# Patient Record
Sex: Male | Born: 1940
Health system: Southern US, Community
[De-identification: ages and names within clinical notes are randomized; demographics above are authoritative.]

## PROBLEM LIST (undated history)

## (undated) ENCOUNTER — Ambulatory Visit: Admission: RE | Payer: Medicare Other | Source: Ambulatory Visit

## (undated) DIAGNOSIS — F528 Other sexual dysfunction not due to a substance or known physiological condition: Secondary | ICD-10-CM

## (undated) DIAGNOSIS — C4491 Basal cell carcinoma of skin, unspecified: Secondary | ICD-10-CM

## (undated) DIAGNOSIS — R001 Bradycardia, unspecified: Secondary | ICD-10-CM

## (undated) DIAGNOSIS — E669 Obesity, unspecified: Secondary | ICD-10-CM

## (undated) DIAGNOSIS — K922 Gastrointestinal hemorrhage, unspecified: Secondary | ICD-10-CM

## (undated) DIAGNOSIS — I2510 Atherosclerotic heart disease of native coronary artery without angina pectoris: Secondary | ICD-10-CM

## (undated) DIAGNOSIS — K5730 Diverticulosis of large intestine without perforation or abscess without bleeding: Secondary | ICD-10-CM

## (undated) DIAGNOSIS — R200 Anesthesia of skin: Secondary | ICD-10-CM

## (undated) DIAGNOSIS — K2270 Barrett's esophagus without dysplasia: Secondary | ICD-10-CM

## (undated) DIAGNOSIS — C44221 Squamous cell carcinoma of skin of unspecified ear and external auricular canal: Secondary | ICD-10-CM

## (undated) DIAGNOSIS — F329 Major depressive disorder, single episode, unspecified: Secondary | ICD-10-CM

## (undated) DIAGNOSIS — R011 Cardiac murmur, unspecified: Secondary | ICD-10-CM

## (undated) DIAGNOSIS — F411 Generalized anxiety disorder: Secondary | ICD-10-CM

## (undated) DIAGNOSIS — N410 Acute prostatitis: Secondary | ICD-10-CM

## (undated) DIAGNOSIS — I219 Acute myocardial infarction, unspecified: Secondary | ICD-10-CM

## (undated) DIAGNOSIS — N200 Calculus of kidney: Secondary | ICD-10-CM

## (undated) DIAGNOSIS — E785 Hyperlipidemia, unspecified: Secondary | ICD-10-CM

## (undated) DIAGNOSIS — F32A Depression, unspecified: Secondary | ICD-10-CM

## (undated) DIAGNOSIS — M1990 Unspecified osteoarthritis, unspecified site: Secondary | ICD-10-CM

## (undated) DIAGNOSIS — Z87442 Personal history of urinary calculi: Secondary | ICD-10-CM

## (undated) DIAGNOSIS — I639 Cerebral infarction, unspecified: Secondary | ICD-10-CM

## (undated) DIAGNOSIS — K219 Gastro-esophageal reflux disease without esophagitis: Secondary | ICD-10-CM

## (undated) HISTORY — DX: Generalized anxiety disorder: F41.1

## (undated) HISTORY — DX: Atherosclerotic heart disease of native coronary artery without angina pectoris: I25.10

## (undated) HISTORY — DX: Barrett's esophagus without dysplasia: K22.70

## (undated) HISTORY — PX: ESOPHAGOGASTRODUODENOSCOPY (EGD) WITH ESOPHAGEAL DILATION: SHX5812

## (undated) HISTORY — PX: BASAL CELL CARCINOMA EXCISION: SHX1214

## (undated) HISTORY — PX: URETERAL STENT PLACEMENT: SHX822

## (undated) HISTORY — DX: Diverticulosis of large intestine without perforation or abscess without bleeding: K57.30

## (undated) HISTORY — PX: UPPER GASTROINTESTINAL ENDOSCOPY: SHX188

## (undated) HISTORY — DX: Obesity, unspecified: E66.9

## (undated) HISTORY — DX: Hyperlipidemia, unspecified: E78.5

## (undated) HISTORY — DX: Gastrointestinal hemorrhage, unspecified: K92.2

## (undated) HISTORY — PX: LITHOTRIPSY: SUR834

## (undated) HISTORY — PX: SQUAMOUS CELL CARCINOMA EXCISION: SHX2433

## (undated) HISTORY — DX: Other sexual dysfunction not due to a substance or known physiological condition: F52.8

## (undated) HISTORY — PX: CATARACT EXTRACTION, BILATERAL: SHX1313

---
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
## 1969-08-30 HISTORY — PX: TONSILLECTOMY: SUR1361

## 1998-08-30 DIAGNOSIS — I219 Acute myocardial infarction, unspecified: Secondary | ICD-10-CM

## 1998-08-30 HISTORY — DX: Acute myocardial infarction, unspecified: I21.9

## 1998-09-06 HISTORY — PX: CORONARY ANGIOPLASTY WITH STENT PLACEMENT: SHX49

## 1998-12-26 ENCOUNTER — Encounter: Payer: Self-pay | Admitting: Internal Medicine

## 1998-12-26 LAB — CONVERTED CEMR LAB

## 2001-08-30 DIAGNOSIS — K2270 Barrett's esophagus without dysplasia: Secondary | ICD-10-CM

## 2001-08-30 DIAGNOSIS — K5730 Diverticulosis of large intestine without perforation or abscess without bleeding: Secondary | ICD-10-CM

## 2001-08-30 HISTORY — DX: Barrett's esophagus without dysplasia: K22.70

## 2001-08-30 HISTORY — DX: Diverticulosis of large intestine without perforation or abscess without bleeding: K57.30

## 2002-07-19 ENCOUNTER — Ambulatory Visit (HOSPITAL_COMMUNITY): Admission: RE | Admit: 2002-07-19 | Discharge: 2002-07-19 | Payer: Self-pay | Admitting: Gastroenterology

## 2002-07-19 ENCOUNTER — Encounter: Payer: Self-pay | Admitting: Gastroenterology

## 2002-07-19 ENCOUNTER — Encounter (INDEPENDENT_AMBULATORY_CARE_PROVIDER_SITE_OTHER): Payer: Self-pay | Admitting: *Deleted

## 2003-09-23 ENCOUNTER — Emergency Department (HOSPITAL_COMMUNITY): Admission: EM | Admit: 2003-09-23 | Discharge: 2003-09-23 | Payer: Self-pay | Admitting: Emergency Medicine

## 2004-08-07 ENCOUNTER — Ambulatory Visit: Payer: Self-pay | Admitting: Internal Medicine

## 2004-08-14 ENCOUNTER — Ambulatory Visit: Payer: Self-pay | Admitting: Internal Medicine

## 2004-08-21 ENCOUNTER — Ambulatory Visit: Payer: Self-pay | Admitting: Internal Medicine

## 2004-09-10 ENCOUNTER — Ambulatory Visit (HOSPITAL_COMMUNITY): Admission: RE | Admit: 2004-09-10 | Discharge: 2004-09-10 | Payer: Self-pay | Admitting: Urology

## 2004-10-02 ENCOUNTER — Ambulatory Visit: Payer: Self-pay | Admitting: Internal Medicine

## 2004-10-09 ENCOUNTER — Ambulatory Visit: Payer: Self-pay | Admitting: Internal Medicine

## 2005-01-29 ENCOUNTER — Ambulatory Visit: Payer: Self-pay | Admitting: Internal Medicine

## 2005-03-19 ENCOUNTER — Ambulatory Visit: Payer: Self-pay | Admitting: Internal Medicine

## 2005-04-09 ENCOUNTER — Ambulatory Visit: Payer: Self-pay | Admitting: Internal Medicine

## 2005-08-06 ENCOUNTER — Ambulatory Visit: Payer: Self-pay | Admitting: Internal Medicine

## 2005-08-13 ENCOUNTER — Ambulatory Visit: Payer: Self-pay | Admitting: Internal Medicine

## 2005-09-18 ENCOUNTER — Emergency Department (HOSPITAL_COMMUNITY): Admission: EM | Admit: 2005-09-18 | Discharge: 2005-09-18 | Payer: Self-pay | Admitting: Family Medicine

## 2005-10-29 ENCOUNTER — Ambulatory Visit: Payer: Self-pay | Admitting: Internal Medicine

## 2005-11-05 ENCOUNTER — Ambulatory Visit: Payer: Self-pay | Admitting: Internal Medicine

## 2006-03-11 ENCOUNTER — Ambulatory Visit: Payer: Self-pay | Admitting: Internal Medicine

## 2006-03-18 ENCOUNTER — Ambulatory Visit: Payer: Self-pay | Admitting: Internal Medicine

## 2006-06-10 ENCOUNTER — Ambulatory Visit: Payer: Self-pay | Admitting: Internal Medicine

## 2006-06-10 LAB — CONVERTED CEMR LAB
ALT: 27 units/L (ref 0–40)
AST: 25 units/L (ref 0–37)
Albumin: 3.8 g/dL (ref 3.5–5.2)
Alkaline Phosphatase: 77 units/L (ref 39–117)
BUN: 11 mg/dL (ref 6–23)
Bilirubin, Direct: 0.2 mg/dL (ref 0.0–0.3)
CO2: 27 meq/L (ref 19–32)
Calcium: 9.3 mg/dL (ref 8.4–10.5)
Chloride: 108 meq/L (ref 96–112)
Chol/HDL Ratio, serum: 4.4
Cholesterol: 132 mg/dL (ref 0–200)
Creatinine, Ser: 1 mg/dL (ref 0.4–1.5)
GFR calc non Af Amer: 80 mL/min
Glomerular Filtration Rate, Af Am: 97 mL/min/{1.73_m2}
Glucose, Bld: 96 mg/dL (ref 70–99)
HDL: 30.3 mg/dL — ABNORMAL LOW (ref >39.0)
LDL Cholesterol: 91 mg/dL (ref 0–99)
Potassium: 4.7 meq/L (ref 3.5–5.1)
Sodium: 140 meq/L (ref 135–145)
Total Bilirubin: 1.1 mg/dL (ref 0.3–1.2)
Total Protein: 6.4 g/dL (ref 6.0–8.3)
Triglyceride fasting, serum: 54 mg/dL (ref 0–149)
VLDL: 11 mg/dL (ref 0–40)

## 2006-12-02 ENCOUNTER — Ambulatory Visit: Payer: Self-pay | Admitting: Internal Medicine

## 2006-12-07 ENCOUNTER — Ambulatory Visit: Payer: Self-pay | Admitting: Gastroenterology

## 2006-12-07 LAB — CONVERTED CEMR LAB
Basophils Absolute: 0 10*3/uL (ref 0.0–0.1)
Basophils Relative: 0.7 % (ref 0.0–1.0)
Eosinophils Absolute: 0.1 10*3/uL (ref 0.0–0.6)
Eosinophils Relative: 1.3 % (ref 0.0–5.0)
HCT: 38.8 % — ABNORMAL LOW (ref 39.0–52.0)
Hemoglobin: 13.7 g/dL (ref 13.0–17.0)
Lymphocytes Relative: 27.1 % (ref 12.0–46.0)
MCHC: 35.3 g/dL (ref 30.0–36.0)
MCV: 86.4 fL (ref 78.0–100.0)
Monocytes Absolute: 0.7 10*3/uL (ref 0.2–0.7)
Monocytes Relative: 13.2 % — ABNORMAL HIGH (ref 3.0–11.0)
Neutro Abs: 3.1 10*3/uL (ref 1.4–7.7)
Neutrophils Relative %: 57.7 % (ref 43.0–77.0)
Platelets: 219 10*3/uL (ref 150–400)
RBC: 4.49 M/uL (ref 4.22–5.81)
RDW: 12.3 % (ref 11.5–14.6)
WBC: 5.3 10*3/uL (ref 4.5–10.5)

## 2006-12-29 ENCOUNTER — Ambulatory Visit: Payer: Self-pay | Admitting: Gastroenterology

## 2006-12-29 ENCOUNTER — Encounter (INDEPENDENT_AMBULATORY_CARE_PROVIDER_SITE_OTHER): Payer: Self-pay | Admitting: Specialist

## 2006-12-29 LAB — HM COLONOSCOPY: HM Colonoscopy: ABNORMAL

## 2007-01-27 ENCOUNTER — Ambulatory Visit: Payer: Self-pay | Admitting: Internal Medicine

## 2007-01-27 LAB — CONVERTED CEMR LAB
ALT: 20 units/L (ref 0–40)
AST: 20 units/L (ref 0–37)
Albumin: 3.5 g/dL (ref 3.5–5.2)
Alkaline Phosphatase: 74 units/L (ref 39–117)
BUN: 15 mg/dL (ref 6–23)
Basophils Absolute: 0 10*3/uL (ref 0.0–0.1)
Basophils Relative: 0.3 % (ref 0.0–1.0)
Bilirubin, Direct: 0.1 mg/dL (ref 0.0–0.3)
CO2: 26 meq/L (ref 19–32)
Calcium: 9 mg/dL (ref 8.4–10.5)
Chloride: 112 meq/L (ref 96–112)
Cholesterol: 185 mg/dL (ref 0–200)
Creatinine, Ser: 0.7 mg/dL (ref 0.4–1.5)
Eosinophils Absolute: 0.1 10*3/uL (ref 0.0–0.6)
Eosinophils Relative: 3.1 % (ref 0.0–5.0)
GFR calc Af Amer: 146 mL/min
GFR calc non Af Amer: 121 mL/min
Glucose, Bld: 96 mg/dL (ref 70–99)
HCT: 42.6 % (ref 39.0–52.0)
HDL: 33.1 mg/dL — ABNORMAL LOW (ref >39.0)
Hemoglobin: 14.9 g/dL (ref 13.0–17.0)
LDL Cholesterol: 133 mg/dL — ABNORMAL HIGH (ref 0–99)
Lymphocytes Relative: 33 % (ref 12.0–46.0)
MCHC: 35 g/dL (ref 30.0–36.0)
MCV: 87.1 fL (ref 78.0–100.0)
Monocytes Absolute: 0.6 10*3/uL (ref 0.2–0.7)
Monocytes Relative: 16.7 % — ABNORMAL HIGH (ref 3.0–11.0)
Neutro Abs: 1.6 10*3/uL (ref 1.4–7.7)
Neutrophils Relative %: 46.9 % (ref 43.0–77.0)
PSA: 1.63 ng/mL (ref 0.10–4.00)
Platelets: 201 10*3/uL (ref 150–400)
Potassium: 4.1 meq/L (ref 3.5–5.1)
RBC: 4.89 M/uL (ref 4.22–5.81)
RDW: 12.3 % (ref 11.5–14.6)
Sodium: 142 meq/L (ref 135–145)
Total Bilirubin: 0.9 mg/dL (ref 0.3–1.2)
Total CHOL/HDL Ratio: 5.6
Total Protein: 6.6 g/dL (ref 6.0–8.3)
Triglycerides: 93 mg/dL (ref 0–149)
VLDL: 19 mg/dL (ref 0–40)
WBC: 3.5 10*3/uL — ABNORMAL LOW (ref 4.5–10.5)

## 2007-02-07 ENCOUNTER — Ambulatory Visit: Payer: Self-pay | Admitting: Internal Medicine

## 2007-02-09 ENCOUNTER — Encounter: Payer: Self-pay | Admitting: Internal Medicine

## 2007-02-09 DIAGNOSIS — I2510 Atherosclerotic heart disease of native coronary artery without angina pectoris: Secondary | ICD-10-CM | POA: Insufficient documentation

## 2007-02-09 DIAGNOSIS — K2270 Barrett's esophagus without dysplasia: Secondary | ICD-10-CM | POA: Insufficient documentation

## 2007-02-09 DIAGNOSIS — E785 Hyperlipidemia, unspecified: Secondary | ICD-10-CM | POA: Insufficient documentation

## 2007-02-09 DIAGNOSIS — K5730 Diverticulosis of large intestine without perforation or abscess without bleeding: Secondary | ICD-10-CM

## 2007-02-09 DIAGNOSIS — Z87442 Personal history of urinary calculi: Secondary | ICD-10-CM | POA: Insufficient documentation

## 2007-02-09 DIAGNOSIS — I252 Old myocardial infarction: Secondary | ICD-10-CM

## 2007-02-09 HISTORY — DX: Hyperlipidemia, unspecified: E78.5

## 2007-05-31 ENCOUNTER — Telehealth: Payer: Self-pay | Admitting: Internal Medicine

## 2007-08-10 ENCOUNTER — Ambulatory Visit: Payer: Self-pay | Admitting: Internal Medicine

## 2007-08-11 LAB — CONVERTED CEMR LAB
ALT: 29 units/L (ref 0–53)
AST: 23 units/L (ref 0–37)
Albumin: 3.7 g/dL (ref 3.5–5.2)
Alkaline Phosphatase: 85 units/L (ref 39–117)
BUN: 14 mg/dL (ref 6–23)
Basophils Absolute: 0 10*3/uL (ref 0.0–0.1)
Basophils Relative: 0.6 % (ref 0.0–1.0)
Bilirubin, Direct: 0.2 mg/dL (ref 0.0–0.3)
CO2: 29 meq/L (ref 19–32)
Calcium: 9.3 mg/dL (ref 8.4–10.5)
Chloride: 105 meq/L (ref 96–112)
Cholesterol: 193 mg/dL (ref 0–200)
Creatinine, Ser: 1 mg/dL (ref 0.4–1.5)
Eosinophils Absolute: 0.1 10*3/uL (ref 0.0–0.6)
Eosinophils Relative: 3 % (ref 0.0–5.0)
GFR calc Af Amer: 97 mL/min
GFR calc non Af Amer: 80 mL/min
Glucose, Bld: 98 mg/dL (ref 70–99)
HCT: 45 % (ref 39.0–52.0)
HDL: 29.1 mg/dL — ABNORMAL LOW (ref >39.0)
Hemoglobin: 15.6 g/dL (ref 13.0–17.0)
LDL Cholesterol: 143 mg/dL — ABNORMAL HIGH (ref 0–99)
Lymphocytes Relative: 29.4 % (ref 12.0–46.0)
MCHC: 34.7 g/dL (ref 30.0–36.0)
MCV: 87.2 fL (ref 78.0–100.0)
Monocytes Absolute: 0.9 10*3/uL — ABNORMAL HIGH (ref 0.2–0.7)
Monocytes Relative: 17.9 % — ABNORMAL HIGH (ref 3.0–11.0)
Neutro Abs: 2.5 10*3/uL (ref 1.4–7.7)
Neutrophils Relative %: 49.1 % (ref 43.0–77.0)
Platelets: 221 10*3/uL (ref 150–400)
Potassium: 4.6 meq/L (ref 3.5–5.1)
RBC: 5.16 M/uL (ref 4.22–5.81)
RDW: 12.7 % (ref 11.5–14.6)
Sodium: 140 meq/L (ref 135–145)
Total Bilirubin: 0.9 mg/dL (ref 0.3–1.2)
Total CHOL/HDL Ratio: 6.6
Total Protein: 6.6 g/dL (ref 6.0–8.3)
Triglycerides: 104 mg/dL (ref 0–149)
VLDL: 21 mg/dL (ref 0–40)
WBC: 5 10*3/uL (ref 4.5–10.5)

## 2007-08-15 ENCOUNTER — Ambulatory Visit: Payer: Self-pay | Admitting: Internal Medicine

## 2007-10-12 ENCOUNTER — Ambulatory Visit: Payer: Self-pay | Admitting: Internal Medicine

## 2007-10-12 LAB — CONVERTED CEMR LAB
ALT: 24 units/L (ref 0–53)
Albumin: 3.5 g/dL (ref 3.5–5.2)
Alkaline Phosphatase: 86 units/L (ref 39–117)
Cholesterol: 134 mg/dL (ref 0–200)
LDL Cholesterol: 85 mg/dL (ref 0–99)
Total CHOL/HDL Ratio: 4.9
Total Protein: 6.7 g/dL (ref 6.0–8.3)

## 2007-10-16 ENCOUNTER — Telehealth: Payer: Self-pay | Admitting: Internal Medicine

## 2008-02-27 ENCOUNTER — Ambulatory Visit: Payer: Self-pay | Admitting: Internal Medicine

## 2008-02-27 LAB — CONVERTED CEMR LAB
ALT: 22 units/L (ref 0–53)
Alkaline Phosphatase: 74 units/L (ref 39–117)
Basophils Absolute: 0 10*3/uL (ref 0.0–0.1)
Bilirubin, Direct: 0.1 mg/dL (ref 0.0–0.3)
CO2: 26 meq/L (ref 19–32)
Calcium: 8.8 mg/dL (ref 8.4–10.5)
Cholesterol: 129 mg/dL (ref 0–200)
GFR calc Af Amer: 87 mL/min
HDL: 30.8 mg/dL — ABNORMAL LOW (ref >39.0)
Hemoglobin: 15.1 g/dL (ref 13.0–17.0)
LDL Cholesterol: 84 mg/dL (ref 0–99)
Lymphocytes Relative: 30.4 % (ref 12.0–46.0)
MCHC: 34.9 g/dL (ref 30.0–36.0)
Monocytes Relative: 17.9 % — ABNORMAL HIGH (ref 3.0–12.0)
Neutro Abs: 1.8 10*3/uL (ref 1.4–7.7)
Nitrite: NEGATIVE
Platelets: 171 10*3/uL (ref 150–400)
Potassium: 3.8 meq/L (ref 3.5–5.1)
RDW: 12.9 % (ref 11.5–14.6)
Sodium: 143 meq/L (ref 135–145)
Total Bilirubin: 1 mg/dL (ref 0.3–1.2)
Total CHOL/HDL Ratio: 4.2
Triglycerides: 72 mg/dL (ref 0–149)
Urobilinogen, UA: 1
VLDL: 14 mg/dL (ref 0–40)

## 2008-03-06 ENCOUNTER — Ambulatory Visit: Payer: Self-pay | Admitting: Internal Medicine

## 2008-08-20 ENCOUNTER — Ambulatory Visit: Payer: Self-pay | Admitting: Internal Medicine

## 2008-08-20 LAB — CONVERTED CEMR LAB
Albumin: 3.6 g/dL (ref 3.5–5.2)
Cholesterol: 152 mg/dL (ref 0–200)
LDL Cholesterol: 99 mg/dL (ref 0–99)
Total CHOL/HDL Ratio: 4.7
Total Protein: 6.5 g/dL (ref 6.0–8.3)
Triglycerides: 104 mg/dL (ref 0–149)
VLDL: 21 mg/dL (ref 0–40)

## 2008-08-22 ENCOUNTER — Ambulatory Visit: Payer: Self-pay | Admitting: Internal Medicine

## 2008-08-22 DIAGNOSIS — F528 Other sexual dysfunction not due to a substance or known physiological condition: Secondary | ICD-10-CM

## 2008-08-22 DIAGNOSIS — F411 Generalized anxiety disorder: Secondary | ICD-10-CM

## 2008-08-22 HISTORY — DX: Generalized anxiety disorder: F41.1

## 2008-08-22 HISTORY — DX: Other sexual dysfunction not due to a substance or known physiological condition: F52.8

## 2008-09-25 ENCOUNTER — Encounter: Payer: Self-pay | Admitting: Internal Medicine

## 2008-10-17 ENCOUNTER — Telehealth: Payer: Self-pay | Admitting: Internal Medicine

## 2008-11-22 ENCOUNTER — Encounter (INDEPENDENT_AMBULATORY_CARE_PROVIDER_SITE_OTHER): Payer: Self-pay | Admitting: *Deleted

## 2009-02-13 ENCOUNTER — Ambulatory Visit: Payer: Self-pay | Admitting: Internal Medicine

## 2009-02-13 LAB — CONVERTED CEMR LAB
ALT: 22 units/L (ref 0–53)
AST: 23 units/L (ref 0–37)
Albumin: 3.7 g/dL (ref 3.5–5.2)
Calcium: 8.8 mg/dL (ref 8.4–10.5)
GFR calc non Af Amer: 89.88 mL/min (ref >60)
HDL: 34.9 mg/dL — ABNORMAL LOW (ref >39.00)
Sodium: 140 meq/L (ref 135–145)
Total Protein: 6.8 g/dL (ref 6.0–8.3)
Triglycerides: 88 mg/dL (ref 0.0–149.0)

## 2009-02-20 ENCOUNTER — Ambulatory Visit: Payer: Self-pay | Admitting: Internal Medicine

## 2009-02-26 ENCOUNTER — Telehealth: Payer: Self-pay | Admitting: Internal Medicine

## 2009-03-05 ENCOUNTER — Telehealth: Payer: Self-pay | Admitting: Internal Medicine

## 2009-04-18 ENCOUNTER — Ambulatory Visit: Payer: Self-pay | Admitting: Internal Medicine

## 2009-04-18 LAB — CONVERTED CEMR LAB
Albumin: 3.7 g/dL (ref 3.5–5.2)
Alkaline Phosphatase: 78 units/L (ref 39–117)
Basophils Absolute: 0 10*3/uL (ref 0.0–0.1)
Chloride: 115 meq/L — ABNORMAL HIGH (ref 96–112)
Cholesterol: 135 mg/dL (ref 0–200)
Creatinine, Ser: 0.9 mg/dL (ref 0.4–1.5)
Eosinophils Absolute: 0.1 10*3/uL (ref 0.0–0.7)
Hemoglobin: 14.9 g/dL (ref 13.0–17.0)
LDL Cholesterol: 87 mg/dL (ref 0–99)
Leukocytes, UA: NEGATIVE
Lymphocytes Relative: 29.4 % (ref 12.0–46.0)
Lymphs Abs: 1.3 10*3/uL (ref 0.7–4.0)
MCHC: 34 g/dL (ref 30.0–36.0)
Neutro Abs: 2.3 10*3/uL (ref 1.4–7.7)
Nitrite: NEGATIVE
PSA: 1.38 ng/mL (ref 0.10–4.00)
Potassium: 4.3 meq/L (ref 3.5–5.1)
RDW: 12.4 % (ref 11.5–14.6)
Sodium: 145 meq/L (ref 135–145)
Specific Gravity, Urine: >=1.03 (ref 1.000–1.030)
Triglycerides: 70 mg/dL (ref 0.0–149.0)
Urobilinogen, UA: 1 (ref 0.0–1.0)

## 2009-04-22 ENCOUNTER — Ambulatory Visit: Payer: Self-pay | Admitting: Internal Medicine

## 2009-08-25 ENCOUNTER — Telehealth: Payer: Self-pay | Admitting: Internal Medicine

## 2009-09-18 ENCOUNTER — Telehealth: Payer: Self-pay | Admitting: Gastroenterology

## 2009-10-01 ENCOUNTER — Encounter (INDEPENDENT_AMBULATORY_CARE_PROVIDER_SITE_OTHER): Payer: Self-pay | Admitting: *Deleted

## 2009-10-28 ENCOUNTER — Ambulatory Visit: Payer: Self-pay | Admitting: Internal Medicine

## 2009-10-28 LAB — CONVERTED CEMR LAB
AST: 24 units/L (ref 0–37)
Albumin: 3.7 g/dL (ref 3.5–5.2)
Alkaline Phosphatase: 77 units/L (ref 39–117)
Cholesterol: 146 mg/dL (ref 0–200)
Total Protein: 7 g/dL (ref 6.0–8.3)
Triglycerides: 97 mg/dL (ref 0.0–149.0)

## 2009-11-03 ENCOUNTER — Ambulatory Visit: Payer: Self-pay | Admitting: Internal Medicine

## 2009-11-06 ENCOUNTER — Encounter (INDEPENDENT_AMBULATORY_CARE_PROVIDER_SITE_OTHER): Payer: Self-pay | Admitting: *Deleted

## 2009-11-07 ENCOUNTER — Ambulatory Visit: Payer: Self-pay | Admitting: Gastroenterology

## 2009-11-21 ENCOUNTER — Ambulatory Visit: Payer: Self-pay | Admitting: Gastroenterology

## 2009-11-27 ENCOUNTER — Encounter: Payer: Self-pay | Admitting: Gastroenterology

## 2009-12-26 ENCOUNTER — Encounter: Payer: Self-pay | Admitting: Internal Medicine

## 2010-01-21 ENCOUNTER — Encounter: Payer: Self-pay | Admitting: Internal Medicine

## 2010-01-27 ENCOUNTER — Ambulatory Visit: Payer: Self-pay | Admitting: Internal Medicine

## 2010-01-28 ENCOUNTER — Encounter: Payer: Self-pay | Admitting: Internal Medicine

## 2010-02-23 ENCOUNTER — Telehealth: Payer: Self-pay | Admitting: Family Medicine

## 2010-03-09 ENCOUNTER — Ambulatory Visit: Payer: Self-pay | Admitting: Internal Medicine

## 2010-03-09 LAB — CONVERTED CEMR LAB
ALT: 23 units/L (ref 0–53)
AST: 21 units/L (ref 0–37)
Bilirubin, Direct: 0.2 mg/dL (ref 0.0–0.3)
Total Bilirubin: 0.8 mg/dL (ref 0.3–1.2)

## 2010-03-20 ENCOUNTER — Ambulatory Visit: Payer: Self-pay | Admitting: Internal Medicine

## 2010-03-20 DIAGNOSIS — E669 Obesity, unspecified: Secondary | ICD-10-CM

## 2010-03-20 HISTORY — DX: Obesity, unspecified: E66.9

## 2010-09-14 ENCOUNTER — Ambulatory Visit: Admit: 2010-09-14 | Payer: Self-pay | Admitting: Internal Medicine

## 2010-09-29 NOTE — Miscellaneous (Signed)
Summary: [facility]  Clinical Lists Changes  Allergies: Added new allergy or adverse reaction of LIPITOR

## 2010-09-29 NOTE — Letter (Signed)
Summary: Patient Notice-[facility]  [address] [place], [place] [address]   Phone: [phone]  Fax: [phone]        November 27, 2009 MRN: [id number]    KAINOA SWOBODA [address] [place], [place]  [address]    Dear Mr. Bernabei,  I am pleased to inform you that the biopsies taken during your recent endoscopic examination did not show any evidence of cancer upon pathologic examination.  However, your biopsies indicate you have a condition known as Barrett's esophagus. While not cancer, it is pre-cancerous (can progress to cancer) and needs to be monitored with repeat endoscopic examination and biopsies.  Fortunately, it is quite rare that this develops into cancer, but careful monitoring of the condition along with taking your medication as prescribed is important in reducing the risk of developing cancer.  It is my recommendation that you have a repeat upper gastrointestinal endoscopic examination in 3 years.  Continue with treatment plan as outlined the day of your exam.  Please call us if you have or develop heartburn, reflux symptoms, any swallowing problems, or if you have questions about your condition that have not been fully answered at this time.  Sincerely,  Meryl Dare MD [facility]  This letter has been electronically signed by your physician.  Appended Document: Patient Notice-Barrett's Esopghagus Letter mailed 4.1.11

## 2010-09-29 NOTE — Assessment & Plan Note (Signed)
Summary: 6 MNTH ROV//SLM/PT RESCD FROM BUMP//CCM   Vital Signs:  Patient profile:   70 year old male Weight:      205 pounds Temp:     97.3 degrees F oral Pulse rate:   54 / minute Pulse rhythm:   regular Resp:     14 per minute BP sitting:   118 / 70  (left arm) Cuff size:   regular  Vitals Entered By: Gladis Riffle, RN (November 03, 2009 3:25 PM) CC: 6 month rov, c/o myalgia from lipitor--labs done Is Patient Diabetic? No   CC:  6 month rov and c/o myalgia from lipitor--labs done.  History of Present Illness:  Follow-Up Visit      This is a 70 year old man who presents for Follow-up visit.  The patient denies chest pain and palpitations.  Since the last visit the patient notes no new problems or concerns.  The patient reports taking meds as prescribed.  When questioned about possible medication side effects, the patient notes muscle aches.  --- says leg pain---associated with lipitor. he has done his own trial of stopping and starting meds on his own and is onvinced that meds are the cause  All other systems reviewed and were negative   Preventive Screening-Counseling & Management  Alcohol-Tobacco     Smoking Status: quit     Year Quit: 1994  Current Medications (verified): 1)  Alprazolam 0.25 Mg Tabs (Alprazolam) .... Take 1 Tablet By Mouth Once A Day 2)  Aspir-81 81 Mg Tbec (Aspirin) .... Take 1 Tablet By Mouth Once A Day 3)  Atenolol 25 Mg Tabs (Atenolol) .... Take 1/2 Tablet By Mouth Once A Day 4)  Cialis 20 Mg Tabs (Tadalafil) .Marland Kitchen.. 1 Tablet Every Other Day As Needed For Erectile Dysfunction 5)  Omeprazole 20 Mg Cpdr (Omeprazole) .... Take 1 Capsule By Mouth Every Morning 6)  Lipitor 20 Mg Tabs (Atorvastatin Calcium) .... Take 1 Tab By Mouth Daily or As Directed  Allergies: 1)  ! Zocor (Simvastatin)  Physical Exam  General:  Well-developed,well-nourished,in no acute distress; alert,appropriate and cooperative throughout examination Head:  normocephalic and atraumatic.    Eyes:  pupils equal and pupils round.   Neck:  No deformities, masses, or tenderness noted. Chest Wall:  No deformities, masses, tenderness or gynecomastia noted. Lungs:  Normal respiratory effort, chest expands symmetrically. Lungs are clear to auscultation, no crackles or wheezes. Heart:  Normal rate and regular rhythm. S1 and S2 normal without gallop, murmur, click, rub or other extra sounds. Abdomen:  soft and non-tender.   Msk:  No deformity or scoliosis noted of thoracic or lumbar spine.   Neurologic:  cranial nerves II-XII intact and gait normal.   Skin:  turgor normal and color normal.   Psych:  memory intact for recent and remote and normally interactive.     Impression & Recommendations:  Problem # 1:  HYPERLIPIDEMIA (ICD-272.4) controlled but having side effects on meds see pt instructions The following medications were removed from the medication list:    Lipitor 20 Mg Tabs (Atorvastatin calcium) .Marland Kitchen... Take 1 tab by mouth daily or as directed His updated medication list for this problem includes:    Livalo 2 Mg Tabs (Pitavastatin calcium) .Marland Kitchen... 1/2 by mouth every other day (he is taking 1/2 every other day)  Labs Reviewed: SGOT: 24 (10/28/2009)   SGPT: 23 (10/28/2009)   HDL:43.00 (10/28/2009), 33.80 (04/18/2009)  LDL:84 (10/28/2009), 87 (04/18/2009)  Chol:146 (10/28/2009), 135 (04/18/2009)  Trig:97.0 (10/28/2009), 70.0 (  04/18/2009)  Problem # 2:  CORONARY ARTERY DISEASE (ICD-414.00) risk factor modification His updated medication list for this problem includes:    Aspir-81 81 Mg Tbec (Aspirin) .Marland Kitchen... Take 1 tablet by mouth once a day    Atenolol 25 Mg Tabs (Atenolol) .Marland Kitchen... Take 1/2 tablet by mouth once a day  Labs Reviewed: Chol: 146 (10/28/2009)   HDL: 43.00 (10/28/2009)   LDL: 84 (10/28/2009)   TG: 97.0 (10/28/2009)  Problem # 3:  ERECTILE DYSFUNCTION (ICD-302.72) he may consider a pump device His updated medication list for this problem includes:    Cialis 20 Mg  Tabs (Tadalafil) .Marland Kitchen... 1 tablet every other day as needed for erectile dysfunction  Complete Medication List: 1)  Alprazolam 0.25 Mg Tabs (Alprazolam) .... Take 1 tablet by mouth once a day 2)  Aspir-81 81 Mg Tbec (Aspirin) .... Take 1 tablet by mouth once a day 3)  Atenolol 25 Mg Tabs (Atenolol) .... Take 1/2 tablet by mouth once a day 4)  Cialis 20 Mg Tabs (Tadalafil) .Marland Kitchen.. 1 tablet every other day as needed for erectile dysfunction 5)  Omeprazole 20 Mg Cpdr (Omeprazole) .... Take 1 capsule by mouth every morning 6)  Livalo 2 Mg Tabs (Pitavastatin calcium) .... 1/2 by mouth every other day  Patient Instructions: 1)  stop lipitor for  5 weeks then start livalo 2 mg 1/2 by mouth every other day

## 2010-09-29 NOTE — Assessment & Plan Note (Signed)
Summary: 6 WK F/U // RS/pt rescd from bump//ccm   Vital Signs:  Patient profile:   70 year old male Weight:      210.5 pounds BMI:     32.12 Temp:     97.7 degrees F oral Pulse rate:   60 / minute Pulse rhythm:   regular Resp:     12 per minute BP sitting:   112 / 70  (left arm) Cuff size:   regular  Vitals Entered By: Gladis Riffle, RN (March 20, 2010 7:53 AM) CC: 6 week rov, labs done--c/o foot cramping and thinks may be from simvastatin Is Patient Diabetic? No   CC:  6 week rov and labs done--c/o foot cramping and thinks may be from simvastatin.  History of Present Illness:  Follow-Up Visit      This is a 70 year old Jose [name] who presents for Follow-up visit.  The patient denies chest pain and palpitations.  Since the last visit the patient notes no new problems or concerns.  The patient reports taking meds as prescribed.  When questioned about possible medication side effects, the patient notes none.    All other systems reviewed and were negative   Preventive Screening-Counseling & Management  Alcohol-Tobacco     Smoking Status: quit     Year Quit: 1994  Current Problems (verified): 1)  Erectile Dysfunction  (ICD-302.72) 2)  Anxiety  (ICD-300.00) 3)  Physical Examination  (ICD-V70.0) 4)  Barrett's Esophagus  (ICD-530.85) 5)  Nephrolithiasis, Hx of  (ICD-V13.01) 6)  Myocardial Infarction, Hx of  (ICD-412) 7)  Hyperlipidemia  (ICD-272.4) 8)  Diverticulosis, Colon  (ICD-562.10) 9)  Coronary Artery Disease  (ICD-414.00)  Current Medications (verified): 1)  Alprazolam 0.25 Mg Tabs (Alprazolam) .... Take 1 Tablet By Mouth Once A Day 2)  Aspir-81 81 Mg Tbec (Aspirin) .... Take 1 Tablet By Mouth Once A Day 3)  Atenolol 25 Mg Tabs (Atenolol) .... Take 1/2 Tablet By Mouth Once A Day 4)  Cialis 20 Mg Tabs (Tadalafil) .Marland Kitchen.. 1 Tablet Every Other Day As Needed For Erectile Dysfunction 5)  Omeprazole 20 Mg Cpdr (Omeprazole) .... Take 1 Capsule By Mouth Every Morning 6)  Simvastatin 20  Mg Tabs (Simvastatin) .Marland Kitchen.. 1 By Mouth At Bedtime  Allergies: 1)  ! Lipitor  Past History:  Past Medical History: Last updated: 08-28-07 Coronary artery disease Diverticulosis, colon Hyperlipidemia Myocardial infarction, hx of-stent Nephrolithiasis, hx of Barrett's esophagus hiatal hernia Anxiety  Past Surgical History: Last updated: 02/09/2007 lithotripsy T & A  Family History: Last updated: 08/28/07 mother deceased with "blood clot" father deceased - alcohol liver disease brother CABG  Social History: Last updated: 04/22/2009  Married second time.  Former Smoker Retired works 3 days weekly at [employer]  Risk Factors: Smoking Status: quit (03/20/2010)  Physical Exam  General:  alert and well-developed.   Head:  normocephalic and atraumatic.   Eyes:  pupils equal and pupils round.   Ears:  R ear normal and L ear normal.   Neck:  No deformities, masses, or tenderness noted. Lungs:  normal respiratory effort and no intercostal retractions.   Heart:  normal rate and regular rhythm.   Abdomen:  soft and non-tender.   Msk:  No deformity or scoliosis noted of thoracic or lumbar spine.   Neurologic:  alert & oriented X3 and cranial nerves II-XII intact.   Skin:  turgor normal and color normal.   Psych:  memory intact for recent and remote and normally interactive.  Impression & Recommendations:  Problem # 1:  CORONARY ARTERY DISEASE (ICD-414.00) no sxs continue current medications  His updated medication list for this problem includes:    Aspir-81 81 Mg Tbec (Aspirin) .Marland Kitchen... Take 1 tablet by mouth once a day    Atenolol 25 Mg Tabs (Atenolol) .Marland Kitchen... Take 1/2 tablet by mouth once a day  Labs Reviewed: Chol: 157 (03/09/2010)   HDL: 36.00 (03/09/2010)   LDL: 103 (03/09/2010)   TG: 88.0 (03/09/2010)  Problem # 2:  HYPERLIPIDEMIA (ICD-272.4) controlled continue current medications  His updated medication list for this problem includes:    Simvastatin 20 Mg  Tabs (Simvastatin) .Marland Kitchen... 1 by mouth at bedtime  Labs Reviewed: SGOT: 21 (03/09/2010)   SGPT: 23 (03/09/2010)   HDL:36.00 (03/09/2010), 43.00 (10/28/2009)  LDL:103 (03/09/2010), 84 (16/05/9603)  Chol:157 (03/09/2010), 146 (10/28/2009)  Trig:88.0 (03/09/2010), 97.0 (10/28/2009)  Problem # 3:  BARRETT'S ESOPHAGUS (ICD-530.85) no sxs continue PPI patient wants all Rx for 90 days---printed prescriptions  Problem # 5:  OBESITY (ICD-278.00) discussed weight loss at length he needs to eat less and exercise more discussed the requirement for a negative calorie balance  Complete Medication List: 1)  Alprazolam 0.25 Mg Tabs (Alprazolam) .... Take 1 tablet by mouth once a day 2)  Aspir-81 81 Mg Tbec (Aspirin) .... Take 1 tablet by mouth once a day 3)  Atenolol 25 Mg Tabs (Atenolol) .... Take 1/2 tablet by mouth once a day 4)  Cialis 20 Mg Tabs (Tadalafil) .Marland Kitchen.. 1 tablet every other day as needed for erectile dysfunction 5)  Omeprazole 20 Mg Cpdr (Omeprazole) .... Take 1 capsule by mouth every morning 6)  Simvastatin 20 Mg Tabs (Simvastatin) .Marland Kitchen.. 1 by mouth at bedtime  Patient Instructions: 1)  Please schedule a follow-up appointment in 6 months. 2)  lipids 272.4 3)  liver 995.2 4)  bmet--995.2 Prescriptions: SIMVASTATIN 20 MG TABS (SIMVASTATIN) 1 by mouth at bedtime  #90 x 3   Entered and Authorized by:   Birdie Sons MD   Signed by:   Birdie Sons MD on 03/20/2010   Method used:   Print then Give to Patient   RxID:   [id number] ATENOLOL 25 MG TABS (ATENOLOL) Take 1/2 tablet by mouth once a day  #90 x 3   Entered and Authorized by:   Birdie Sons MD   Signed by:   Birdie Sons MD on 03/20/2010   Method used:   Print then Give to Patient   RxID:   [id number] OMEPRAZOLE 20 MG CPDR (OMEPRAZOLE) Take 1 capsule by mouth every morning  #90 x 3   Entered and Authorized by:   Birdie Sons MD   Signed by:   Birdie Sons MD on 03/20/2010   Method used:   Print then Give to Patient    RxID:   [id number] ALPRAZOLAM 0.25 MG TABS (ALPRAZOLAM) Take 1 tablet by mouth once a day  #30 x 3   Entered and Authorized by:   Birdie Sons MD   Signed by:   Birdie Sons MD on 03/20/2010   Method used:   Print then Give to Patient   RxID:   [id number] CIALIS 20 MG TABS (TADALAFIL) 1 tablet every other day as needed for erectile dysfunction  #10 Tablet x 11   Entered and Authorized by:   Birdie Sons MD   Signed by:   Birdie Sons MD on 03/20/2010   Method used:   Print then Give to Patient   RxID:   [id number]

## 2010-09-29 NOTE — Letter (Signed)
Summary: EGD Instructions  [facility]  [address] [place], [place] [address]   Phone: [phone]  Fax: [phone]       ARVILLE POSTLEWAITE    07/20/1941    MRN: [id number]       Procedure Day /Date:  11/21/09  Friday     Arrival Time:  3:00pm     Procedure Time:  4:00pm     Location of Procedure:                    _x  _ [facility] (4th Floor)  PREPARATION FOR ENDOSCOPY   On  11/21/09    Friday   THE DAY OF THE PROCEDURE:  1.   No solid foods, milk or milk products are allowed after midnight the night before your procedure.  2.   Do not drink anything colored red or purple.  Avoid juices with pulp.  No orange juice.  3.  You may drink clear liquids until 2pm  , which is 2 hours before your procedure.                                                                                                CLEAR LIQUIDS INCLUDE: Water Jello Ice Popsicles Tea (sugar ok, no milk/cream) Powdered fruit flavored drinks Coffee (sugar ok, no milk/cream) Gatorade Juice: apple, white grape, white cranberry  Lemonade Clear bullion, consomm, broth Carbonated beverages (any kind) Strained chicken noodle soup Hard Candy   MEDICATION INSTRUCTIONS  Unless otherwise instructed, you should take regular prescription medications with a small sip of water as early as possible the morning of your procedure.              OTHER INSTRUCTIONS  You will need a responsible adult at least 68 years of age to accompany you and drive you home.   This person must remain in the waiting room during your procedure.  Wear loose fitting clothing that is easily removed.  Leave jewelry and other valuables at home.  However, you may wish to bring a book to read or an iPod/MP3 player to listen to music as you wait for your procedure to start.  Remove all body piercing jewelry and leave at home.  Total time from sign-in until discharge is approximately 2-3 hours.  You  should go home directly after your procedure and rest.  You can resume normal activities the day after your procedure.  The day of your procedure you should not:   Drive   Make legal decisions   Operate machinery   Drink alcohol   Return to work  You will receive specific instructions about eating, activities and medications before you leave.    The above instructions have been reviewed and explained to me by   Ezra Sites RN  November 07, 2009 4:35 PM    I fully understand and can verbalize these instructions _____________________________ Date _________

## 2010-09-29 NOTE — Progress Notes (Signed)
Summary: xanax refill  Phone Note Refill Request Message from:  Fax from Pharmacy on February 23, 2010 2:03 PM  Refills Requested: Medication #1:  ALPRAZOLAM 0.25 MG TABS Take 1 tablet by mouth once a day Initial call taken by: Kern Reap CMA Duncan Dull),  February 23, 2010 2:03 PM    Prescriptions: ALPRAZOLAM 0.25 MG TABS (ALPRAZOLAM) Take 1 tablet by mouth once a day  #30 x 3   Entered by:   Kern Reap CMA (AAMA)   Authorized by:   Roderick Pee MD   Signed by:   Kern Reap CMA (AAMA) on 02/23/2010   Method used:   Telephoned to ...       [facility]  [address] [phone]* (retail)       [address]       [place], [place]  [id number]       Ph: [phone] or [phone]       Fax: [phone]   RxID:   [id number]

## 2010-09-29 NOTE — Letter (Signed)
Summary: Previsit letter  [facility]  [address] [place], [place] [address]   Phone: [phone]  Fax: [phone]       10/01/2009 MRN: [id number]  Jose [name] [address] [place], [place]  [address]  Dear [name],  Welcome to the Gastroenterology Division at [facility].    You are scheduled to see a nurse for your pre-procedure visit on 11/07/2009 at 4:00pm on the 3rd floor at [facility], [address]  We ask that you try to arrive at our office 15 minutes prior to your appointment time to allow for check-in.  Your nurse visit will consist of discussing your medical and surgical history, your immediate family medical history, and your medications.    Please bring a complete list of all your medications or, if you prefer, bring the medication bottles and we will list them.  We will need to be aware of both prescribed and over the counter drugs.  We will need to know exact dosage information as well.  If you are on blood thinners (Coumadin, Plavix, Aggrenox, Ticlid, etc.) please call our office today/prior to your appointment, as we need to consult with your physician about holding your medication.   Please be prepared to read and sign documents such as consent forms, a financial agreement, and acknowledgement forms.  If necessary, and with your consent, a friend or relative is welcome to sit-in on the nurse visit with you.  Please bring your insurance card so that we may make a copy of it.  If your insurance requires a referral to see a specialist, please bring your referral form from your primary care physician.  No co-pay is required for this nurse visit.     If you cannot keep your appointment, please call [phone] to cancel or reschedule prior to your appointment date.  This allows [place] the opportunity to schedule an appointment for another patient in need of care.    Thank you for choosing [facility] for your medical needs.  We  appreciate the opportunity to care for you.  Please visit [place] at our website  to learn more about our practice.                     Sincerely.                                                                                                                   The Gastroenterology Division

## 2010-09-29 NOTE — Procedures (Signed)
Summary: Upper Endoscopy  Patient: Jose [name] Note: All result statuses are Final unless otherwise noted.  Tests: (1) Upper Endoscopy (EGD)   EGD Upper Endoscopy       DONE     [facility]     [address]     [place], [place]  [address]           ENDOSCOPY PROCEDURE REPORT           PATIENT:  Jose, [name]  MR#:  [id number]     BIRTHDATE:  04/17/1941, 68 yrs. old  GENDER:  male           ENDOSCOPIST:  Judie Petit T. Russella Dar, MD, [facility]           PROCEDURE DATE:  11/21/2009     PROCEDURE:  EGD with biopsy     ASA CLASS:  Class II     INDICATIONS:  h/o Barrett's Esophagus           MEDICATIONS:  Fentanyl 50 mcg IV, Versed 5 mg IV     TOPICAL ANESTHETIC:  Exactacain Spray           DESCRIPTION OF PROCEDURE:   After the risks benefits and     alternatives of the procedure were thoroughly explained, informed     consent was obtained.  The LB GIF-H180 [id number] endoscope was     introduced through the mouth and advanced to the second portion of     the duodenum, without limitations.  The instrument was slowly     withdrawn as the mucosa was fully examined.     <<PROCEDUREIMAGES>>           Barrett's esophagus was found. It was 33 - 37 cm in size. Multiple     biopsies were obtained and sent to pathology.  The stomach was     entered and closely examined. The pylorus, antrum, angularis, and     lesser curvature were well visualized, including a retroflexed     view of the cardia and fundus. The stomach wall was normally     distensable. The scope passed easily through the pylorus into the     duodenum. The duodenal bulb was normal in appearance, as was the     postbulbar duodenum. Retroflexed views revealed a hiatal hernia, 3     cm. The scope was then withdrawn from the patient and the     procedure completed.           COMPLICATIONS:  None           ENDOSCOPIC IMPRESSION:     1) 33 - 37 cm barrett's esophagus     2) Small hiatal hernia            RECOMMENDATIONS:     1) Anti-reflux regimen     2) Await pathology results     3) EGD in 3 years     4) continue PPI           Elveria Lauderbaugh T. Russella Dar, MD, [facility]           CC:  Lindley Magnus, MD           n.     Rosalie DoctorVenita Lick. Rhetta Cleek at 11/21/2009 05:04 PM           Precious Gilding, [id number]  Note: An exclamation mark (!) indicates a result that was not dispersed into the flowsheet. Document Creation Date: 11/21/2009 5:05 PM _______________________________________________________________________  Marland Kitchen  1) Order result status: Final Collection or observation date-time: 11/21/2009 16:58 Requested date-time:  Receipt date-time:  Reported date-time:  Referring Physician:   Ordering Physician: Claudette Head [phone]) Specimen Source:  Source: Launa Grill Order Number: [id number] Lab site:   Appended Document: Upper Endoscopy     Procedures Next Due Date:    EGD: 10/2012

## 2010-09-29 NOTE — Letter (Signed)
Summary: [facility]   Imported By: Maryln Gottron 03/12/2010 12:19:00  _____________________________________________________________________  External Attachment:    Type:   Image     Comment:   External Document

## 2010-09-29 NOTE — Progress Notes (Signed)
Summary: Schedule EGD   Phone Note Outgoing Call Call back at [facility] Phone [phone]   Call placed by: Harlow Mares CMA Duncan Dull),  September 18, 2009 2:13 PM Call placed to: Patient Summary of Call: Left message on patients machine to call back. Patient needs follow up on Barretts Initial call taken by: Harlow Mares CMA Duncan Dull),  September 18, 2009 2:13 PM  Follow-up for Phone Call        Left message on patients machine to call back. Follow-up by: Harlow Mares CMA Duncan Dull),  September 23, 2009 2:27 PM  Additional Follow-up for Phone Call Additional follow up Details #1::        patient scheduled for 11/14/2009. Additional Follow-up by: Harlow Mares CMA Duncan Dull),  October 01, 2009 3:29 PM

## 2010-09-29 NOTE — Assessment & Plan Note (Signed)
Summary: 3 MONTH FUP//CCM/pt rsc/cjr   Vital Signs:  Patient profile:   70 year old male Weight:      208 pounds Temp:     97.6 degrees F oral Pulse rate:   60 / minute Pulse rhythm:   regular Resp:     12 per minute BP sitting:   106 / 70  Vitals Entered By: Lynann Beaver CMA (Jan 27, 2010 11:22 AM) CC: rov, to discuss chol meds. Pain Assessment Patient in pain? no        CC:  rov and to discuss chol meds..  History of Present Illness: reports 3 weeks ago started livalo---the following dday he was at [facility] dizzy (describes vertigo). Went to ENT---pt decided to stop meds. Was evaluated by dr. Laretta Alstrom etiology identified. Associated with vertigo was nausea.   He reports chest tightness---started at time of changing tires. Went to cardiologist Chales Abrahams). Was told that heart was o.k. Had stress test 3-4 weeks prior to event---normal  All other systems reviewed and were negative   Current Medications (verified): 1)  Alprazolam 0.25 Mg Tabs (Alprazolam) .... Take 1 Tablet By Mouth Once A Day 2)  Aspir-81 81 Mg Tbec (Aspirin) .... Take 1 Tablet By Mouth Once A Day 3)  Atenolol 25 Mg Tabs (Atenolol) .... Take 1/2 Tablet By Mouth Once A Day 4)  Cialis 20 Mg Tabs (Tadalafil) .Marland Kitchen.. 1 Tablet Every Other Day As Needed For Erectile Dysfunction 5)  Omeprazole 20 Mg Cpdr (Omeprazole) .... Take 1 Capsule By Mouth Every Morning  Allergies (verified): 1)  ! Zocor (Simvastatin) 2)  ! Lipitor  Past History:  Past Medical History: Last updated: 17-Aug-2007 Coronary artery disease Diverticulosis, colon Hyperlipidemia Myocardial infarction, hx of-stent Nephrolithiasis, hx of Barrett's esophagus hiatal hernia Anxiety  Past Surgical History: Last updated: 02/09/2007 lithotripsy T & A  Family History: Last updated: 2007/08/17 mother deceased with "blood clot" father deceased - alcohol liver disease brother CABG  Social History: Last updated: 04/22/2009  Married  second time.  Former Smoker Retired works 3 days weekly at [employer]  Risk Factors: Smoking Status: quit (11/03/2009)  Review of Systems       All other systems reviewed and were negative   Physical Exam  General:  Well-developed,well-nourished,in no acute distress; alert,appropriate and cooperative throughout examination Head:  normocephalic and atraumatic.   Eyes:  pupils equal and pupils round.   Ears:  R ear normal and L ear normal.   Neck:  No deformities, masses, or tenderness noted. Chest Wall:  No deformities, masses, tenderness or gynecomastia noted. Abdomen:  soft and non-tender.  overweight Msk:  No deformity or scoliosis noted of thoracic or lumbar spine.   Neurologic:  cranial nerves II-XII intact and gait normal.   Skin:  turgor normal and color normal.     Impression & Recommendations:  Problem # 1:  HYPERLIPIDEMIA (ICD-272.4)  unable to tolerate livalo although I don't think his current sxs are related trial zocor The following medications were removed from the medication list:    Livalo 2 Mg Tabs (Pitavastatin calcium) .Marland Kitchen... 1/2 by mouth every other day His updated medication list for this problem includes:    Simvastatin 20 Mg Tabs (Simvastatin) .Marland Kitchen... 1 by mouth at bedtime  Orders: Prescription Created Electronically [phone])  Problem # 2:  MYOCARDIAL INFARCTION, HX OF (ICD-412) has f/u with dr Chales Abrahams His updated medication list for this problem includes:    Aspir-81 81 Mg Tbec (Aspirin) .Marland Kitchen... Take 1 tablet by mouth once a  day    Atenolol 25 Mg Tabs (Atenolol) .Marland Kitchen... Take 1/2 tablet by mouth once a day  Problem # 3:  CORONARY ARTERY DISEASE (ICD-414.00) f/u Dr. Chales Abrahams His updated medication list for this problem includes:    Aspir-81 81 Mg Tbec (Aspirin) .Marland Kitchen... Take 1 tablet by mouth once a day    Atenolol 25 Mg Tabs (Atenolol) .Marland Kitchen... Take 1/2 tablet by mouth once a day  Problem # 4:  CHEST PAIN (ICD-786.50) has resolved and had eval by dr  Chales Abrahams  Complete Medication List: 1)  Alprazolam 0.25 Mg Tabs (Alprazolam) .... Take 1 tablet by mouth once a day 2)  Aspir-81 81 Mg Tbec (Aspirin) .... Take 1 tablet by mouth once a day 3)  Atenolol 25 Mg Tabs (Atenolol) .... Take 1/2 tablet by mouth once a day 4)  Cialis 20 Mg Tabs (Tadalafil) .Marland Kitchen.. 1 tablet every other day as needed for erectile dysfunction 5)  Omeprazole 20 Mg Cpdr (Omeprazole) .... Take 1 capsule by mouth every morning 6)  Simvastatin 20 Mg Tabs (Simvastatin) .Marland Kitchen.. 1 by mouth at bedtime  Patient Instructions: 1)  see me 6 weeks 2)  lipids 272.4 3)  liver 995.2  Prescriptions: SIMVASTATIN 20 MG TABS (SIMVASTATIN) 1 by mouth at bedtime  #30 x 11   Entered and Authorized by:   Birdie Sons MD   Signed by:   Birdie Sons MD on 01/27/2010   Method used:   [facility]        [facility]  [address] [phone]* (retail)       [address]       [place], [place]  [id number]       Ph: [phone] or [phone]       Fax: [phone]   RxID:   [id number]

## 2010-10-15 ENCOUNTER — Encounter: Payer: Self-pay | Admitting: Internal Medicine

## 2010-10-16 ENCOUNTER — Other Ambulatory Visit: Payer: Self-pay | Admitting: *Deleted

## 2010-10-16 ENCOUNTER — Encounter: Payer: Self-pay | Admitting: Internal Medicine

## 2010-10-16 ENCOUNTER — Ambulatory Visit (INDEPENDENT_AMBULATORY_CARE_PROVIDER_SITE_OTHER): Payer: Medicare Other | Admitting: Internal Medicine

## 2010-10-16 DIAGNOSIS — Z125 Encounter for screening for malignant neoplasm of prostate: Secondary | ICD-10-CM

## 2010-10-16 DIAGNOSIS — R339 Retention of urine, unspecified: Secondary | ICD-10-CM

## 2010-10-16 DIAGNOSIS — K2270 Barrett's esophagus without dysplasia: Secondary | ICD-10-CM

## 2010-10-16 DIAGNOSIS — E785 Hyperlipidemia, unspecified: Secondary | ICD-10-CM

## 2010-10-16 DIAGNOSIS — F411 Generalized anxiety disorder: Secondary | ICD-10-CM

## 2010-10-16 DIAGNOSIS — Z131 Encounter for screening for diabetes mellitus: Secondary | ICD-10-CM

## 2010-10-16 DIAGNOSIS — F528 Other sexual dysfunction not due to a substance or known physiological condition: Secondary | ICD-10-CM

## 2010-10-16 DIAGNOSIS — I2510 Atherosclerotic heart disease of native coronary artery without angina pectoris: Secondary | ICD-10-CM

## 2010-10-16 DIAGNOSIS — Z0000 Encounter for general adult medical examination without abnormal findings: Secondary | ICD-10-CM

## 2010-10-16 DIAGNOSIS — Z136 Encounter for screening for cardiovascular disorders: Secondary | ICD-10-CM

## 2010-10-16 DIAGNOSIS — E669 Obesity, unspecified: Secondary | ICD-10-CM

## 2010-10-16 LAB — HEPATIC FUNCTION PANEL
ALT: 23 U/L (ref 0–53)
AST: 25 U/L (ref 0–37)
Albumin: 3.7 g/dL (ref 3.5–5.2)
Total Protein: 6.6 g/dL (ref 6.0–8.3)

## 2010-10-16 LAB — CBC WITH DIFFERENTIAL/PLATELET
Basophils Relative: 0.8 % (ref 0.0–3.0)
Eosinophils Absolute: 0.1 10*3/uL (ref 0.0–0.7)
MCHC: 34.5 g/dL (ref 30.0–36.0)
MCV: 88.4 fl (ref 78.0–100.0)
Monocytes Absolute: 0.7 10*3/uL (ref 0.1–1.0)
Neutro Abs: 2.1 10*3/uL (ref 1.4–7.7)
Neutrophils Relative %: 47.5 % (ref 43.0–77.0)
RBC: 5.15 Mil/uL (ref 4.22–5.81)
RDW: 13.5 % (ref 11.5–14.6)

## 2010-10-16 LAB — LIPID PANEL
Total CHOL/HDL Ratio: 5
Triglycerides: 93 mg/dL (ref 0.0–149.0)

## 2010-10-16 LAB — BASIC METABOLIC PANEL
CO2: 27 mEq/L (ref 19–32)
Chloride: 107 mEq/L (ref 96–112)
Creatinine, Ser: 1 mg/dL (ref 0.4–1.5)
Glucose, Bld: 90 mg/dL (ref 70–99)

## 2010-10-16 LAB — PSA: PSA: 1.72 ng/mL (ref 0.10–4.00)

## 2010-10-16 MED ORDER — ALPRAZOLAM 0.25 MG PO TABS: 0.2500 mg | ORAL_TABLET | Freq: Every evening | ORAL | Status: DC | PRN

## 2010-10-16 MED ORDER — TADALAFIL 20 MG PO TABS: 20.0000 mg | ORAL_TABLET | Freq: Every day | ORAL | Status: DC | PRN

## 2010-10-16 MED ORDER — OMEPRAZOLE 20 MG PO CPDR: 20.0000 mg | DELAYED_RELEASE_CAPSULE | Freq: Every day | ORAL | Status: DC

## 2010-10-16 MED ORDER — ATENOLOL 25 MG PO TABS: 25.0000 mg | ORAL_TABLET | Freq: Every day | ORAL | Status: DC

## 2010-10-16 NOTE — Assessment & Plan Note (Signed)
Noncompliant with medications. I'm concerned that his cholesterol was elevated. He understands the risks of not taking cholesterol medications. He states that simvastatin occasionally made his ankles hurt. I doubt that these symptoms are related.

## 2010-10-16 NOTE — Assessment & Plan Note (Signed)
This is a significant medical problem for him. I've advised aggressive weight loss. He is to follow a low-calorie diet. He should exercise at least one hour ever day.

## 2010-10-16 NOTE — Progress Notes (Signed)
  Subjective:    Jose [name] is a 70 y.o. male who presents for a welcome to Medicare exam. In addition he has multiple medical problems including, CAD, lipids, Barrett's esophagus. He has stopped zocor--"my ankles hurt"  Cardiac risk factors: dyslipidemia, male gender and obesity (BMI >= 30 kg/m2). Pt with known CAD  Cognitive review: pt denies any concerns, he is working full time without any known deficits  Depression Screen (Note: if answer to either of the following is "Yes", a more complete depression screening is indicated)  Q1: Over the past two weeks, have you felt down, depressed or hopeless? no Q2: Over the past two weeks, have you felt little interest or pleasure in doing things? no  Activities of Daily Living In your present state of health, do you have any difficulty performing the following activities?:  Preparing food and eating?: No Bathing yourself: No Getting dressed: No Using the toilet:No Moving around from place to place: No In the past year have you fallen or had a near fall?:No  Current exercise habits: The patient does not participate in regular exercise at present.  Dietary issues discussed: pt does not follow a low fat or low calorie diet  Hearing difficulties: No Safe in current home environment: yes  The following portions of the patient's history were reviewed and updated as appropriate: allergies, current medications, past family history, past medical history, past social history, past surgical history and problem list. Review of Systems  patient denies chest pain, shortness of breath, orthopnea. Denies lower extremity edema, abdominal pain, change in appetite, change in bowel movements. Patient denies rashes, musculoskeletal complaints. No other specific complaints in a complete review of systems.    Objective:     Vision by Snellen chart: right eye:20/20, left eye:20/20, with eye glasses Blood pressure 124/78, pulse 58, temperature 98.1 F  (36.7 C), temperature source Oral, height 5\' 8"  (1.727 m), weight 213 lb (96.616 kg). Body mass index is 32.39 kg/(m^2).  Well-developed male in no acute distress. HEENT exam atraumatic, normocephalic, extraocular muscles are intact. Conjunctivae are pink without exudate. Neck is supple without lymphadenopathy, thyromegaly, jugular venous distention. Chest is clear to auscultation without increased work of breathing. Cardiac exam S1-S2 are regular. The PMI is normal. No significant murmurs or gallops. Abdominal exam active bowel sounds, soft, nontender. No abdominal bruits. Extremities no clubbing cyanosis or edema. Peripheral pulses are normal without bruits. Neurologic exam alert and oriented without any motor or sensory deficits. Rectal exam normal tone prostate normal size without masses or asymmetry.   Assessment:     see AP     Plan:     During the course of the visit the patient was educated and counseled about appropriate screening and preventive services including:   Pneumococcal vaccine   Influenza vaccine  Prostate cancer screening  Colorectal cancer screening  Diabetes screening  Nutrition counseling   Patient Instructions (the written plan) was given to the patient.

## 2010-10-16 NOTE — Assessment & Plan Note (Signed)
Patient with known coronary artery disease.  He has no symptoms. He needs aggressive risk factor modification including weight loss, regular exercise. I'm concerned that his cholesterol will be elevated due to medication noncompliance.

## 2010-10-16 NOTE — Assessment & Plan Note (Signed)
He has no symptoms. Continue proton pump inhibitor.

## 2011-01-15 NOTE — Assessment & Plan Note (Signed)
[name]                         GASTROENTEROLOGY OFFICE NOTE   DARDEN, FLEMISTER                    MRN:          [id number]  DATE:12/07/2006                            DOB:          1941/05/16    REASON FOR REFERRAL:  Hematochezia.   HISTORY OF PRESENT ILLNESS:  Jose [name] is a very nice 70 year old  white male referred through the courtesy of Dr. Birdie Sons. He is a  former patient of Dr. Doreatha Martin [facility]'s. He has a history of GERD  complicated by an esophageal stricture and Barrett's esophagus. He  underwent endoscopy in July 2003 and again in November of 2003. Biopsies  of the distal esophagus both revealed intestinal metaplasia diagnostic  for Barrett's esophagus with the biopsy indeterminate for dysplasia. A  reactive atypia was favored. He has not had any problems with recurrent  dysphagia. He was previous maintained on Protonix, but has recently been  maintained on omeprazole with good control of his symptoms. He underwent  colonoscopy previously in July of 2003, which revealed diverticulosis as  well as internal and external hemorrhoids. About one week ago, he noted  the onset of moderate amounts of bright red blood per rectum for about 2  or 3 days. Occasionally the stool was noted be dark red. He did not note  any melena or clots. Over the past few days, his hematochezia has  completely abated. The hematochezia had always occurred with bowel  movements. He has no change in stool caliber, diarrhea, constipation,  abdominal pain or rectal pain. He denies any recent medication changes.   No family history of colon cancer, colon polyps or inflammatory bowel  disease.   A fingerstick hemoglobin at Dr. Cato Mulligan' office was 16.4 on April the  4th.   CURRENT MEDICATIONS:  Listed on the chart; updated and reviewed.   MEDICATION ALLERGIES:  ZOCOR LEADING TO LEG PAIN.   PAST MEDICAL HISTORY:  1. Status post myocardial infarction in  2000.  2. Gastroesophageal reflux disease complicated by peptic strictures      and Barrett's esophagus.  3. Diverticulosis.  4. Hemorrhoids.  5. History of kidney stones.  6. Seasonal allergic rhinitis.  7. Status post lithotripsy in 2006.  8. Status post tonsillectomy at age 56.   SOCIAL HISTORY:  Per the handwritten form.   REVIEW OF SYSTEMS:  Per the handwritten form.   PHYSICAL EXAMINATION:  In no acute distress. Height is 5 feet 10 inches.  Weight is 202.8 pounds. Blood pressure is 112/68, pulse 64 and regular.  HEENT: Anicteric sclerae. Oropharynx clear.  CHEST: Clear to auscultation bilaterally.  CARDIAC: Regular rate and rhythm without murmurs appreciated.  ABDOMEN: Soft and nontender, nondistended. Normoactive bowel sounds. No  palpable organomegaly, masses or hernias.  Digital rectal examination reveals no external or internal lesions.  There is Hemoccult negative brown stool in the vault.  EXTREMITIES: Without clubbing, cyanosis or edema.  NEUROLOGIC: Alert and oriented x3. Grossly nonfocal.   ASSESSMENT/PLAN:  1. Hematochezia.  Etiology unclear. Rule out internal hemorrhoids,      colorectal neoplasms or a low-grade diverticular bleed. Obtain a  CBC today. Risks, benefits and alternatives to colonoscopy with      possible biopsy and possible polypectomy and possible destruction      of internal hemorrhoids discussed with the patient and he consents      to proceed. This will be scheduled electively.  2. Gastroesophageal reflux disease complicated by Barrett's esophagus,      with his last biopsies showing reactive atypia versus a low-grade      dysplasia. He is to maintain all standard anti-reflux measures and      a proton pump inhibitor on a daily basis. Risks, benefits and      alternatives to upper endoscopy with biopsy discussed with the      patient and he consents to proceed.  This will be scheduled      electively.     Jose Lick. Russella Dar, MD, [facility]   Electronically Signed    MTS/MedQ  DD: 12/07/2006  DT: 12/07/2006  Job #: [id number]   cc:   Jose Mole. Swords, MD

## 2011-04-19 ENCOUNTER — Other Ambulatory Visit: Payer: Self-pay | Admitting: *Deleted

## 2011-04-19 DIAGNOSIS — F411 Generalized anxiety disorder: Secondary | ICD-10-CM

## 2011-04-19 MED ORDER — ALPRAZOLAM 0.25 MG PO TABS: 0.2500 mg | ORAL_TABLET | Freq: Every evening | ORAL | Status: DC | PRN

## 2011-07-09 ENCOUNTER — Other Ambulatory Visit: Payer: Self-pay | Admitting: *Deleted

## 2011-07-09 DIAGNOSIS — F411 Generalized anxiety disorder: Secondary | ICD-10-CM

## 2011-07-09 MED ORDER — ALPRAZOLAM 0.25 MG PO TABS: 0.2500 mg | ORAL_TABLET | Freq: Every evening | ORAL | Status: DC | PRN

## 2011-08-23 ENCOUNTER — Encounter: Payer: Self-pay | Admitting: Internal Medicine

## 2011-08-23 ENCOUNTER — Ambulatory Visit (INDEPENDENT_AMBULATORY_CARE_PROVIDER_SITE_OTHER): Payer: Medicare Other | Admitting: Internal Medicine

## 2011-08-23 VITALS — BP 124/70 | HR 60 | Temp 97.8°F | Ht 68.0 in | Wt 213.0 lb

## 2011-08-23 DIAGNOSIS — I2510 Atherosclerotic heart disease of native coronary artery without angina pectoris: Secondary | ICD-10-CM

## 2011-08-23 DIAGNOSIS — E669 Obesity, unspecified: Secondary | ICD-10-CM

## 2011-08-23 DIAGNOSIS — Z23 Encounter for immunization: Secondary | ICD-10-CM

## 2011-08-23 DIAGNOSIS — K2270 Barrett's esophagus without dysplasia: Secondary | ICD-10-CM

## 2011-08-23 DIAGNOSIS — E785 Hyperlipidemia, unspecified: Secondary | ICD-10-CM

## 2011-08-23 LAB — BASIC METABOLIC PANEL
CO2: 26 mEq/L (ref 19–32)
Chloride: 107 mEq/L (ref 96–112)
Potassium: 4.4 mEq/L (ref 3.5–5.1)
Sodium: 138 mEq/L (ref 135–145)

## 2011-08-23 LAB — CBC WITH DIFFERENTIAL/PLATELET
Basophils Relative: 0.9 % (ref 0.0–3.0)
Eosinophils Relative: 2.4 % (ref 0.0–5.0)
HCT: 46.3 % (ref 39.0–52.0)
Hemoglobin: 15.8 g/dL (ref 13.0–17.0)
Lymphs Abs: 1.3 10*3/uL (ref 0.7–4.0)
MCV: 88.6 fl (ref 78.0–100.0)
Monocytes Absolute: 0.6 10*3/uL (ref 0.1–1.0)
Neutro Abs: 2.1 10*3/uL (ref 1.4–7.7)
Neutrophils Relative %: 51.4 % (ref 43.0–77.0)
RBC: 5.22 Mil/uL (ref 4.22–5.81)
WBC: 4.1 10*3/uL — ABNORMAL LOW (ref 4.5–10.5)

## 2011-08-23 LAB — LIPID PANEL: Total CHOL/HDL Ratio: 5

## 2011-08-23 LAB — HEPATIC FUNCTION PANEL
ALT: 22 U/L (ref 0–53)
Alkaline Phosphatase: 80 U/L (ref 39–117)
Bilirubin, Direct: 0 mg/dL (ref 0.0–0.3)
Total Protein: 7.1 g/dL (ref 6.0–8.3)

## 2011-08-23 MED ORDER — ROSUVASTATIN CALCIUM 5 MG PO TABS: 2.5000 mg | ORAL_TABLET | Freq: Every day | ORAL | Status: DC

## 2011-08-23 NOTE — Assessment & Plan Note (Signed)
Discussed need for him to lose weight Recommended low calorie diet

## 2011-08-23 NOTE — Progress Notes (Signed)
Patient ID: Jose [name], male   DOB: [birth year], 70 y.o.   MRN: [id number] CPX  Past Medical History  Diagnosis Date  . ANXIETY 08/22/2008  . Barrett's esophagus 02/09/2007  . CORONARY ARTERY DISEASE 02/09/2007  . DIVERTICULOSIS, COLON 02/09/2007  . ERECTILE DYSFUNCTION 08/22/2008  . HYPERLIPIDEMIA 02/09/2007  . MYOCARDIAL INFARCTION, HX OF 02/09/2007  . NEPHROLITHIASIS, HX OF 02/09/2007  . OBESITY 03/20/2010    History   Social History  . Marital Status: Married    Spouse Name: N/A    Number of Children: N/A  . Years of Education: N/A   Occupational History  . Not on file.   Social History Main Topics  . Smoking status: Former Games developer  . Smokeless tobacco: Not on file  . Alcohol Use:   . Drug Use:   . Sexually Active:    Other Topics Concern  . Not on file   Social History Narrative  . No narrative on file    Past Surgical History  Procedure Date  . Lithotripsy   . Tonsilectomy, adenoidectomy, bilateral myringotomy and tubes     Family History  Problem Relation Age of Onset  . Alcohol abuse Father   . Liver disease Father   . Heart disease Brother     CABG    Allergies  Allergen Reactions  . Atorvastatin     REACTION: muscle weakness    Current Outpatient Prescriptions on File Prior to Visit  Medication Sig Dispense Refill  . ALPRAZolam (XANAX) 0.25 MG tablet Take 1 tablet (0.25 mg total) by mouth at bedtime as needed.  30 tablet  2  . aspirin 81 MG tablet Take 81 mg by mouth daily.        Marland Kitchen atenolol (TENORMIN) 25 MG tablet Take 1 tablet (25 mg total) by mouth daily.  90 tablet  3  . omeprazole (PRILOSEC) 20 MG capsule Take 1 capsule (20 mg total) by mouth daily.  90 capsule  3  . tadalafil (CIALIS) 20 MG tablet Take 1 tablet (20 mg total) by mouth daily as needed.  10 tablet  5     patient denies chest pain, shortness of breath, orthopnea. Denies lower extremity edema, abdominal pain, change in appetite, change in bowel movements. Patient denies  rashes, musculoskeletal complaints. No other specific complaints in a complete review of systems.   BP 124/70  Pulse 60  Temp(Src) 97.8 F (36.6 C) (Oral)  Ht 5\' 8"  (1.727 m)  Wt 213 lb (96.616 kg)  BMI 32.39 kg/m2  well-developed well-nourished male in no acute distress. HEENT exam atraumatic, normocephalic, neck supple without jugular venous distention. Chest clear to auscultation cardiac exam S1-S2 are regular. Abdominal exam overweight with bowel sounds, soft and nontender. Extremities no edema. Neurologic exam is alert with a normal gait.  A/P: Well visit- Health maint UTD

## 2011-08-23 NOTE — Assessment & Plan Note (Signed)
Check labs today.

## 2011-08-23 NOTE — Assessment & Plan Note (Signed)
Has regular f/u with GI Continue PPI

## 2011-08-23 NOTE — Assessment & Plan Note (Addendum)
No Sxs- Risk factor modification He understands not to take NTG within 36 hours of Cialis He has NEVER taken a ntg. He has had CAD/MI so keepingNTG "on hand" makes sense

## 2011-08-30 ENCOUNTER — Telehealth: Payer: Self-pay | Admitting: Internal Medicine

## 2011-08-30 DIAGNOSIS — F411 Generalized anxiety disorder: Secondary | ICD-10-CM

## 2011-08-30 NOTE — Telephone Encounter (Signed)
Pt req 6 month supply of ALPRAZolam (XANAX) 0.[facility] MG [facility] [address] or pt can pick up script.

## 2011-09-02 MED ORDER — ALPRAZOLAM 0.25 MG PO TABS: 0.2500 mg | ORAL_TABLET | Freq: Every evening | ORAL | Status: DC | PRN

## 2011-09-02 NOTE — Telephone Encounter (Signed)
rx called in

## 2011-10-01 DEATH — deceased

## 2011-12-07 ENCOUNTER — Other Ambulatory Visit: Payer: Self-pay | Admitting: *Deleted

## 2011-12-07 DIAGNOSIS — F411 Generalized anxiety disorder: Secondary | ICD-10-CM

## 2011-12-07 MED ORDER — ALPRAZOLAM 0.25 MG PO TABS: 0.2500 mg | ORAL_TABLET | Freq: Every evening | ORAL | Status: DC | PRN

## 2011-12-09 ENCOUNTER — Other Ambulatory Visit: Payer: Self-pay | Admitting: Internal Medicine

## 2011-12-14 DIAGNOSIS — E785 Hyperlipidemia, unspecified: Secondary | ICD-10-CM | POA: Diagnosis not present

## 2011-12-14 DIAGNOSIS — I2510 Atherosclerotic heart disease of native coronary artery without angina pectoris: Secondary | ICD-10-CM | POA: Diagnosis not present

## 2012-01-28 ENCOUNTER — Telehealth: Payer: Self-pay | Admitting: Internal Medicine

## 2012-01-28 NOTE — Telephone Encounter (Signed)
The above cell number is does not belong to the above patient.  The person that answers states that it is a wrong number.  I have left another message on the other number.

## 2012-01-31 NOTE — Telephone Encounter (Signed)
Left message for patient to call back  

## 2012-02-01 NOTE — Telephone Encounter (Signed)
i have left another message for the patient to call back if he still needs [place].

## 2012-03-17 ENCOUNTER — Telehealth: Payer: Self-pay | Admitting: Family Medicine

## 2012-03-17 DIAGNOSIS — F411 Generalized anxiety disorder: Secondary | ICD-10-CM

## 2012-03-17 MED ORDER — ALPRAZOLAM 0.25 MG PO TABS: 0.2500 mg | ORAL_TABLET | Freq: Every evening | ORAL | Status: DC | PRN

## 2012-03-17 NOTE — Telephone Encounter (Signed)
Pt requesting refills of Xanax. Need OV - I sched OV for 04-14-12. Please refill enough to get pt through. Then he would like to get refills in Aug til Dec. 2013, because that's when he'll come in for his physical. [facility] [address]

## 2012-03-17 NOTE — Telephone Encounter (Signed)
rx called in, pt aware 

## 2012-04-13 DIAGNOSIS — I2510 Atherosclerotic heart disease of native coronary artery without angina pectoris: Secondary | ICD-10-CM | POA: Diagnosis not present

## 2012-04-13 DIAGNOSIS — I252 Old myocardial infarction: Secondary | ICD-10-CM | POA: Diagnosis not present

## 2012-04-14 ENCOUNTER — Ambulatory Visit: Payer: Medicare Other | Admitting: Internal Medicine

## 2012-04-21 ENCOUNTER — Encounter: Payer: Self-pay | Admitting: Internal Medicine

## 2012-04-21 ENCOUNTER — Ambulatory Visit (INDEPENDENT_AMBULATORY_CARE_PROVIDER_SITE_OTHER): Payer: Medicare Other | Admitting: Internal Medicine

## 2012-04-21 VITALS — BP 122/84 | HR 52 | Temp 97.6°F | Wt 212.0 lb

## 2012-04-21 DIAGNOSIS — I2510 Atherosclerotic heart disease of native coronary artery without angina pectoris: Secondary | ICD-10-CM | POA: Diagnosis not present

## 2012-04-21 DIAGNOSIS — E785 Hyperlipidemia, unspecified: Secondary | ICD-10-CM | POA: Diagnosis not present

## 2012-04-21 DIAGNOSIS — F411 Generalized anxiety disorder: Secondary | ICD-10-CM

## 2012-04-21 DIAGNOSIS — K2270 Barrett's esophagus without dysplasia: Secondary | ICD-10-CM

## 2012-04-21 DIAGNOSIS — F528 Other sexual dysfunction not due to a substance or known physiological condition: Secondary | ICD-10-CM

## 2012-04-21 DIAGNOSIS — I10 Essential (primary) hypertension: Secondary | ICD-10-CM | POA: Diagnosis not present

## 2012-04-21 LAB — CBC WITH DIFFERENTIAL/PLATELET
Basophils Relative: 0.3 % (ref 0.0–3.0)
Eosinophils Absolute: 0.1 10*3/uL (ref 0.0–0.7)
Hemoglobin: 15.2 g/dL (ref 13.0–17.0)
Lymphs Abs: 1.1 10*3/uL (ref 0.7–4.0)
MCHC: 33.2 g/dL (ref 30.0–36.0)
MCV: 89.8 fl (ref 78.0–100.0)
Monocytes Absolute: 0.8 10*3/uL (ref 0.1–1.0)
Neutro Abs: 2.3 10*3/uL (ref 1.4–7.7)
RBC: 5.09 Mil/uL (ref 4.22–5.81)

## 2012-04-21 LAB — BASIC METABOLIC PANEL
CO2: 26 mEq/L (ref 19–32)
Chloride: 106 mEq/L (ref 96–112)
Glucose, Bld: 92 mg/dL (ref 70–99)
Sodium: 137 mEq/L (ref 135–145)

## 2012-04-21 LAB — LIPID PANEL
HDL: 41.3 mg/dL (ref >39.00)
LDL Cholesterol: 95 mg/dL (ref 0–99)
Total CHOL/HDL Ratio: 4
Triglycerides: 83 mg/dL (ref 0.0–149.0)

## 2012-04-21 LAB — HEPATIC FUNCTION PANEL
Albumin: 3.9 g/dL (ref 3.5–5.2)
Alkaline Phosphatase: 82 U/L (ref 39–117)
Total Bilirubin: 1 mg/dL (ref 0.3–1.2)

## 2012-04-21 MED ORDER — ALPRAZOLAM 0.25 MG PO TABS: 0.2500 mg | ORAL_TABLET | Freq: Every evening | ORAL | Status: DC | PRN

## 2012-04-21 MED ORDER — TADALAFIL 20 MG PO TABS: 20.0000 mg | ORAL_TABLET | Freq: Every day | ORAL | Status: DC | PRN

## 2012-04-21 NOTE — Assessment & Plan Note (Signed)
Has regular f/u with Dr. Russella Dar

## 2012-04-21 NOTE — Patient Instructions (Addendum)
Call your insurance company and see if they will cover shingles vaccine. If they will, call us and we will give it to you  

## 2012-04-21 NOTE — Progress Notes (Signed)
Patient ID: Jose [name], male   DOB: 1941/05/04, 71 y.o.   MRN: [id number]  Barrett's-- has regular f/u with GI. On ppi  Lipids-currently on meds  CAD-- no chest pain-  Past Medical History  Diagnosis Date  . ANXIETY 08/22/2008  . Barrett's esophagus 02/09/2007  . CORONARY ARTERY DISEASE 02/09/2007  . DIVERTICULOSIS, COLON 02/09/2007  . ERECTILE DYSFUNCTION 08/22/2008  . HYPERLIPIDEMIA 02/09/2007  . MYOCARDIAL INFARCTION, HX OF 02/09/2007  . NEPHROLITHIASIS, HX OF 02/09/2007  . OBESITY 03/20/2010    History   Social History  . Marital Status: Married    Spouse Name: N/A    Number of Children: N/A  . Years of Education: N/A   Occupational History  . Not on file.   Social History Main Topics  . Smoking status: Former Games developer  . Smokeless tobacco: Not on file  . Alcohol Use:   . Drug Use:   . Sexually Active:    Other Topics Concern  . Not on file   Social History Narrative  . No narrative on file    Past Surgical History  Procedure Date  . Lithotripsy   . Tonsilectomy, adenoidectomy, bilateral myringotomy and tubes     Family History  Problem Relation Age of Onset  . Alcohol abuse Father   . Liver disease Father   . Heart disease Brother     CABG    Allergies  Allergen Reactions  . Atorvastatin     REACTION: muscle weakness    Current Outpatient Prescriptions on File Prior to Visit  Medication Sig Dispense Refill  . ALPRAZolam (XANAX) 0.25 MG tablet Take 1 tablet (0.25 mg total) by mouth at bedtime as needed.  30 tablet  0  . aspirin 81 MG tablet Take 81 mg by mouth daily.        Marland Kitchen omeprazole (PRILOSEC) 20 MG capsule TAKE 1 CAPSULE DAILY (20 MG TOTAL)  90 capsule  2  . rosuvastatin (CRESTOR) 5 MG tablet Take 0.5 tablets (2.5 mg total) by mouth daily.  30 tablet  6  . tadalafil (CIALIS) 20 MG tablet Take 1 tablet (20 mg total) by mouth daily as needed.  10 tablet  5     patient denies chest pain, shortness of breath, orthopnea. Denies lower  extremity edema, abdominal pain, change in appetite, change in bowel movements. Patient denies rashes, musculoskeletal complaints. No other specific complaints in a complete review of systems.   BP 122/84  Pulse 52  Temp 97.6 F (36.4 C) (Oral)  Wt 212 lb (96.163 kg)  well-developed well-nourished male in no acute distress. HEENT exam atraumatic, normocephalic, neck supple without jugular venous distention. Chest clear to auscultation cardiac exam S1-S2 are regular. Abdominal exam overweight with bowel sounds, soft and nontender. Extremities no edema. Neurologic exam is alert with a normal gait.

## 2012-04-21 NOTE — Assessment & Plan Note (Signed)
No chest pain or dyspnea Continue risk factor modification

## 2012-04-21 NOTE — Assessment & Plan Note (Signed)
Lab Results  Component Value Date   CHOL 176 08/23/2011   HDL 39.10 08/23/2011   LDLCALC 122* 08/23/2011   TRIG 74.0 08/23/2011   CHOLHDL 5 08/23/2011   Continue meds- recheck today May need to increase meds to get ldl less than 100

## 2012-05-23 DIAGNOSIS — I2510 Atherosclerotic heart disease of native coronary artery without angina pectoris: Secondary | ICD-10-CM | POA: Diagnosis not present

## 2012-06-13 ENCOUNTER — Other Ambulatory Visit: Payer: Self-pay | Admitting: Internal Medicine

## 2012-06-14 NOTE — Telephone Encounter (Signed)
Ok to refill 

## 2012-06-14 NOTE — Telephone Encounter (Signed)
Dr Cato Mulligan gave pt a 30 day supply at last office visit.  Is this ok to refill?

## 2012-07-10 ENCOUNTER — Other Ambulatory Visit: Payer: Self-pay | Admitting: Internal Medicine

## 2012-08-11 ENCOUNTER — Telehealth: Payer: Self-pay | Admitting: Internal Medicine

## 2012-08-11 NOTE — Telephone Encounter (Signed)
Pt states he usually come in Christmas Eve for his CPX, but would like to come in 12/26 if Dr Cato Mulligan works that day. If not, he will have to wait until after new year.

## 2012-08-27 DIAGNOSIS — Z23 Encounter for immunization: Secondary | ICD-10-CM | POA: Diagnosis not present

## 2012-09-04 ENCOUNTER — Other Ambulatory Visit: Payer: Self-pay | Admitting: Internal Medicine

## 2012-10-14 ENCOUNTER — Other Ambulatory Visit: Payer: Self-pay | Admitting: Internal Medicine

## 2012-10-31 DIAGNOSIS — H6120 Impacted cerumen, unspecified ear: Secondary | ICD-10-CM | POA: Diagnosis not present

## 2012-10-31 DIAGNOSIS — J342 Deviated nasal septum: Secondary | ICD-10-CM | POA: Diagnosis not present

## 2012-11-01 ENCOUNTER — Encounter: Payer: Self-pay | Admitting: Gastroenterology

## 2013-01-25 ENCOUNTER — Telehealth: Payer: Self-pay | Admitting: Gastroenterology

## 2013-01-25 ENCOUNTER — Encounter (HOSPITAL_COMMUNITY): Payer: Self-pay | Admitting: Emergency Medicine

## 2013-01-25 ENCOUNTER — Inpatient Hospital Stay (HOSPITAL_COMMUNITY)
Admission: EM | Admit: 2013-01-25 | Discharge: 2013-01-27 | DRG: 392 | Disposition: A | Discharge status: Home / Self Care | Payer: Medicare Other | Attending: Gastroenterology | Admitting: Gastroenterology

## 2013-01-25 DIAGNOSIS — Z7982 Long term (current) use of aspirin: Secondary | ICD-10-CM

## 2013-01-25 DIAGNOSIS — Z9861 Coronary angioplasty status: Secondary | ICD-10-CM | POA: Diagnosis not present

## 2013-01-25 DIAGNOSIS — K5730 Diverticulosis of large intestine without perforation or abscess without bleeding: Secondary | ICD-10-CM | POA: Diagnosis not present

## 2013-01-25 DIAGNOSIS — E669 Obesity, unspecified: Secondary | ICD-10-CM | POA: Diagnosis present

## 2013-01-25 DIAGNOSIS — Z79899 Other long term (current) drug therapy: Secondary | ICD-10-CM | POA: Diagnosis not present

## 2013-01-25 DIAGNOSIS — Z8249 Family history of ischemic heart disease and other diseases of the circulatory system: Secondary | ICD-10-CM

## 2013-01-25 DIAGNOSIS — Z6379 Other stressful life events affecting family and household: Secondary | ICD-10-CM | POA: Diagnosis not present

## 2013-01-25 DIAGNOSIS — Z87891 Personal history of nicotine dependence: Secondary | ICD-10-CM

## 2013-01-25 DIAGNOSIS — I2510 Atherosclerotic heart disease of native coronary artery without angina pectoris: Secondary | ICD-10-CM | POA: Diagnosis present

## 2013-01-25 DIAGNOSIS — R1310 Dysphagia, unspecified: Secondary | ICD-10-CM | POA: Diagnosis present

## 2013-01-25 DIAGNOSIS — E785 Hyperlipidemia, unspecified: Secondary | ICD-10-CM | POA: Diagnosis present

## 2013-01-25 DIAGNOSIS — K625 Hemorrhage of anus and rectum: Secondary | ICD-10-CM | POA: Diagnosis not present

## 2013-01-25 DIAGNOSIS — I252 Old myocardial infarction: Secondary | ICD-10-CM | POA: Diagnosis not present

## 2013-01-25 DIAGNOSIS — K219 Gastro-esophageal reflux disease without esophagitis: Secondary | ICD-10-CM | POA: Diagnosis present

## 2013-01-25 DIAGNOSIS — Z800 Family history of malignant neoplasm of digestive organs: Secondary | ICD-10-CM

## 2013-01-25 DIAGNOSIS — Z6830 Body mass index (BMI) 30.0-30.9, adult: Secondary | ICD-10-CM | POA: Diagnosis not present

## 2013-01-25 DIAGNOSIS — F411 Generalized anxiety disorder: Secondary | ICD-10-CM | POA: Diagnosis present

## 2013-01-25 DIAGNOSIS — D62 Acute posthemorrhagic anemia: Secondary | ICD-10-CM | POA: Diagnosis present

## 2013-01-25 DIAGNOSIS — K922 Gastrointestinal hemorrhage, unspecified: Secondary | ICD-10-CM | POA: Diagnosis not present

## 2013-01-25 HISTORY — DX: Basal cell carcinoma of skin, unspecified: C44.91

## 2013-01-25 HISTORY — DX: Squamous cell carcinoma of skin of unspecified ear and external auricular canal: C44.221

## 2013-01-25 LAB — CBC WITH DIFFERENTIAL/PLATELET
Basophils Absolute: 0 10*3/uL (ref 0.0–0.1)
Basophils Relative: 0 % (ref 0–1)
HCT: 35.5 % — ABNORMAL LOW (ref 39.0–52.0)
Lymphocytes Relative: 23 % (ref 12–46)
MCHC: 35.2 g/dL (ref 30.0–36.0)
Neutro Abs: 4.2 10*3/uL (ref 1.7–7.7)
Neutrophils Relative %: 62 % (ref 43–77)
Platelets: 204 10*3/uL (ref 150–400)
RDW: 13.5 % (ref 11.5–15.5)
WBC: 6.8 10*3/uL (ref 4.0–10.5)

## 2013-01-25 LAB — PROTIME-INR: Prothrombin Time: 13.7 seconds (ref 11.6–15.2)

## 2013-01-25 LAB — CBC
MCH: 29.1 pg (ref 26.0–34.0)
MCHC: 34.8 g/dL (ref 30.0–36.0)
MCV: 83.8 fL (ref 78.0–100.0)
Platelets: 154 10*3/uL (ref 150–400)
RDW: 13.6 % (ref 11.5–15.5)

## 2013-01-25 LAB — TYPE AND SCREEN
ABO/RH(D): A POS
Antibody Screen: NEGATIVE

## 2013-01-25 LAB — BASIC METABOLIC PANEL
CO2: 20 mEq/L (ref 19–32)
Chloride: 102 mEq/L (ref 96–112)
GFR calc Af Amer: 86 mL/min — ABNORMAL LOW (ref >90)
Potassium: 3.9 mEq/L (ref 3.5–5.1)
Sodium: 134 mEq/L — ABNORMAL LOW (ref 135–145)

## 2013-01-25 LAB — ABO/RH: ABO/RH(D): A POS

## 2013-01-25 MED ORDER — PANTOPRAZOLE SODIUM 40 MG PO TBEC
40.0000 mg | DELAYED_RELEASE_TABLET | Freq: Every day | ORAL | Status: DC
  Administered 2013-01-25 – 2013-01-27 (×3): 40 mg via ORAL
  Filled 2013-01-25 (×3): qty 1

## 2013-01-25 MED ORDER — ALPRAZOLAM 0.25 MG PO TABS: 0.2500 mg | ORAL_TABLET | Freq: Every evening | ORAL | Status: DC | PRN

## 2013-01-25 MED ORDER — PEG-KCL-NACL-NASULF-NA ASC-C 100 G PO SOLR
0.5000 | Freq: Once | ORAL | Status: AC
  Administered 2013-01-26: 50 g via ORAL

## 2013-01-25 MED ORDER — SODIUM CHLORIDE 0.9 % IJ SOLN
3.0000 mL | Freq: Two times a day (BID) | INTRAMUSCULAR | Status: DC
  Administered 2013-01-25 – 2013-01-26 (×4): 3 mL via INTRAVENOUS

## 2013-01-25 MED ORDER — SODIUM CHLORIDE 0.9 % IV BOLUS (SEPSIS)
1000.0000 mL | Freq: Once | INTRAVENOUS | Status: AC
  Administered 2013-01-25: 1000 mL via INTRAVENOUS

## 2013-01-25 MED ORDER — PEG-KCL-NACL-NASULF-NA ASC-C 100 G PO SOLR: 1.0000 | Freq: Once | ORAL | Status: DC

## 2013-01-25 MED ORDER — SODIUM CHLORIDE 0.9 % IV SOLN
INTRAVENOUS | Status: DC
  Administered 2013-01-25: 13:00:00 via INTRAVENOUS
  Administered 2013-01-26: 20 mL/h via INTRAVENOUS
  Administered 2013-01-26: 02:00:00 via INTRAVENOUS
  Administered 2013-01-26: 500 mL via INTRAVENOUS

## 2013-01-25 MED ORDER — PEG-KCL-NACL-NASULF-NA ASC-C 100 G PO SOLR
0.5000 | Freq: Once | ORAL | Status: AC
  Administered 2013-01-25: 50 g via ORAL
  Filled 2013-01-25: qty 1

## 2013-01-25 NOTE — ED Provider Notes (Signed)
History     CSN: [id number]  Arrival date & time 01/25/13  0940   First MD Initiated Contact with Patient 01/25/13 [phone]      Chief Complaint  Patient presents with  . Rectal Bleeding    (Consider location/radiation/quality/duration/timing/severity/associated sxs/prior treatment) HPI Comments: Patient is a 72 year old male with a past medical history of hematochezia, Barrett's esophagus, and hypertension who presents with a 2 day history of rectal bleeding. Symptoms started suddenly and remained constant since the onset. Patient reports this will happen occasionally and usually resolves the next day but this time it did not resolve. He had 8 bowel movements yesterday that were mostly frank red blood and this has continued today. He is a patient of [facility]. He reports associates nausea and dizziness. No aggravating/alleviating factors.    Past Medical History  Diagnosis Date  . ANXIETY 08/22/2008  . Barrett's esophagus 02/09/2007  . CORONARY ARTERY DISEASE 02/09/2007  . DIVERTICULOSIS, COLON 02/09/2007  . ERECTILE DYSFUNCTION 08/22/2008  . HYPERLIPIDEMIA 02/09/2007  . MYOCARDIAL INFARCTION, HX OF 02/09/2007  . NEPHROLITHIASIS, HX OF 02/09/2007  . OBESITY 03/20/2010    Past Surgical History  Procedure Laterality Date  . Lithotripsy    . Tonsilectomy, adenoidectomy, bilateral myringotomy and tubes      Family History  Problem Relation Age of Onset  . Alcohol abuse Father   . Liver disease Father   . Heart disease Brother     CABG    History  Substance Use Topics  . Smoking status: Former Games developer  . Smokeless tobacco: Not on file  . Alcohol Use: No      Review of Systems  Gastrointestinal: Positive for blood in stool.  All other systems reviewed and are negative.    Allergies  Atorvastatin  Home Medications   Current Outpatient Rx  Name  Route  Sig  Dispense  Refill  . ALPRAZolam (XANAX) 0.25 MG tablet   Oral   Take 0.25 mg by mouth at bedtime as needed for  sleep.         Marland Kitchen aspirin 325 MG EC tablet   Oral   Take 325 mg by mouth daily.         Marland Kitchen omeprazole (PRILOSEC) 20 MG capsule   Oral   Take 20 mg by mouth daily.         . rosuvastatin (CRESTOR) 5 MG tablet   Oral   Take 0.5 tablets (2.5 mg total) by mouth daily.   30 tablet   6   . tadalafil (CIALIS) 20 MG tablet   Oral   Take 1 tablet (20 mg total) by mouth daily as needed.   10 tablet   5     There were no vitals taken for this visit.  Physical Exam  Nursing note and vitals reviewed. Constitutional: He is oriented to person, place, and time. He appears well-developed and well-nourished. No distress.  HENT:  Head: Normocephalic and atraumatic.  Eyes: EOM are normal.  Pale conjunctiva.   Neck: Normal range of motion.  Cardiovascular: Normal rate and regular rhythm.  Exam reveals no gallop and no friction rub.   No murmur heard. Pulmonary/Chest: Effort normal and breath sounds normal. He has no wheezes. He has no rales. He exhibits no tenderness.  Abdominal: Soft. He exhibits no distension. There is no tenderness. There is no rebound and no guarding.  Genitourinary:  Homero Fellers blood per rectal exam.   Musculoskeletal: Normal range of motion.  Neurological: He is  alert and oriented to person, place, and time. Coordination normal.  Speech is goal-oriented. Moves limbs without ataxia.   Skin: Skin is warm and dry. There is pallor.  Psychiatric: He has a normal mood and affect. His behavior is normal.    ED Course  Procedures (including critical care time)  Labs Reviewed  CBC WITH DIFFERENTIAL - Abnormal; Notable for the following:    Hemoglobin 12.5 (*)    HCT 35.5 (*)    Monocytes Relative 14 (*)    All other components within normal limits  BASIC METABOLIC PANEL - Abnormal; Notable for the following:    Sodium 134 (*)    Glucose, Bld 113 (*)    GFR calc non Af Amer 74 (*)    GFR calc Af Amer 86 (*)    All other components within normal limits  PROTIME-INR   TYPE AND SCREEN  ABO/RH   No results found.   1. GI bleeding       MDM  10:19 AM Labs pending. Frank blood per rectum.   11:54 AM Hemoglobin decreased from previous. Patient having a GI bleed. PA from [facility] saw the patient and will admit him. Patient currently receiving fluids.       Emilia Beck, PA-C 01/25/13 1523

## 2013-01-25 NOTE — Telephone Encounter (Signed)
Patient reports he is having multiple BM passing a large amount of dark blood per rectum more blood than stool.  He is having cramping and is dizzy.  He is advised to go to the ER now for evaluation.  He is instructed not to drive himself.  He states he wants to go to [facility].  [name], PA notified that patient will be coming to ER.

## 2013-01-25 NOTE — ED Notes (Signed)
Family at bedside. 

## 2013-01-25 NOTE — Consult Note (Deleted)
[name] Consult: 11:49 AM 01/25/2013   Referring Provider: ED MD   Primary Care Physician:  Judie Petit, MD Primary Gastroenterologist:  Dr. Russella Dar  Cardiologist:  Dr Chales Abrahams of [facility] in [facility].  [phone] [id number]  Reason for Consultation:  Painless hematochezia.   HPI: Jose [name] is a 72 y.o. male.  Hx Barrett's esophagus without dysplasia, esophageal stricture dilated in 2003, HH.  Hx of hematochezia with  diverticulosis and mixed hemorrhoids on 2003 colonoscopy, unable to locate record of 2008 colonoscopy.  Hx nephrolithiasis.  Had cardiac stent placed to LAD in 2001; looking at pt's stent wallet card I can not tell if it was BMS or coated stent.   Since 2 PM yesterday has had total of 8 episodes of moderate to large volume hematochezia without abdominal pain.  No nausea, no emesis.  Eating well.  Takes daily Omeprazole.  Is having some solid dysphagia and was going to try to get an EGD done, but has not called GI office until today.   Last bloody stool at 6 AM when had some solid stool mixed with lesser volume of blood.    Felt a little bit orthosatic this AM, but it was brief.  Last PM a home BP reading was 107/53, pulse was 52.  He says that MD stopped Atenolol in 2013 due to asymptomatic bradycardia.  Says that previous episodes of hematochezia occurring twice since 2008 years have generally been moderate to large in volume and painless but always stopped within 24 hours, so he never went to hospital or was admitted.   Takes 325 ASA daily.  Does not recall if he ever took Plavix in past. 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  No NSAIDs    Was using mattock/pick axe to dig ditch earlier yesterday a few hours before the onset of bleeding.  Says he had brief pressure in chest, it resolved with cessation of the activity and did not return when he proceeded to  shovel out the loosened dirt.  His last nuclear stress test was 4 to 5 years ago and was not worrisome per pt report.    Past Medical History  Diagnosis Date  . ANXIETY 08/22/2008  . Barrett's esophagus 2003  . CORONARY ARTERY DISEASE 2001    LAD stent per Dr Chales Abrahams in [place]  . DIVERTICULOSIS, COLON 2003  . ERECTILE DYSFUNCTION 08/22/2008  . HYPERLIPIDEMIA 02/09/2007  . MYOCARDIAL INFARCTION, HX OF 2001  . NEPHROLITHIASIS, HX OF 02/09/2007    s/p lithotripsy  . OBESITY 03/20/2010  . Hematochezia     episodic since at least 2003  . Squamous cell cancer of skin of earlobe     removed from area behind right earlobe  . Basal cell cancer     removed from right forearm.     Past Surgical History  Procedure Laterality Date  . Lithotripsy    . Tonsilectomy, adenoidectomy, bilateral myringotomy and tubes    . Skin cancer excision      right forearm and posterior right ear.     Prior to Admission medications   Medication Sig Start Date End Date Taking? Authorizing Provider  ALPRAZolam (XANAX) 0.25 MG tablet TAKE 1 TABLET BY MOUTH AT BEDTIME AS NEEDED 10/14/12   Lindley Magnus, MD  aspirin 81 MG tablet Take 81 mg by mouth daily.      Historical Provider, MD  omeprazole (PRILOSEC) 20 MG capsule  TAKE 1 CAPSULE DAILY (20 MG TOTAL) 09/04/12   Lindley Magnus, MD  rosuvastatin (CRESTOR) 5 MG tablet Take 0.5 tablets (2.5 mg total) by mouth daily. 08/23/11   Lindley Magnus, MD  tadalafil (CIALIS) 20 MG tablet Take 1 tablet (20 mg total) by mouth daily as needed. 04/21/12   Lindley Magnus, MD    Scheduled Meds:  Infusions:  PRN Meds:    Allergies as of 01/25/2013 - Review Complete 01/25/2013  Allergen Reaction Noted  . Atorvastatin  11/07/2009    Family History  Problem Relation Age of Onset  . Alcohol abuse/ETOH liver disease Father   . Liver disease Father   . Heart disease Brother     CABG   Blood clot                                         Mother  Esophageal cancer                           Daughter who died with this within 10 months.  Died late December 11, 2022, early Jan 09, 2013.    No fm hx of IBD, colon polyps or colon cancer  History   Social History  . Marital Status: Married    Spouse Name: N/A    Number of Children: N/A  . Years of Education: N/A   Occupational History  . 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    Social History Main Topics  . Smoking status: Former Games developer  . Smokeless tobacco: Not on file  . Alcohol Use:   . Drug Use:   . Sexually Active:      REVIEW OF SYSTEMS: Constitutional:  Weighed 212# 03/2012, currently:  ENT:  No nose bleed Pulm:  No SOB, cough or significant dyspnea CV:  As per HPI GU:  No recent hematuria or sxs of kidney stones GI:  Per HPI MS:  Occasional joint pain or stiffness, treats with Tylenol prn. Heme:  Never taken iron before, no excessive bleeding or bruising.    Transfusions:  None ever Neuro:  No blurry vision, no numbness or tingling Derm:  No rash, itching or non-healing sores Endocrine:  No excessive thirst Immunization:  Tdap 08/2003, Pneumovax 07/2008 Travel:  Goes to [facility] regularly.    PHYSICAL EXAM: Vital signs in last 24 hours: Temp:  [97.3 F (36.3 C)] 97.3 F (36.3 C) (05/29 1025) Pulse Rate:  [71] 71 (05/29 1025) Resp:  [16] 16 (05/29 1025) BP: (116)/(73) 116/73 mmHg (05/29 1025) SpO2:  [97 %] 97 % (05/29 1025)  Filed Vitals:   01/25/13 1025  BP: 116/73  Pulse: 71  Temp: 97.3 F (36.3 C)  Resp: 16    General: somewhat pale, non-diaphoretic older WM.  NAD and comfortable.  Looks in good health overall Head:  No facial swelling or asymmetry  Eyes:  No icterus Ears:  Not HOH  Nose:  No congestion or drainage Mouth:  Good dental repair.  Moist, pink, clear oral MM Neck:  No JVD , no bruits, no mass or TMG Lungs:  Clear bil.  No cough, no SOB Heart: RRR.  No MRG Abdomen:  Soft, not tender or distended.  No bruits, masses or HSM.  No hernias.   Rectal: red  blood on exam glove.  Enlarged, smooth prostate. No palpable or visible  hemorrhoids   Musc/Skeltl: no joint deformities or swelling. Extremities:  No pedal edema.  3 + pedal pulses  Neurologic:  No tremor, no limb weakness.   Skin:  No rash or sores Tattoos:  On both upper arms. Nodes:  No inguinal adenopathy   Psych:  Pleasant, slightly anxious, fully alert and cooperative.   Intake/Output from previous day:   Intake/Output this shift:    LAB RESULTS:  Recent Labs  01/25/13 1010  WBC 6.8  HGB 12.5*  HCT 35.5*  PLT 204  MCV    84  BMET    Component Value Date/Time   NA 134* 01/25/2013 1010   K 3.9 01/25/2013 1010   CL 102 01/25/2013 1010   CO2 20 01/25/2013 1010   GLUCOSE 113* 01/25/2013 1010   GLUCOSE 96 06/10/2006 1029   BUN 20 01/25/2013 1010   CREATININE 1.01 01/25/2013 1010   CALCIUM 9.3 01/25/2013 1010   GFRNONAA 74* 01/25/2013 1010   GFRAA 86* 01/25/2013 1010   PT/INR No results found for this basename: INR,  PROTIME   RADIOLOGY STUDIES: No results found.  ENDOSCOPIC STUDIES: 10/2009  EGD for Barrett's surveillance ENDOSCOPIC IMPRESSION:  1) 33 - 37 cm barrett's esophagus  2) Small hiatal hernia  RECOMMENDATIONS:  1) Anti-reflux regimen  2) Await pathology results  3) EGD in 3 years  4) continue PPI Pathology:   1. EG/GE JUNCTION, : - INTESTINAL METAPLASIA (GOBLET CELL METAPLASIA) CONSISTENT WITH BARRETT' S ESOPHAGUS. NO DYSPLASIA OR MALIGNANCY IDENTIFIED.  12/2006 Colonoscopy for hematochezia. Unable to find report  02/2002  Colonoscopy Int and ext 'rrhoids, diverticulosis.    IMPRESSION: *  Painless hematochezia.  Suspect diverticular bleed *  Hx Barrett's esophagus, last EGD was 2011.  Due follow up EGD this year.  *  Hx esophageal stricture dilated in 2003, lately having some solid dysphagia.  *  2001 MI and LAD stent placement, not clear if BMS or DES.  Fleeting chest pain/pressure while using pick axe yesterday.    PLAN: *  CBC q 8 hours ,  PT/INR, type and screen, BMET.   Clear liquids, admit tele vs stepdown.  Consider nuclear scan if bleeding repeats itself.  *  Hold ASA. *  Colonoscopy 11 AM tomorrow, split dose prep starting tonite.       LOS: 0 days   [name]  01/25/2013, 11:49 AM Pager: [phone]

## 2013-01-25 NOTE — ED Provider Notes (Signed)
72 y/o With a history of lower GI bleeding, has brisk bleeding time 7 bowel movements. Abdomen is soft on my exam, he does appear slightly pale but at this time his blood pressure has improved with IV fluids. The patient has a history of diverticular bleeding found on colonoscopy which resolved spontaneously in the past. His hemoglobin is approximately 2-1/2 g lower than it has been a prior visits last year, he will require admission to the hospital, gastroenterology consultation.   Medical screening examination/treatment/procedure(s) were conducted as a shared visit with non-physician practitioner(s) and myself.  I personally evaluated the patient during the encounter    Vida Roller, MD 01/25/13 1109

## 2013-01-25 NOTE — ED Notes (Signed)
Report called to sandra

## 2013-01-25 NOTE — ED Notes (Signed)
Patient said he has had rectal bleeding since yesterday.  He advises it is frank, red blood.  The patient denies abdominal pain, however he says he is nauseated and dizzy.  He advises this has happened to him before and it usually stops the next day.  Dr. Russella Dar, with [facility] is managing his GI problems.  They have performed a colonoscopy and endoscopy with negative results.  He called them this morning because he has had eight bowel movements with blood in them and little stool.  Wife is in the room with patient.

## 2013-01-25 NOTE — ED Notes (Signed)
The patient was sitting in a chair while I was inserting an IV.  He saw some of the blood and began to say he felt nauseated and dizzy.  He wanted tho stand up and he started getting pale and passed out for about 5seconds.  We put a wet wash cloth on hid forehead and he said he started feeling better.  Liza, EMT and I put the patient back in the bed and he felt better.

## 2013-01-25 NOTE — ED Notes (Signed)
Patient requested a drink, and I advised him he needed to drink clear fluids.  I gave him a sprite to drink.  Patient is comfortable and resting now.

## 2013-01-25 NOTE — H&P (Signed)
[facility] admission H & P 11:49 AM 01/25/2013   Referring Provider: ED MD   Primary Care Physician:  Judie Petit, MD Primary Gastroenterologist:  Dr. Russella Dar  Cardiologist:  Dr Chales Abrahams of [facility] in [facility].  [phone] [id number]  Reason for Consultation:  Painless hematochezia.   HPI: Jose [name] is a 72 y.o. male.  Hx Barrett's esophagus without dysplasia, esophageal stricture dilated in 2003, HH.  Hx of hematochezia with  diverticulosis and mixed hemorrhoids on 2003 colonoscopy, unable to locate record of 2008 colonoscopy.  Hx nephrolithiasis.  Had cardiac stent placed to LAD in 2001; looking at pt's stent wallet card I can not tell if it was BMS or coated stent.   Since 2 PM yesterday has had total of 8 episodes of moderate to large volume hematochezia without abdominal pain.  No nausea, no emesis.  Eating well.  Takes daily Omeprazole.  Is having some solid dysphagia and was going to try to get an EGD done, but has not called GI office until today.   Last bloody stool at 6 AM when had some solid stool mixed with lesser volume of blood.    Felt a little bit orthosatic this AM, but it was brief.  Last PM a home BP reading was 107/53, pulse was 52.  He says that MD stopped Atenolol in 2013 due to asymptomatic bradycardia.  Says that previous episodes of hematochezia occurring twice since 2008 years have generally been moderate to large in volume and painless but always stopped within 24 hours, so he never went to hospital or was admitted.   Takes 325 ASA daily.  Does not recall if he ever took Plavix in past. 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  No NSAIDs    Was using mattock/pick axe to dig ditch earlier yesterday a few hours before the onset of bleeding.  Says he had brief pressure in chest, it resolved with cessation of the activity and did not return when he proceeded  to shovel out the loosened dirt.  His last nuclear stress test was 4 to 5 years ago and was not worrisome per pt report.    Past Medical History  Diagnosis Date  . ANXIETY 08/22/2008  . Barrett's esophagus 2003  . CORONARY ARTERY DISEASE 2001    LAD stent per Dr Chales Abrahams in [place]  . DIVERTICULOSIS, COLON 2003  . ERECTILE DYSFUNCTION 08/22/2008  . HYPERLIPIDEMIA 02/09/2007  . MYOCARDIAL INFARCTION, HX OF 2001  . NEPHROLITHIASIS, HX OF 02/09/2007    s/p lithotripsy  . OBESITY 03/20/2010  . Hematochezia     episodic since at least 2003  . Squamous cell cancer of skin of earlobe     removed from area behind right earlobe  . Basal cell cancer     removed from right forearm.     Past Surgical History  Procedure Laterality Date  . Lithotripsy    . Tonsilectomy, adenoidectomy, bilateral myringotomy and tubes    . Skin cancer excision      right forearm and posterior right ear.     Prior to Admission medications   Medication Sig Start Date End Date Taking? Authorizing Provider  ALPRAZolam (XANAX) 0.25 MG tablet TAKE 1 TABLET BY MOUTH AT BEDTIME AS NEEDED 10/14/12   Lindley Magnus, MD  aspirin 81 MG tablet Take 81 mg by mouth daily.      Historical Provider, MD  omeprazole (PRILOSEC)  20 MG capsule TAKE 1 CAPSULE DAILY (20 MG TOTAL) 09/04/12   Lindley Magnus, MD  rosuvastatin (CRESTOR) 5 MG tablet Take 0.5 tablets (2.5 mg total) by mouth daily. 08/23/11   Lindley Magnus, MD  tadalafil (CIALIS) 20 MG tablet Take 1 tablet (20 mg total) by mouth daily as needed. 04/21/12   Lindley Magnus, MD    Scheduled Meds:  Infusions:  PRN Meds:    Allergies as of 01/25/2013 - Review Complete 01/25/2013  Allergen Reaction Noted  . Atorvastatin  11/07/2009    Family History  Problem Relation Age of Onset  . Alcohol abuse/ETOH liver disease Father   . Liver disease Father   . Heart disease Brother     CABG   Blood clot                                         Mother  Esophageal cancer                           Daughter who died with this within 10 months.  Died late 2022-12-18, early Jan 16, 2013.    No fm hx of IBD, colon polyps or colon cancer  History   Social History  . Marital Status: Married    Spouse Name: N/A    Number of Children: N/A  . Years of Education: N/A   Occupational History  . 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    Social History Main Topics  . Smoking status: Former Games developer  . Smokeless tobacco: Not on file  . Alcohol Use:   . Drug Use:   . Sexually Active:      REVIEW OF SYSTEMS: Constitutional:  Weighed 212# 03/2012, currently:  ENT:  No nose bleed Pulm:  No SOB, cough or significant dyspnea CV:  As per HPI GU:  No recent hematuria or sxs of kidney stones GI:  Per HPI MS:  Occasional joint pain or stiffness, treats with Tylenol prn. Heme:  Never taken iron before, no excessive bleeding or bruising.    Transfusions:  None ever Neuro:  No blurry vision, no numbness or tingling Derm:  No rash, itching or non-healing sores Endocrine:  No excessive thirst Immunization:  Tdap 08/2003, Pneumovax 07/2008 Travel:  Goes to [facility] regularly.    PHYSICAL EXAM: Vital signs in last 24 hours: Temp:  [97.3 F (36.3 C)] 97.3 F (36.3 C) (05/29 1025) Pulse Rate:  [71] 71 (05/29 1025) Resp:  [16] 16 (05/29 1025) BP: (116)/(73) 116/73 mmHg (05/29 1025) SpO2:  [97 %] 97 % (05/29 1025)  Filed Vitals:   01/25/13 1025  BP: 116/73  Pulse: 71  Temp: 97.3 F (36.3 C)  Resp: 16    General: somewhat pale, non-diaphoretic older WM.  NAD and comfortable.  Looks in good health overall Head:  No facial swelling or asymmetry  Eyes:  No icterus Ears:  Not HOH  Nose:  No congestion or drainage Mouth:  Good dental repair.  Moist, pink, clear oral MM Neck:  No JVD , no bruits, no mass or TMG Lungs:  Clear bil.  No cough, no SOB Heart: RRR.  No MRG Abdomen:  Soft, not tender or distended.  No bruits, masses or HSM.  No hernias.   Rectal: red  blood on exam glove.  Enlarged, smooth prostate. No  palpable or visible hemorrhoids   Musc/Skeltl: no joint deformities or swelling. Extremities:  No pedal edema.  3 + pedal pulses  Neurologic:  No tremor, no limb weakness.   Skin:  No rash or sores Tattoos:  On both upper arms. Nodes:  No inguinal adenopathy   Psych:  Pleasant, slightly anxious, fully alert and cooperative.   Intake/Output from previous day:   Intake/Output this shift:    LAB RESULTS:  Recent Labs  01/25/13 1010  WBC 6.8  HGB 12.5*  HCT 35.5*  PLT 204  MCV    84  BMET    Component Value Date/Time   NA 134* 01/25/2013 1010   K 3.9 01/25/2013 1010   CL 102 01/25/2013 1010   CO2 20 01/25/2013 1010   GLUCOSE 113* 01/25/2013 1010   GLUCOSE 96 06/10/2006 1029   BUN 20 01/25/2013 1010   CREATININE 1.01 01/25/2013 1010   CALCIUM 9.3 01/25/2013 1010   GFRNONAA 74* 01/25/2013 1010   GFRAA 86* 01/25/2013 1010   PT/INR No results found for this basename: INR,  PROTIME   RADIOLOGY STUDIES: No results found.  ENDOSCOPIC STUDIES: 10/2009  EGD for Barrett's surveillance ENDOSCOPIC IMPRESSION:  1) 33 - 37 cm barrett's esophagus  2) Small hiatal hernia  RECOMMENDATIONS:  1) Anti-reflux regimen  2) Await pathology results  3) EGD in 3 years  4) continue PPI Pathology:   1. EG/GE JUNCTION, : - INTESTINAL METAPLASIA (GOBLET CELL METAPLASIA) CONSISTENT WITH BARRETT' S ESOPHAGUS. NO DYSPLASIA OR MALIGNANCY IDENTIFIED.  12/2006 Colonoscopy for hematochezia. Unable to find report  02/2002  Colonoscopy Int and ext 'rrhoids, diverticulosis.    IMPRESSION: *  Painless hematochezia.  Suspect diverticular bleed *  Hx Barrett's esophagus, last EGD was 2011.  Due follow up EGD this year.  *  Hx esophageal stricture dilated in 2003, lately having some solid dysphagia.  *  2001 MI and LAD stent placement, not clear if BMS or DES.  Fleeting chest pain/pressure while using pick axe yesterday.    PLAN: *  CBC q 8 hours ,  PT/INR, type and screen, BMET.   Clear liquids, admit tele vs stepdown.  Consider nuclear scan if bleeding repeats itself.  *  Hold ASA. *  Colonoscopy 11 AM tomorrow, split dose prep starting tonite.       LOS: 0 days   [name]  01/25/2013, 11:49 AM Pager: [phone]     ________________________________________________________________________  [facility] MD note:  I personally examined the patient, reviewed the data and agree with the assessment and plan described above.  He has had 2 self limited similar bleeds and hopefully this bleeding will stop without intervention.  Planning for colonoscopy tomorrow, will admit with serial H/Hs, IVFs, blood products if needed.   Rob Bunting, MD [facility] Pager [phone]

## 2013-01-26 ENCOUNTER — Encounter (HOSPITAL_COMMUNITY)
Admission: EM | Disposition: A | Discharge status: Home / Self Care | Payer: Self-pay | Source: Home / Self Care | Attending: Gastroenterology

## 2013-01-26 ENCOUNTER — Encounter (HOSPITAL_COMMUNITY): Payer: Self-pay | Admitting: Gastroenterology

## 2013-01-26 DIAGNOSIS — K5730 Diverticulosis of large intestine without perforation or abscess without bleeding: Secondary | ICD-10-CM | POA: Diagnosis not present

## 2013-01-26 DIAGNOSIS — K922 Gastrointestinal hemorrhage, unspecified: Secondary | ICD-10-CM | POA: Diagnosis not present

## 2013-01-26 HISTORY — PX: COLONOSCOPY: SHX5424

## 2013-01-26 LAB — CBC
HCT: 31.8 % — ABNORMAL LOW (ref 39.0–52.0)
Hemoglobin: 9.5 g/dL — ABNORMAL LOW (ref 13.0–17.0)
MCH: 29.3 pg (ref 26.0–34.0)
MCHC: 34.3 g/dL (ref 30.0–36.0)
Platelets: 158 10*3/uL (ref 150–400)
Platelets: 127 10*3/uL — ABNORMAL LOW (ref 150–400)
RBC: 3.24 MIL/uL — ABNORMAL LOW (ref 4.22–5.81)
RDW: 13.6 % (ref 11.5–15.5)
WBC: 4.7 10*3/uL (ref 4.0–10.5)
WBC: 3.5 10*3/uL — ABNORMAL LOW (ref 4.0–10.5)

## 2013-01-26 SURGERY — COLONOSCOPY
Anesthesia: Moderate Sedation

## 2013-01-26 MED ORDER — FENTANYL CITRATE 0.05 MG/ML IJ SOLN
INTRAMUSCULAR | Status: DC | PRN
  Administered 2013-01-26 (×3): 25 ug via INTRAVENOUS

## 2013-01-26 MED ORDER — FENTANYL CITRATE 0.05 MG/ML IJ SOLN
INTRAMUSCULAR | Status: AC
  Filled 2013-01-26: qty 4

## 2013-01-26 MED ORDER — MIDAZOLAM HCL 5 MG/5ML IJ SOLN
INTRAMUSCULAR | Status: DC | PRN
  Administered 2013-01-26 (×3): 2 mg via INTRAVENOUS

## 2013-01-26 MED ORDER — MIDAZOLAM HCL 5 MG/ML IJ SOLN
INTRAMUSCULAR | Status: AC
  Filled 2013-01-26: qty 2

## 2013-01-26 NOTE — Progress Notes (Signed)
Pt completed bowel prep and now NPO. Stools are dark brown and red, watery. Endorsed to [employer]. Leonarda Salon MD from [facility] paged. Awaiting for new orders.

## 2013-01-26 NOTE — Progress Notes (Signed)
Patient's heart rate  Frequently falls to 38/min especially when patient is sleeping,count apical pulse when patient checked 47/min,bp was 123/82.not in distress,not complaining of anything,skin warm and dry,patient's wife at bedside,doesn't noticed anything different especially when patient was sleeping,md made aware.

## 2013-01-26 NOTE — ED Provider Notes (Signed)
Medical screening examination/treatment/procedure(s) were conducted as a shared visit with non-physician practitioner(s) and myself.  I personally evaluated the patient during the encounter  Please see my separate respective documentation pertaining to this patient encounter   Vida Roller, MD 01/26/13 [phone]

## 2013-01-26 NOTE — Progress Notes (Signed)
     Jose [name] Daily Rounding Note 01/26/2013, 8:44 AM  SUBJECTIVE:       Stool on last 2 occassions this AM are clear.  Pink tinged earlier this AM, yellow/clear at present.  Not dizzy.  Hungry.  Really wants to go home, if not going home negotiating to get solids  OBJECTIVE:         Vital signs in last 24 hours:    Temp:  [97.3 F (36.3 C)-98.4 F (36.9 C)] 97.9 F (36.6 C) (05/30 0600) Pulse Rate:  [60-79] 60 (05/30 0600) Resp:  [16-20] 18 (05/30 0600) BP: (107-128)/(62-73) 107/62 mmHg (05/30 0600) SpO2:  [96 %-100 %] 97 % (05/30 0600) Weight:  [93.441 kg (206 lb)-94.983 kg (209 lb 6.4 oz)] 94.983 kg (209 lb 6.4 oz) (05/29 2205) Last BM Date: 01/25/13 General: looks well   Heart: RRR Chest: clear bil Abdomen: soft, NT, ND  Extremities: no CCE Neuro/Psych:  Pleasant, no tremor, standing and moving easily.   Intake/Output from previous day: 05/29 0701 - 05/30 0700 In: 1248.8 [I.V.:1248.8] Out: -   Intake/Output this shift:    Lab Results:  Recent Labs  01/25/13 1010 01/25/13 1937 01/26/13 0555  WBC 6.8 6.4 4.7  HGB 12.5* 10.4* 10.9*  HCT 35.5* 29.9* 31.8*  PLT 204 154 158   BMET  Recent Labs  01/25/13 1010  NA 134*  K 3.9  CL 102  CO2 20  GLUCOSE 113*  BUN 20  CREATININE 1.01  CALCIUM 9.3   PT/INR  13.7/1.0   ASSESMENT: * Painless hematochezia. Suspect diverticular bleed.  Colonoscopy today.  *  ABL anemia.  Hgb drop of 1.6 grams.  No need of transfusion at present, type and screen in place.   On q 8 CBC  * Hx Barrett's esophagus, last EGD was 2011. Due follow up EGD this year. Plan to do this outpt.  * Hx esophageal stricture dilated in 2003, lately having some solid dysphagia. Plan EGD with dilt as outpt.  * 2001 MI and LAD stent placement, not clear if BMS or DES. No antiplatelet meds except full dose ASA, currently on hold.    PLAN: *  BID CBC *  Colonoscopy this AM. *  Print material regarding diverticulosis for pt.  Hopefully will help  answer some of his many questions   LOS: 1 day   Jose [name]  01/26/2013, 8:44 AM Pager: [phone]

## 2013-01-26 NOTE — Interval H&P Note (Signed)
History and Physical Interval Note:  01/26/2013 11:53 AM  Jose [name]  has presented today for surgery, with the diagnosis of lower GI Bleed  The various methods of treatment have been discussed with the patient and family. After consideration of risks, benefits and other options for treatment, the patient has consented to  Procedure(s): COLONOSCOPY (N/A) as a surgical intervention .  The patient's history has been reviewed, patient examined, no change in status, stable for surgery.  I have reviewed the patient's chart and labs.  Questions were answered to the patient's satisfaction.     Rob Bunting

## 2013-01-26 NOTE — Op Note (Signed)
[facility] [address] [place], [address]   COLONOSCOPY PROCEDURE REPORT  PATIENT: Jose [name], Jose [name]  MR#: [id number] BIRTHDATE: [birth year] , 71  yrs. old GENDER: Male ENDOSCOPIST: Rachael Fee, MD PROCEDURE DATE:  01/26/2013 PROCEDURE:   Colonoscopy, diagnostic ASA CLASS:   Class III INDICATIONS:overt rectal bleeding. MEDICATIONS: Fentanyl 75 mcg IV and Versed 6 mg IV  DESCRIPTION OF PROCEDURE:   After the risks benefits and alternatives of the procedure were thoroughly explained, informed consent was obtained.  A digital rectal exam revealed no abnormalities of the rectum.   The Pentax Adult Colon [id number] endoscope was introduced through the anus and advanced to the cecum, which was identified by both the appendix and ileocecal valve. No adverse events experienced.   The quality of the prep was good.  The instrument was then slowly withdrawn as the colon was fully examined.  COLON FINDINGS: There was recent red blood in colon.  There were multiple diverticulum throughout the colon, most densly populated in left colon.  The blood was also most prominant in left colon as well.  There was no blood in cecum or ascending segment.  Extensive flushing, suctioning resulted in very good examination of entire colon and there were NO actively bleeding diverticulum.  The examination was otherwise normal.  Retroflexed views revealed no abnormalities. The time to cecum=5 minutes 00 seconds.  Withdrawal time=20 minutes 00 seconds.  The scope was withdrawn and the procedure completed. COMPLICATIONS: There were no complications.  ENDOSCOPIC IMPRESSION: There was recent red blood in colon.  There were multiple diverticulum throughout the colon, most densly populated in left colon.  The blood was also most prominant in left colon as well. There was no blood in cecum or ascending segment.  Extensive flushing, suctioning resulted in very good examination of  entire colon and there were NO actively bleeding diverticulum.  The examination was otherwise normal.  RECOMMENDATIONS: Will observe at least another 24 hours in hospital, keeping him on clear liquids for the rest of the day today.  If he has recurrent, active bleeding then will plan on stat Nuc med bleeding scan.   eSigned:  Rachael Fee, MD 01/26/2013 12:53 PM   cc: Claudette Head, MD

## 2013-01-27 DIAGNOSIS — K922 Gastrointestinal hemorrhage, unspecified: Secondary | ICD-10-CM | POA: Diagnosis not present

## 2013-01-27 DIAGNOSIS — K5730 Diverticulosis of large intestine without perforation or abscess without bleeding: Secondary | ICD-10-CM | POA: Diagnosis not present

## 2013-01-27 LAB — CBC
HCT: 28.8 % — ABNORMAL LOW (ref 39.0–52.0)
RBC: 3.45 MIL/uL — ABNORMAL LOW (ref 4.22–5.81)
RDW: 13.3 % (ref 11.5–15.5)
WBC: 4.2 10*3/uL (ref 4.0–10.5)

## 2013-01-27 MED ORDER — ASPIRIN 81 MG PO TBEC: 81.0000 mg | DELAYED_RELEASE_TABLET | Freq: Every day | ORAL | Status: DC

## 2013-01-27 NOTE — Progress Notes (Signed)
Patient ID: Jose [name], male   DOB: 06/21/1941, 72 y.o.   MRN: [id number] [facility] Progress Note  Subjective: No Bm's or bleeding since procedure yesterday. Wants to go home but would like to have a Bm first...lots of questions... Hgb 9.5 last  Pm-up to 10 this am Had been on ASA 325 at home  Objective:  Vital signs in last 24 hours: Temp:  [97.4 F (36.3 C)-98.6 F (37 C)] 97.5 F (36.4 C) (05/31 0619) Pulse Rate:  [45-60] 55 (05/31 0619) Resp:  [10-33] 18 (05/31 0619) BP: (91-133)/(43-94) 105/57 mmHg (05/31 0619) SpO2:  [93 %-98 %] 94 % (05/31 0619) Weight:  [209 lb 4.8 oz (94.938 kg)] 209 lb 4.8 oz (94.938 kg) (05/30 2132) Last BM Date: 01/26/13 General:   Alert,  Well-developed, older WM   in NAD Heart:  Regular rate and rhythm; no murmurs Pulm;clear Abdomen:  Soft, nontender and nondistended. Normal bowel sounds, without guarding, and without rebound.   Extremities:  Without edema. Neurologic:  Alert and  oriented x4;  grossly normal neurologically. Psych:  Alert and cooperative. Normal mood and affect.  Intake/Output from previous day: 05/30 0701 - 05/31 0700 In: 570 [P.O.:120; I.V.:450] Out: -  Intake/Output this shift:    Lab Results:  Recent Labs  01/25/13 1937 01/26/13 0555 01/26/13 1841  WBC 6.4 4.7 3.5*  HGB 10.4* 10.9* 9.5*  HCT 29.9* 31.8* 27.2*  PLT 154 158 127*   BMET  Recent Labs  01/25/13 1010  NA 134*  K 3.9  CL 102  CO2 20  GLUCOSE 113*  BUN 20  CREATININE 1.01  CALCIUM 9.3        Assessment / Plan: #1 72 yo Wm with recurrent diverticular bleeding- no transfusion requirement and first hospitalization with bleeding. Bleed has resolved-Hgb stable. Pandiverticulosis #2 Anemia secondary to above #3 CAD s/p remote MI  Will advance diet Discharge home today Will check CBC in office next week No ASA for one week then resume at 81 mg /daily  Active Problems:   Acute lower GI bleeding     LOS: 2 days    Amy Esterwood  01/27/2013, 9:08 AM   ________________________________________________________________________  [facility] MD note:  I personally examined the patient, reviewed the data and agree with the assessment and plan described above.  Ok to d.c home today.   Rob Bunting, MD [facility] Pager [phone]

## 2013-01-27 NOTE — Discharge Summary (Signed)
[facility] Discharge Summary  Name: Jose [name] MRN: [id number] DOB: 08-03-1941 72 y.o. PCP:  Judie Petit, MD  Date of Admission: 01/25/2013  9:46 AM Date of Discharge: 01/27/2013 Attending Physician: Rachael Fee, MD  Discharge Diagnosis: #1 acute diverticular bleed-resolved #2 pandiverticulosis #3 normocytic anemia secondary to acute blood loss #4 coronary artery disease status post remote MI #5 anxiety #6 chronic GERD with history of Barrett's esophagus   Consultations: Treatment Team:  Rachael Fee, MD  Procedures Performed:  No results found.  GI Procedures: *Colonoscopy 01/26/13 dr. Christella Hartigan  History/Physical Exam:  See Admission H&P  Admission HPI: Jose [name] is a very nice 72 year old white male known to Dr. Judie Petit stark with history of previous low-grade diverticular bleeding on 2 occasions in the past which did not require hospitalization. Patient had acute onset of bright red blood per rectum on Thursday, 01/25/2013 and had multiple episodes of bleeding at home prior to presenting to the emergency room Hemoglobin on arrival was 12.5. Patient takes a 325 mg aspirin daily at home. He was hemodynamically stable on admission Hospital Course by problem list: Patient was admitted to the hospital on 01/25/2013 with acute lower GI bleed, presumed diverticular. His hemoglobin was 12.5 on arrival and gradually drifted to the 9.5 range. He did not require blood transfusion. On the morning of discharge his hemoglobin was up to 10 and he had not had any evidence of bleeding over the past 24 hours. He underwent colonoscopy yesterday 01/26/2013 with Dr. Christella Hartigan and was found to have pan diverticular disease most concentrated in the left colon. There was a little bit of oozing of blood noted in the colon at the time of the procedure, but unable to differentiate which diverticulum was oozing. Nevertheless he has not had any bowel movements or bleeding since that  time. He is allowed discharged home on 01/27/2013 instructions to limit his activity over the weekend and then no strenuous activity over the next one week. Return to the office on Wednesday, 01/31/2013 for lab. He is to hold his aspirin over the next week and then resume aspirin at reduced dose  81 mg daily He knows to return to the emergency room should he have any evidence of recurrent bleeding  Discharge Vitals:  BP 136/74  Pulse 66  Temp(Src) 97.9 F (36.6 C) (Oral)  Resp 18  Ht 5\' 9"  (1.753 m)  Wt 209 lb 4.8 oz (94.938 kg)  BMI 30.89 kg/m2  SpO2 99%  Discharge Labs:  Results for orders placed during the hospital encounter of 01/25/13 (from the past 24 hour(s))  CBC     Status: Abnormal   Collection Time    01/26/13  6:41 PM      Result Value Range   WBC 3.5 (*) 4.0 - 10.5 K/uL   RBC 3.24 (*) 4.22 - 5.81 MIL/uL   Hemoglobin 9.5 (*) 13.0 - 17.0 g/dL   HCT 19.1 (*) 47.8 - 29.5 %   MCV 84.0  78.0 - 100.0 fL   MCH 29.3  26.0 - 34.0 pg   MCHC 34.9  30.0 - 36.0 g/dL   RDW 62.1  30.8 - 65.7 %   Platelets 127 (*) 150 - 400 K/uL  CBC     Status: Abnormal   Collection Time    01/27/13  8:42 AM      Result Value Range   WBC 4.2  4.0 - 10.5 K/uL   RBC 3.45 (*) 4.22 - 5.81 MIL/uL   Hemoglobin 10.0 (*)  13.0 - 17.0 g/dL   HCT 16.1 (*) 09.6 - 04.5 %   MCV 83.5  78.0 - 100.0 fL   MCH 29.0  26.0 - 34.0 pg   MCHC 34.7  30.0 - 36.0 g/dL   RDW 40.9  81.1 - 91.4 %   Platelets 139 (*) 150 - 400 K/uL    Disposition and follow-up:   Mr.Jose [name] was discharged from [facility] in stable condition.    Follow-up Appointments:  Future Appointments Provider Department Dept Phone   02/28/2013 8:15 AM Lindley Magnus, MD [facility] [phone]      Discharge Medications:   Medication List    TAKE these medications       ALPRAZolam 0.25 MG tablet  Commonly known as:  XANAX  Take 0.25 mg by mouth at bedtime as needed for sleep.     aspirin 81 MG  EC tablet  Take 1 tablet (81 mg total) by mouth daily. Swallow whole.     omeprazole 20 MG capsule  Commonly known as:  PRILOSEC  Take 20 mg by mouth daily.     rosuvastatin 5 MG tablet  Commonly known as:  CRESTOR  Take 0.5 tablets (2.5 mg total) by mouth daily.     tadalafil 20 MG tablet  Commonly known as:  CIALIS  Take 1 tablet (20 mg total) by mouth daily as needed.        Signed: Mike Gip 01/27/2013, 9:45 AM

## 2013-01-29 ENCOUNTER — Other Ambulatory Visit: Payer: Self-pay

## 2013-01-29 DIAGNOSIS — K922 Gastrointestinal hemorrhage, unspecified: Secondary | ICD-10-CM

## 2013-01-31 ENCOUNTER — Other Ambulatory Visit (INDEPENDENT_AMBULATORY_CARE_PROVIDER_SITE_OTHER): Payer: Medicare Other

## 2013-01-31 DIAGNOSIS — K922 Gastrointestinal hemorrhage, unspecified: Secondary | ICD-10-CM

## 2013-01-31 LAB — CBC WITH DIFFERENTIAL/PLATELET
Basophils Relative: 0.5 % (ref 0.0–3.0)
HCT: 27.6 % — ABNORMAL LOW (ref 39.0–52.0)
Hemoglobin: 9.5 g/dL — ABNORMAL LOW (ref 13.0–17.0)
Lymphocytes Relative: 20.5 % (ref 12.0–46.0)
Lymphs Abs: 1.2 10*3/uL (ref 0.7–4.0)
MCHC: 34.5 g/dL (ref 30.0–36.0)
Monocytes Relative: 14.2 % — ABNORMAL HIGH (ref 3.0–12.0)
Neutro Abs: 3.7 10*3/uL (ref 1.4–7.7)
RBC: 3.18 Mil/uL — ABNORMAL LOW (ref 4.22–5.81)

## 2013-02-01 ENCOUNTER — Other Ambulatory Visit: Payer: Self-pay | Admitting: *Deleted

## 2013-02-01 DIAGNOSIS — D649 Anemia, unspecified: Secondary | ICD-10-CM

## 2013-02-19 ENCOUNTER — Telehealth: Payer: Self-pay | Admitting: *Deleted

## 2013-02-19 NOTE — Telephone Encounter (Signed)
Left a message for patient to call me. 

## 2013-02-19 NOTE — Telephone Encounter (Signed)
Message copied by Daphine Deutscher on Mon Feb 19, 2013 11:08 AM ------      Message from: Daphine Deutscher      Created: Thu Feb 01, 2013 11:19 AM       Call and remind patient due for CBC for AE on 02/19/13. ------

## 2013-02-20 NOTE — Telephone Encounter (Signed)
Left a message for patient to call me. 

## 2013-02-22 ENCOUNTER — Encounter: Payer: Self-pay | Admitting: *Deleted

## 2013-02-22 NOTE — Telephone Encounter (Signed)
Letter mailed to patient.

## 2013-02-26 ENCOUNTER — Encounter: Payer: Self-pay | Admitting: Internal Medicine

## 2013-02-26 ENCOUNTER — Ambulatory Visit (INDEPENDENT_AMBULATORY_CARE_PROVIDER_SITE_OTHER): Payer: Medicare Other | Admitting: Internal Medicine

## 2013-02-26 VITALS — BP 124/70 | HR 64 | Temp 97.9°F | Ht 68.5 in | Wt 208.0 lb

## 2013-02-26 DIAGNOSIS — E785 Hyperlipidemia, unspecified: Secondary | ICD-10-CM

## 2013-02-26 DIAGNOSIS — I2510 Atherosclerotic heart disease of native coronary artery without angina pectoris: Secondary | ICD-10-CM | POA: Diagnosis not present

## 2013-02-26 DIAGNOSIS — K2270 Barrett's esophagus without dysplasia: Secondary | ICD-10-CM

## 2013-02-26 DIAGNOSIS — F411 Generalized anxiety disorder: Secondary | ICD-10-CM | POA: Diagnosis not present

## 2013-02-26 DIAGNOSIS — R739 Hyperglycemia, unspecified: Secondary | ICD-10-CM | POA: Insufficient documentation

## 2013-02-26 DIAGNOSIS — R7309 Other abnormal glucose: Secondary | ICD-10-CM

## 2013-02-26 DIAGNOSIS — D649 Anemia, unspecified: Secondary | ICD-10-CM

## 2013-02-26 DIAGNOSIS — F528 Other sexual dysfunction not due to a substance or known physiological condition: Secondary | ICD-10-CM

## 2013-02-26 LAB — LIPID PANEL
HDL: 45 mg/dL (ref >39.00)
LDL Cholesterol: 78 mg/dL (ref 0–99)
Total CHOL/HDL Ratio: 3
Triglycerides: 94 mg/dL (ref 0.0–149.0)

## 2013-02-26 LAB — CBC WITH DIFFERENTIAL/PLATELET
Basophils Relative: 0.9 % (ref 0.0–3.0)
Eosinophils Relative: 2.4 % (ref 0.0–5.0)
HCT: 35.8 % — ABNORMAL LOW (ref 39.0–52.0)
Hemoglobin: 11.6 g/dL — ABNORMAL LOW (ref 13.0–17.0)
Lymphs Abs: 1.1 10*3/uL (ref 0.7–4.0)
MCV: 83.8 fl (ref 78.0–100.0)
Monocytes Absolute: 0.7 10*3/uL (ref 0.1–1.0)
Neutrophils Relative %: 57.7 % (ref 43.0–77.0)
RBC: 4.28 Mil/uL (ref 4.22–5.81)
WBC: 4.7 10*3/uL (ref 4.5–10.5)

## 2013-02-26 LAB — HEMOGLOBIN A1C: Hgb A1c MFr Bld: 5.6 % (ref 4.6–6.5)

## 2013-02-26 LAB — HEPATIC FUNCTION PANEL: Total Bilirubin: 0.5 mg/dL (ref 0.3–1.2)

## 2013-02-26 LAB — BASIC METABOLIC PANEL
Calcium: 9.2 mg/dL (ref 8.4–10.5)
Creatinine, Ser: 1 mg/dL (ref 0.4–1.5)
GFR: 82.42 mL/min (ref >60.00)

## 2013-02-26 MED ORDER — OMEPRAZOLE 20 MG PO CPDR: 20.0000 mg | DELAYED_RELEASE_CAPSULE | Freq: Every day | ORAL | Status: DC

## 2013-02-26 MED ORDER — TADALAFIL 20 MG PO TABS: 20.0000 mg | ORAL_TABLET | Freq: Every day | ORAL | Status: DC | PRN

## 2013-02-26 MED ORDER — ROSUVASTATIN CALCIUM 5 MG PO TABS: 2.5000 mg | ORAL_TABLET | Freq: Every day | ORAL | Status: DC

## 2013-02-26 MED ORDER — ALPRAZOLAM 0.25 MG PO TABS: 0.2500 mg | ORAL_TABLET | Freq: Every evening | ORAL | Status: DC | PRN

## 2013-02-26 NOTE — Progress Notes (Signed)
Patient ID: Jose [name], male   DOB: 02/13/1941, 72 y.o.   MRN: [id number]  Pt with cad, lipids, anxiety, hyperglycemia  See acute gi bleed-- diverticulosis.  Past Medical History  Diagnosis Date  . ANXIETY 08/22/2008  . Barrett's esophagus 2003  . CORONARY ARTERY DISEASE 2001    LAD stent per Dr Chales Abrahams in [place]  . DIVERTICULOSIS, COLON 2003  . ERECTILE DYSFUNCTION 08/22/2008  . HYPERLIPIDEMIA 02/09/2007  . MYOCARDIAL INFARCTION, HX OF 2001  . NEPHROLITHIASIS, HX OF 02/09/2007    s/p lithotripsy  . OBESITY 03/20/2010  . Hematochezia     episodic since at least 2003  . Squamous cell cancer of skin of earlobe     removed from area behind right earlobe  . Basal cell cancer     removed from right forearm.     History   Social History  . Marital Status: Married    Spouse Name: N/A    Number of Children: N/A  . Years of Education: N/A   Occupational History  . Not on file.   Social History Main Topics  . Smoking status: Former Games developer  . Smokeless tobacco: Not on file  . Alcohol Use: No  . Drug Use: No  . Sexually Active: Not on file   Other Topics Concern  . Not on file   Social History Narrative  . No narrative on file    Past Surgical History  Procedure Laterality Date  . Lithotripsy    . Tonsilectomy, adenoidectomy, bilateral myringotomy and tubes    . Skin cancer excision      right forearm and posterior right ear.   . Colonoscopy N/A 01/26/2013    Procedure: COLONOSCOPY;  Surgeon: Rachael Fee, MD;  Location: [facility] ENDOSCOPY;  Service: Endoscopy;  Laterality: N/A;    Family History  Problem Relation Age of Onset  . Alcohol abuse Father   . Liver disease Father   . Heart disease Brother     CABG    Allergies  Allergen Reactions  . Atorvastatin     REACTION: muscle weakness    Current Outpatient Prescriptions on File Prior to Visit  Medication Sig Dispense Refill  . ALPRAZolam (XANAX) 0.25 MG tablet Take 0.25 mg by mouth at bedtime as  needed for sleep.      Marland Kitchen aspirin 81 MG EC tablet Take 1 tablet (81 mg total) by mouth daily. Swallow whole.  30 tablet  12  . omeprazole (PRILOSEC) 20 MG capsule Take 20 mg by mouth daily.      . rosuvastatin (CRESTOR) 5 MG tablet Take 0.5 tablets (2.5 mg total) by mouth daily.  30 tablet  6  . tadalafil (CIALIS) 20 MG tablet Take 1 tablet (20 mg total) by mouth daily as needed.  10 tablet  5   No current facility-administered medications on file prior to visit.     patient denies chest pain, shortness of breath, orthopnea. Denies lower extremity edema, abdominal pain, change in appetite, change in bowel movements. Patient denies rashes, musculoskeletal complaints. No other specific complaints in a complete review of systems.   BP 124/70  Pulse 64  Temp(Src) 97.9 F (36.6 C) (Oral)  Ht 5' 8.5" (1.74 m)  Wt 208 lb (94.348 kg)  BMI 31.16 kg/m2   well-developed well-nourished male in no acute distress. HEENT exam atraumatic, normocephalic, neck supple without jugular venous distention. Chest clear to auscultation cardiac exam S1-S2 are regular. Abdominal exam overweight with bowel sounds, soft and nontender.  Extremities no edema. Neurologic exam is alert with a normal gait.

## 2013-02-26 NOTE — Assessment & Plan Note (Signed)
Check labs today.

## 2013-02-26 NOTE — Assessment & Plan Note (Signed)
Refilled meds today.

## 2013-02-26 NOTE — Assessment & Plan Note (Signed)
No sxs Continue risk factor modification 

## 2013-02-26 NOTE — Addendum Note (Signed)
Addended by: Alfred Levins D on: 02/26/2013 08:17 AM   Modules accepted: Orders

## 2013-02-26 NOTE — Assessment & Plan Note (Signed)
Ok to use xanax prn

## 2013-02-28 ENCOUNTER — Encounter: Payer: Medicare Other | Admitting: Internal Medicine

## 2013-05-11 ENCOUNTER — Encounter: Payer: Self-pay | Admitting: Gastroenterology

## 2013-05-25 ENCOUNTER — Other Ambulatory Visit: Payer: Self-pay | Admitting: Internal Medicine

## 2013-05-28 DIAGNOSIS — J209 Acute bronchitis, unspecified: Secondary | ICD-10-CM | POA: Diagnosis not present

## 2013-06-06 DIAGNOSIS — H6120 Impacted cerumen, unspecified ear: Secondary | ICD-10-CM | POA: Diagnosis not present

## 2013-06-21 ENCOUNTER — Ambulatory Visit (AMBULATORY_SURGERY_CENTER): Payer: Self-pay | Admitting: *Deleted

## 2013-06-21 VITALS — Ht 69.5 in | Wt 208.2 lb

## 2013-06-21 DIAGNOSIS — K2270 Barrett's esophagus without dysplasia: Secondary | ICD-10-CM

## 2013-06-21 DIAGNOSIS — H902 Conductive hearing loss, unspecified: Secondary | ICD-10-CM | POA: Diagnosis not present

## 2013-06-21 DIAGNOSIS — H66009 Acute suppurative otitis media without spontaneous rupture of ear drum, unspecified ear: Secondary | ICD-10-CM | POA: Diagnosis not present

## 2013-06-21 NOTE — Progress Notes (Signed)
No allergies to eggs or soy. No problems with anesthesia.  

## 2013-07-06 ENCOUNTER — Ambulatory Visit (AMBULATORY_SURGERY_CENTER): Payer: Medicare Other | Admitting: Gastroenterology

## 2013-07-06 ENCOUNTER — Encounter: Payer: Self-pay | Admitting: Gastroenterology

## 2013-07-06 VITALS — BP 146/80 | HR 49 | Temp 96.3°F | Resp 21 | Ht 69.5 in | Wt 208.0 lb

## 2013-07-06 DIAGNOSIS — K2270 Barrett's esophagus without dysplasia: Secondary | ICD-10-CM

## 2013-07-06 DIAGNOSIS — I252 Old myocardial infarction: Secondary | ICD-10-CM | POA: Diagnosis not present

## 2013-07-06 DIAGNOSIS — F411 Generalized anxiety disorder: Secondary | ICD-10-CM | POA: Diagnosis not present

## 2013-07-06 DIAGNOSIS — I10 Essential (primary) hypertension: Secondary | ICD-10-CM | POA: Diagnosis not present

## 2013-07-06 DIAGNOSIS — Z1211 Encounter for screening for malignant neoplasm of colon: Secondary | ICD-10-CM | POA: Diagnosis not present

## 2013-07-06 MED ORDER — SODIUM CHLORIDE 0.9 % IV SOLN: 500.0000 mL | INTRAVENOUS | Status: DC

## 2013-07-06 NOTE — Progress Notes (Signed)
Report to pacu rn, vss, bbs=clear 

## 2013-07-06 NOTE — Progress Notes (Signed)
Called to room to assist during endoscopic procedure.  Patient ID and intended procedure confirmed with present staff. Received instructions for my participation in the procedure from the performing physician.  

## 2013-07-06 NOTE — Progress Notes (Signed)
Patient did not experience any of the following events: a burn prior to discharge; a fall within the facility; wrong site/side/patient/procedure/implant event; or a hospital transfer or hospital admission upon discharge from the facility. (G8907)  

## 2013-07-06 NOTE — Patient Instructions (Signed)
YOU HAD AN ENDOSCOPIC PROCEDURE TODAY AT THE [facility]: Refer to the procedure report that was given to you for any specific questions about what was found during the examination.  If the procedure report does not answer your questions, please call your gastroenterologist to clarify.  If you requested that your care partner not be given the details of your procedure findings, then the procedure report has been included in a sealed envelope for you to review at your convenience later.  YOU SHOULD EXPECT: Some feelings of bloating in the abdomen. Passage of more gas than usual.  Walking can help get rid of the air that was put into your GI tract during the procedure and reduce the bloating. If you had a lower endoscopy (such as a colonoscopy or flexible sigmoidoscopy) you may notice spotting of blood in your stool or on the toilet paper. If you underwent a bowel prep for your procedure, then you may not have a normal bowel movement for a few days.  DIET: Your first meal following the procedure should be a light meal and then it is ok to progress to your normal diet.  A half-sandwich or bowl of soup is an example of a good first meal.  Heavy or fried foods are harder to digest and may make you feel nauseous or bloated.  Likewise meals heavy in dairy and vegetables can cause extra gas to form and this can also increase the bloating.  Drink plenty of fluids but you should avoid alcoholic beverages for 24 hours.  ACTIVITY: Your care partner should take you home directly after the procedure.  You should plan to take it easy, moving slowly for the rest of the day.  You can resume normal activity the day after the procedure however you should NOT DRIVE or use heavy machinery for 24 hours (because of the sedation medicines used during the test).    SYMPTOMS TO REPORT IMMEDIATELY: A gastroenterologist can be reached at any hour.  During normal business hours, 8:30 AM to 5:00 PM Monday through Friday,  call [phone].  After hours and on weekends, please call the GI answering service at [phone] who will take a message and have the physician on call contact you.   Following upper endoscopy (EGD)  Vomiting of blood or coffee ground material  New chest pain or pain under the shoulder blades  Painful or persistently difficult swallowing  New shortness of breath  Fever of 100F or higher  Black, tarry-looking stools  FOLLOW UP: If any biopsies were taken you will be contacted by phone or by letter within the next 1-3 weeks.  Call your gastroenterologist if you have not heard about the biopsies in 3 weeks.  Our staff will call the home number listed on your records the next business day following your procedure to check on you and address any questions or concerns that you may have at that time regarding the information given to you following your procedure. This is a courtesy call and so if there is no answer at the home number and we have not heard from you through the emergency physician on call, we will assume that you have returned to your regular daily activities without incident.  SIGNATURES/CONFIDENTIALITY: You and/or your care partner have signed paperwork which will be entered into your electronic medical record.  These signatures attest to the fact that that the information above on your After Visit Summary has been reviewed and is understood.  Full responsibility   of the confidentiality of this discharge information lies with you and/or your care-partner.  Recommendations See procedure report 

## 2013-07-06 NOTE — Op Note (Signed)
[facility] [address] [place], [address]   ENDOSCOPY PROCEDURE REPORT  PATIENT: Jose [name], Jose [name]  MR#: [id number] BIRTHDATE: 1940-12-02 , 71  yrs. old GENDER: Male ENDOSCOPIST: Meryl Dare, MD, [facility] PROCEDURE DATE:  07/06/2013 PROCEDURE:  EGD w/ biopsy ASA CLASS:     Class II INDICATIONS:  history of Barrett's esophagus. MEDICATIONS: MAC sedation, administered by CRNA and propofol (Diprivan) 130mg  IV TOPICAL ANESTHETIC: Cetacaine Spray DESCRIPTION OF PROCEDURE: After the risks benefits and alternatives of the procedure were thoroughly explained, informed consent was obtained.  The [id number] endoscope was introduced through the mouth and advanced to the second portion of the duodenum  without limitations.  The instrument was slowly withdrawn as the mucosa was fully examined.  ESOPHAGUS: There was evidence of Barrett's esophagus in the lower third of the esophagus from 33-37 cm.  Multiple biopsies were performed.   The esophagus was otherwise normal. STOMACH: The mucosa and folds of the stomach appeared normal. DUODENUM: The duodenal mucosa showed no abnormalities in the bulb and second portion of the duodenum. Retroflexed views revealed a 4 cm hiatal hernia.  The scope was then withdrawn from the patient and the procedure completed.  COMPLICATIONS: There were no complications.  ENDOSCOPIC IMPRESSION: 1.   Barrett's esophagus; multiple biopsies 2.   Hiatal hernia  RECOMMENDATIONS: 1.  Anti-reflux regimen 2.  Await pathology results 3.  Continue PPI 4.  Endoscopy in 3 years if no dysplasia  eSigned:  Meryl Dare, MD, [facility] 07/06/2013 3:15 PM

## 2013-07-09 ENCOUNTER — Telehealth: Payer: Self-pay | Admitting: *Deleted

## 2013-07-09 NOTE — Telephone Encounter (Signed)
  Follow up Call-  Call back number 07/06/2013  Post procedure Call Back phone  # [phone]  Permission to leave phone message Yes     No answer,left message.

## 2013-07-12 ENCOUNTER — Encounter: Payer: Self-pay | Admitting: Gastroenterology

## 2013-08-17 DIAGNOSIS — Z23 Encounter for immunization: Secondary | ICD-10-CM | POA: Diagnosis not present

## 2013-08-17 DIAGNOSIS — R0789 Other chest pain: Secondary | ICD-10-CM | POA: Diagnosis not present

## 2013-08-17 DIAGNOSIS — E785 Hyperlipidemia, unspecified: Secondary | ICD-10-CM | POA: Diagnosis not present

## 2013-08-17 DIAGNOSIS — I2510 Atherosclerotic heart disease of native coronary artery without angina pectoris: Secondary | ICD-10-CM | POA: Diagnosis not present

## 2013-09-12 ENCOUNTER — Other Ambulatory Visit: Payer: Self-pay | Admitting: Internal Medicine

## 2013-09-14 ENCOUNTER — Telehealth: Payer: Self-pay | Admitting: Internal Medicine

## 2013-09-14 NOTE — Telephone Encounter (Signed)
Pt called to fu on cialis rx. Informed pt it was approved and sent to pharm

## 2013-12-12 ENCOUNTER — Other Ambulatory Visit: Payer: Self-pay | Admitting: Internal Medicine

## 2014-01-12 ENCOUNTER — Other Ambulatory Visit: Payer: Self-pay | Admitting: Internal Medicine

## 2014-02-22 ENCOUNTER — Other Ambulatory Visit: Payer: Self-pay | Admitting: Internal Medicine

## 2014-03-13 ENCOUNTER — Other Ambulatory Visit: Payer: Self-pay | Admitting: Internal Medicine

## 2014-03-14 ENCOUNTER — Telehealth: Payer: Self-pay | Admitting: Internal Medicine

## 2014-03-14 NOTE — Telephone Encounter (Signed)
Pt has made appt w/ dr hunter for 7/31.  Pt ask if you would refill his ALPRAZolam (XANAX) 0.25 MG tablet and   CIALIS 20 MG tablet [facility] [address]

## 2014-03-14 NOTE — Telephone Encounter (Signed)
Ok to fill 

## 2014-03-15 ENCOUNTER — Encounter: Payer: Medicare Other | Admitting: Internal Medicine

## 2014-03-15 MED ORDER — ALPRAZOLAM 0.25 MG PO TABS: ORAL_TABLET | ORAL | Status: DC

## 2014-03-15 MED ORDER — TADALAFIL 20 MG PO TABS: ORAL_TABLET | ORAL | Status: DC

## 2014-03-15 NOTE — Telephone Encounter (Signed)
Ok for one month.

## 2014-03-15 NOTE — Telephone Encounter (Signed)
Rx called/sent

## 2014-03-29 ENCOUNTER — Ambulatory Visit (INDEPENDENT_AMBULATORY_CARE_PROVIDER_SITE_OTHER): Payer: Medicare Other | Admitting: Family Medicine

## 2014-03-29 ENCOUNTER — Encounter: Payer: Self-pay | Admitting: Family Medicine

## 2014-03-29 VITALS — BP 142/86 | HR 52 | Wt 204.0 lb

## 2014-03-29 DIAGNOSIS — R739 Hyperglycemia, unspecified: Secondary | ICD-10-CM

## 2014-03-29 DIAGNOSIS — I2510 Atherosclerotic heart disease of native coronary artery without angina pectoris: Secondary | ICD-10-CM

## 2014-03-29 DIAGNOSIS — K2270 Barrett's esophagus without dysplasia: Secondary | ICD-10-CM

## 2014-03-29 DIAGNOSIS — F411 Generalized anxiety disorder: Secondary | ICD-10-CM

## 2014-03-29 DIAGNOSIS — E669 Obesity, unspecified: Secondary | ICD-10-CM

## 2014-03-29 DIAGNOSIS — R030 Elevated blood-pressure reading, without diagnosis of hypertension: Secondary | ICD-10-CM

## 2014-03-29 DIAGNOSIS — R7309 Other abnormal glucose: Secondary | ICD-10-CM | POA: Diagnosis not present

## 2014-03-29 DIAGNOSIS — IMO0001 Reserved for inherently not codable concepts without codable children: Secondary | ICD-10-CM | POA: Insufficient documentation

## 2014-03-29 DIAGNOSIS — E785 Hyperlipidemia, unspecified: Secondary | ICD-10-CM

## 2014-03-29 DIAGNOSIS — I10 Essential (primary) hypertension: Secondary | ICD-10-CM | POA: Insufficient documentation

## 2014-03-29 DIAGNOSIS — F528 Other sexual dysfunction not due to a substance or known physiological condition: Secondary | ICD-10-CM

## 2014-03-29 DIAGNOSIS — K5730 Diverticulosis of large intestine without perforation or abscess without bleeding: Secondary | ICD-10-CM

## 2014-03-29 LAB — COMPREHENSIVE METABOLIC PANEL
ALBUMIN: 3.8 g/dL (ref 3.5–5.2)
ALT: 20 U/L (ref 0–53)
AST: 23 U/L (ref 0–37)
Alkaline Phosphatase: 89 U/L (ref 39–117)
BUN: 14 mg/dL (ref 6–23)
CALCIUM: 9.1 mg/dL (ref 8.4–10.5)
CO2: 25 meq/L (ref 19–32)
CREATININE: 1 mg/dL (ref 0.4–1.5)
Chloride: 106 mEq/L (ref 96–112)
GFR: 82.16 mL/min (ref >60.00)
GLUCOSE: 92 mg/dL (ref 70–99)
Potassium: 4.3 mEq/L (ref 3.5–5.1)
Sodium: 139 mEq/L (ref 135–145)
Total Bilirubin: 1 mg/dL (ref 0.2–1.2)
Total Protein: 7.3 g/dL (ref 6.0–8.3)

## 2014-03-29 LAB — CBC
HCT: 44.2 % (ref 39.0–52.0)
HEMOGLOBIN: 14.7 g/dL (ref 13.0–17.0)
MCHC: 33.3 g/dL (ref 30.0–36.0)
MCV: 85.3 fl (ref 78.0–100.0)
Platelets: 182 10*3/uL (ref 150.0–400.0)
RBC: 5.18 Mil/uL (ref 4.22–5.81)
RDW: 15 % (ref 11.5–15.5)
WBC: 4.9 10*3/uL (ref 4.0–10.5)

## 2014-03-29 LAB — LIPID PANEL
Cholesterol: 146 mg/dL (ref 0–200)
HDL: 40.5 mg/dL (ref >39.00)
LDL Cholesterol: 96 mg/dL (ref 0–99)
NonHDL: 105.5
TRIGLYCERIDES: 50 mg/dL (ref 0.0–149.0)
Total CHOL/HDL Ratio: 4
VLDL: 10 mg/dL (ref 0.0–40.0)

## 2014-03-29 LAB — HEMOGLOBIN A1C: Hgb A1c MFr Bld: 6 % (ref 4.6–6.5)

## 2014-03-29 MED ORDER — ALPRAZOLAM 0.25 MG PO TABS: ORAL_TABLET | ORAL | Status: DC

## 2014-03-29 NOTE — Patient Instructions (Addendum)
Erectile Dysfunction- if you are ever within 24 hours of a cialis and have chest pain, do not take nitroglycerin  Anxiety-1 month refill at this point. See me back in a month to further investigate your history of anxiety and some depressive symptoms. I do not prescribe chronic xanax but we can explore other options.   Heart disease-continue to follow with cardiology. Check cholesterol today.   Blood pressure-very mild elevation. i would encourage 150 minutes of exercise a week (start with a goal of 60 per week and work up as long as no chest pain or shortness of breath). Let's check back in within a month to reevaluate.   Plan next visit-anxiety follow up, blood pressure, skin exam  Health Maintenance Due  Topic Date Due  . Zostavax  08/22/2003  . Tetanus/tdap  08/30/2013

## 2014-03-29 NOTE — Assessment & Plan Note (Signed)
Discussed to never use nitrates and cialis within 24 hour period together. If were to have chest pain, should seek care immediately and not take nitro after taking cialis.

## 2014-03-29 NOTE — Assessment & Plan Note (Addendum)
Followed by Gi. q 3 year endoscopies. Continue PPI.

## 2014-03-29 NOTE — Assessment & Plan Note (Signed)
Discussed healthy lifestyle choices and DASH diet in hopes to help with blood pressure.

## 2014-03-29 NOTE — Assessment & Plan Note (Signed)
LDL at 96. Goal 100 but more ideal goal 70 with history CAD. Perhaps could take full pill instead of 1/2 every other day. Will send mychart message.

## 2014-03-29 NOTE — Progress Notes (Signed)
Jose Reddish, MD Phone: [phone]  Subjective:  Patient presents today to establish care with me as PCP. Chief complaint-noted.   CAD-2 stents. Follows with cardiology HYperlipidemia- taking crestor without difficulty. Takes every other day most of the time ED-intermittently takes cialis, knows not to take with chest pain Barrett's esophagus-using omeprazole. q3 year endoscopies with GI.  Anxiety-discussed I do not use xanax for anxiety chronically. Patient still would like to continue under my care. Need to have full evaluation as patient does complain of "feeling down" at times that xanax helps.  Elevated Blood Pressure  BP Readings from Last 3 Encounters:  03/29/14 142/86  07/06/13 146/80  02/26/13 124/70  Home BP monitoring-checked once at home and was 150 but usually 120 when checks Compliant with medications-no current medications  ROS-Denies any CP, HA, SOB, blurry vision, LE edema.  The following were reviewed and entered/updated in epic: Past Medical History  Diagnosis Date  . ANXIETY 08/22/2008  . Barrett's esophagus 2003    hematochezia as a result since 2003  . CORONARY ARTERY DISEASE 2001    LAD stent per Dr Elonda Husky in [place]. 2 stents.   Marland Kitchen DIVERTICULOSIS, COLON 2003  . ERECTILE DYSFUNCTION 08/22/2008  . HYPERLIPIDEMIA 02/09/2007  . MYOCARDIAL INFARCTION, HX OF 2001  . NEPHROLITHIASIS, HX OF 02/09/2007    s/p lithotripsy multiple times  . OBESITY 03/20/2010  . Squamous cell cancer of skin of earlobe     removed from area behind right earlobe  . Basal cell cancer     removed from right forearm.   . Acute lower GI bleeding 01/25/2013    diverticular bleed   Patient Active Problem List   Diagnosis Date Noted  . CORONARY ARTERY DISEASE 02/09/2007    Priority: High  . ANXIETY 08/22/2008    Priority: Medium  . HYPERLIPIDEMIA 02/09/2007    Priority: Medium  . Barrett's esophagus 02/09/2007    Priority: Medium  . Elevated blood pressure 03/29/2014   Priority: Low  . Hyperglycemia 02/26/2013    Priority: Low  . OBESITY 03/20/2010    Priority: Low  . ERECTILE DYSFUNCTION 08/22/2008    Priority: Low  . DIVERTICULOSIS, COLON 02/09/2007    Priority: Low   Past Surgical History  Procedure Laterality Date  . Lithotripsy    . Tonsilectomy, adenoidectomy, bilateral myringotomy and tubes    . Skin cancer excision      right forearm and posterior right ear.   . Colonoscopy N/A 01/26/2013    Procedure: COLONOSCOPY;  Surgeon: Milus Banister, MD;  Location: [facility];  Service: Endoscopy;  Laterality: N/A;    Family History  Problem Relation Age of Onset  . Alcohol abuse Father   . Liver disease Father   . Heart disease Brother     CABG  . Colon cancer Neg Hx     Medications- reviewed and updated Current Outpatient Prescriptions  Medication Sig Dispense Refill  . ALPRAZolam (XANAX) 0.25 MG tablet TAKE 1 TABLET BY MOUTH AT BEDTIME AS NEEDED  30 tablet  0  . aspirin 81 MG EC tablet Take 1 tablet (81 mg total) by mouth daily. Swallow whole.  30 tablet  12  . omeprazole (PRILOSEC) 20 MG capsule TAKE 1 CAPSULE DAILY  90 capsule  0  . rosuvastatin (CRESTOR) 5 MG tablet Take 0.5 tablets (2.5 mg total) by mouth daily.  30 tablet  6  . tadalafil (CIALIS) 20 MG tablet TAKE 1 TABLET BY MOUTH DAILY AS NEEDED  10 tablet  0   No current facility-administered medications for this visit.    Allergies-reviewed and updated Allergies  Allergen Reactions  . Atorvastatin     REACTION: muscle weakness    History   Social History  . Marital Status: Married    Spouse Name: N/A    Number of Children: N/A  . Years of Education: N/A   Social History Main Topics  . Smoking status: Former Smoker -- 1.00 packs/day for 20 years    Types: Cigarettes    Quit date: 08/31/1983  . Smokeless tobacco: Never Used  . Alcohol Use: Yes     Comment: drinks 1 beer a month  . Drug Use: No  . Sexual Activity: Not on file   Other Topics Concern  . Not  on file   Social History Narrative   Still working at [employer]   Married 35 years in 2015, 4 kids from first marriage, 3 grandkids   Hobbies: woodworking, tv movies- old action movies like Rambo   ROS--See HPI   Objective: BP 142/86  Pulse 52  Wt 204 lb (92.534 kg)  SpO2 96% Gen: NAD, resting comfortably on table HEENT: Mucous membranes are moist. Oropharynx normal Neck: no thyromegaly CV: RRR no murmurs rubs or gallops Lungs: CTAB no crackles, wheeze, rhonchi Abdomen: soft/nontender/nondistended/normal bowel sounds. No rebound or guarding.  Ext: no edema Skin: warm, dry Neuro: grossly normal, moves all extremities, PERRLA  Results for orders placed in visit on 03/29/14 (from the past 24 hour(s))  CBC     Status: None   Collection Time    03/29/14  9:18 AM      Result Value Ref Range   WBC 4.9  4.0 - 10.5 K/uL   RBC 5.18  4.22 - 5.81 Mil/uL   Platelets 182.0  150.0 - 400.0 K/uL   Hemoglobin 14.7  13.0 - 17.0 g/dL   HCT 44.2  39.0 - 52.0 %   MCV 85.3  78.0 - 100.0 fl   MCHC 33.3  30.0 - 36.0 g/dL   RDW 15.0  11.5 - 15.5 %  COMPREHENSIVE METABOLIC PANEL     Status: None   Collection Time    03/29/14  9:18 AM      Result Value Ref Range   Sodium 139  135 - 145 mEq/L   Potassium 4.3  3.5 - 5.1 mEq/L   Chloride 106  96 - 112 mEq/L   CO2 25  19 - 32 mEq/L   Glucose, Bld 92  70 - 99 mg/dL   BUN 14  6 - 23 mg/dL   Creatinine, Ser 1.0  0.4 - 1.5 mg/dL   Total Bilirubin 1.0  0.2 - 1.2 mg/dL   Alkaline Phosphatase 89  39 - 117 U/L   AST 23  0 - 37 U/L   ALT 20  0 - 53 U/L   Total Protein 7.3  6.0 - 8.3 g/dL   Albumin 3.8  3.5 - 5.2 g/dL   Calcium 9.1  8.4 - 10.5 mg/dL   GFR 82.16  >60.00 mL/min  LIPID PANEL     Status: None   Collection Time    03/29/14  9:18 AM      Result Value Ref Range   Cholesterol 146  0 - 200 mg/dL   Triglycerides 50.0  0.0 - 149.0 mg/dL   HDL 40.50  >39.00 mg/dL   VLDL 10.0  0.0 - 40.0 mg/dL   LDL Cholesterol 96  0 - 99 mg/dL  Total CHOL/HDL Ratio 4     NonHDL 105.50    HEMOGLOBIN A1C     Status: None   Collection Time    03/29/14  9:18 AM      Result Value Ref Range   Hemoglobin A1C 6.0  4.6 - 6.5 %   Assessment/Plan:  Barrett's esophagus Followed by Gi. q 3 year endoscopies. Continue PPI.   CORONARY ARTERY DISEASE No angina. Continue crestor, BP control.   ERECTILE DYSFUNCTION Discussed to never use nitrates and cialis within 24 hour period together. If were to have chest pain, should seek care immediately and not take nitro after taking cialis.   Hyperglycemia Noted on prior cmet. Check a1c. Previously not elevated. Check yearly.   HYPERLIPIDEMIA LDL at 96. Goal 100 but more ideal goal 70 with history CAD. Perhaps could take full pill instead of 1/2 every other day. Will send mychart message.   OBESITY Discussed healthy lifestyle choices and DASH diet in hopes to help with blood pressure.   Elevated blood pressure Systolic elevated today at 142. Goal 140 given CAD. We discussed using DASH diet and regular exercise as long as no chest pain or shortness of breath. Patient to attempt and follow up within 1 month.   ANXIETY Discussed that I do not use chronic benzos for anxiety. We will give 1 month and see patient back that time. Patient agreeable to try new medication such as SSRI and knows if ultimately needs xanax, will need to refer to psychiatry.    Prostate cancer screening-no urologic symptoms. Agreeable to hold off on screening.   Meds ordered this encounter  Medications  . ALPRAZolam (XANAX) 0.25 MG tablet    Sig: TAKE 1 TABLET BY MOUTH AT BEDTIME AS NEEDED    Dispense:  30 tablet    Refill:  0   Plan next visit-anxiety follow up (full GAD assessment), blood pressure, skin exam

## 2014-03-29 NOTE — Assessment & Plan Note (Signed)
Systolic elevated today at 142. Goal 140 given CAD. We discussed using DASH diet and regular exercise as long as no chest pain or shortness of breath. Patient to attempt and follow up within 1 month.

## 2014-03-29 NOTE — Assessment & Plan Note (Addendum)
Noted on prior cmet. Check a1c-at 6 today up from 5.6 placing in at risk for diabetes category. Previously not elevated. Check yearly.

## 2014-03-29 NOTE — Assessment & Plan Note (Signed)
Discussed that I do not use chronic benzos for anxiety. We will give 1 month and see patient back that time. Patient agreeable to try new medication such as SSRI and knows if ultimately needs xanax, will need to refer to psychiatry.

## 2014-03-29 NOTE — Assessment & Plan Note (Signed)
No angina. Continue crestor, BP control.

## 2014-03-29 NOTE — Progress Notes (Signed)
Pre visit review using our clinic review tool, if applicable. No additional management support is needed unless otherwise documented below in the visit note. 

## 2014-04-08 ENCOUNTER — Telehealth: Payer: Self-pay | Admitting: Family Medicine

## 2014-04-08 NOTE — Telephone Encounter (Signed)
Pt returned called and would like a call back 

## 2014-04-09 NOTE — Telephone Encounter (Signed)
Called and lm for pt TCB

## 2014-04-12 ENCOUNTER — Encounter: Payer: Self-pay | Admitting: *Deleted

## 2014-04-21 ENCOUNTER — Other Ambulatory Visit: Payer: Self-pay | Admitting: Internal Medicine

## 2014-04-22 NOTE — Telephone Encounter (Signed)
Is this ok to refill?  

## 2014-05-17 DIAGNOSIS — N201 Calculus of ureter: Secondary | ICD-10-CM | POA: Diagnosis not present

## 2014-05-17 DIAGNOSIS — N200 Calculus of kidney: Secondary | ICD-10-CM | POA: Diagnosis not present

## 2014-05-17 DIAGNOSIS — N1330 Unspecified hydronephrosis: Secondary | ICD-10-CM | POA: Diagnosis not present

## 2014-05-17 DIAGNOSIS — K8020 Calculus of gallbladder without cholecystitis without obstruction: Secondary | ICD-10-CM | POA: Diagnosis not present

## 2014-05-17 DIAGNOSIS — R3129 Other microscopic hematuria: Secondary | ICD-10-CM | POA: Diagnosis not present

## 2014-05-28 ENCOUNTER — Other Ambulatory Visit: Payer: Self-pay | Admitting: Internal Medicine

## 2014-06-05 DIAGNOSIS — H6123 Impacted cerumen, bilateral: Secondary | ICD-10-CM | POA: Diagnosis not present

## 2014-06-13 ENCOUNTER — Telehealth: Payer: Self-pay | Admitting: Family Medicine

## 2014-06-13 NOTE — Telephone Encounter (Signed)
From patient AVS from July visit: Anxiety-1 month refill at this point. See me back in a month to further investigate your history of anxiety and some depressive symptoms. I do not prescribe chronic xanax but we can explore other options.   Addendum: I would consider it once nightly for insomnia but would still want to evalute patient with office visit more thoroughly.

## 2014-06-13 NOTE — Telephone Encounter (Signed)
Is this ok to refill?  

## 2014-06-13 NOTE — Telephone Encounter (Signed)
LM on pt VM with info

## 2014-06-13 NOTE — Telephone Encounter (Signed)
Pt request refill of the following: ALPRAZolam (XANAX) 0.25 MG tablet    Phamacy: [facility]

## 2014-06-14 MED ORDER — TADALAFIL 20 MG PO TABS: 10.0000 mg | ORAL_TABLET | Freq: Every day | ORAL | Status: DC | PRN

## 2014-06-14 MED ORDER — ALPRAZOLAM 0.25 MG PO TABS: ORAL_TABLET | ORAL | Status: DC

## 2014-06-14 NOTE — Telephone Encounter (Signed)
Pt following up on refill request. Pt also wanted the  CIALIS 20 MG tablet w/ refills  Also xanex Per note, pt aware and [facility] appt [facility]

## 2014-06-14 NOTE — Telephone Encounter (Signed)
Medications refilled

## 2014-06-17 ENCOUNTER — Other Ambulatory Visit: Payer: Self-pay

## 2014-06-17 MED ORDER — TADALAFIL 20 MG PO TABS: 10.0000 mg | ORAL_TABLET | Freq: Every day | ORAL | Status: DC | PRN

## 2014-06-17 NOTE — Telephone Encounter (Signed)
Can you send the cialis [facility] [address] [facility]  [facility] will not do cialis

## 2014-06-18 NOTE — Telephone Encounter (Signed)
This was sent to [facility] [address]

## 2014-07-19 ENCOUNTER — Ambulatory Visit: Payer: Medicare Other | Admitting: Family Medicine

## 2014-08-07 ENCOUNTER — Encounter: Payer: Self-pay | Admitting: Family Medicine

## 2014-08-07 ENCOUNTER — Ambulatory Visit (INDEPENDENT_AMBULATORY_CARE_PROVIDER_SITE_OTHER): Payer: Medicare Other | Admitting: Family Medicine

## 2014-08-07 ENCOUNTER — Ambulatory Visit (INDEPENDENT_AMBULATORY_CARE_PROVIDER_SITE_OTHER): Payer: Medicare Other

## 2014-08-07 VITALS — BP 102/64 | HR 68 | Temp 98.0°F | Wt 213.0 lb

## 2014-08-07 DIAGNOSIS — L570 Actinic keratosis: Secondary | ICD-10-CM

## 2014-08-07 DIAGNOSIS — F411 Generalized anxiety disorder: Secondary | ICD-10-CM | POA: Diagnosis not present

## 2014-08-07 DIAGNOSIS — Z23 Encounter for immunization: Secondary | ICD-10-CM | POA: Diagnosis not present

## 2014-08-07 MED ORDER — ALPRAZOLAM 0.25 MG PO TABS: ORAL_TABLET | ORAL | Status: DC

## 2014-08-07 MED ORDER — TADALAFIL 20 MG PO TABS: 10.0000 mg | ORAL_TABLET | Freq: Every day | ORAL | Status: DC | PRN

## 2014-08-07 NOTE — Assessment & Plan Note (Signed)
Given patient does not want to take a daily medication, is on lowest dose xanax, and use approximately 50% of days-decision made that I would continue xanax for well controlled GAD as evidenced by GAD 7 of 2 today. Continue xanax at 3 only per week so 60 pills should last 20 weeks or approximately 5 months.

## 2014-08-07 NOTE — Assessment & Plan Note (Signed)
Cryotherapy to left forehead completed today.

## 2014-08-07 NOTE — Patient Instructions (Addendum)
We agreed to continue low dose xanax if used 3x or less per week. If any increasing frequency, worrisome side effects or concerns I had in the future then we would plan on stopping the medication. Cognitive Behavioral therapy (counseling) can be just as effective as medicine.   Health Maintenance Due  Topic Date Due  . ZOSTAVAX - call your insurance 08/22/2003  . TETANUS/TDAP -insurance does not always pay but if you get a cut/scrape you would need this 08/30/2013  . INFLUENZA VACCINE -  today 03/30/2014

## 2014-08-07 NOTE — Progress Notes (Signed)
[name], MD Phone: [phone]  Subjective:   Jose [name] is a 73 y.o. year old very pleasant male patient who presents with the following:  GAD -history of getting frustrated easily or worrisome for >5 years. He has been on xanax for several years prn 0.25mg  Describes 3-4 days a week use. Has never taken SSRI. Denies depressive symptoms today. He states some periods are worse than others such as when he lost his daughter last year and during the holidays.   PHQ9 of 4 with no feelings of feeling down #2.  GAD 7 controlled at 2  ROS-No SI or HI, no vivid dreams  Past Medical History- Patient Active Problem List   Diagnosis Date Noted  . CORONARY ARTERY DISEASE 02/09/2007    Priority: High  . Generalized anxiety disorder 08/22/2008    Priority: Medium  . HYPERLIPIDEMIA 02/09/2007    Priority: Medium  . Barrett's esophagus 02/09/2007    Priority: Medium  . Actinic keratosis 08/07/2014    Priority: Low  . Elevated blood pressure 03/29/2014    Priority: Low  . Hyperglycemia 02/26/2013    Priority: Low  . OBESITY 03/20/2010    Priority: Low  . ERECTILE DYSFUNCTION 08/22/2008    Priority: Low  . DIVERTICULOSIS, COLON 02/09/2007    Priority: Low   Medications- reviewed and updated Current Outpatient Prescriptions  Medication Sig Dispense Refill  . aspirin 81 MG EC tablet Take 1 tablet (81 mg total) by mouth daily. Swallow whole. 30 tablet 12  . rosuvastatin (CRESTOR) 5 MG tablet Take 0.5 tablets (2.5 mg total) by mouth daily. 30 tablet 6  . ALPRAZolam (XANAX) 0.25 MG tablet TAKE 1 TABLET BY MOUTH as needed once daily. Max 3 doses per week. 30 tablet 1  . omeprazole (PRILOSEC) 20 MG capsule TAKE 1 CAPSULE DAILY (NEEDS OFFICE VISIT) 90 capsule 0  . tadalafil (CIALIS) 20 MG tablet Take 0.5 tablets (10 mg total) by mouth daily as needed for erectile dysfunction. 8 tablet 5   No current facility-administered medications for this visit.    Objective: BP 102/64  mmHg  Pulse 68  Temp(Src) 98 F (36.7 C)  Wt 213 lb (96.616 kg) Gen: NAD, resting comfortably CV: RRR no murmurs rubs or gallops Lungs: CTAB no crackles, wheeze, rhonchi Abdomen: soft/nontender/nondistended/normal bowel sounds.   Ext: no edema Skin: warm, dry, 1x1 erythematous lesion with scale consistent with AK on left forehead Neuro: grossly normal, moves all extremities   Assessment/Plan:  Actinic keratosis Cryotherapy to left forehead completed today.   Generalized anxiety disorder Given patient does not want to take a daily medication, is on lowest dose xanax, and use approximately 50% of days-decision made that I would continue xanax for well controlled GAD as evidenced by GAD 7 of 2 today. Continue xanax at 3 only per week so 60 pills should last 20 weeks or approximately 5 months.    .>50% of 20 minute office visit was spent on counseling (benefits/risks of benzodiazepines/appropriate use) and coordination of care   Meds ordered this encounter  Medications  . ALPRAZolam (XANAX) 0.25 MG tablet    Sig: TAKE 1 TABLET BY MOUTH as needed once daily. Max 3 doses per week.    Dispense:  30 tablet    Refill:  1    Next fill available 12/2014  . tadalafil (CIALIS) 20 MG tablet    Sig: Take 0.5 tablets (10 mg total) by mouth daily as needed for erectile dysfunction.    Dispense:  8 tablet  Refill:  5

## 2014-08-28 ENCOUNTER — Other Ambulatory Visit: Payer: Self-pay | Admitting: Family Medicine

## 2014-10-07 DIAGNOSIS — I2510 Atherosclerotic heart disease of native coronary artery without angina pectoris: Secondary | ICD-10-CM | POA: Diagnosis not present

## 2014-10-07 DIAGNOSIS — E785 Hyperlipidemia, unspecified: Secondary | ICD-10-CM | POA: Diagnosis not present

## 2014-10-15 ENCOUNTER — Telehealth: Payer: Self-pay | Admitting: Gastroenterology

## 2014-10-15 ENCOUNTER — Inpatient Hospital Stay (HOSPITAL_COMMUNITY)
Admission: EM | Admit: 2014-10-15 | Discharge: 2014-10-18 | DRG: 378 | Disposition: A | Discharge status: Home / Self Care | Payer: Medicare Other | Attending: Internal Medicine | Admitting: Internal Medicine

## 2014-10-15 ENCOUNTER — Encounter (HOSPITAL_COMMUNITY): Payer: Self-pay | Admitting: Emergency Medicine

## 2014-10-15 ENCOUNTER — Ambulatory Visit: Payer: Medicare Other | Admitting: Physician Assistant

## 2014-10-15 DIAGNOSIS — Z85828 Personal history of other malignant neoplasm of skin: Secondary | ICD-10-CM

## 2014-10-15 DIAGNOSIS — Z955 Presence of coronary angioplasty implant and graft: Secondary | ICD-10-CM

## 2014-10-15 DIAGNOSIS — Z87891 Personal history of nicotine dependence: Secondary | ICD-10-CM

## 2014-10-15 DIAGNOSIS — K5731 Diverticulosis of large intestine without perforation or abscess with bleeding: Secondary | ICD-10-CM | POA: Diagnosis not present

## 2014-10-15 DIAGNOSIS — E785 Hyperlipidemia, unspecified: Secondary | ICD-10-CM | POA: Diagnosis not present

## 2014-10-15 DIAGNOSIS — I2510 Atherosclerotic heart disease of native coronary artery without angina pectoris: Secondary | ICD-10-CM | POA: Diagnosis not present

## 2014-10-15 DIAGNOSIS — I252 Old myocardial infarction: Secondary | ICD-10-CM

## 2014-10-15 DIAGNOSIS — F419 Anxiety disorder, unspecified: Secondary | ICD-10-CM | POA: Diagnosis present

## 2014-10-15 DIAGNOSIS — Z79899 Other long term (current) drug therapy: Secondary | ICD-10-CM

## 2014-10-15 DIAGNOSIS — K922 Gastrointestinal hemorrhage, unspecified: Secondary | ICD-10-CM | POA: Diagnosis present

## 2014-10-15 DIAGNOSIS — D62 Acute posthemorrhagic anemia: Secondary | ICD-10-CM | POA: Diagnosis not present

## 2014-10-15 DIAGNOSIS — K2270 Barrett's esophagus without dysplasia: Secondary | ICD-10-CM | POA: Diagnosis present

## 2014-10-15 DIAGNOSIS — K625 Hemorrhage of anus and rectum: Secondary | ICD-10-CM

## 2014-10-15 DIAGNOSIS — D696 Thrombocytopenia, unspecified: Secondary | ICD-10-CM | POA: Diagnosis present

## 2014-10-15 DIAGNOSIS — K5791 Diverticulosis of intestine, part unspecified, without perforation or abscess with bleeding: Secondary | ICD-10-CM | POA: Diagnosis not present

## 2014-10-15 DIAGNOSIS — Z7982 Long term (current) use of aspirin: Secondary | ICD-10-CM

## 2014-10-15 DIAGNOSIS — I10 Essential (primary) hypertension: Secondary | ICD-10-CM | POA: Diagnosis present

## 2014-10-15 LAB — CBC
HEMATOCRIT: 40.2 % (ref 39.0–52.0)
Hemoglobin: 13.7 g/dL (ref 13.0–17.0)
MCH: 29 pg (ref 26.0–34.0)
MCHC: 34.1 g/dL (ref 30.0–36.0)
MCV: 85.2 fL (ref 78.0–100.0)
Platelets: 143 10*3/uL — ABNORMAL LOW (ref 150–400)
RBC: 4.72 MIL/uL (ref 4.22–5.81)
RDW: 13.1 % (ref 11.5–15.5)
WBC: 5.9 10*3/uL (ref 4.0–10.5)

## 2014-10-15 LAB — CBC WITH DIFFERENTIAL/PLATELET
BASOS ABS: 0 10*3/uL (ref 0.0–0.1)
BASOS PCT: 1 % (ref 0–1)
EOS ABS: 0.1 10*3/uL (ref 0.0–0.7)
Eosinophils Relative: 1 % (ref 0–5)
HCT: 42.1 % (ref 39.0–52.0)
Hemoglobin: 14.5 g/dL (ref 13.0–17.0)
LYMPHS ABS: 1.2 10*3/uL (ref 0.7–4.0)
Lymphocytes Relative: 20 % (ref 12–46)
MCH: 29.1 pg (ref 26.0–34.0)
MCHC: 34.4 g/dL (ref 30.0–36.0)
MCV: 84.4 fL (ref 78.0–100.0)
Monocytes Absolute: 0.8 10*3/uL (ref 0.1–1.0)
Monocytes Relative: 13 % — ABNORMAL HIGH (ref 3–12)
NEUTROS PCT: 65 % (ref 43–77)
Neutro Abs: 3.7 10*3/uL (ref 1.7–7.7)
PLATELETS: 170 10*3/uL (ref 150–400)
RBC: 4.99 MIL/uL (ref 4.22–5.81)
RDW: 13 % (ref 11.5–15.5)
WBC: 5.7 10*3/uL (ref 4.0–10.5)

## 2014-10-15 LAB — TYPE AND SCREEN
ABO/RH(D): A POS
Antibody Screen: NEGATIVE

## 2014-10-15 LAB — BASIC METABOLIC PANEL
ANION GAP: 7 (ref 5–15)
BUN: 14 mg/dL (ref 6–23)
CALCIUM: 9.2 mg/dL (ref 8.4–10.5)
CO2: 24 mmol/L (ref 19–32)
CREATININE: 0.78 mg/dL (ref 0.50–1.35)
Chloride: 108 mmol/L (ref 96–112)
GFR calc Af Amer: >90 mL/min (ref >90)
GFR calc non Af Amer: 89 mL/min — ABNORMAL LOW (ref >90)
Glucose, Bld: 94 mg/dL (ref 70–99)
POTASSIUM: 4.2 mmol/L (ref 3.5–5.1)
Sodium: 139 mmol/L (ref 135–145)

## 2014-10-15 LAB — POC OCCULT BLOOD, ED: Fecal Occult Bld: POSITIVE — AB

## 2014-10-15 MED ORDER — SODIUM CHLORIDE 0.9 % IV SOLN
INTRAVENOUS | Status: DC
  Administered 2014-10-15 – 2014-10-18 (×5): via INTRAVENOUS

## 2014-10-15 MED ORDER — PANTOPRAZOLE SODIUM 40 MG PO TBEC
40.0000 mg | DELAYED_RELEASE_TABLET | Freq: Every day | ORAL | Status: DC
  Administered 2014-10-15 – 2014-10-18 (×4): 40 mg via ORAL
  Filled 2014-10-15 (×3): qty 1

## 2014-10-15 MED ORDER — ALPRAZOLAM 0.25 MG PO TABS: 0.2500 mg | ORAL_TABLET | Freq: Every evening | ORAL | Status: DC | PRN

## 2014-10-15 MED ORDER — SODIUM CHLORIDE 0.9 % IJ SOLN
3.0000 mL | Freq: Two times a day (BID) | INTRAMUSCULAR | Status: DC
  Administered 2014-10-15 – 2014-10-16 (×3): 3 mL via INTRAVENOUS

## 2014-10-15 MED ORDER — ROSUVASTATIN CALCIUM 5 MG PO TABS
2.5000 mg | ORAL_TABLET | Freq: Every day | ORAL | Status: DC
  Administered 2014-10-16 – 2014-10-18 (×3): 2.5 mg via ORAL
  Filled 2014-10-15 (×4): qty 0.5

## 2014-10-15 NOTE — Telephone Encounter (Signed)
Patient reports that this am had 4 episodes of bleeding.  First stool was dark blood and got lighter with each BM.  He has not had any further bleeding in 3 hours.  He will come in and see Nicoletta Ba PA today at 2:30.  Patient will need a CBC prior to the visit.  Discussed with Nicoletta Ba PA and she has decided that patient needs ER eval due to his history of diverticular bleed .  Patient notified and he agrees

## 2014-10-15 NOTE — ED Notes (Signed)
Pt c/o rectal bleeding from diverticulosis; pt sts hx of same

## 2014-10-15 NOTE — ED Notes (Signed)
Dr. Ernestina Patches, hospitalist, at the bedside.

## 2014-10-15 NOTE — ED Notes (Signed)
Attempted to start fluids, patient request IV site change.

## 2014-10-15 NOTE — ED Provider Notes (Addendum)
CSN: [id number]     Arrival date & time 10/15/14  1324 History   First MD Initiated Contact with Patient 10/15/14 1840     Chief Complaint  Patient presents with  . Rectal Bleeding     (Consider location/radiation/quality/duration/timing/severity/associated sxs/prior Treatment) Patient is a 74 y.o. male presenting with hematochezia. The history is provided by the patient.  Rectal Bleeding Associated symptoms: no abdominal pain, no epistaxis, no fever, no light-headedness and no vomiting   pt w hx colon diverticula c/o several episodes rectal bleeding this afternoon. Episodes from very dark blood to brighter blood. Describes of moderate volume. No specific exacerbating or alleviating factors. No pain.  Hx diverticula bleeding in past.  No melena. Denies abd pain. No nv. Takes baby asa q day, no other anticoag use. No faintness or dizziness. No cp or sob.      Past Medical History  Diagnosis Date  . ANXIETY 08/22/2008  . Barrett's esophagus 2003    hematochezia as a result since 2003  . CORONARY ARTERY DISEASE 2001    LAD stent per Dr Elonda Husky in [place]. 2 stents.   Marland Kitchen DIVERTICULOSIS, COLON 2003  . ERECTILE DYSFUNCTION 08/22/2008  . HYPERLIPIDEMIA 02/09/2007  . MYOCARDIAL INFARCTION, HX OF 2001  . NEPHROLITHIASIS, HX OF 02/09/2007    s/p lithotripsy multiple times  . OBESITY 03/20/2010  . Squamous cell cancer of skin of earlobe     removed from area behind right earlobe  . Basal cell cancer     removed from right forearm.   . Acute lower GI bleeding 01/25/2013    diverticular bleed   Past Surgical History  Procedure Laterality Date  . Lithotripsy    . Tonsilectomy, adenoidectomy, bilateral myringotomy and tubes    . Skin cancer excision      right forearm and posterior right ear.   . Colonoscopy N/A 01/26/2013    Procedure: COLONOSCOPY;  Surgeon: Milus Banister, MD;  Location: [facility];  Service: Endoscopy;  Laterality: N/A;   Family History  Problem Relation Age of  Onset  . Alcohol abuse Father   . Liver disease Father   . Heart disease Brother     CABG  . Colon cancer Neg Hx    History  Substance Use Topics  . Smoking status: Former Smoker -- 1.00 packs/day for 20 years    Types: Cigarettes    Quit date: 08/31/1983  . Smokeless tobacco: Never Used  . Alcohol Use: Yes     Comment: drinks 1 beer a month    Review of Systems  Constitutional: Negative for fever and chills.  HENT: Negative for nosebleeds.   Eyes: Negative for redness.  Respiratory: Negative for shortness of breath.   Cardiovascular: Negative for chest pain.  Gastrointestinal: Positive for blood in stool and hematochezia. Negative for nausea, vomiting and abdominal pain.  Genitourinary: Negative for hematuria.  Musculoskeletal: Negative for back pain and neck pain.  Skin: Negative for rash.  Neurological: Negative for syncope and light-headedness.  Hematological: Does not bruise/bleed easily.  Psychiatric/Behavioral: Negative for confusion.      Allergies  Atorvastatin  Home Medications   Prior to Admission medications   Medication Sig Start Date End Date Taking? Authorizing Provider  ALPRAZolam (XANAX) 0.25 MG tablet TAKE 1 TABLET BY MOUTH as needed once daily. Max 3 doses per week. 08/07/14   Marin Olp, MD  aspirin 81 MG EC tablet Take 1 tablet (81 mg total) by mouth daily. Swallow whole. 01/27/13  Amy S Esterwood, PA-C  omeprazole (PRILOSEC) 20 MG capsule TAKE 1 CAPSULE DAILY (NEEDS OFFICE VISIT ) 08/29/14   Marin Olp, MD  rosuvastatin (CRESTOR) 5 MG tablet Take 0.5 tablets (2.5 mg total) by mouth daily. 02/26/13   Lisabeth Pick, MD  tadalafil (CIALIS) 20 MG tablet Take 0.5 tablets (10 mg total) by mouth daily as needed for erectile dysfunction. 08/07/14   Marin Olp, MD   BP 110/66 mmHg  Pulse 50  Temp(Src) 97.2 F (36.2 C) (Oral)  Resp 21  Ht 5' 9.5" (1.765 m)  Wt 204 lb (92.534 kg)  BMI 29.70 kg/m2  SpO2 98% Physical Exam   Constitutional: He is oriented to person, place, and time. He appears well-developed and well-nourished. No distress.  HENT:  Mouth/Throat: Oropharynx is clear and moist.  Eyes: Conjunctivae are normal. No scleral icterus.  Neck: Neck supple. No tracheal deviation present.  Cardiovascular: Normal rate, regular rhythm, normal heart sounds and intact distal pulses.   Pulmonary/Chest: Effort normal and breath sounds normal. No accessory muscle usage. No respiratory distress.  Abdominal: Soft. Bowel sounds are normal. He exhibits no distension and no mass. There is no tenderness. There is no rebound and no guarding.  Genitourinary:  Dark blood on rectal, heme positive. No mass.   Musculoskeletal: Normal range of motion. He exhibits no edema or tenderness.  Neurological: He is alert and oriented to person, place, and time.  Skin: Skin is warm and dry. He is not diaphoretic. No pallor.  Psychiatric: He has a normal mood and affect.  Nursing note and vitals reviewed.   ED Course  Procedures (including critical care time) Labs Review  Results for orders placed or performed during the hospital encounter of 10/15/14  CBC with Differential  Result Value Ref Range   WBC 5.7 4.0 - 10.5 K/uL   RBC 4.99 4.22 - 5.81 MIL/uL   Hemoglobin 14.5 13.0 - 17.0 g/dL   HCT 42.1 39.0 - 52.0 %   MCV 84.4 78.0 - 100.0 fL   MCH 29.1 26.0 - 34.0 pg   MCHC 34.4 30.0 - 36.0 g/dL   RDW 13.0 11.5 - 15.5 %   Platelets 170 150 - 400 K/uL   Neutrophils Relative % 65 43 - 77 %   Neutro Abs 3.7 1.7 - 7.7 K/uL   Lymphocytes Relative 20 12 - 46 %   Lymphs Abs 1.2 0.7 - 4.0 K/uL   Monocytes Relative 13 (H) 3 - 12 %   Monocytes Absolute 0.8 0.1 - 1.0 K/uL   Eosinophils Relative 1 0 - 5 %   Eosinophils Absolute 0.1 0.0 - 0.7 K/uL   Basophils Relative 1 0 - 1 %   Basophils Absolute 0.0 0.0 - 0.1 K/uL  Basic metabolic panel  Result Value Ref Range   Sodium 139 135 - 145 mmol/L   Potassium 4.2 3.5 - 5.1 mmol/L    Chloride 108 96 - 112 mmol/L   CO2 24 19 - 32 mmol/L   Glucose, Bld 94 70 - 99 mg/dL   BUN 14 6 - 23 mg/dL   Creatinine, Ser 0.78 0.50 - 1.35 mg/dL   Calcium 9.2 8.4 - 10.5 mg/dL   GFR calc non Af Amer 89 (L) >90 mL/min   GFR calc Af Amer >90 >90 mL/min   Anion gap 7 5 - 15  POC occult blood, ED Provider will collect  Result Value Ref Range   Fecal Occult Bld POSITIVE (A) NEGATIVE  MDM   Iv ns.  Labs.  Given several episodes ongoing rectal bleeding, will admit to medical service.  Gi consulted - discussed pt with Dr Hilarie Fredrickson - they will see/consult.   Reviewed nursing notes and prior charts for additional history.      Mirna Mires, MD 10/15/14 [phone]

## 2014-10-15 NOTE — H&P (Signed)
Hospitalist Admission History and Physical  Patient name: Jose [name] Medical record number: [id number] Date of birth: 23-Oct-1940 Age: 74 y.o. Gender: male  Primary Care Provider: [name], MD  Chief Complaint: GIB   History of Present Illness:This is a 74 y.o. year old male with significant past medical history of diverticular disease, CAD, barretts esophagus, HTN, HLD presenting with GIB. Pt reports maroon colored stools x 6-7 since earlier this am. Denies abd pain, nausea, vomiting. On baby ASA. Otherwise denies NSAID use. No recent strenuous activity. Reports similar episode 1-2 years ago. Had colonoscopy that was non specific. Followed by [facility]. Contacted gastroenterologist who referred pt to ER.  Presents to ER hemodynamically stable, afebrile. Hemoccult positive. hgb 14.2.    Assessment and Plan: GERREN [name] is a 74 y.o. year old male presenting with GIB   Active Problems:   GIB (gastrointestinal bleeding)   1-GIB  -serial CBCs  -type and screen  -NPO  -cont PPI -hold baby ASA -per EDP- will call [facility] -f/u in am  2-HLD -cont statin  3- CAD  -no active CP  -tele bed   FEN/GI: NPO for now. PPI.  Prophylaxis: SCDs  Disposition: pending further evaluation  Code Status:Full Code    Patient Active Problem List   Diagnosis Date Noted  . GIB (gastrointestinal bleeding) 10/15/2014  . Actinic keratosis 08/07/2014  . Elevated blood pressure 03/29/2014  . Hyperglycemia 02/26/2013  . OBESITY 03/20/2010  . Generalized anxiety disorder 08/22/2008  . ERECTILE DYSFUNCTION 08/22/2008  . HYPERLIPIDEMIA 02/09/2007  . CORONARY ARTERY DISEASE 02/09/2007  . Barrett's esophagus 02/09/2007  . DIVERTICULOSIS, COLON 02/09/2007   Past Medical History: Past Medical History  Diagnosis Date  . ANXIETY 08/22/2008  . Barrett's esophagus 2003    hematochezia as a result since 2003  . CORONARY ARTERY DISEASE 2001    LAD stent per Dr Elonda Husky in [place]. 2 stents.   Marland Kitchen DIVERTICULOSIS, COLON 2003  . ERECTILE DYSFUNCTION 08/22/2008  . HYPERLIPIDEMIA 02/09/2007  . MYOCARDIAL INFARCTION, HX OF 2001  . NEPHROLITHIASIS, HX OF 02/09/2007    s/p lithotripsy multiple times  . OBESITY 03/20/2010  . Squamous cell cancer of skin of earlobe     removed from area behind right earlobe  . Basal cell cancer     removed from right forearm.   . Acute lower GI bleeding 01/25/2013    diverticular bleed    Past Surgical History: Past Surgical History  Procedure Laterality Date  . Lithotripsy    . Tonsilectomy, adenoidectomy, bilateral myringotomy and tubes    . Skin cancer excision      right forearm and posterior right ear.   . Colonoscopy N/A 01/26/2013    Procedure: COLONOSCOPY;  Surgeon: Milus Banister, MD;  Location: [facility];  Service: Endoscopy;  Laterality: N/A;    Social History: History   Social History  . Marital Status: Married    Spouse Name: N/A  . Number of Children: N/A  . Years of Education: N/A   Social History Main Topics  . Smoking status: Former Smoker -- 1.00 packs/day for 20 years    Types: Cigarettes    Quit date: 08/31/1983  . Smokeless tobacco: Never Used  . Alcohol Use: Yes     Comment: drinks 1 beer a month  . Drug Use: No  . Sexual Activity: Not on file   Other Topics Concern  . None   Social History Narrative   Still working at [employer]   Married 35 years in 2015, 4 kids from first marriage, 3 grandkids   Hobbies: woodworking, tv movies- old action movies like Rambo    Family History: Family History  Problem Relation Age of Onset  . Alcohol abuse Father   . Liver disease Father   . Heart disease Brother     CABG  . Colon cancer Neg Hx     Allergies: Allergies  Allergen Reactions  . Atorvastatin     REACTION: muscle weakness    Current Facility-Administered Medications  Medication Dose Route Frequency Provider Last Rate Last Dose  . 0.9 %  sodium chloride infusion    Intravenous Continuous Shanda Howells, MD      . ALPRAZolam Duanne Moron) tablet 0.25 mg  0.25 mg Oral QHS PRN Shanda Howells, MD      . pantoprazole (PROTONIX) EC tablet 40 mg  40 mg Oral Daily Shanda Howells, MD      . rosuvastatin (CRESTOR) tablet 2.5 mg  2.5 mg Oral Daily Shanda Howells, MD      . sodium chloride 0.9 % injection 3 mL  3 mL Intravenous Q12H Shanda Howells, MD       Current Outpatient Prescriptions  Medication Sig Dispense Refill  . ALPRAZolam (XANAX) 0.25 MG tablet TAKE 1 TABLET BY MOUTH as needed once daily. Max 3 doses per week. 30 tablet 1  . aspirin 81 MG EC tablet Take 1 tablet (81 mg total) by mouth daily. Swallow whole. 30 tablet 12  . omeprazole (PRILOSEC) 20 MG capsule TAKE 1 CAPSULE DAILY (NEEDS OFFICE VISIT ) 90 capsule 3  . rosuvastatin (CRESTOR) 5 MG tablet Take 0.5 tablets (2.5 mg total) by mouth daily. 30 tablet 6  . tadalafil (CIALIS) 20 MG tablet Take 0.5 tablets (10 mg total) by mouth daily as needed for erectile dysfunction. 8 tablet 5   Review Of Systems: 12 point ROS negative except as noted above in HPI.  Physical Exam: Filed Vitals:   10/15/14 1916  BP: 97/53  Pulse: 53  Temp:   Resp: 17    General: alert and cooperative HEENT: PERRLA and extra ocular movement intact Heart: S1, S2 normal, no murmur, rub or gallop, regular rate and rhythm Lungs: clear to auscultation, no wheezes or rales and unlabored breathing Abdomen: abdomen is soft without significant tenderness, masses, organomegaly or guarding Extremities: extremities normal, atraumatic, no cyanosis or edema Skin:no rashes Neurology: normal without focal findings  Labs and Imaging: Lab Results  Component Value Date/Time   NA 139 10/15/2014 01:51 PM   K 4.2 10/15/2014 01:51 PM   CL 108 10/15/2014 01:51 PM   CO2 24 10/15/2014 01:51 PM   BUN 14 10/15/2014 01:51 PM   CREATININE 0.78 10/15/2014 01:51 PM   GLUCOSE 94 10/15/2014 01:51 PM   GLUCOSE 96 06/10/2006 10:29 AM   Lab Results   Component Value Date   WBC 5.7 10/15/2014   HGB 14.5 10/15/2014   HCT 42.1 10/15/2014   MCV 84.4 10/15/2014   PLT 170 10/15/2014    No results found.         Shanda Howells MD  Pager: [phone]

## 2014-10-16 ENCOUNTER — Encounter (HOSPITAL_COMMUNITY): Payer: Self-pay | Admitting: *Deleted

## 2014-10-16 DIAGNOSIS — E785 Hyperlipidemia, unspecified: Secondary | ICD-10-CM | POA: Diagnosis present

## 2014-10-16 DIAGNOSIS — D696 Thrombocytopenia, unspecified: Secondary | ICD-10-CM | POA: Diagnosis present

## 2014-10-16 DIAGNOSIS — Z79899 Other long term (current) drug therapy: Secondary | ICD-10-CM | POA: Diagnosis not present

## 2014-10-16 DIAGNOSIS — I10 Essential (primary) hypertension: Secondary | ICD-10-CM | POA: Diagnosis present

## 2014-10-16 DIAGNOSIS — K5731 Diverticulosis of large intestine without perforation or abscess with bleeding: Principal | ICD-10-CM

## 2014-10-16 DIAGNOSIS — D62 Acute posthemorrhagic anemia: Secondary | ICD-10-CM | POA: Diagnosis not present

## 2014-10-16 DIAGNOSIS — K922 Gastrointestinal hemorrhage, unspecified: Secondary | ICD-10-CM | POA: Diagnosis not present

## 2014-10-16 DIAGNOSIS — K5791 Diverticulosis of intestine, part unspecified, without perforation or abscess with bleeding: Secondary | ICD-10-CM

## 2014-10-16 DIAGNOSIS — Z87891 Personal history of nicotine dependence: Secondary | ICD-10-CM | POA: Diagnosis not present

## 2014-10-16 DIAGNOSIS — F419 Anxiety disorder, unspecified: Secondary | ICD-10-CM | POA: Diagnosis present

## 2014-10-16 DIAGNOSIS — I2510 Atherosclerotic heart disease of native coronary artery without angina pectoris: Secondary | ICD-10-CM | POA: Diagnosis present

## 2014-10-16 DIAGNOSIS — Z7982 Long term (current) use of aspirin: Secondary | ICD-10-CM | POA: Diagnosis not present

## 2014-10-16 DIAGNOSIS — I252 Old myocardial infarction: Secondary | ICD-10-CM | POA: Diagnosis not present

## 2014-10-16 DIAGNOSIS — K2270 Barrett's esophagus without dysplasia: Secondary | ICD-10-CM | POA: Diagnosis present

## 2014-10-16 DIAGNOSIS — Z85828 Personal history of other malignant neoplasm of skin: Secondary | ICD-10-CM | POA: Diagnosis not present

## 2014-10-16 DIAGNOSIS — Z955 Presence of coronary angioplasty implant and graft: Secondary | ICD-10-CM | POA: Diagnosis not present

## 2014-10-16 LAB — CBC
HEMATOCRIT: 37.5 % — AB (ref 39.0–52.0)
HEMATOCRIT: 39.4 % (ref 39.0–52.0)
HEMATOCRIT: 36.4 % — AB (ref 39.0–52.0)
HEMOGLOBIN: 13.1 g/dL (ref 13.0–17.0)
HEMOGLOBIN: 12.4 g/dL — AB (ref 13.0–17.0)
Hemoglobin: 13.3 g/dL (ref 13.0–17.0)
MCH: 30.1 pg (ref 26.0–34.0)
MCH: 28.8 pg (ref 26.0–34.0)
MCH: 29.2 pg (ref 26.0–34.0)
MCHC: 34.9 g/dL (ref 30.0–36.0)
MCHC: 33.8 g/dL (ref 30.0–36.0)
MCHC: 34.1 g/dL (ref 30.0–36.0)
MCV: 86.2 fL (ref 78.0–100.0)
MCV: 85.3 fL (ref 78.0–100.0)
MCV: 85.8 fL (ref 78.0–100.0)
PLATELETS: 147 10*3/uL — AB (ref 150–400)
Platelets: 143 10*3/uL — ABNORMAL LOW (ref 150–400)
Platelets: 131 10*3/uL — ABNORMAL LOW (ref 150–400)
RBC: 4.35 MIL/uL (ref 4.22–5.81)
RBC: 4.62 MIL/uL (ref 4.22–5.81)
RBC: 4.24 MIL/uL (ref 4.22–5.81)
RDW: 13.2 % (ref 11.5–15.5)
RDW: 13 % (ref 11.5–15.5)
RDW: 13.1 % (ref 11.5–15.5)
WBC: 4 10*3/uL (ref 4.0–10.5)
WBC: 4.3 10*3/uL (ref 4.0–10.5)
WBC: 3.6 10*3/uL — ABNORMAL LOW (ref 4.0–10.5)

## 2014-10-16 LAB — COMPREHENSIVE METABOLIC PANEL
ALT: 18 U/L (ref 0–53)
ANION GAP: 4 — AB (ref 5–15)
AST: 21 U/L (ref 0–37)
Albumin: 3 g/dL — ABNORMAL LOW (ref 3.5–5.2)
Alkaline Phosphatase: 77 U/L (ref 39–117)
BUN: 15 mg/dL (ref 6–23)
CO2: 21 mmol/L (ref 19–32)
CREATININE: 1.05 mg/dL (ref 0.50–1.35)
Calcium: 8.6 mg/dL (ref 8.4–10.5)
Chloride: 111 mmol/L (ref 96–112)
GFR calc Af Amer: 80 mL/min — ABNORMAL LOW (ref >90)
GFR, EST NON AFRICAN AMERICAN: 69 mL/min — AB (ref >90)
Glucose, Bld: 87 mg/dL (ref 70–99)
Potassium: 3.9 mmol/L (ref 3.5–5.1)
SODIUM: 136 mmol/L (ref 135–145)
TOTAL PROTEIN: 5.5 g/dL — AB (ref 6.0–8.3)
Total Bilirubin: 1.3 mg/dL — ABNORMAL HIGH (ref 0.3–1.2)

## 2014-10-16 NOTE — Progress Notes (Signed)
UR completed 

## 2014-10-16 NOTE — Consult Note (Signed)
Referring Provider: Dr. Ernestina Patches Primary Care Physician:  [name], MD Primary Gastroenterologist:  Dr. Fuller Plan  Reason for Consultation:   GI bleed    HPI: Jose [name] is a 74 y.o. male  withHx Barrett's esophagus without dysplasia, esophageal stricture dilated in 2003, [place]. Hx of hematochezia with diverticulosis and mixed hemorrhoids on 2003 colonoscopy, unable to locate record of 2008 colonoscopy. Hx nephrolithiasis. Had cardiac stent placed to LAD in 2001.Had admission in May n2014 with diverticular bleed that stopped on its own.Had colonoscopy May 2014 as inpatient that revealed diverticular disease and blood  In colon. Has been feeling well, but yesterday morning had BM at 5:30 a.m. That was bloody, and subsequently had 7-8 bloody BMs later in the day. Has no abd pain. No N/V. NO fever or chills. No CP or SOB. Had 2 very small BMs this morning that had red blood, but pot says much less than yesterday.  Hgb in ER was 14.2, this morning Hgb is 13.1. Pt voices concerns about being in hospital--he is aware he needs observation but would like to go home as soon as he can as he needs to get to work for financial reasons.  Has hx Barretts, last EGD 07/06/2013, bx showed intestinal metaplasia, pt advised to have surveillance EGD in 3 years. No complaints of dysphagia at this time.  Past Medical History  Diagnosis Date  . ANXIETY 08/22/2008  . Barrett's esophagus 2003    hematochezia as a result since 2003  . CORONARY ARTERY DISEASE 2001    LAD stent per Dr Elonda Husky in [place]. 2 stents.   Marland Kitchen DIVERTICULOSIS, COLON 2003  . ERECTILE DYSFUNCTION 08/22/2008  . HYPERLIPIDEMIA 02/09/2007  . MYOCARDIAL INFARCTION, HX OF 2001  . NEPHROLITHIASIS, HX OF 02/09/2007    s/p lithotripsy multiple times  . OBESITY 03/20/2010  . Squamous cell cancer of skin of earlobe     removed from area behind right earlobe  . Basal cell cancer     removed from right forearm.   . Acute lower GI bleeding  01/25/2013    diverticular bleed    Past Surgical History  Procedure Laterality Date  . Lithotripsy    . Tonsilectomy, adenoidectomy, bilateral myringotomy and tubes    . Skin cancer excision      right forearm and posterior right ear.   . Colonoscopy N/A 01/26/2013    Procedure: COLONOSCOPY;  Surgeon: Milus Banister, MD;  Location: [facility];  Service: Endoscopy;  Laterality: N/A;    Prior to Admission medications   Medication Sig Start Date End Date Taking? Authorizing Provider  aspirin 81 MG EC tablet Take 1 tablet (81 mg total) by mouth daily. Swallow whole. 01/27/13  Yes Amy S Esterwood, PA-C  omeprazole (PRILOSEC) 20 MG capsule TAKE 1 CAPSULE DAILY (NEEDS OFFICE VISIT ) 08/29/14  Yes Marin Olp, MD  ALPRAZolam Duanne Moron) 0.25 MG tablet TAKE 1 TABLET BY MOUTH as needed once daily. Max 3 doses per week. 08/07/14   Marin Olp, MD  rosuvastatin (CRESTOR) 5 MG tablet Take 0.5 tablets (2.5 mg total) by mouth daily. 02/26/13   Lisabeth Pick, MD  tadalafil (CIALIS) 20 MG tablet Take 0.5 tablets (10 mg total) by mouth daily as needed for erectile dysfunction. 08/07/14   Marin Olp, MD    Current Facility-Administered Medications  Medication Dose Route Frequency Provider Last Rate Last Dose  . 0.9 %  sodium chloride infusion   Intravenous Continuous Shanda Howells, MD 100 mL/hr  at 10/16/14 8676    . ALPRAZolam Duanne Moron) tablet 0.25 mg  0.25 mg Oral QHS PRN Shanda Howells, MD      . pantoprazole (PROTONIX) EC tablet 40 mg  40 mg Oral Daily Shanda Howells, MD   40 mg at 10/15/14 2001  . rosuvastatin (CRESTOR) tablet 2.5 mg  2.5 mg Oral Daily Shanda Howells, MD   Stopped at 10/15/14 2328  . sodium chloride 0.9 % injection 3 mL  3 mL Intravenous Q12H Shanda Howells, MD   3 mL at 10/15/14 2329    Allergies as of 10/15/2014 - Review Complete 10/15/2014  Allergen Reaction Noted  . Atorvastatin  11/07/2009    Family History  Problem Relation Age of Onset  . Alcohol abuse Father   .  Liver disease Father   . Heart disease Brother     CABG  . Colon cancer Neg Hx     History   Social History  . Marital Status: Married    Spouse Name: N/A  . Number of Children: N/A  . Years of Education: N/A   Occupational History  . Not on file.   Social History Main Topics  . Smoking status: Former Smoker -- 1.00 packs/day for 20 years    Types: Cigarettes    Quit date: 08/31/1983  . Smokeless tobacco: Never Used  . Alcohol Use: Yes     Comment: drinks 1 beer a month  . Drug Use: No  . Sexual Activity: Not on file   Other Topics Concern  . Not on file   Social History Narrative   Still working at [employer]   Married 35 years in 2015, 4 kids from first marriage, 3 grandkids   Hobbies: woodworking, tv movies- old action movies like Rambo    Review of Systems: Gen: Denies any fever, chills, sweats, anorexia, fatigue, weakness, malaise, weight loss, and sleep disorder CV: Denies chest pain, angina, palpitations, syncope, orthopnea, PND, peripheral edema, and claudication. Resp: Denies dyspnea at rest, dyspnea with exercise, cough, sputum, wheezing, coughing up blood, and pleurisy. GI: Denies vomiting blood, jaundice, and fecal incontinence.   Denies dysphagia or odynophagia. GU : Denies urinary burning, blood in urine, urinary frequency, urinary hesitancy, nocturnal urination, and urinary incontinence. MS: Denies joint pain, limitation of movement, and swelling, stiffness, low back pain, extremity pain. Denies muscle weakness, cramps, atrophy.  Derm: Denies rash, itching, dry skin, hives, moles, warts, or unhealing ulcers.  Psych: Denies depression, anxiety, memory loss, suicidal ideation, hallucinations, paranoia, and confusion. Heme: Denies bruising, bleeding, and enlarged lymph nodes. Neuro:  Denies any headaches, dizziness, paresthesias. Endo:  Denies any problems with DM, thyroid, adrenal function.  Physical Exam: Vital signs in last 24 hours: Temp:   [97.2 F (36.2 C)-98 F (36.7 C)] 97.7 F (36.5 C) (02/17 0535) Pulse Rate:  [50-72] 71 (02/17 0535) Resp:  [11-26] 20 (02/17 0535) BP: (97-133)/(53-82) 103/67 mmHg (02/17 0535) SpO2:  [95 %-99 %] 96 % (02/17 0535) Weight:  [197 lb 4.8 oz (89.495 kg)-204 lb (92.534 kg)] 197 lb 4.8 oz (89.495 kg) (02/16 2049) Last BM Date: 10/15/14 General:   Alert,  Well-developed, well-nourished, pleasant and cooperative in NAD Head:  Normocephalic and atraumatic. Eyes:  Sclera clear, no icterus. Conjunctiva pink. Ears:  Normal auditory acuity. Nose:  No deformity, dischargeor lesions. Mouth:  No deformity or lesions.   Neck:  Supple; no masses or thyromegaly. Lungs:  Clear throughout to auscultation.     Heart:  Regular rate and rhythm; no  murmurs, clicks, rubs,  or gallops. Abdomen:  Soft,nontender, BS active,nonpalp mass or hsm.   Rectal:  Pt just came out of bathroom, has small amount red blood near rectum Msk:  Symmetrical without gross deformities. . Pulses:  Normal pulses noted. Extremities:  Without clubbing or edema. Neurologic:  Alert and  oriented x4;  grossly normal neurologically. Skin:  Intact without significant lesions or rashes.. Psych:  Alert and cooperative. Normal mood and affect.  Intake/Output from previous day: 02/16 0701 - 02/17 0700 In: 1023.3 [I.V.:1023.3] Out: 300 [Urine:300] Intake/Output this shift:    Lab Results:  Recent Labs  10/15/14 1351 10/15/14 1942 10/16/14 0331  WBC 5.7 5.9 4.0  HGB 14.5 13.7 13.1  HCT 42.1 40.2 37.5*  PLT 170 143* 143*   BMET  Recent Labs  10/15/14 1351 10/16/14 0331  NA 139 136  K 4.2 3.9  CL 108 111  CO2 24 21  GLUCOSE 94 87  BUN 14 15  CREATININE 0.78 1.05  CALCIUM 9.2 8.6   LFT  Recent Labs  10/16/14 0331  PROT 5.5*  ALBUMIN 3.0*  AST 21  ALT 18  ALKPHOS 77  BILITOT 1.3*    PROCEDURES:Colonoscopy 01/26/13 with multiple diverticulae through colon with red blood throughout  colon.   IMPRESSION/PLAN: Painless hematochezia, suspect diverticular bleed. Continue NPO, if bleeding slows further will start clears this afternoon.  CBC q 8 hours, PT/INR. Hold ASA.  Hvozdovic, Deloris Ping 10/16/2014,  Pager [phone]  [facility] Attending  I have also seen and assessed the patient and agree with the above note. We suspect recurrent diverticular bleeding which seems to be slowing/stopping. If goes 48 hrs w/o suspected sig bleeding would dc. Will follow.  Gatha Mayer, MD, [facility] [phone] (pager) 10/16/2014 5:31 PM

## 2014-10-16 NOTE — Progress Notes (Signed)
[facility] Progress Note   ESEQUIEL KLEINFELTER UMP:[id number] DOB: 1941-05-09 DOA: 10/15/2014 PCP: [name], MD  Brief narrative: Jose [name] is a 74 y.o. male admitted for blood stools starting on the morning of admission. He has a h/o diverticulosis and has an episode like this in the past.    Subjective: Just had a BM which was still bloody but feel the bleeding is decreasing - no c/o feeling lightheaded- no abdominal pain  Assessment/Plan: Active Problems:   GIB (gastrointestinal bleeding) - suspecting this is diverticular- GI is following- cont IVF and cont to follow H and H- ASA has been held - he last took it yesterday  Mild thrombocytopenia- - cont to follow   Mildly elevated T bili - other LFTs norma- repeat tomorrow  HLD -cont statin  CAD  -no active CP  -tele bed  Code Status: full code Family Communication:  Disposition Plan: home when bleeding resolved DVT prophylaxis: SCDs  Consultants: GI  Procedures:   Antibiotics: Anti-infectives    None         Objective: Filed Weights   10/15/14 1344 10/15/14 2049  Weight: 92.534 kg (204 lb) 89.495 kg (197 lb 4.8 oz)    Intake/Output Summary (Last 24 hours) at 10/16/14 1529 Last data filed at 10/16/14 4008  Gross per 24 hour  Intake 1023.33 ml  Output    300 ml  Net 723.33 ml     Vitals Filed Vitals:   10/15/14 1945 10/15/14 2000 10/15/14 2049 10/16/14 0535  BP: 119/71 125/69 115/75 103/67  Pulse: 53 58 69 71  Temp:   98 F (36.7 C) 97.7 F (36.5 C)  TempSrc:   Oral Oral  Resp: 17 15 18 20   Height:   5\' 9"  (1.753 m)   Weight:   89.495 kg (197 lb 4.8 oz)   SpO2: 97% 95% 99% 96%    Exam: General: AAO x3, No acute respiratory distress Lungs: Clear to auscultation bilaterally without wheezes or crackles Cardiovascular: Regular rate and rhythm without murmur gallop or rub normal S1 and S2 Abdomen: Nontender, nondistended, soft, bowel sounds positive, no rebound, no  ascites, no appreciable mass Extremities: No significant cyanosis, clubbing, or edema bilateral lower extremities  Data Reviewed: Basic Metabolic Panel:  Recent Labs Lab 10/15/14 1351 10/16/14 0331  NA 139 136  K 4.2 3.9  CL 108 111  CO2 24 21  GLUCOSE 94 87  BUN 14 15  CREATININE 0.78 1.05  CALCIUM 9.2 8.6   Liver Function Tests:  Recent Labs Lab 10/16/14 0331  AST 21  ALT 18  ALKPHOS 77  BILITOT 1.3*  PROT 5.5*  ALBUMIN 3.0*   No results for input(s): LIPASE, AMYLASE in the last 168 hours. No results for input(s): AMMONIA in the last 168 hours. CBC:  Recent Labs Lab 10/15/14 1351 10/15/14 1942 10/16/14 0331 10/16/14 1156  WBC 5.7 5.9 4.0 4.3  NEUTROABS 3.7  --   --   --   HGB 14.5 13.7 13.1 13.3  HCT 42.1 40.2 37.5* 39.4  MCV 84.4 85.2 86.2 85.3  PLT 170 143* 143* 147*   Cardiac Enzymes: No results for input(s): CKTOTAL, CKMB, CKMBINDEX, TROPONINI in the last 168 hours. BNP (last 3 results) No results for input(s): BNP in the last 8760 hours.  ProBNP (last 3 results) No results for input(s): PROBNP in the last 8760 hours.  CBG: No results for input(s): GLUCAP in the last 168 hours.  No results found for this or any  previous visit (from the past 240 hour(s)).   Studies:  Recent x-ray studies have been reviewed in detail by the Attending Physician  Scheduled Meds:  Scheduled Meds: . pantoprazole  40 mg Oral Daily  . rosuvastatin  2.5 mg Oral Daily  . sodium chloride  3 mL Intravenous Q12H   Continuous Infusions: . sodium chloride 100 mL/hr at 10/16/14 3612    Time spent on care of this patient: 45 min   Belleair Shore, MD 10/16/2014, 3:29 PM    [facility] Office  [phone] Pager - Text Page per [email or web address]  If 7PM-7AM, please contact night-coverage [email or web address]

## 2014-10-17 DIAGNOSIS — D62 Acute posthemorrhagic anemia: Secondary | ICD-10-CM | POA: Diagnosis present

## 2014-10-17 DIAGNOSIS — K5731 Diverticulosis of large intestine without perforation or abscess with bleeding: Secondary | ICD-10-CM | POA: Diagnosis present

## 2014-10-17 LAB — COMPREHENSIVE METABOLIC PANEL
ALT: 16 U/L (ref 0–53)
AST: 17 U/L (ref 0–37)
Albumin: 2.8 g/dL — ABNORMAL LOW (ref 3.5–5.2)
Alkaline Phosphatase: 65 U/L (ref 39–117)
Anion gap: 4 — ABNORMAL LOW (ref 5–15)
BUN: 16 mg/dL (ref 6–23)
CALCIUM: 8.3 mg/dL — AB (ref 8.4–10.5)
CHLORIDE: 112 mmol/L (ref 96–112)
CO2: 22 mmol/L (ref 19–32)
Creatinine, Ser: 1.05 mg/dL (ref 0.50–1.35)
GFR, EST AFRICAN AMERICAN: 80 mL/min — AB (ref >90)
GFR, EST NON AFRICAN AMERICAN: 69 mL/min — AB (ref >90)
GLUCOSE: 119 mg/dL — AB (ref 70–99)
Potassium: 3.7 mmol/L (ref 3.5–5.1)
Sodium: 138 mmol/L (ref 135–145)
Total Bilirubin: 0.8 mg/dL (ref 0.3–1.2)
Total Protein: 5.1 g/dL — ABNORMAL LOW (ref 6.0–8.3)

## 2014-10-17 LAB — CBC
HCT: 32.6 % — ABNORMAL LOW (ref 39.0–52.0)
Hemoglobin: 11.2 g/dL — ABNORMAL LOW (ref 13.0–17.0)
MCH: 29.3 pg (ref 26.0–34.0)
MCHC: 34.4 g/dL (ref 30.0–36.0)
MCV: 85.3 fL (ref 78.0–100.0)
PLATELETS: 132 10*3/uL — AB (ref 150–400)
RBC: 3.82 MIL/uL — ABNORMAL LOW (ref 4.22–5.81)
RDW: 13.1 % (ref 11.5–15.5)
WBC: 3.4 10*3/uL — AB (ref 4.0–10.5)

## 2014-10-17 LAB — HEMOGLOBIN AND HEMATOCRIT, BLOOD
HCT: 35.4 % — ABNORMAL LOW (ref 39.0–52.0)
Hemoglobin: 11.9 g/dL — ABNORMAL LOW (ref 13.0–17.0)

## 2014-10-17 NOTE — Progress Notes (Signed)
[facility] Progress Note   Jose [name] HCW:[id number] DOB: 1940/11/19 DOA: 10/15/2014 PCP: [name], MD  Brief narrative: Jose [name] is a 74 y.o. male admitted for blood stools starting on the morning of admission. He has a h/o diverticulosis and has an episode like this in the past. He is being treated for diverticular bleed.    Subjective: Reports had a few bm this am with blood in it.   Assessment/Plan: Active Problems:   GIB (gastrointestinal bleeding) Diverticular bleeding. Still having bm with some blood,fortunately, the H&h is stable. Gi on board , recommend wathcing him one more day. Plan to check cbc in am.   Mild thrombocytopenia- - cont to follow . Stable.   Mildly elevated T bili - other LFTs norma- repeat level normal.   HLD -cont statin  CAD  -no active CP    Code Status: full code Family Communication: none at bedide.  Disposition Plan: home when bleeding resolved DVT prophylaxis: SCDs  Consultants: GI  Procedures: none  Antibiotics: Anti-infectives    None         Objective: Filed Weights   10/15/14 1344 10/15/14 2049  Weight: 92.534 kg (204 lb) 89.495 kg (197 lb 4.8 oz)    Intake/Output Summary (Last 24 hours) at 10/17/14 1606 Last data filed at 10/17/14 1532  Gross per 24 hour  Intake   1080 ml  Output    100 ml  Net    980 ml     Vitals Filed Vitals:   10/16/14 2139 10/16/14 2224 10/17/14 0541 10/17/14 1501  BP: 98/60 104/62 117/70 133/66  Pulse:   69   Temp:   97.9 F (36.6 C) 97.7 F (36.5 C)  TempSrc:   Oral Oral  Resp:   18 19  Height:      Weight:      SpO2:   96% 99%    Exam: General: AAO x3, Not in any distress.  Lungs: Clear to auscultation , no wheezing or rhonchi.  Cardiovascular: Regular rate and rhythm without murmur gallop or rub normal S1 and S2 Abdomen: Nontender, nondistended, soft, bowel sounds positive, Extremities: trace pedal edema.   Data Reviewed: Basic  Metabolic Panel:  Recent Labs Lab 10/15/14 1351 10/16/14 0331 10/17/14 0411  NA 139 136 138  K 4.2 3.9 3.7  CL 108 111 112  CO2 24 21 22   GLUCOSE 94 87 119*  BUN 14 15 16   CREATININE 0.78 1.05 1.05  CALCIUM 9.2 8.6 8.3*   Liver Function Tests:  Recent Labs Lab 10/16/14 0331 10/17/14 0411  AST 21 17  ALT 18 16  ALKPHOS 77 65  BILITOT 1.3* 0.8  PROT 5.5* 5.1*  ALBUMIN 3.0* 2.8*   No results for input(s): LIPASE, AMYLASE in the last 168 hours. No results for input(s): AMMONIA in the last 168 hours. CBC:  Recent Labs Lab 10/15/14 1351 10/15/14 1942 10/16/14 0331 10/16/14 1156 10/16/14 1900 10/17/14 0411 10/17/14 1505  WBC 5.7 5.9 4.0 4.3 3.6* 3.4*  --   NEUTROABS 3.7  --   --   --   --   --   --   HGB 14.5 13.7 13.1 13.3 12.4* 11.2* 11.9*  HCT 42.1 40.2 37.5* 39.4 36.4* 32.6* 35.4*  MCV 84.4 85.2 86.2 85.3 85.8 85.3  --   PLT 170 143* 143* 147* 131* 132*  --    Cardiac Enzymes: No results for input(s): CKTOTAL, CKMB, CKMBINDEX, TROPONINI in the last 168 hours. BNP (last 3  results) No results for input(s): BNP in the last 8760 hours.  ProBNP (last 3 results) No results for input(s): PROBNP in the last 8760 hours.  CBG: No results for input(s): GLUCAP in the last 168 hours.  No results found for this or any previous visit (from the past 240 hour(s)).   Studies:  Recent x-ray studies have been reviewed in detail by the Attending Physician  Scheduled Meds:  Scheduled Meds: . pantoprazole  40 mg Oral Daily  . rosuvastatin  2.5 mg Oral Daily  . sodium chloride  3 mL Intravenous Q12H   Continuous Infusions: . sodium chloride 100 mL/hr at 10/17/14 0346    Time spent on care of this patient: 35 min   Sitlali Koerner, MD 10/17/2014, 4:06 PM  LOS: 1 day   [facility] Office  [phone] Pager - Text Page per [email or web address]  If 7PM-7AM, please contact night-coverage [email or web address]

## 2014-10-17 NOTE — Progress Notes (Signed)
     [facility] Progress Note  Subjective:  Just had a small BM that is dark in color with red blood.  Says that he's had 3 other similar stools since we were here to see him last evening as well.  Feels good.  Objective:  Vital signs in last 24 hours: Temp:  [97.9 F (36.6 C)-98.7 F (37.1 C)] 97.9 F (36.6 C) (02/18 0541) Pulse Rate:  [54-71] 69 (02/18 0541) Resp:  [18] 18 (02/18 0541) BP: (96-118)/(47-83) 117/70 mmHg (02/18 0541) SpO2:  [94 %-98 %] 96 % (02/18 0541) Last BM Date: 10/15/14 (Pt stated) General:  Alert, Well-developed, in NAD Heart:  Regular rate and rhythm; no murmurs Pulm:  CTAB.  No W/R/R. Abdomen:  Soft, non-distended. Normal bowel sounds.  Non-tender.  Extremities:  Without edema. Neurologic:  Alert and  oriented x4;  grossly normal neurologically. Psych:  Alert and cooperative. Normal mood and affect.   Lab Results:  Recent Labs  10/16/14 1156 10/16/14 1900 10/17/14 0411  WBC 4.3 3.6* 3.4*  HGB 13.3 12.4* 11.2*  HCT 39.4 36.4* 32.6*  PLT 147* 131* 132*    Assessment / Plan: *Painless hematochezia, suspect diverticular bleed:  Still with some bloody stool this AM. *Acute blood loss anemia:  Secondary to above, also probably somewhat dilutional drop:  Hgb down 3 grams since admission.  -Would observe for one more day.  Continue soft/low residue diet.  Monitor Hgb.    LOS: 1 day   ZEHR, JESSICA D.  10/17/2014, 9:10 AM  Pager number [phone]   [facility] Attending  I have also seen and assessed the patient and agree with the above note. Gatha Mayer, MD, [facility] [phone] (pager) 10/17/2014 1:00 PM

## 2014-10-18 LAB — CBC
HCT: 35 % — ABNORMAL LOW (ref 39.0–52.0)
Hemoglobin: 11.9 g/dL — ABNORMAL LOW (ref 13.0–17.0)
MCH: 29 pg (ref 26.0–34.0)
MCHC: 34 g/dL (ref 30.0–36.0)
MCV: 85.2 fL (ref 78.0–100.0)
Platelets: 149 10*3/uL — ABNORMAL LOW (ref 150–400)
RBC: 4.11 MIL/uL — ABNORMAL LOW (ref 4.22–5.81)
RDW: 13.1 % (ref 11.5–15.5)
WBC: 3.7 10*3/uL — ABNORMAL LOW (ref 4.0–10.5)

## 2014-10-18 LAB — COMPREHENSIVE METABOLIC PANEL
ALT: 18 U/L (ref 0–53)
AST: 20 U/L (ref 0–37)
Albumin: 3.2 g/dL — ABNORMAL LOW (ref 3.5–5.2)
Alkaline Phosphatase: 71 U/L (ref 39–117)
Anion gap: 5 (ref 5–15)
BUN: 8 mg/dL (ref 6–23)
CO2: 22 mmol/L (ref 19–32)
CREATININE: 0.94 mg/dL (ref 0.50–1.35)
Calcium: 8.6 mg/dL (ref 8.4–10.5)
Chloride: 112 mmol/L (ref 96–112)
GFR, EST NON AFRICAN AMERICAN: 82 mL/min — AB (ref >90)
GLUCOSE: 110 mg/dL — AB (ref 70–99)
Potassium: 3.9 mmol/L (ref 3.5–5.1)
SODIUM: 139 mmol/L (ref 135–145)
TOTAL PROTEIN: 5.3 g/dL — AB (ref 6.0–8.3)
Total Bilirubin: 0.7 mg/dL (ref 0.3–1.2)

## 2014-10-18 MED ORDER — ASPIRIN 81 MG PO TBEC: 81.0000 mg | DELAYED_RELEASE_TABLET | Freq: Every day | ORAL | Status: DC

## 2014-10-18 NOTE — Progress Notes (Signed)
0600 Paged on call MD, Pt had 8 runs of Ventricular Bigeminy, pt asymptomatic. Awaiting call back, will notify Day RN. Will continue to monitor

## 2014-10-18 NOTE — Progress Notes (Signed)
     [facility] Progress Note  Subjective:  Feels good.  Had a BM last night that was mostly brown, only small amount of blood seen upon wiping.  Small BM this AM with really no sign of blood.  Objective:  Vital signs in last 24 hours: Temp:  [97.6 F (36.4 C)-97.7 F (36.5 C)] 97.7 F (36.5 C) (02/19 0517) Pulse Rate:  [58-66] 58 (02/19 0517) Resp:  [15-19] 15 (02/19 0517) BP: (101-133)/(57-70) 131/70 mmHg (02/19 0517) SpO2:  [96 %-100 %] 96 % (02/19 0517) Last BM Date: 10/17/14 General:  Alert, Well-developed, in NAD Heart:  Regular rate and rhythm; no murmurs Pulm:  CTAB.  No W/R/R. Abdomen:  Soft, non-distended. Normal bowel sounds.  Non-tender.  Extremities:  Without edema. Neurologic:  Alert and  oriented x4;  grossly normal neurologically. Psych:  Alert and cooperative. Normal mood and affect.  Intake/Output from previous day: 02/18 0701 - 02/19 0700 In: 720 [P.O.:720] Out: 1200 [Urine:1200] Intake/Output this shift: Total I/O In: 240 [P.O.:240] Out: -   Lab Results:  Recent Labs  10/16/14 1156 10/16/14 1900 10/17/14 0411 10/17/14 1505  WBC 4.3 3.6* 3.4*  --   HGB 13.3 12.4* 11.2* 11.9*  HCT 39.4 36.4* 32.6* 35.4*  PLT 147* 131* 132*  --    BMET  Recent Labs  10/15/14 1351 10/16/14 0331 10/17/14 0411  NA 139 136 138  K 4.2 3.9 3.7  CL 108 111 112  CO2 24 21 22   GLUCOSE 94 87 119*  BUN 14 15 16   CREATININE 0.78 1.05 1.05  CALCIUM 9.2 8.6 8.3*   LFT  Recent Labs  10/17/14 0411  PROT 5.1*  ALBUMIN 2.8*  AST 17  ALT 16  ALKPHOS 65  BILITOT 0.8   Assessment / Plan: *Painless hematochezia, suspect diverticular bleed: Bleeding continues to decrease with only very small amount last night and minimal to none this morning. *Acute blood loss anemia: Hgb stable/improved this morning.  -Ok for discharge from GI standpoint.  Should have follow-up with PCP to repeat Hgb next week.   LOS: 2 days   ZEHR, JESSICA D.  10/18/2014,  10:01 AM  Pager number [phone]

## 2014-10-18 NOTE — Discharge Summary (Signed)
Physician Discharge Summary  Jose [name] FTD:[id number] DOB: 1941/07/28 DOA: 10/15/2014  PCP: [name], MD  Admit date: 10/15/2014 Discharge date: 10/18/2014  Time spent: 25 minutes  Recommendations for Outpatient Follow-up:  1. Follow up with gastroenterology as recommended 2. Please check your hemoglobin in one week 3. Please hold aspirin for one week   Discharge Diagnoses:  Active Problems:   GIB (gastrointestinal bleeding)   Diverticulosis of colon with hemorrhage   Acute blood loss anemia   Discharge Condition: improved  Diet recommendation: regular diet.   Filed Weights   10/15/14 1344 10/15/14 2049  Weight: 92.534 kg (204 lb) 89.495 kg (197 lb 4.8 oz)    History of present illness:  Jose [name] is a 74 y.o. male admitted for blood stools starting on the morning of admission. He has a h/o diverticulosis and has an episode like this in the past. He is being treated for diverticular bleed.   Hospital Course:  GIB (gastrointestinal bleeding) Diverticular bleeding. Still having bm with some blood,fortunately, the H&h is stable. Gastroenterology on board, recommended to observe . Repeat CBC has been stable around 11. He was able to tolerate soft diet with out any bleeding. He will be discharged home and recommende dto hold aspirin for one week.    Mild thrombocytopenia-  Stable.   Mildly elevated T bili - other LFTs norma- repeat level normal.   HLD -cont statin  CAD  -no active CP   Procedures:  none  Consultations:  gastroenterology  Discharge Exam: Filed Vitals:   10/18/14 0517  BP: 131/70  Pulse: 58  Temp: 97.7 F (36.5 C)  Resp: 15    General: alert afebrile comfortable Cardiovascular: s1s2 Respiratory: ctab  Discharge Instructions   Discharge Instructions    Discharge instructions    Complete by:  As directed   Follow  Up with PCP in one week to check cbc.          Current Discharge Medication List    CONTINUE  these medications which have CHANGED   Details  aspirin 81 MG EC tablet Take 1 tablet (81 mg total) by mouth daily. Swallow whole. Qty: 30 tablet, Refills: 12      CONTINUE these medications which have NOT CHANGED   Details  ALPRAZolam (XANAX) 0.25 MG tablet TAKE 1 TABLET BY MOUTH as needed once daily. Max 3 doses per week. Qty: 30 tablet, Refills: 1    omeprazole (PRILOSEC) 20 MG capsule Take 20 mg by mouth daily.    rosuvastatin (CRESTOR) 5 MG tablet Take 0.5 tablets (2.5 mg total) by mouth daily. Qty: 30 tablet, Refills: 6    tadalafil (CIALIS) 20 MG tablet Take 0.5 tablets (10 mg total) by mouth daily as needed for erectile dysfunction. Qty: 8 tablet, Refills: 5       Allergies  Allergen Reactions  . Atorvastatin     REACTION: muscle weakness   Follow-up Information    Follow up with [name], MD. Schedule an appointment as soon as possible for a visit in 1 week.   Specialty:  Family Medicine   Why:  check CBC    Contact information:   [address] [place] [id number] [phone]        The results of significant diagnostics from this hospitalization (including imaging, microbiology, ancillary and laboratory) are listed below for reference.    Significant Diagnostic Studies: No results found.  Microbiology: No results found for this or any previous visit (from the past  240 hour(s)).   Labs: Basic Metabolic Panel:  Recent Labs Lab 10/15/14 1351 10/16/14 0331 10/17/14 0411 10/18/14 0809  NA 139 136 138 139  K 4.2 3.9 3.7 3.9  CL 108 111 112 112  CO2 24 21 22 22   GLUCOSE 94 87 119* 110*  BUN 14 15 16 8   CREATININE 0.78 1.05 1.05 0.94  CALCIUM 9.2 8.6 8.3* 8.6   Liver Function Tests:  Recent Labs Lab 10/16/14 0331 10/17/14 0411 10/18/14 0809  AST 21 17 20   ALT 18 16 18   ALKPHOS 77 65 71  BILITOT 1.3* 0.8 0.7  PROT 5.5* 5.1* 5.3*  ALBUMIN 3.0* 2.8* 3.2*   No results for input(s): LIPASE, AMYLASE in the last 168  hours. No results for input(s): AMMONIA in the last 168 hours. CBC:  Recent Labs Lab 10/15/14 1351  10/16/14 0331 10/16/14 1156 10/16/14 1900 10/17/14 0411 10/17/14 1505 10/18/14 0809  WBC 5.7  < > 4.0 4.3 3.6* 3.4*  --  3.7*  NEUTROABS 3.7  --   --   --   --   --   --   --   HGB 14.5  < > 13.1 13.3 12.4* 11.2* 11.9* 11.9*  HCT 42.1  < > 37.5* 39.4 36.4* 32.6* 35.4* 35.0*  MCV 84.4  < > 86.2 85.3 85.8 85.3  --  85.2  PLT 170  < > 143* 147* 131* 132*  --  149*  < > = values in this interval not displayed. Cardiac Enzymes: No results for input(s): CKTOTAL, CKMB, CKMBINDEX, TROPONINI in the last 168 hours. BNP: BNP (last 3 results) No results for input(s): BNP in the last 8760 hours.  ProBNP (last 3 results) No results for input(s): PROBNP in the last 8760 hours.  CBG: No results for input(s): GLUCAP in the last 168 hours.     SignedHosie Poisson  [facility] 10/18/2014, 11:16 AM

## 2014-10-18 NOTE — Care Management Note (Unsigned)
    Page 1 of 1   10/18/2014     12:49:03 PM CARE MANAGEMENT NOTE 10/18/2014  Patient:  Jose [name], Jose [name]   Account Number:  [id number]  Date Initiated:  10/18/2014  Documentation initiated by:  Tomi Bamberger  Subjective/Objective Assessment:   dx gib  admit     Action/Plan:   Anticipated DC Date:  10/18/2014   Anticipated DC Plan:  [place]  CM consult      Choice offered to / List presented to:             Status of service:  Completed, signed off Medicare Important Message given?  YES (If response is "NO", the following Medicare IM given date fields will be blank) Date Medicare IM given:  10/18/2014 Medicare IM given by:  Tomi Bamberger Date Additional Medicare IM given:   Additional Medicare IM given by:    Discharge Disposition:  HOME/SELF CARE  Per UR Regulation:  Reviewed for med. necessity/level of care/duration of stay  If discussed at [place] of Stay Meetings, dates discussed:    Comments:  2/191/6 [place] BSN [phone] patient dc, no needs.

## 2014-12-09 ENCOUNTER — Other Ambulatory Visit: Payer: Self-pay | Admitting: Family Medicine

## 2015-03-19 DIAGNOSIS — E782 Mixed hyperlipidemia: Secondary | ICD-10-CM | POA: Diagnosis not present

## 2015-03-19 DIAGNOSIS — I10 Essential (primary) hypertension: Secondary | ICD-10-CM | POA: Diagnosis not present

## 2015-03-19 DIAGNOSIS — R001 Bradycardia, unspecified: Secondary | ICD-10-CM | POA: Diagnosis not present

## 2015-03-19 DIAGNOSIS — I25119 Atherosclerotic heart disease of native coronary artery with unspecified angina pectoris: Secondary | ICD-10-CM | POA: Diagnosis not present

## 2015-03-19 DIAGNOSIS — R072 Precordial pain: Secondary | ICD-10-CM | POA: Diagnosis not present

## 2015-03-19 DIAGNOSIS — K5731 Diverticulosis of large intestine without perforation or abscess with bleeding: Secondary | ICD-10-CM | POA: Insufficient documentation

## 2015-04-01 DIAGNOSIS — I25119 Atherosclerotic heart disease of native coronary artery with unspecified angina pectoris: Secondary | ICD-10-CM | POA: Diagnosis not present

## 2015-04-01 DIAGNOSIS — R072 Precordial pain: Secondary | ICD-10-CM | POA: Diagnosis not present

## 2015-04-14 ENCOUNTER — Emergency Department (HOSPITAL_COMMUNITY): Payer: Medicare Other

## 2015-04-14 ENCOUNTER — Inpatient Hospital Stay (HOSPITAL_COMMUNITY)
Admission: EM | Admit: 2015-04-14 | Discharge: 2015-04-16 | DRG: 552 | Disposition: A | Discharge status: Home Health | Payer: Medicare Other | Attending: Internal Medicine | Admitting: Internal Medicine

## 2015-04-14 ENCOUNTER — Encounter (HOSPITAL_COMMUNITY): Payer: Self-pay | Admitting: Family Medicine

## 2015-04-14 ENCOUNTER — Inpatient Hospital Stay (HOSPITAL_COMMUNITY): Payer: Medicare Other

## 2015-04-14 DIAGNOSIS — Z87891 Personal history of nicotine dependence: Secondary | ICD-10-CM | POA: Diagnosis not present

## 2015-04-14 DIAGNOSIS — F411 Generalized anxiety disorder: Secondary | ICD-10-CM | POA: Diagnosis not present

## 2015-04-14 DIAGNOSIS — I252 Old myocardial infarction: Secondary | ICD-10-CM | POA: Diagnosis not present

## 2015-04-14 DIAGNOSIS — K5730 Diverticulosis of large intestine without perforation or abscess without bleeding: Secondary | ICD-10-CM | POA: Diagnosis present

## 2015-04-14 DIAGNOSIS — I639 Cerebral infarction, unspecified: Secondary | ICD-10-CM | POA: Diagnosis present

## 2015-04-14 DIAGNOSIS — K219 Gastro-esophageal reflux disease without esophagitis: Secondary | ICD-10-CM | POA: Diagnosis present

## 2015-04-14 DIAGNOSIS — Z7982 Long term (current) use of aspirin: Secondary | ICD-10-CM

## 2015-04-14 DIAGNOSIS — M6289 Other specified disorders of muscle: Secondary | ICD-10-CM | POA: Diagnosis present

## 2015-04-14 DIAGNOSIS — R29898 Other symptoms and signs involving the musculoskeletal system: Secondary | ICD-10-CM | POA: Diagnosis not present

## 2015-04-14 DIAGNOSIS — I6501 Occlusion and stenosis of right vertebral artery: Secondary | ICD-10-CM | POA: Diagnosis present

## 2015-04-14 DIAGNOSIS — R202 Paresthesia of skin: Secondary | ICD-10-CM | POA: Diagnosis not present

## 2015-04-14 DIAGNOSIS — R531 Weakness: Secondary | ICD-10-CM | POA: Diagnosis not present

## 2015-04-14 DIAGNOSIS — Z955 Presence of coronary angioplasty implant and graft: Secondary | ICD-10-CM

## 2015-04-14 DIAGNOSIS — Z79899 Other long term (current) drug therapy: Secondary | ICD-10-CM | POA: Diagnosis not present

## 2015-04-14 DIAGNOSIS — M5124 Other intervertebral disc displacement, thoracic region: Secondary | ICD-10-CM | POA: Diagnosis not present

## 2015-04-14 DIAGNOSIS — I2510 Atherosclerotic heart disease of native coronary artery without angina pectoris: Secondary | ICD-10-CM | POA: Diagnosis present

## 2015-04-14 DIAGNOSIS — R208 Other disturbances of skin sensation: Secondary | ICD-10-CM | POA: Diagnosis not present

## 2015-04-14 DIAGNOSIS — Z85828 Personal history of other malignant neoplasm of skin: Secondary | ICD-10-CM | POA: Diagnosis not present

## 2015-04-14 DIAGNOSIS — Z888 Allergy status to other drugs, medicaments and biological substances status: Secondary | ICD-10-CM

## 2015-04-14 DIAGNOSIS — E785 Hyperlipidemia, unspecified: Secondary | ICD-10-CM | POA: Insufficient documentation

## 2015-04-14 DIAGNOSIS — M4802 Spinal stenosis, cervical region: Secondary | ICD-10-CM | POA: Diagnosis not present

## 2015-04-14 DIAGNOSIS — R200 Anesthesia of skin: Secondary | ICD-10-CM | POA: Insufficient documentation

## 2015-04-14 DIAGNOSIS — Z8249 Family history of ischemic heart disease and other diseases of the circulatory system: Secondary | ICD-10-CM

## 2015-04-14 DIAGNOSIS — R011 Cardiac murmur, unspecified: Secondary | ICD-10-CM

## 2015-04-14 DIAGNOSIS — I6789 Other cerebrovascular disease: Secondary | ICD-10-CM | POA: Diagnosis not present

## 2015-04-14 DIAGNOSIS — R001 Bradycardia, unspecified: Secondary | ICD-10-CM | POA: Diagnosis present

## 2015-04-14 HISTORY — DX: Cardiac murmur, unspecified: R01.1

## 2015-04-14 HISTORY — DX: Bradycardia, unspecified: R00.1

## 2015-04-14 HISTORY — DX: Gastro-esophageal reflux disease without esophagitis: K21.9

## 2015-04-14 HISTORY — DX: Unspecified osteoarthritis, unspecified site: M19.90

## 2015-04-14 HISTORY — DX: Acute myocardial infarction, unspecified: I21.9

## 2015-04-14 HISTORY — DX: Calculus of kidney: N20.0

## 2015-04-14 LAB — I-STAT CHEM 8, ED
BUN: 10 mg/dL (ref 6–20)
CHLORIDE: 106 mmol/L (ref 101–111)
Calcium, Ion: 1.13 mmol/L (ref 1.13–1.30)
Creatinine, Ser: 1 mg/dL (ref 0.61–1.24)
GLUCOSE: 96 mg/dL (ref 65–99)
HEMATOCRIT: 43 % (ref 39.0–52.0)
HEMOGLOBIN: 14.6 g/dL (ref 13.0–17.0)
POTASSIUM: 4.4 mmol/L (ref 3.5–5.1)
Sodium: 138 mmol/L (ref 135–145)
TCO2: 20 mmol/L (ref 0–100)

## 2015-04-14 LAB — URINALYSIS, ROUTINE W REFLEX MICROSCOPIC
Bilirubin Urine: NEGATIVE
Glucose, UA: NEGATIVE mg/dL
Hgb urine dipstick: NEGATIVE
KETONES UR: NEGATIVE mg/dL
LEUKOCYTES UA: NEGATIVE
NITRITE: NEGATIVE
PH: 7 (ref 5.0–8.0)
PROTEIN: NEGATIVE mg/dL
Specific Gravity, Urine: 1.012 (ref 1.005–1.030)
UROBILINOGEN UA: 1 mg/dL (ref 0.0–1.0)

## 2015-04-14 LAB — CBC WITH DIFFERENTIAL/PLATELET
BASOS ABS: 0 10*3/uL (ref 0.0–0.1)
BASOS PCT: 1 % (ref 0–1)
EOS ABS: 0.1 10*3/uL (ref 0.0–0.7)
Eosinophils Relative: 2 % (ref 0–5)
HEMATOCRIT: 39.6 % (ref 39.0–52.0)
HEMOGLOBIN: 13.2 g/dL (ref 13.0–17.0)
Lymphocytes Relative: 30 % (ref 12–46)
Lymphs Abs: 1.1 10*3/uL (ref 0.7–4.0)
MCH: 26.4 pg (ref 26.0–34.0)
MCHC: 33.3 g/dL (ref 30.0–36.0)
MCV: 79.2 fL (ref 78.0–100.0)
MONOS PCT: 13 % — AB (ref 3–12)
Monocytes Absolute: 0.5 10*3/uL (ref 0.1–1.0)
NEUTROS ABS: 2 10*3/uL (ref 1.7–7.7)
NEUTROS PCT: 54 % (ref 43–77)
Platelets: 168 10*3/uL (ref 150–400)
RBC: 5 MIL/uL (ref 4.22–5.81)
RDW: 16 % — ABNORMAL HIGH (ref 11.5–15.5)
WBC: 3.7 10*3/uL — AB (ref 4.0–10.5)

## 2015-04-14 LAB — COMPREHENSIVE METABOLIC PANEL
ALT: 17 U/L (ref 17–63)
AST: 22 U/L (ref 15–41)
Albumin: 3.6 g/dL (ref 3.5–5.0)
Alkaline Phosphatase: 89 U/L (ref 38–126)
Anion gap: 9 (ref 5–15)
BUN: 8 mg/dL (ref 6–20)
CHLORIDE: 108 mmol/L (ref 101–111)
CO2: 19 mmol/L — AB (ref 22–32)
CREATININE: 0.98 mg/dL (ref 0.61–1.24)
Calcium: 9.1 mg/dL (ref 8.9–10.3)
GFR calc non Af Amer: >60 mL/min (ref >60)
Glucose, Bld: 94 mg/dL (ref 65–99)
POTASSIUM: 4.4 mmol/L (ref 3.5–5.1)
SODIUM: 136 mmol/L (ref 135–145)
Total Bilirubin: 0.7 mg/dL (ref 0.3–1.2)
Total Protein: 6.5 g/dL (ref 6.5–8.1)

## 2015-04-14 LAB — RAPID URINE DRUG SCREEN, HOSP PERFORMED
Amphetamines: NOT DETECTED
BARBITURATES: NOT DETECTED
Benzodiazepines: NOT DETECTED
COCAINE: NOT DETECTED
Opiates: NOT DETECTED
Tetrahydrocannabinol: NOT DETECTED

## 2015-04-14 LAB — I-STAT TROPONIN, ED: Troponin i, poc: 0 ng/mL (ref 0.00–0.08)

## 2015-04-14 LAB — ETHANOL: Alcohol, Ethyl (B): <5 mg/dL (ref <5)

## 2015-04-14 LAB — TROPONIN I: Troponin I: <0.03 ng/mL (ref <0.031)

## 2015-04-14 LAB — APTT: APTT: 24 s (ref 24–37)

## 2015-04-14 LAB — PROTIME-INR
INR: 1.09 (ref 0.00–1.49)
Prothrombin Time: 14.3 seconds (ref 11.6–15.2)

## 2015-04-14 MED ORDER — CLOPIDOGREL BISULFATE 75 MG PO TABS: 75.0000 mg | ORAL_TABLET | Freq: Every day | ORAL | Status: DC

## 2015-04-14 MED ORDER — ROSUVASTATIN CALCIUM 5 MG PO TABS
2.5000 mg | ORAL_TABLET | Freq: Every day | ORAL | Status: DC
  Administered 2015-04-14 – 2015-04-15 (×2): 2.5 mg via ORAL
  Filled 2015-04-14 (×2): qty 1

## 2015-04-14 MED ORDER — ONDANSETRON HCL 4 MG/2ML IJ SOLN: 4.0000 mg | Freq: Three times a day (TID) | INTRAMUSCULAR | Status: AC | PRN

## 2015-04-14 MED ORDER — PANTOPRAZOLE SODIUM 40 MG PO TBEC
40.0000 mg | DELAYED_RELEASE_TABLET | Freq: Every day | ORAL | Status: DC
  Administered 2015-04-15 – 2015-04-16 (×2): 40 mg via ORAL
  Filled 2015-04-14 (×2): qty 1

## 2015-04-14 MED ORDER — STROKE: EARLY STAGES OF RECOVERY BOOK
Freq: Once | Status: AC
  Administered 2015-04-14: 17:00:00
  Filled 2015-04-14: qty 1

## 2015-04-14 MED ORDER — SENNOSIDES-DOCUSATE SODIUM 8.6-50 MG PO TABS: 1.0000 | ORAL_TABLET | Freq: Every evening | ORAL | Status: DC | PRN

## 2015-04-14 MED ORDER — SODIUM CHLORIDE 0.9 % IV SOLN
INTRAVENOUS | Status: DC
  Administered 2015-04-14: 20:00:00 via INTRAVENOUS

## 2015-04-14 MED ORDER — ENOXAPARIN SODIUM 30 MG/0.3ML ~~LOC~~ SOLN
30.0000 mg | SUBCUTANEOUS | Status: DC
  Administered 2015-04-14: 30 mg via SUBCUTANEOUS
  Filled 2015-04-14: qty 0.3

## 2015-04-14 MED ORDER — ASPIRIN 325 MG PO TABS
325.0000 mg | ORAL_TABLET | Freq: Every day | ORAL | Status: DC
  Administered 2015-04-14 – 2015-04-15 (×2): 325 mg via ORAL
  Filled 2015-04-14 (×2): qty 1

## 2015-04-14 NOTE — Consult Note (Signed)
Referring Physician: Sabra Heck    Chief Complaint: right arm weakness and numbness  HPI:                                                                                                                                         Jose [name] is an 74 y.o. male presenting to ED due to right leg paresthesia.  He states he awoke last night at 0200 hours and felt normal.  He again woke up at 0430 and and had a "achy sensation in his right arm followed by tingling in his finger tips".  He went to work and around 0700 noted his whole right leg felt heavy and felt like pins and needles. Due to this he was brought to ED.  Currently he continues to have tingling in the tips of his fingers on his right hand and right leg feels as if it is tingling. He states he takes ASA daily.   Date last known well: Date: 04/13/2015 Time last known well: Time: 02:00 tPA Given: No: out of window Modified Rankin: Rankin Score=0    Past Medical History  Diagnosis Date  . ANXIETY 08/22/2008  . Barrett's esophagus 2003    hematochezia as a result since 2003  . CORONARY ARTERY DISEASE 2001    LAD stent per Dr Elonda Husky in [place]. 2 stents.   Marland Kitchen DIVERTICULOSIS, COLON 2003  . ERECTILE DYSFUNCTION 08/22/2008  . HYPERLIPIDEMIA 02/09/2007  . MYOCARDIAL INFARCTION, HX OF 2001  . NEPHROLITHIASIS, HX OF 02/09/2007    s/p lithotripsy multiple times  . OBESITY 03/20/2010  . Squamous cell cancer of skin of earlobe     removed from area behind right earlobe  . Basal cell cancer     removed from right forearm.   . Acute lower GI bleeding 01/25/2013    diverticular bleed    Past Surgical History  Procedure Laterality Date  . Lithotripsy    . Tonsilectomy, adenoidectomy, bilateral myringotomy and tubes    . Skin cancer excision      right forearm and posterior right ear.   . Colonoscopy N/A 01/26/2013    Procedure: COLONOSCOPY;  Surgeon: Milus Banister, MD;  Location: [facility];  Service: Endoscopy;  Laterality: N/A;     Family History  Problem Relation Age of Onset  . Alcohol abuse Father   . Liver disease Father   . Heart disease Brother     CABG  . Colon cancer Neg Hx    Social History:  reports that he quit smoking about 31 years ago. His smoking use included Cigarettes. He has a 20 pack-year smoking history. He has never used smokeless tobacco. He reports that he drinks alcohol. He reports that he does not use illicit drugs.  Allergies:  Allergies  Allergen Reactions  . Atorvastatin     REACTION: muscle weakness    Medications:  No current facility-administered medications for this encounter.   Current Outpatient Prescriptions  Medication Sig Dispense Refill  . ALPRAZolam (XANAX) 0.25 MG tablet TAKE 1 TABLET BY MOUTH EVERY DAY (MAX 3 DOSES PER WEEK) 30 tablet 1  . aspirin 81 MG EC tablet Take 1 tablet (81 mg total) by mouth daily. Swallow whole. 30 tablet 12  . omeprazole (PRILOSEC) 20 MG capsule Take 20 mg by mouth daily.    . rosuvastatin (CRESTOR) 5 MG tablet Take 0.5 tablets (2.5 mg total) by mouth daily. 30 tablet 6  . tadalafil (CIALIS) 20 MG tablet Take 0.5 tablets (10 mg total) by mouth daily as needed for erectile dysfunction. 8 tablet 5     ROS:                                                                                                                                       History obtained from the patient  General ROS: negative for - chills, fatigue, fever, night sweats, weight gain or weight loss Psychological ROS: negative for - behavioral disorder, hallucinations, memory difficulties, mood swings or suicidal ideation Ophthalmic ROS: negative for - blurry vision, double vision, eye pain or loss of vision ENT ROS: negative for - epistaxis, nasal discharge, oral lesions, sore throat, tinnitus or vertigo Allergy and Immunology ROS: negative for - hives  or itchy/watery eyes Hematological and Lymphatic ROS: negative for - bleeding problems, bruising or swollen lymph nodes Endocrine ROS: negative for - galactorrhea, hair pattern changes, polydipsia/polyuria or temperature intolerance Respiratory ROS: negative for - cough, hemoptysis, shortness of breath or wheezing Cardiovascular ROS: negative for - chest pain, dyspnea on exertion, edema or irregular heartbeat Gastrointestinal ROS: negative for - abdominal pain, diarrhea, hematemesis, nausea/vomiting or stool incontinence Genito-Urinary ROS: negative for - dysuria, hematuria, incontinence or urinary frequency/urgency Musculoskeletal ROS: negative for - joint swelling or muscular weakness Neurological ROS: as noted in HPI Dermatological ROS: negative for rash and skin lesion changes  Neurologic Examination:                                                                                                      Blood pressure 143/83, pulse 57, temperature 97.9 F (36.6 C), temperature source Oral, resp. rate 18, SpO2 96 %.  HEENT-  Normocephalic, no lesions, without obvious abnormality.  Normal external eye and conjunctiva.  Normal TM's bilaterally.  Normal auditory canals and external ears. Normal external nose, mucus membranes and septum.  Normal pharynx. Cardiovascular- S1, S2 normal,  pulses palpable throughout   Lungs- chest clear, no wheezing, rales, normal symmetric air entry, Heart exam - S1, S2 normal, no murmur, no gallop, rate regular Abdomen- normal findings: bowel sounds normal Extremities- no edema Lymph-no adenopathy palpable Musculoskeletal-no joint tenderness, deformity or swelling Skin-warm and dry, no hyperpigmentation, vitiligo, or suspicious lesions  Neurological Examination Mental Status: Alert, oriented, thought content appropriate.  Speech fluent without evidence of aphasia.  Able to follow 3 step commands without difficulty. Cranial Nerves: II: Discs flat bilaterally;  Visual fields grossly normal, pupils equal, round, reactive to light and accommodation III,IV, VI: ptosis not present, extra-ocular motions intact bilaterally V,VII: smile symmetric, facial light touch sensation normal bilaterally VIII: hearing normal bilaterally IX,X: uvula rises symmetrically XI: bilateral shoulder shrug XII: midline tongue extension Motor: Right : Upper extremity   5/5    Left:     Upper extremity   5/5  Lower extremity   4/5     Lower extremity   5/5 Tone and bulk:normal tone throughout; no atrophy noted Sensory: Pinprick and light touch intact throughout, bilaterally Deep Tendon Reflexes: 2+ and symmetric throughout Plantars: Right: downgoing   Left: downgoing Cerebellar: normal finger-to-nose  and normal heel-to-shin test Gait: not tested due to multiple leads.        Lab Results: Basic Metabolic Panel:  Recent Labs Lab 04/14/15 1120 04/14/15 1129  NA 136 138  K 4.4 4.4  CL 108 106  CO2 19*  --   GLUCOSE 94 96  BUN 8 10  CREATININE 0.98 1.00  CALCIUM 9.1  --     Liver Function Tests:  Recent Labs Lab 04/14/15 1120  AST 22  ALT 17  ALKPHOS 89  BILITOT 0.7  PROT 6.5  ALBUMIN 3.6   No results for input(s): LIPASE, AMYLASE in the last 168 hours. No results for input(s): AMMONIA in the last 168 hours.  CBC:  Recent Labs Lab 04/14/15 1129  HGB 14.6  HCT 43.0    Cardiac Enzymes: No results for input(s): CKTOTAL, CKMB, CKMBINDEX, TROPONINI in the last 168 hours.  Lipid Panel: No results for input(s): CHOL, TRIG, HDL, CHOLHDL, VLDL, LDLCALC in the last 168 hours.  CBG: No results for input(s): GLUCAP in the last 168 hours.  Microbiology: No results found for this or any previous visit.  Coagulation Studies:  Recent Labs  04/14/15 1120  LABPROT 14.3  INR 1.09    Imaging: Ct Head Wo Contrast  04/14/2015   CLINICAL DATA:  RIGHT-sided weakness. Numbness in the hands and fingers. Onset of symptoms at 4 a.m. Initial  encounter.  EXAM: CT HEAD WITHOUT CONTRAST  TECHNIQUE: Contiguous axial images were obtained from the base of the skull through the vertex without intravenous contrast.  COMPARISON:  None.  FINDINGS: No mass lesion, mass effect, midline shift, hydrocephalus, hemorrhage. No territorial ischemia or acute infarction. Visible paranasal sinuses clear.  IMPRESSION: Negative CT head.   Electronically Signed   By: Dereck Ligas M.D.   On: 04/14/2015 12:12       Assessment and plan discussed with with attending physician and they are in agreement.    Jose Quill PA-C [facility] [phone]  04/14/2015, 12:26 PM   Assessment: 74 y.o. male male hx of HLD presenting to ED with right leg paresthesia.  Exam is notable for subjective tingling in the right hand and right leg.  He has mild weakness of the right leg. Atypical presentation but given sudden onset and continued symptoms cannot rule out CVA.  Stroke Risk Factors - hyperlipidemia  Recommend: 1. HgbA1c, fasting lipid panel 2. MRI, MRA  of the brain without contrast 3. PT consult, OT consult, Speech consult 4. Echocardiogram 5. Carotid dopplers 6. Prophylactic therapy-Antiplatelet med: Aspirin - dose 325 mg daily 7. Risk factor modification 8. Telemetry monitoring 9. Frequent neuro checks 10 NPO until passes stroke swallow screen  Jim Like, DO [facility] [phone]  If 7pm- 7am, please page neurology on call as listed in [place].

## 2015-04-14 NOTE — ED Notes (Signed)
Pt here for weakness and numbness in right arm. sts no strength in right arm. sts he woke up this way but not progressing to right leg.

## 2015-04-14 NOTE — H&P (Signed)
[facility] History and Physical  DUSHAWN PUSEY KGU:[id number] DOB: 1941/05/19 DOA: 04/14/2015  Referring physician: Dr. Noemi Chapel PCP: [name], MD   Chief Complaint: Right-sided weakness  HPI: Jose OLIVERA is a 74 y.o. male  With history of anxiety, coronary artery disease, diverticulosis, hyperlipidemia, history of nephrolithiasis who presents to the ED with sudden onset of right-sided weakness and numbness. Patient stated he went to bed around the 30 p.m. the night prior to admission and was in his usual state of health. Patient woke up around 2 AM and was still in his usual state of health. Patient woke up around 4 AM and felt a discomfort in his right upper extremity. Patient walked around however no improvement patient had his coffee had some right upper extremity weakness as well. Patient subsequently went to work and while at work in notes some tingling in his right lower extremity and his right fingertips. Patient stated he stated work for about 1-2 hours have her symptoms were worsening and subsequently presented to the ED. Patient denies any fevers, no chills, no nausea, no vomiting, no cough, no melanotic overt hematemesis, no hematochezia, no constipation, no diarrhea, no chest pain, no shortness of breath, no dysuria, no visual changes, no headaches, no facial asymmetry or weakness. Patient was seen in the ED head CT which was obtained was negative. Neurology was consulted. [facility] were called to admit the patient for further evaluation and management.   Review of Systems: As history of present illness otherwise negative. Constitutional:  No weight loss, night sweats, Fevers, chills, fatigue.  HEENT:  No headaches, Difficulty swallowing,Tooth/dental problems,Sore throat,  No sneezing, itching, ear ache, nasal congestion, post nasal drip,  Cardio-vascular:  No chest pain, Orthopnea, PND, swelling in lower extremities, anasarca, dizziness,  palpitations  GI:  No heartburn, indigestion, abdominal pain, nausea, vomiting, diarrhea, change in bowel habits, loss of appetite  Resp:  No shortness of breath with exertion or at rest. No excess mucus, no productive cough, No non-productive cough, No coughing up of blood.No change in color of mucus.No wheezing.No chest wall deformity  Skin:  no rash or lesions.  GU:  no dysuria, change in color of urine, no urgency or frequency. No flank pain.  Musculoskeletal:  No joint pain or swelling. No decreased range of motion. No back pain.  Psych:  No change in mood or affect. No depression or anxiety. No memory loss.   Past Medical History  Diagnosis Date  . ANXIETY 08/22/2008  . Barrett's esophagus 2003    hematochezia as a result since 2003  . CORONARY ARTERY DISEASE 2001    LAD stent per Dr Elonda Husky in [place]. 2 stents.   Marland Kitchen DIVERTICULOSIS, COLON 2003  . ERECTILE DYSFUNCTION 08/22/2008  . HYPERLIPIDEMIA 02/09/2007  . MYOCARDIAL INFARCTION, HX OF 2001  . NEPHROLITHIASIS, HX OF 02/09/2007    s/p lithotripsy multiple times  . OBESITY 03/20/2010  . Squamous cell cancer of skin of earlobe     removed from area behind right earlobe  . Basal cell cancer     removed from right forearm.   . Acute lower GI bleeding 01/25/2013    diverticular bleed  . Bradycardia 04/14/2015   Past Surgical History  Procedure Laterality Date  . Lithotripsy    . Tonsilectomy, adenoidectomy, bilateral myringotomy and tubes    . Skin cancer excision      right forearm and posterior right ear.   . Colonoscopy N/A 01/26/2013    Procedure: COLONOSCOPY;  Surgeon: Milus Banister, MD;  Location: [facility];  Service: Endoscopy;  Laterality: N/A;   Social History:  reports that he quit smoking about 31 years ago. His smoking use included Cigarettes. He has a 20 pack-year smoking history. He has never used smokeless tobacco. He reports that he drinks alcohol. He reports that he does not use illicit  drugs.  Allergies  Allergen Reactions  . Atorvastatin     REACTION: muscle weakness    Family History  Problem Relation Age of Onset  . Alcohol abuse Father   . Liver disease Father   . Heart disease Brother     CABG  . Colon cancer Neg Hx      Prior to Admission medications   Medication Sig Start Date End Date Taking? Authorizing Provider  aspirin 81 MG EC tablet Take 1 tablet (81 mg total) by mouth daily. Swallow whole. 10/18/14  Yes Hosie Poisson, MD  omeprazole (PRILOSEC) 20 MG capsule Take 20 mg by mouth daily.   Yes Historical Provider, MD  rosuvastatin (CRESTOR) 5 MG tablet Take 0.5 tablets (2.5 mg total) by mouth daily. 02/26/13  Yes Bruce Kendall Flack, MD  ALPRAZolam (XANAX) 0.25 MG tablet TAKE 1 TABLET BY MOUTH EVERY DAY (MAX 3 DOSES PER WEEK) Patient not taking: Reported on 04/14/2015 12/09/14   Marin Olp, MD  tadalafil (CIALIS) 20 MG tablet Take 0.5 tablets (10 mg total) by mouth daily as needed for erectile dysfunction. Patient not taking: Reported on 04/14/2015 08/07/14   Marin Olp, MD   Physical Exam: Filed Vitals:   04/14/15 1114  BP: 143/83  Pulse: 57  Temp: 97.9 F (36.6 C)  TempSrc: Oral  Resp: 18  SpO2: 96%    Wt Readings from Last 3 Encounters:  10/15/14 89.495 kg (197 lb 4.8 oz)  08/07/14 96.616 kg (213 lb)  03/29/14 92.534 kg (204 lb)    General:  well-developed well-nourished in no acute cardiopulmonary distress. Speaking in full sentences.  Eyes: PERRLA, EOMI, normal lids, irises & conjunctiva ENT: grossly normal hearing, lips & tongue. Oropharynx is clear, no lesions, no exudates. Neck: no LAD, masses or thyromegaly. Cardiovascular: RRR, no m/r/g. No LE edema. Telemetry: SR, no arrhythmias  Respiratory: CTA bilaterally, no w/r/r. Normal respiratory effort. Abdomen: soft, ntnd, positive bowel sounds, no rebound, no guarding  Skin: no rash or induration seen on limited exam Musculoskeletal: grossly normal tone BUE/BLE.5 out of 5 left  upper extremity strength. 5 out of 5 left lower extremity strength. 3 to a 4 out of 5 right upper extremity strength. 3-4 out of 5 right lower extremity strength.  Psychiatric: grossly normal mood and affect, speech fluent and appropriate Neurologic:  alert and oriented 3. Cranial nerves II through XII are grossly intact. Visual fields are intact. Sensation seems intact. 2+ brachioradialis reflexes bilaterally. Unable to assess patellar and acutely his reflexes symmetrically and bilaterally. Some slight dysmetria on the right on finger-to-nose. Heel-to-shin is within normal limits. Gait not tested secondary to safety.           Labs on Admission:  Basic Metabolic Panel:  Recent Labs Lab 04/14/15 1120 04/14/15 1129  NA 136 138  K 4.4 4.4  CL 108 106  CO2 19*  --   GLUCOSE 94 96  BUN 8 10  CREATININE 0.98 1.00  CALCIUM 9.1  --    Liver Function Tests:  Recent Labs Lab 04/14/15 1120  AST 22  ALT 17  ALKPHOS 89  BILITOT 0.7  PROT 6.5  ALBUMIN 3.6   No results for input(s): LIPASE, AMYLASE in the last 168 hours. No results for input(s): AMMONIA in the last 168 hours. CBC:  Recent Labs Lab 04/14/15 1129 04/14/15 1230  WBC  --  3.7*  NEUTROABS  --  2.0  HGB 14.6 13.2  HCT 43.0 39.6  MCV  --  79.2  PLT  --  168   Cardiac Enzymes: No results for input(s): CKTOTAL, CKMB, CKMBINDEX, TROPONINI in the last 168 hours.  BNP (last 3 results) No results for input(s): BNP in the last 8760 hours.  ProBNP (last 3 results) No results for input(s): PROBNP in the last 8760 hours.  CBG: No results for input(s): GLUCAP in the last 168 hours.  Radiological Exams on Admission: Ct Head Wo Contrast  04/14/2015   CLINICAL DATA:  RIGHT-sided weakness. Numbness in the hands and fingers. Onset of symptoms at 4 a.m. Initial encounter.  EXAM: CT HEAD WITHOUT CONTRAST  TECHNIQUE: Contiguous axial images were obtained from the base of the skull through the vertex without intravenous  contrast.  COMPARISON:  None.  FINDINGS: No mass lesion, mass effect, midline shift, hydrocephalus, hemorrhage. No territorial ischemia or acute infarction. Visible paranasal sinuses clear.  IMPRESSION: Negative CT head.   Electronically Signed   By: Dereck Ligas M.D.   On: 04/14/2015 12:12    EKG: Independently reviewed.  normal sinus rhythm. Poor R-wave progression.   Assessment/Plan Principal Problem:   Stroke Active Problems:   Right sided weakness   Generalized anxiety disorder   Coronary atherosclerosis   Diverticulosis of large intestine   Bradycardia   CVA (cerebral infarction)   #1 right-sided weakness/rule out CVA Patient presented to the ED with right-sided weakness and some numbness concern for acute CVA. CT head is negative. Urinalysis is negative. Patient with no pulmonary symptoms. Doubt infectious etiology. We'll admit patient for stroke rule out. Will check MRI/MRA of the head. Check carotid Dopplers. Check 2-D echo. Check a fasting lipid panel. Check a hemoglobin A1c. PT/OT. Patient was on a baby aspirin prior to admission and will change to a full dose aspirin per neurology recommendations. Follow.  #2 general anxiety disorder Continue home regimen of anxiolytics.  #3 bradycardia Patient states is chronic. Patient states beta blocker was discontinued because of his bradycardia. Outpatient follow-up.  #4 coronary artery disease Stable. Continue Crestor, aspirin.  #5 prophylaxis PPI for GI prophylaxis. Lovenox for DVT prophylaxis.    Code Status: Full DVT Prophylaxis: Lovenox. Family Communication: Updated patient and wife at bedside. Disposition Plan: Admit to telemetry.  Time spent: 35 mins  [facility] Pager [phone]

## 2015-04-14 NOTE — ED Provider Notes (Signed)
CSN: [id number]     Arrival date & time 04/14/15  1102 History   First MD Initiated Contact with Patient 04/14/15 1116     Chief Complaint  Patient presents with  . Weakness  . Numbness     (Consider location/radiation/quality/duration/timing/severity/associated sxs/prior Treatment) HPI  74 year old male, history of myocardial infarction in the past, states that when he woke up at 4:00 in the morning he was having some discomfort in his right arm, this is poorly described but states it felt like it was asleep, again at 6:00 this morning he tried to go to work but states that while he was walking he was having some right leg discomfort and states it was hard to walk. He has no difficulty with speech, the symptoms are persistent, they're not getting worse, he has never had these symptoms before and when he went to bed last night he was not having any symptoms whatsoever. He does report having a recent stress test which she states was normal.  Past Medical History  Diagnosis Date  . ANXIETY 08/22/2008  . Barrett's esophagus 2003    hematochezia as a result since 2003  . DIVERTICULOSIS, COLON 2003  . ERECTILE DYSFUNCTION 08/22/2008  . HYPERLIPIDEMIA 02/09/2007  . OBESITY 03/20/2010  . Squamous cell cancer of skin of earlobe     removed from area behind right earlobe  . Basal cell cancer     removed from right forearm.   . Acute lower GI bleeding 01/25/2013; 09/2014    diverticular bleed  . Bradycardia 04/14/2015  . CORONARY ARTERY DISEASE   . MI (myocardial infarction) 2000  . Kidney stones     "blasted or passed" (04/14/2015)  . GERD (gastroesophageal reflux disease)   . Arthritis     "a little bit" (04/14/2015)  . Heart murmur 04/14/2015    "they think I did; that's why I'm here" (04/14/2015)   Past Surgical History  Procedure Laterality Date  . Lithotripsy  X 2  . Basal cell carcinoma excision      posterior right ear.   . Colonoscopy N/A 01/26/2013    Procedure: COLONOSCOPY;   Surgeon: Milus Banister, MD;  Location: [facility];  Service: Endoscopy;  Laterality: N/A;  . Tonsillectomy  1971  . Squamous cell carcinoma excision      right forearm  . Esophagogastroduodenoscopy (egd) with esophageal dilation      "q time I have an EGD; usually q 3-4 years" (04/14/2015)  . Coronary angioplasty with stent placement  09/06/1998    LAD stent per Dr Elonda Husky in [place]. 2 stents.    Family History  Problem Relation Age of Onset  . Alcohol abuse Father   . Liver disease Father   . Heart disease Brother     CABG  . Colon cancer Neg Hx    Social History  Substance Use Topics  . Smoking status: Former Smoker -- 1.00 packs/day for 30 years    Types: Cigarettes  . Smokeless tobacco: Never Used     Comment: "quit smoking cigarettes in ~ 1990"  . Alcohol Use: Yes     Comment: 04/14/2015 "might drink a couple beers/month"    Review of Systems  All other systems reviewed and are negative.     Allergies  Atorvastatin  Home Medications   Prior to Admission medications   Medication Sig Start Date End Date Taking? Authorizing Provider  aspirin 81 MG EC tablet Take 1 tablet (81 mg total) by mouth daily. Swallow whole.  10/18/14  Yes Hosie Poisson, MD  omeprazole (PRILOSEC) 20 MG capsule Take 20 mg by mouth daily.   Yes Historical Provider, MD  rosuvastatin (CRESTOR) 5 MG tablet Take 0.5 tablets (2.5 mg total) by mouth daily. 02/26/13  Yes Bruce Kendall Flack, MD  ALPRAZolam (XANAX) 0.25 MG tablet TAKE 1 TABLET BY MOUTH EVERY DAY (MAX 3 DOSES PER WEEK) Patient not taking: Reported on 04/14/2015 12/09/14   Marin Olp, MD  tadalafil (CIALIS) 20 MG tablet Take 0.5 tablets (10 mg total) by mouth daily as needed for erectile dysfunction. Patient not taking: Reported on 04/14/2015 08/07/14   Marin Olp, MD   BP 110/53 mmHg  Pulse 55  Temp(Src) 98.1 F (36.7 C) (Oral)  Resp 20  Ht 5\' 10"  (1.778 m)  Wt 208 lb 12.8 oz (94.711 kg)  BMI 29.96 kg/m2  SpO2 93% Physical Exam   Constitutional: He appears well-developed and well-nourished. No distress.  HENT:  Head: Normocephalic and atraumatic.  Mouth/Throat: Oropharynx is clear and moist. No oropharyngeal exudate.  Eyes: Conjunctivae and EOM are normal. Pupils are equal, round, and reactive to light. Right eye exhibits no discharge. Left eye exhibits no discharge. No scleral icterus.  Neck: Normal range of motion. Neck supple. No JVD present. No thyromegaly present.  Cardiovascular: Normal rate, regular rhythm, normal heart sounds and intact distal pulses.  Exam reveals no gallop and no friction rub.   No murmur heard. Pulmonary/Chest: Effort normal and breath sounds normal. No respiratory distress. He has no wheezes. He has no rales.  Abdominal: Soft. Bowel sounds are normal. He exhibits no distension and no mass. There is no tenderness.  Musculoskeletal: Normal range of motion. He exhibits no edema or tenderness.  Lymphadenopathy:    He has no cervical adenopathy.  Neurological: He is alert. Coordination normal.  Strength is normal with the left upper and left lower extremity, there is a slight asymmetry with weakness in the right upper and right lower extremity. Finger-nose-finger is slightly off with the right hand, normal rapid alternating movements, normal speech, cranial nerves III through XII are normal  Skin: Skin is warm and dry. No rash noted. No erythema.  Psychiatric: He has a normal mood and affect. His behavior is normal.  Nursing note and vitals reviewed.   ED Course  Procedures (including critical care time) Labs Review Labs Reviewed  COMPREHENSIVE METABOLIC PANEL - Abnormal; Notable for the following:    CO2 19 (*)    All other components within normal limits  CBC WITH DIFFERENTIAL/PLATELET - Abnormal; Notable for the following:    WBC 3.7 (*)    RDW 16.0 (*)    Monocytes Relative 13 (*)    All other components within normal limits  LIPID PANEL - Abnormal; Notable for the following:     HDL 33 (*)    All other components within normal limits  CBC - Abnormal; Notable for the following:    WBC 3.2 (*)    RDW 16.2 (*)    All other components within normal limits  URINE CULTURE  PROTIME-INR  APTT  ETHANOL  URINE RAPID DRUG SCREEN, HOSP PERFORMED  URINALYSIS, ROUTINE W REFLEX MICROSCOPIC (NOT AT [facility])  TROPONIN I  BASIC METABOLIC PANEL  HEMOGLOBIN A1C  I-STAT TROPOININ, ED  CBG MONITORING, ED  I-STAT CHEM 8, ED  I-STAT CHEM 8, ED  I-STAT TROPOININ, ED    Imaging Review Dg Chest 2 View  04/14/2015   CLINICAL DATA:  Stroke  EXAM: CHEST  2 VIEW  COMPARISON:  09/10/2004  FINDINGS: Lungs are clear.  No pleural effusion or pneumothorax.  The heart is normal in size.  Visualized osseous structures are within normal limits.  IMPRESSION: No evidence of acute cardiopulmonary disease.   Electronically Signed   By: Julian Hy M.D.   On: 04/14/2015 19:08   Ct Head Wo Contrast  04/14/2015   CLINICAL DATA:  RIGHT-sided weakness. Numbness in the hands and fingers. Onset of symptoms at 4 a.m. Initial encounter.  EXAM: CT HEAD WITHOUT CONTRAST  TECHNIQUE: Contiguous axial images were obtained from the base of the skull through the vertex without intravenous contrast.  COMPARISON:  None.  FINDINGS: No mass lesion, mass effect, midline shift, hydrocephalus, hemorrhage. No territorial ischemia or acute infarction. Visible paranasal sinuses clear.  IMPRESSION: Negative CT head.   Electronically Signed   By: Dereck Ligas M.D.   On: 04/14/2015 12:12   Mr Brain Wo Contrast  04/14/2015   CLINICAL DATA:  Sudden onset of right-sided weakness. Tingling in the RIGHT lower extremity. Onset of symptoms at 4 a.m. earlier today. Initial encounter.  EXAM: MRI HEAD WITHOUT CONTRAST  MRA HEAD WITHOUT CONTRAST  TECHNIQUE: Multiplanar, multiecho pulse sequences of the brain and surrounding structures were obtained without intravenous contrast. Angiographic images of the head were obtained using MRA  technique without contrast.  COMPARISON:  CT head earlier today.  FINDINGS: MRI HEAD FINDINGS  No evidence for acute infarction, hemorrhage, mass lesion, hydrocephalus, or extra-axial fluid. Mild cerebral and cerebellar atrophy. Mild subcortical and periventricular T2 and FLAIR hyperintensities, likely chronic microvascular ischemic change. Pituitary, pineal, and cerebellar tonsils unremarkable. No upper cervical lesions. Flow voids are maintained throughout the carotid, basilar, and vertebral arteries. There are no areas of chronic hemorrhage. Visualized calvarium, skull base, and upper cervical osseous structures unremarkable. Scalp and extracranial soft tissues, orbits, sinuses, and mastoids show no acute process.  MRA HEAD FINDINGS  The internal carotid arteries are widely patent. The basilar artery is widely patent. LEFT vertebral is dominant. No intracranial stenosis or visible aneurysm. Fetal RIGHT PCA.  IMPRESSION: No visible intracranial flow reducing lesion.  Mild atrophy and small vessel disease. No acute intracranial findings.   Electronically Signed   By: Staci Righter M.D.   On: 04/14/2015 20:05   Mr Cervical Spine W Wo Contrast  04/15/2015   CLINICAL DATA:  74 year old male with unexplained sudden onset right upper and lower extremity weakness  EXAM: MRI THORACIC AND CERVICAL SPINE WITHOUT AND WITH CONTRAST  TECHNIQUE: Multiplanar and multiecho pulse sequences of the thoracic and cervical spine were obtained without and with intravenous contrast.  COMPARISON:  Brain MRI and intracranial MRA 04/14/2015. Head CT without contrast 04/14/2015. Chest radiographs 04/14/2015, and earlier.  FINDINGS: MR CERVICAL SPINE FINDINGS  Mild reversal of cervical lordosis. No marrow edema or evidence of acute osseous abnormality.  Cervicomedullary junction is within normal limits. No cervical spinal cord signal abnormality. No abnormal enhancement identified.  Loss of the right vertebral artery flow void throughout  the neck most suggestive of occlusion. On the comparison there appeared to be adequate reconstitution of the distal right vertebral artery. Otherwise negative paraspinal soft tissues.  The following superimposed degenerative changes are noted:  C2-C3: Mild facet hypertrophy.  C3-C4: Mild circumferential disc osteophyte complex and facet hypertrophy. No spinal stenosis. Moderate to severe C4 foraminal stenosis.  C4-C5: Disc space loss. Circumferential disc osteophyte complex. Broad-based posterior component of disc. Moderate ligament flavum hypertrophy. Spinal stenosis with mild spinal cord mass  effect, no cord signal abnormality. Severe C5 foraminal stenosis.  C5-C6: Disc space loss. Circumferential disc osteophyte complex. Broad-based posterior component of disc. Spinal stenosis with mild spinal cord flattening, no cord signal abnormality. Severe C6 foraminal stenosis.  C6-C7: Disc space loss. Circumferential disc osteophyte complex. Broad-based slightly right eccentric posterior component of disc. Borderline to mild spinal stenosis, no cord mass effect. Moderate C7 foraminal stenosis.  C7-T1: Mild to moderate facet hypertrophy on the left. Mild left C8 foraminal stenosis.  MR THORACIC SPINE FINDINGS  Normal thoracic vertebral height and alignment. No marrow edema or evidence of acute osseous abnormality.  Normally patent thoracic spinal canal. No thoracic spinal stenosis. Spinal cord signal is within normal limits at all visualized levels. No abnormal intradural enhancement ; suspect physiologic anterior and posterior spinal artery enhancement in the mid to lower thoracic spine.  Negative visualized thoracic and upper abdominal viscera. Negative visualized posterior paraspinal soft tissues.  The following superimposed degenerative changes are noted:  T1-T2:  Mild facet hypertrophy on the left, no stenosis.  T3-T4: Mild right eccentric disc bulge. Mild right T3 foraminal stenosis.  T5-T6: Subtle central disc  protrusion, no stenosis.  T7-T8: Small central disc protrusion (series 14, image 20). No stenosis.  IMPRESSION: MR CERVICAL SPINE IMPRESSION  1. Widespread cervical spine degeneration. Multifactorial mild spinal stenosis from C4-C5 to C6-C7. Mild if any spinal cord mass effect and no cord signal abnormality. 2. Moderate or severe cervical foraminal stenosis at the bilateral C4, C5, C6, and C7 nerve levels. 3. 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  MR THORACIC SPINE IMPRESSION  Mild for age thoracic spine degeneration. No thoracic spinal stenosis or convincing neural impingement. Normal thoracic spinal cord.   Electronically Signed   By: Genevie Ann M.D.   On: 04/15/2015 19:29   Mr Thoracic Spine W Wo Contrast  04/15/2015   CLINICAL DATA:  74 year old male with unexplained sudden onset right upper and lower extremity weakness  EXAM: MRI THORACIC AND CERVICAL SPINE WITHOUT AND WITH CONTRAST  TECHNIQUE: Multiplanar and multiecho pulse sequences of the thoracic and cervical spine were obtained without and with intravenous contrast.  COMPARISON:  Brain MRI and intracranial MRA 04/14/2015. Head CT without contrast 04/14/2015. Chest radiographs 04/14/2015, and earlier.  FINDINGS: MR CERVICAL SPINE FINDINGS  Mild reversal of cervical lordosis. No marrow edema or evidence of acute osseous abnormality.  Cervicomedullary junction is within normal limits. No cervical spinal cord signal abnormality. No abnormal enhancement identified.  Loss of the right vertebral artery flow void throughout the neck most suggestive of occlusion. On the comparison there appeared to be adequate reconstitution of the distal right vertebral artery. Otherwise negative paraspinal soft tissues.  The following superimposed degenerative changes are noted:  C2-C3: Mild facet hypertrophy.  C3-C4: Mild circumferential disc osteophyte complex and  facet hypertrophy. No spinal stenosis. Moderate to severe C4 foraminal stenosis.  C4-C5: Disc space loss. Circumferential disc osteophyte complex. Broad-based posterior component of disc. Moderate ligament flavum hypertrophy. Spinal stenosis with mild spinal cord mass effect, no cord signal abnormality. Severe C5 foraminal stenosis.  C5-C6: Disc space loss. Circumferential disc osteophyte complex. Broad-based posterior component of disc. Spinal stenosis with mild spinal cord flattening, no cord signal abnormality. Severe C6 foraminal stenosis.  C6-C7: Disc space loss. Circumferential disc osteophyte complex. Broad-based slightly right eccentric posterior component of disc. Borderline to mild spinal stenosis, no cord mass effect. Moderate C7 foraminal stenosis.  C7-T1: Mild  to moderate facet hypertrophy on the left. Mild left C8 foraminal stenosis.  MR THORACIC SPINE FINDINGS  Normal thoracic vertebral height and alignment. No marrow edema or evidence of acute osseous abnormality.  Normally patent thoracic spinal canal. No thoracic spinal stenosis. Spinal cord signal is within normal limits at all visualized levels. No abnormal intradural enhancement ; suspect physiologic anterior and posterior spinal artery enhancement in the mid to lower thoracic spine.  Negative visualized thoracic and upper abdominal viscera. Negative visualized posterior paraspinal soft tissues.  The following superimposed degenerative changes are noted:  T1-T2:  Mild facet hypertrophy on the left, no stenosis.  T3-T4: Mild right eccentric disc bulge. Mild right T3 foraminal stenosis.  T5-T6: Subtle central disc protrusion, no stenosis.  T7-T8: Small central disc protrusion (series 14, image 20). No stenosis.  IMPRESSION: MR CERVICAL SPINE IMPRESSION  1. Widespread cervical spine degeneration. Multifactorial mild spinal stenosis from C4-C5 to C6-C7. Mild if any spinal cord mass effect and no cord signal abnormality. 2. Moderate or severe  cervical foraminal stenosis at the bilateral C4, C5, C6, and C7 nerve levels. 3. 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  MR THORACIC SPINE IMPRESSION  Mild for age thoracic spine degeneration. No thoracic spinal stenosis or convincing neural impingement. Normal thoracic spinal cord.   Electronically Signed   By: Genevie Ann M.D.   On: 04/15/2015 19:29   Mr Jodene Nam Head/brain Wo Cm  04/14/2015   CLINICAL DATA:  Sudden onset of right-sided weakness. Tingling in the RIGHT lower extremity. Onset of symptoms at 4 a.m. earlier today. Initial encounter.  EXAM: MRI HEAD WITHOUT CONTRAST  MRA HEAD WITHOUT CONTRAST  TECHNIQUE: Multiplanar, multiecho pulse sequences of the brain and surrounding structures were obtained without intravenous contrast. Angiographic images of the head were obtained using MRA technique without contrast.  COMPARISON:  CT head earlier today.  FINDINGS: MRI HEAD FINDINGS  No evidence for acute infarction, hemorrhage, mass lesion, hydrocephalus, or extra-axial fluid. Mild cerebral and cerebellar atrophy. Mild subcortical and periventricular T2 and FLAIR hyperintensities, likely chronic microvascular ischemic change. Pituitary, pineal, and cerebellar tonsils unremarkable. No upper cervical lesions. Flow voids are maintained throughout the carotid, basilar, and vertebral arteries. There are no areas of chronic hemorrhage. Visualized calvarium, skull base, and upper cervical osseous structures unremarkable. Scalp and extracranial soft tissues, orbits, sinuses, and mastoids show no acute process.  MRA HEAD FINDINGS  The internal carotid arteries are widely patent. The basilar artery is widely patent. LEFT vertebral is dominant. No intracranial stenosis or visible aneurysm. Fetal RIGHT PCA.  IMPRESSION: No visible intracranial flow reducing lesion.  Mild atrophy and small vessel disease. No acute  intracranial findings.   Electronically Signed   By: Staci Righter M.D.   On: 04/14/2015 20:05   INoemi Chapel D, personally reviewed and evaluated these images and lab results as part of my medical decision-making.   EKG Interpretation   Date/Time:  Monday April 14 2015 12:51:44 EDT Ventricular Rate:  51 PR Interval:  187 QRS Duration: 106 QT Interval:  468 QTC Calculation: 431 R Axis:   57 Text Interpretation:  Sinus rhythm Abnormal R-wave progression, early  transition No old tracing to compare Confirmed by Kalisa Girtman  MD, [place]  [phone]) on 04/14/2015 12:53:43 PM      MDM   Final diagnoses:  Stroke    The patient has new symptoms of potential stroke, his symptoms started prior to 4 AM  when he awoke with symptoms, this puts him outside the window for thrombolytic therapy as the 8 hour mark would be approximately 5 minutes. We'll discuss with neurology, symptoms are subtle, no speech or breathing issues, no visual issues, vital signs otherwise unremarkable.  Discussed with neurology, they will see in consultation in the emergency department  Discussed with the hospitalist Dr. Grandville Silos, he will see the patient for admission.    Noemi Chapel, MD 04/16/15 [phone]

## 2015-04-15 ENCOUNTER — Inpatient Hospital Stay (HOSPITAL_COMMUNITY): Payer: Medicare Other

## 2015-04-15 DIAGNOSIS — I639 Cerebral infarction, unspecified: Secondary | ICD-10-CM

## 2015-04-15 DIAGNOSIS — R200 Anesthesia of skin: Secondary | ICD-10-CM | POA: Insufficient documentation

## 2015-04-15 DIAGNOSIS — I6789 Other cerebrovascular disease: Secondary | ICD-10-CM

## 2015-04-15 DIAGNOSIS — R202 Paresthesia of skin: Secondary | ICD-10-CM

## 2015-04-15 DIAGNOSIS — M6289 Other specified disorders of muscle: Secondary | ICD-10-CM

## 2015-04-15 DIAGNOSIS — E785 Hyperlipidemia, unspecified: Secondary | ICD-10-CM | POA: Insufficient documentation

## 2015-04-15 DIAGNOSIS — R001 Bradycardia, unspecified: Secondary | ICD-10-CM

## 2015-04-15 LAB — LIPID PANEL
CHOL/HDL RATIO: 4.4 ratio
Cholesterol: 146 mg/dL (ref 0–200)
HDL: 33 mg/dL — ABNORMAL LOW (ref >40)
LDL CALC: 95 mg/dL (ref 0–99)
Triglycerides: 88 mg/dL (ref <150)
VLDL: 18 mg/dL (ref 0–40)

## 2015-04-15 LAB — BASIC METABOLIC PANEL
Anion gap: 5 (ref 5–15)
BUN: 8 mg/dL (ref 6–20)
CALCIUM: 8.9 mg/dL (ref 8.9–10.3)
CO2: 24 mmol/L (ref 22–32)
CREATININE: 0.96 mg/dL (ref 0.61–1.24)
Chloride: 111 mmol/L (ref 101–111)
GFR calc non Af Amer: >60 mL/min (ref >60)
Glucose, Bld: 99 mg/dL (ref 65–99)
Potassium: 3.8 mmol/L (ref 3.5–5.1)
SODIUM: 140 mmol/L (ref 135–145)

## 2015-04-15 LAB — CBC
HCT: 39.5 % (ref 39.0–52.0)
Hemoglobin: 13.1 g/dL (ref 13.0–17.0)
MCH: 26.5 pg (ref 26.0–34.0)
MCHC: 33.2 g/dL (ref 30.0–36.0)
MCV: 79.8 fL (ref 78.0–100.0)
PLATELETS: 157 10*3/uL (ref 150–400)
RBC: 4.95 MIL/uL (ref 4.22–5.81)
RDW: 16.2 % — ABNORMAL HIGH (ref 11.5–15.5)
WBC: 3.2 10*3/uL — AB (ref 4.0–10.5)

## 2015-04-15 MED ORDER — ROSUVASTATIN CALCIUM 10 MG PO TABS: 10.0000 mg | ORAL_TABLET | Freq: Every day | ORAL | Status: DC

## 2015-04-15 MED ORDER — ASPIRIN EC 81 MG PO TBEC
81.0000 mg | DELAYED_RELEASE_TABLET | Freq: Every day | ORAL | Status: DC
  Administered 2015-04-16: 81 mg via ORAL
  Filled 2015-04-15: qty 1

## 2015-04-15 MED ORDER — GADOBENATE DIMEGLUMINE 529 MG/ML IV SOLN
20.0000 mL | Freq: Once | INTRAVENOUS | Status: AC | PRN
  Administered 2015-04-15: 20 mL via INTRAVENOUS

## 2015-04-15 MED ORDER — ENOXAPARIN SODIUM 40 MG/0.4ML ~~LOC~~ SOLN
40.0000 mg | SUBCUTANEOUS | Status: DC
  Administered 2015-04-15: 40 mg via SUBCUTANEOUS
  Filled 2015-04-15: qty 0.4

## 2015-04-15 NOTE — Evaluation (Signed)
Physical Therapy Evaluation Patient Details Name: Jose [name] MRN: [id number] DOB: February 03, 1941 Today's Date: 04/15/2015   History of Present Illness  53 yomale with onset of generalized LE weakness and R side numbness with negative MRI findings, still awaiting imaging of spine.  PMHx:  CAD, CABG, bradycardia  Clinical Impression  Pt was able to walk with some minor help using IV pole, and PT recommended walker as he is not fully stable for cane today.  He objects to the walker plan as he is working and doesn't feel it is needed.  Will have nursing continue to ambulate as much as able and will reassess balance tomorrow.    Follow Up Recommendations Outpatient PT    Equipment Recommendations  Rolling walker with 5" wheels (owns a cane)    Recommendations for Other Services       Precautions / Restrictions Precautions Precautions: Fall;Other (comment) (telemetry) Restrictions Weight Bearing Restrictions: No      Mobility  Bed Mobility               General bed mobility comments: up when PT entered  Transfers Overall transfer level: Needs assistance Equipment used:  (IV pole for support) Transfers: Sit to/from [facility] Sit to Stand: Supervision;Min guard Stand pivot transfers: Supervision;Min guard       General transfer comment: uses IV pole to stabilize initial standing  Ambulation/Gait Ambulation/Gait assistance: Min guard Ambulation Distance (Feet): 150 Feet Assistive device: 1 person hand held assist (IV pole) Gait Pattern/deviations: Step-through pattern;Wide base of support;Drifts right/left Gait velocity: reduced   General Gait Details: Pt has some difficulty with controlling his gait on hallway due to poor sensation RLE per his report but generally weak as well in LE's not related to one side.  Pt is candidate for RW today but did get improved sensation from numb to tingling by walking with R side.  Will see if cane is practical  tomorrow but today needs more assist.  Pt is refusing walker and will agree to outpatient therapy as he can schedule (still employed).   Stairs            Wheelchair Mobility    Modified Rankin (Stroke Patients Only)       Balance Overall balance assessment: Needs assistance Sitting-balance support: Feet supported Sitting balance-Leahy Scale: Good   Postural control: Posterior lean Standing balance support: Single extremity supported;Bilateral upper extremity supported Standing balance-Leahy Scale: Fair Standing balance comment: fair- dynamic                             Pertinent Vitals/Pain Pain Assessment: No/denies pain    Home Living Family/patient expects to be discharged to:: Private residence Living Arrangements: Spouse/significant other Available Help at Discharge: Family;Available 24 hours/day Type of Home: House Home Access: Level entry     Home Layout: Two level Home Equipment: Cane - single point;Grab bars - tub/shower Additional Comments: Pt is against usign more than a SPC for an assistive device    Prior Function Level of Independence: Independent               Hand Dominance        Extremity/Trunk Assessment   Upper Extremity Assessment: Overall WFL for tasks assessed           Lower Extremity Assessment: Generalized weakness      Cervical / Trunk Assessment: Normal  Communication   Communication: HOH  Cognition Arousal/Alertness: Awake/alert Behavior  During Therapy: WFL for tasks assessed/performed Overall Cognitive Status: Within Functional Limits for tasks assessed                      General Comments General comments (skin integrity, edema, etc.): Pt will be able to walk with nursing to increase functional use of LE's but will need to follow up with outpatient and needs RW as of today    Exercises        Assessment/Plan    PT Assessment Patient needs continued PT services  PT Diagnosis  Abnormality of gait   PT Problem List Decreased strength;Decreased range of motion;Decreased balance;Decreased activity tolerance;Decreased mobility;Decreased coordination;Decreased safety awareness;Decreased knowledge of use of DME;Decreased knowledge of precautions;Cardiopulmonary status limiting activity;Obesity  PT Treatment Interventions DME instruction;Gait training;Stair training;Functional mobility training;Therapeutic activities;Therapeutic exercise;Balance training;Neuromuscular re-education;Patient/family education   PT Goals (Current goals can be found in the Care Plan section) Acute Rehab PT Goals Patient Stated Goal: to get back to work PT Goal Formulation: With patient Time For Goal Achievement: 04/29/15 Potential to Achieve Goals: Good    Frequency Min 3X/week   Barriers to discharge Inaccessible home environment (stairs to change floors) one rail on stairs    Co-evaluation               End of Session Equipment Utilized During Treatment: Other (comment) (IV pole) Activity Tolerance: Patient tolerated treatment well;Other (comment);Patient limited by fatigue (R side numbness) Patient left: in chair;with call bell/phone within reach Nurse Communication: Mobility status         Time: 8871-9597 PT Time Calculation (min) (ACUTE ONLY): 23 min   Charges:   PT Evaluation $Initial PT Evaluation Tier I: 1 Procedure PT Treatments $Gait Training: 8-22 mins   PT G Codes:        [name] Apr 30, 2015, 1:50 PM   [name], PT MS Acute Rehab Dept. Number: [phone] [id number] and [place] [phone]

## 2015-04-15 NOTE — Progress Notes (Signed)
PROGRESS NOTE  Jose [name] YBO:[id number] DOB: 09-Aug-1941 DOA: 04/14/2015 PCP: [name], MD  Assessment/Plan: right-sided weakness/numbness MRI of brain negative -get MRI cervical and thoracic-- with contrast-- if negative will get EMG  general anxiety disorder Continue home regimen of anxiolytics.   bradycardia Patient states is chronic. Patient states beta blocker was discontinued because of his bradycardia. Outpatient follow-up.  coronary artery disease Stable. Continue Crestor, aspirin.  Code Status: full Family Communication: patient Disposition Plan:    Consultants:  neuro  Procedures:    HPI/Subjective: Still with numbness in right leg and arm  Objective: Filed Vitals:   04/15/15 1025  BP: 131/75  Pulse: 67  Temp: 97.9 F (36.6 C)  Resp: 20    Intake/Output Summary (Last 24 hours) at 04/15/15 1422 Last data filed at 04/15/15 0600  Gross per 24 hour  Intake    850 ml  Output    620 ml  Net    230 ml   Filed Weights   04/14/15 1442  Weight: 94.711 kg (208 lb 12.8 oz)    Exam:   General:  Awake, NAD  Cardiovascular: rrr  Respiratory: clear  Abdomen: +BS, soft  Musculoskeletal: no edema   Data Reviewed: Basic Metabolic Panel:  Recent Labs Lab 04/14/15 1120 04/14/15 1129 04/15/15 0724  NA 136 138 140  K 4.4 4.4 3.8  CL 108 106 111  CO2 19*  --  24  GLUCOSE 94 96 99  BUN 8 10 8   CREATININE 0.98 1.00 0.96  CALCIUM 9.1  --  8.9   Liver Function Tests:  Recent Labs Lab 04/14/15 1120  AST 22  ALT 17  ALKPHOS 89  BILITOT 0.7  PROT 6.5  ALBUMIN 3.6   No results for input(s): LIPASE, AMYLASE in the last 168 hours. No results for input(s): AMMONIA in the last 168 hours. CBC:  Recent Labs Lab 04/14/15 1129 04/14/15 1230 04/15/15 0724  WBC  --  3.7* 3.2*  NEUTROABS  --  2.0  --   HGB 14.6 13.2 13.1  HCT 43.0 39.6 39.5  MCV  --  79.2 79.8  PLT  --  168 157   Cardiac Enzymes:  Recent Labs Lab  04/14/15 1732  TROPONINI <0.03   BNP (last 3 results) No results for input(s): BNP in the last 8760 hours.  ProBNP (last 3 results) No results for input(s): PROBNP in the last 8760 hours.  CBG: No results for input(s): GLUCAP in the last 168 hours.  Recent Results (from the past 240 hour(s))  Culture, Urine     Status: None (Preliminary result)   Collection Time: 04/14/15  6:05 PM  Result Value Ref Range Status   Specimen Description URINE, CLEAN CATCH  Final   Special Requests NONE  Final   Culture TOO YOUNG TO READ  Final   Report Status PENDING  Incomplete     Studies: Dg Chest 2 View  04/14/2015   CLINICAL DATA:  Stroke  EXAM: CHEST  2 VIEW  COMPARISON:  09/10/2004  FINDINGS: Lungs are clear.  No pleural effusion or pneumothorax.  The heart is normal in size.  Visualized osseous structures are within normal limits.  IMPRESSION: No evidence of acute cardiopulmonary disease.   Electronically Signed   By: Julian Hy M.D.   On: 04/14/2015 19:08   Ct Head Wo Contrast  04/14/2015   CLINICAL DATA:  RIGHT-sided weakness. Numbness in the hands and fingers. Onset of symptoms at 4 a.m. Initial encounter.  EXAM:  CT HEAD WITHOUT CONTRAST  TECHNIQUE: Contiguous axial images were obtained from the base of the skull through the vertex without intravenous contrast.  COMPARISON:  None.  FINDINGS: No mass lesion, mass effect, midline shift, hydrocephalus, hemorrhage. No territorial ischemia or acute infarction. Visible paranasal sinuses clear.  IMPRESSION: Negative CT head.   Electronically Signed   By: Dereck Ligas M.D.   On: 04/14/2015 12:12   Mr Brain Wo Contrast  04/14/2015   CLINICAL DATA:  Sudden onset of right-sided weakness. Tingling in the RIGHT lower extremity. Onset of symptoms at 4 a.m. earlier today. Initial encounter.  EXAM: MRI HEAD WITHOUT CONTRAST  MRA HEAD WITHOUT CONTRAST  TECHNIQUE: Multiplanar, multiecho pulse sequences of the brain and surrounding structures were  obtained without intravenous contrast. Angiographic images of the head were obtained using MRA technique without contrast.  COMPARISON:  CT head earlier today.  FINDINGS: MRI HEAD FINDINGS  No evidence for acute infarction, hemorrhage, mass lesion, hydrocephalus, or extra-axial fluid. Mild cerebral and cerebellar atrophy. Mild subcortical and periventricular T2 and FLAIR hyperintensities, likely chronic microvascular ischemic change. Pituitary, pineal, and cerebellar tonsils unremarkable. No upper cervical lesions. Flow voids are maintained throughout the carotid, basilar, and vertebral arteries. There are no areas of chronic hemorrhage. Visualized calvarium, skull base, and upper cervical osseous structures unremarkable. Scalp and extracranial soft tissues, orbits, sinuses, and mastoids show no acute process.  MRA HEAD FINDINGS  The internal carotid arteries are widely patent. The basilar artery is widely patent. LEFT vertebral is dominant. No intracranial stenosis or visible aneurysm. Fetal RIGHT PCA.  IMPRESSION: No visible intracranial flow reducing lesion.  Mild atrophy and small vessel disease. No acute intracranial findings.   Electronically Signed   By: Staci Righter M.D.   On: 04/14/2015 20:05   Mr Jodene Nam Head/brain Wo Cm  04/14/2015   CLINICAL DATA:  Sudden onset of right-sided weakness. Tingling in the RIGHT lower extremity. Onset of symptoms at 4 a.m. earlier today. Initial encounter.  EXAM: MRI HEAD WITHOUT CONTRAST  MRA HEAD WITHOUT CONTRAST  TECHNIQUE: Multiplanar, multiecho pulse sequences of the brain and surrounding structures were obtained without intravenous contrast. Angiographic images of the head were obtained using MRA technique without contrast.  COMPARISON:  CT head earlier today.  FINDINGS: MRI HEAD FINDINGS  No evidence for acute infarction, hemorrhage, mass lesion, hydrocephalus, or extra-axial fluid. Mild cerebral and cerebellar atrophy. Mild subcortical and periventricular T2 and FLAIR  hyperintensities, likely chronic microvascular ischemic change. Pituitary, pineal, and cerebellar tonsils unremarkable. No upper cervical lesions. Flow voids are maintained throughout the carotid, basilar, and vertebral arteries. There are no areas of chronic hemorrhage. Visualized calvarium, skull base, and upper cervical osseous structures unremarkable. Scalp and extracranial soft tissues, orbits, sinuses, and mastoids show no acute process.  MRA HEAD FINDINGS  The internal carotid arteries are widely patent. The basilar artery is widely patent. LEFT vertebral is dominant. No intracranial stenosis or visible aneurysm. Fetal RIGHT PCA.  IMPRESSION: No visible intracranial flow reducing lesion.  Mild atrophy and small vessel disease. No acute intracranial findings.   Electronically Signed   By: Staci Righter M.D.   On: 04/14/2015 20:05    Scheduled Meds: . aspirin  325 mg Oral Daily  . enoxaparin (LOVENOX) injection  40 mg Subcutaneous Q24H  . pantoprazole  40 mg Oral Q0600  . rosuvastatin  2.5 mg Oral q1800   Continuous Infusions:  Antibiotics Given (last 72 hours)    None      Principal Problem:  Stroke Active Problems:   Generalized anxiety disorder   Coronary atherosclerosis   Diverticulosis of large intestine   Right sided weakness   Bradycardia   CVA (cerebral infarction)    Time spent: 25 min    Imunique Samad  [facility] Pager [phone]. If 7PM-7AM, please contact night-coverage at [email or web address], password [facility] 04/15/2015, 2:22 PM  LOS: 1 day

## 2015-04-15 NOTE — Progress Notes (Signed)
*  PRELIMINARY RESULTS* Vascular Ultrasound Carotid Duplex (Doppler) has been completed. Findings suggest 1-39% internal carotid artery stenosis bilaterally. The right vertebral artery exhibits a thumpy, apparent retrograde waveform; etiology unknown. The left vertebral artery is patent with antegrade flow.  04/15/2015 9:37 AM Maudry Mayhew, RVT, RDCS, RDMS

## 2015-04-15 NOTE — Progress Notes (Signed)
Utilization Review completed. Jo Booze RN BSN CM 

## 2015-04-15 NOTE — Progress Notes (Signed)
STROKE TEAM PROGRESS NOTE   HISTORY Jose [name] is an 74 y.o. male presenting to ED due to right leg paresthesia. He states he awoke last night 04/13/2015 at 0200 hours (LKW) and felt normal. He again woke up at 0430 and and had a "achy sensation in his right arm followed by tingling in his finger tips". He went to work and around 0700 noted his whole right leg felt heavy and felt like pins and needles. Due to this he was brought to ED. Currently he continues to have tingling in the tips of his fingers on his right hand and right leg feels as if it is tingling. He states he takes ASA daily.  Modified Rankin: Rankin Score=0.  Patient was not administered TPA secondary to being out of window. He was admitted for further evaluation and treatment.   SUBJECTIVE (INTERVAL HISTORY) No family is at the bedside. Patient up in chair at bedside.  Overall he feels his condition is unchanged. He reports numbness on the right side - he demonstrated to Dr. Erlinda Hong (R stomach, R groin, R leg).    OBJECTIVE Temp:  [97.5 F (36.4 C)-98.1 F (36.7 C)] 97.9 F (36.6 C) (08/16 1025) Pulse Rate:  [43-67] 67 (08/16 1025) Cardiac Rhythm:  [-] Heart block (08/15 2017) Resp:  [12-22] 20 (08/16 1025) BP: (110-158)/(61-91) 131/75 mmHg (08/16 1025) SpO2:  [93 %-98 %] 97 % (08/16 1025) Weight:  [94.711 kg (208 lb 12.8 oz)] 94.711 kg (208 lb 12.8 oz) (08/15 1442)  No results for input(s): GLUCAP in the last 168 hours.  Recent Labs Lab 04/14/15 1120 04/14/15 1129 04/15/15 0724  NA 136 138 140  K 4.4 4.4 3.8  CL 108 106 111  CO2 19*  --  24  GLUCOSE 94 96 99  BUN 8 10 8   CREATININE 0.98 1.00 0.96  CALCIUM 9.1  --  8.9    Recent Labs Lab 04/14/15 1120  AST 22  ALT 17  ALKPHOS 89  BILITOT 0.7  PROT 6.5  ALBUMIN 3.6    Recent Labs Lab 04/14/15 1129 04/14/15 1230 04/15/15 0724  WBC  --  3.7* 3.2*  NEUTROABS  --  2.0  --   HGB 14.6 13.2 13.1  HCT 43.0 39.6 39.5  MCV  --  79.2 79.8  PLT  --   168 157    Recent Labs Lab 04/14/15 1732  TROPONINI <0.03    Recent Labs  04/14/15 1120  LABPROT 14.3  INR 1.09    Recent Labs  04/14/15 1359  COLORURINE YELLOW  LABSPEC 1.012  PHURINE 7.0  GLUCOSEU NEGATIVE  HGBUR NEGATIVE  BILIRUBINUR NEGATIVE  KETONESUR NEGATIVE  PROTEINUR NEGATIVE  UROBILINOGEN 1.0  NITRITE NEGATIVE  LEUKOCYTESUR NEGATIVE       Component Value Date/Time   CHOL 146 04/15/2015 0724   TRIG 88 04/15/2015 0724   TRIG 54 06/10/2006 1029   HDL 33* 04/15/2015 0724   CHOLHDL 4.4 04/15/2015 0724   CHOLHDL 4.4 CALC 06/10/2006 1029   VLDL 18 04/15/2015 0724   LDLCALC 95 04/15/2015 0724   Lab Results  Component Value Date   HGBA1C 6.0 03/29/2014      Component Value Date/Time   LABOPIA NONE DETECTED 04/14/2015 1359   COCAINSCRNUR NONE DETECTED 04/14/2015 1359   LABBENZ NONE DETECTED 04/14/2015 1359   AMPHETMU NONE DETECTED 04/14/2015 1359   THCU NONE DETECTED 04/14/2015 1359   LABBARB NONE DETECTED 04/14/2015 1359     Recent Labs Lab 04/14/15 1121  ETH <5   IMAGING I have personally reviewed the radiological images below and agree with the radiology interpretations.  Dg Chest 2 View 04/14/2015   No evidence of acute cardiopulmonary disease.    Ct Head Wo Contrast 04/14/2015   Negative CT head.     Mr Brain Wo Contrast 04/14/2015    Mild atrophy and small vessel disease. No acute intracranial findings.     Mr Jodene Nam Head/brain Wo Cm 04/14/2015     No visible intracranial flow reducing lesion.    CUS - 1-39% internal carotid artery stenosis bilaterally. The right vertebral artery exhibits a thumpy, apparent retrograde waveform; etiology unknown. The left vertebral artery is patent with antegrade flow.  TTE - - Normal LV size with mild LV hypertrophy. EF 55% with basal anterolateral hypokinesis. Mildly dilated RV with normal systolic function.  MRI C/T spine  MR CERVICAL SPINE IMPRESSION 1. Widespread cervical spine degeneration.  Multifactorial mild spinal stenosis from C4-C5 to C6-C7. Mild if any spinal cord mass effect and no cord signal abnormality. 2. Moderate or severe cervical foraminal stenosis at the bilateral C4, C5, C6, and C7 nerve levels. 3. 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  MR THORACIC SPINE IMPRESSION Mild for age thoracic spine degeneration. No thoracic spinal stenosis or convincing neural impingement. Normal thoracic spinal cord.  PHYSICAL EXAM Physical exam  Temp:  [97.5 F (36.4 C)-97.9 F (36.6 C)] 97.5 F (36.4 C) (08/16 2148) Pulse Rate:  [49-67] 56 (08/16 2148) Resp:  [12-22] 18 (08/16 2148) BP: (120-143)/(61-88) 132/73 mmHg (08/16 2148) SpO2:  [94 %-98 %] 96 % (08/16 2148)  General - Well nourished, well developed, in no apparent distress.  Ophthalmologic - Sharp disc margins OU.  Cardiovascular - Regular rate and rhythm with no murmur.  Mental Status -  Level of arousal and orientation to time, place, and person were intact. Language including expression, naming, repetition, comprehension was assessed and found intact. Fund of Knowledge was assessed and was intact.  Cranial Nerves II - XII - II - Visual field intact OU. III, IV, VI - Extraocular movements intact. V - Facial sensation intact bilaterally. VII - Facial movement intact bilaterally. VIII - Hearing & vestibular intact bilaterally. X - Palate elevates symmetrically. XI - Chin turning & shoulder shrug intact bilaterally. XII - Tongue protrusion intact.  Motor Strength - The patient's strength was normal in all extremities and pronator drift was absent.  Bulk was normal and fasciculations were absent.   Motor Tone - Muscle tone was assessed at the neck and appendages and was normal.  Reflexes - The patient's reflexes were 2+ in all extremities and he had right babinski positive, left  mute  Sensory - Light touch exam showed right finger tips, right thigh more medial > lateral, right foreleg both medial and lateral decreased light touch sensation, right trunk T8-10 sensory level, however, pinprick were symmetrical, vibration decreased bilateral below knee, but proprioception preserved.    Coordination - The patient had normal movements in the hands and feet with no ataxia or dysmetria.  Tremor was absent.  Gait and Station - The patient's transfers, posture, gait, station, and turns were observed slow but able to complete.   ASSESSMENT/PLAN [name] is a 74 y.o. male with history of anxiety, coronary artery disease, diverticulosis, hyperlipidemia, history of nephrolithiasis presenting with right fingertip and right LE numbness. He did not receive IV t-PA due to delay  in arrival.   R LE and trunk as well as right fingertips numbness - due to brisk reflexes and positive right babinski, need to rule out spinal cord lesion.   Resultant  Subjective R sided numbnessm, R Babinski  MRI  No acute stroke  MRA  No large vessel stenosis  Carotid checkDoppler  R VA waveform thumpy  2D Echo  unremarkable   Check MRI Cspine and thoracic spine with and without contrast showed significant DJD.  Consider OP EMG/NCS  LDL 95  HgbA1c pending  Lovenox 30 mg sq daily for VTE prophylaxis Diet Heart Room service appropriate?: Yes; Fluid consistency:: Thin  aspirin 81 mg orally every day prior to admission, now on aspirin 325 mg orally every day. Resume ASA 81  Ongoing aggressive stroke risk factor management  Therapy recommendations:  No ST  Disposition:  Return home  Right side numbness  Distribution of numbness did not fit to stroke vascular territory   Also did not fit to peripheral nerve distribution  Consider OP EMG/NCS  MRI C/T showed severe cervical DJD, no spinal cord lesion or compression  PT/OT  Consider neurontin if numbness is bothering her.    Hyperlipidemia  Home meds:  crestor 2.5 mg daily, resumed in hospital  LDL 95, goal < 70  Increase crestor to 5mg  daily  Continue statin at discharge   Other Stroke Risk Factors  Advanced age  Former Cigarette smoker, quit smoking 31 years ago   ETOH use  Coronary artery disease - MI  Other Active Problems  General anxiety disorder  bradycardia  Hospital day # 1   Neurology will sign off. Please call with questions. Pt will follow up with Dr. Erlinda Hong at [facility] in about 2 months. Thanks for the consult.  Rosalin Hawking, MD PhD Stroke Neurology 04/15/2015 11:39 PM   To contact Stroke Continuity provider, please refer to [email or web address] After hours, contact General Neurology

## 2015-04-15 NOTE — Progress Notes (Addendum)
SLP Cancellation Note  Patient Details Name: Jose [name] MRN: [id number] DOB: 04-20-1941   Cancelled treatment:       Reason Eval/Treat Not Completed: SLP screened, no needs identified, will sign off given imaging results of no acute intracranial findings.  Patient in agreement.     Gunnar Fusi, M.A., CCC-SLP [phone]  [place] 04/15/2015, 10:23 AM

## 2015-04-15 NOTE — Progress Notes (Signed)
  Echocardiogram 2D Echocardiogram has been performed.  Jose [name] 04/15/2015, 9:07 AM

## 2015-04-15 NOTE — Care Management Note (Signed)
Case Management Note  Patient Details  Name: Jose [name] MRN: [id number] Date of Birth: 12-31-1940  Subjective/Objective:                 Patient from home with wife. Independent, drives, no financial concerns shared. Patient states that he has a cane, but does not want walker at this time even if he needs one- will reapproach if PT recommends from their evaluation. Anticipate discharge to home.    Action/Plan:  Will continue to follow and offer resources, and [facility] services if needed.  Expected Discharge Date:                  Expected Discharge Plan:  [place]  In-House Referral:     Discharge planning Services  CM Consult  Post Acute Care Choice:    Choice offered to:     DME Arranged:    DME Agency:     HH Arranged:    [place] Agency:     Status of Service:  In process, will continue to follow  Medicare Important Message Given:    Date Medicare IM Given:    Medicare IM give by:    Date Additional Medicare IM Given:    Additional Medicare Important Message give by:     If discussed at [place] of Stay Meetings, dates discussed:    Additional Comments:  Carles Collet, RN 04/15/2015, 11:25 AM

## 2015-04-15 NOTE — Progress Notes (Signed)
Gave pt.the stroke early stages of recovery book.

## 2015-04-16 DIAGNOSIS — R208 Other disturbances of skin sensation: Secondary | ICD-10-CM

## 2015-04-16 DIAGNOSIS — E785 Hyperlipidemia, unspecified: Secondary | ICD-10-CM

## 2015-04-16 LAB — HEMOGLOBIN A1C
Hgb A1c MFr Bld: 6.4 % — ABNORMAL HIGH (ref 4.8–5.6)
Mean Plasma Glucose: 137 mg/dL

## 2015-04-16 LAB — URINE CULTURE: Culture: 5000

## 2015-04-16 MED ORDER — ROSUVASTATIN CALCIUM 10 MG PO TABS: 10.0000 mg | ORAL_TABLET | Freq: Every day | ORAL | Status: DC

## 2015-04-16 NOTE — Care Management Note (Signed)
Case Management Note  Patient Details  Name: Jose [name] MRN: [id number] Date of Birth: 08-07-1941  Subjective/Objective:                 Patient declined all assistance, outpatient rehab and walker.    Action/Plan:  Discharge today to home.  Expected Discharge Date:                  Expected Discharge Plan:  [place]  In-House Referral:     Discharge planning Services  CM Consult  Post Acute Care Choice:    Choice offered to:     DME Arranged:    DME Agency:     HH Arranged:    [place] Agency:     Status of Service:  Completed, signed off  Medicare Important Message Given:    Date Medicare IM Given:    Medicare IM give by:    Date Additional Medicare IM Given:    Additional Medicare Important Message give by:     If discussed at [place] of Stay Meetings, dates discussed:    Additional Comments:  Carles Collet, RN 04/16/2015, 11:00 AM

## 2015-04-16 NOTE — Progress Notes (Signed)
Physical Therapy Treatment Patient Details Name: Jose [name] MRN: [id number] DOB: 1941/01/02 Today's Date: 04/28/2015    History of Present Illness 13 yomale with onset of generalized LE weakness and R side numbness with negative MRI findings, still awaiting imaging of spine.  PMHx:  CAD, CABG, bradycardia    PT Comments    Pt doing well with mobility and no further PT needed.  Ready for dc from PT standpoint.    Follow Up Recommendations  No PT follow up     Equipment Recommendations       Recommendations for Other Services       Precautions / Restrictions Precautions Precautions: None (telemetry) Restrictions Weight Bearing Restrictions: No    Mobility  Bed Mobility               General bed mobility comments: up when PT entered  Transfers Overall transfer level: Independent   Transfers: Sit to/from Stand Sit to Stand: Independent Stand pivot transfers: Independent          Ambulation/Gait Ambulation/Gait assistance: Independent Ambulation Distance (Feet): 600 Feet Assistive device: None Gait Pattern/deviations: Step-through pattern     General Gait Details: Amb with steady gait with very slight favoring of RLE   Stairs Stairs: Yes Stairs assistance: Modified independent (Device/Increase time) Stair Management: One rail Right;Alternating pattern;Forwards Number of Stairs: 12    Wheelchair Mobility    Modified Rankin (Stroke Patients Only) Modified Rankin (Stroke Patients Only) Pre-Morbid Rankin Score: No symptoms Modified Rankin: No significant disability     Balance     Sitting balance-Leahy Scale: Good       Standing balance-Leahy Scale: Good                      Cognition Arousal/Alertness: Awake/alert Behavior During Therapy: WFL for tasks assessed/performed Overall Cognitive Status: Within Functional Limits for tasks assessed                      Exercises      General Comments         Pertinent Vitals/Pain Pain Assessment: No/denies pain    Home Living                      Prior Function            PT Goals (current goals can now be found in the care plan section) Acute Rehab PT Goals Patient Stated Goal: to get back to work PT Goal Formulation: With patient Time For Goal Achievement: 04/29/15 Potential to Achieve Goals: Good Progress towards PT goals: Goals met/education completed, patient discharged from PT    Frequency       PT Plan      Co-evaluation             End of Session   Activity Tolerance: Patient tolerated treatment well Patient left: Other (comment) (standing)     Time: 0814-4818 PT Time Calculation (min) (ACUTE ONLY): 8 min  Charges:  $Gait Training: 8-22 mins                    G Codes:      Estephani Popper 2015/04/28, 9:40 AM  [facility] PT [phone]

## 2015-04-16 NOTE — Progress Notes (Signed)
[name] to be D/C'd Home per MD order.  Discussed with the patient and all questions fully answered.  VSS, Skin clean, dry and intact without evidence of skin break down, no evidence of skin tears noted. IV catheter discontinued intact. Site without signs and symptoms of complications. Dressing and pressure applied.  An After Visit Summary was printed and given to the patient. Patient received prescription.  D/c education completed with patient/family including follow up instructions, medication list, d/c activities limitations if indicated, with other d/c instructions as indicated by MD - patient able to verbalize understanding, all questions fully answered.   Patient instructed to return to ED, call 911, or call MD for any changes in condition.   Patient escorted via [place], and D/C home via private auto.  Jose [name] F 04/16/2015 12:21 PM

## 2015-04-16 NOTE — Discharge Summary (Addendum)
Physician Discharge Summary  Jose [name] KXF:[id number] DOB: 04-01-1941 DOA: 04/14/2015  PCP: [name], MD  Admit date: 04/14/2015 Discharge date: 04/16/2015  Time spent: 35 minutes  Recommendations for Outpatient Follow-up:  1. Outpatient EMG/NCS  Discharge Diagnoses:    Generalized anxiety disorder   Coronary atherosclerosis   Diverticulosis of large intestine   Right sided weakness   Bradycardia   Right leg numbness   HLD (hyperlipidemia)   Discharge Condition: improved  Diet recommendation: cardiac  Filed Weights   04/14/15 1442  Weight: 94.711 kg (208 lb 12.8 oz)    History of present illness:  Jose [name] is a 74 y.o. male  With history of anxiety, coronary artery disease, diverticulosis, hyperlipidemia, history of nephrolithiasis who presents to the ED with sudden onset of right-sided weakness and numbness. Patient stated he went to bed around the 30 p.m. the night prior to admission and was in his usual state of health. Patient woke up around 2 AM and was still in his usual state of health. Patient woke up around 4 AM and felt a discomfort in his right upper extremity. Patient walked around however no improvement patient had his coffee had some right upper extremity weakness as well. Patient subsequently went to work and while at work in notes some tingling in his right lower extremity and his right fingertips. Patient stated he stated work for about 1-2 hours have her symptoms were worsening and subsequently presented to the ED. Patient denies any fevers, no chills, no nausea, no vomiting, no cough, no melanotic overt hematemesis, no hematochezia, no constipation, no diarrhea, no chest pain, no shortness of breath, no dysuria, no visual changes, no headaches, no facial asymmetry or weakness. Patient was seen in the ED head CT which was obtained was negative. Neurology was consulted. [facility] were called to admit the patient for further evaluation and  management.   Hospital Course:  right-sided weakness/numbness MRI of brain negative -get MRI cervical and thoracic-- with contrast--1. Widespread cervical spine degeneration. Multifactorial mild spinal stenosis from C4-C5 to C6-C7. Mild if any spinal cord mass effect and no cord signal abnormality. 2. Moderate or severe cervical foraminal stenosis at the bilateral C4, C5, C6, and C7 nerve levels. 3. 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  MR THORACIC SPINE IMPRESSION  Mild for age thoracic spine degeneration. No thoracic spinal stenosis or convincing neural impingement. Normal thoracic spinal cord. outpatient EMG  general anxiety disorder Continue home regimen of anxiolytics.  bradycardia Patient states is chronic. Patient states beta blocker was discontinued because of his bradycardia. Outpatient follow-up.  coronary artery disease Stable. Continue Crestor, aspirin  Procedures:    Consultations:  neuro  Discharge Exam: Filed Vitals:   04/16/15 0603  BP: 110/53  Pulse: 55  Temp: 98.1 F (36.7 C)  Resp: 20    General: A+Ox3, NAD- up walking stairs with PT Cardiovascular: rrr Respiratory: clear  Discharge Instructions   Discharge Instructions    Ambulatory referral to Neurology    Complete by:  As directed   Pt will follow up with Dr. Erlinda Hong at [facility] in about 2 months. Thanks.     Diet - low sodium heart healthy    Complete by:  As directed      Discharge instructions    Complete by:  As directed   Outpatient EMG          Current Discharge Medication List  CONTINUE these medications which have CHANGED   Details  rosuvastatin (CRESTOR) 10 MG tablet Take 1 tablet (10 mg total) by mouth daily at 6 PM. Qty: 30 tablet, Refills: 0      CONTINUE these medications which have NOT CHANGED   Details  aspirin 81 MG EC tablet Take 1 tablet (81  mg total) by mouth daily. Swallow whole. Qty: 30 tablet, Refills: 12    omeprazole (PRILOSEC) 20 MG capsule Take 20 mg by mouth daily.      STOP taking these medications     ALPRAZolam (XANAX) 0.25 MG tablet      tadalafil (CIALIS) 20 MG tablet        Allergies  Allergen Reactions  . Atorvastatin     REACTION: muscle weakness   Follow-up Information    Follow up with Xu,Jindong, MD. Schedule an appointment as soon as possible for a visit in 2 months.   Specialty:  Neurology   Why:  stroke clinic   Contact information:   [address] [place] [address] [phone]        The results of significant diagnostics from this hospitalization (including imaging, microbiology, ancillary and laboratory) are listed below for reference.    Significant Diagnostic Studies: Dg Chest 2 View  04/14/2015   CLINICAL DATA:  Stroke  EXAM: CHEST  2 VIEW  COMPARISON:  09/10/2004  FINDINGS: Lungs are clear.  No pleural effusion or pneumothorax.  The heart is normal in size.  Visualized osseous structures are within normal limits.  IMPRESSION: No evidence of acute cardiopulmonary disease.   Electronically Signed   By: Julian Hy M.D.   On: 04/14/2015 19:08   Ct Head Wo Contrast  04/14/2015   CLINICAL DATA:  RIGHT-sided weakness. Numbness in the hands and fingers. Onset of symptoms at 4 a.m. Initial encounter.  EXAM: CT HEAD WITHOUT CONTRAST  TECHNIQUE: Contiguous axial images were obtained from the base of the skull through the vertex without intravenous contrast.  COMPARISON:  None.  FINDINGS: No mass lesion, mass effect, midline shift, hydrocephalus, hemorrhage. No territorial ischemia or acute infarction. Visible paranasal sinuses clear.  IMPRESSION: Negative CT head.   Electronically Signed   By: Dereck Ligas M.D.   On: 04/14/2015 12:12   Mr Brain Wo Contrast  04/14/2015   CLINICAL DATA:  Sudden onset of right-sided weakness. Tingling in the RIGHT lower extremity.  Onset of symptoms at 4 a.m. earlier today. Initial encounter.  EXAM: MRI HEAD WITHOUT CONTRAST  MRA HEAD WITHOUT CONTRAST  TECHNIQUE: Multiplanar, multiecho pulse sequences of the brain and surrounding structures were obtained without intravenous contrast. Angiographic images of the head were obtained using MRA technique without contrast.  COMPARISON:  CT head earlier today.  FINDINGS: MRI HEAD FINDINGS  No evidence for acute infarction, hemorrhage, mass lesion, hydrocephalus, or extra-axial fluid. Mild cerebral and cerebellar atrophy. Mild subcortical and periventricular T2 and FLAIR hyperintensities, likely chronic microvascular ischemic change. Pituitary, pineal, and cerebellar tonsils unremarkable. No upper cervical lesions. Flow voids are maintained throughout the carotid, basilar, and vertebral arteries. There are no areas of chronic hemorrhage. Visualized calvarium, skull base, and upper cervical osseous structures unremarkable. Scalp and extracranial soft tissues, orbits, sinuses, and mastoids show no acute process.  MRA HEAD FINDINGS  The internal carotid arteries are widely patent. The basilar artery is widely patent. LEFT vertebral is dominant. No intracranial stenosis or visible aneurysm. Fetal RIGHT PCA.  IMPRESSION: No visible intracranial flow reducing lesion.  Mild atrophy and  small vessel disease. No acute intracranial findings.   Electronically Signed   By: Staci Righter M.D.   On: 04/14/2015 20:05   Mr Cervical Spine W Wo Contrast  04/15/2015   CLINICAL DATA:  74 year old male with unexplained sudden onset right upper and lower extremity weakness  EXAM: MRI THORACIC AND CERVICAL SPINE WITHOUT AND WITH CONTRAST  TECHNIQUE: Multiplanar and multiecho pulse sequences of the thoracic and cervical spine were obtained without and with intravenous contrast.  COMPARISON:  Brain MRI and intracranial MRA 04/14/2015. Head CT without contrast 04/14/2015. Chest radiographs 04/14/2015, and earlier.  FINDINGS:  MR CERVICAL SPINE FINDINGS  Mild reversal of cervical lordosis. No marrow edema or evidence of acute osseous abnormality.  Cervicomedullary junction is within normal limits. No cervical spinal cord signal abnormality. No abnormal enhancement identified.  Loss of the right vertebral artery flow void throughout the neck most suggestive of occlusion. On the comparison there appeared to be adequate reconstitution of the distal right vertebral artery. Otherwise negative paraspinal soft tissues.  The following superimposed degenerative changes are noted:  C2-C3: Mild facet hypertrophy.  C3-C4: Mild circumferential disc osteophyte complex and facet hypertrophy. No spinal stenosis. Moderate to severe C4 foraminal stenosis.  C4-C5: Disc space loss. Circumferential disc osteophyte complex. Broad-based posterior component of disc. Moderate ligament flavum hypertrophy. Spinal stenosis with mild spinal cord mass effect, no cord signal abnormality. Severe C5 foraminal stenosis.  C5-C6: Disc space loss. Circumferential disc osteophyte complex. Broad-based posterior component of disc. Spinal stenosis with mild spinal cord flattening, no cord signal abnormality. Severe C6 foraminal stenosis.  C6-C7: Disc space loss. Circumferential disc osteophyte complex. Broad-based slightly right eccentric posterior component of disc. Borderline to mild spinal stenosis, no cord mass effect. Moderate C7 foraminal stenosis.  C7-T1: Mild to moderate facet hypertrophy on the left. Mild left C8 foraminal stenosis.  MR THORACIC SPINE FINDINGS  Normal thoracic vertebral height and alignment. No marrow edema or evidence of acute osseous abnormality.  Normally patent thoracic spinal canal. No thoracic spinal stenosis. Spinal cord signal is within normal limits at all visualized levels. No abnormal intradural enhancement ; suspect physiologic anterior and posterior spinal artery enhancement in the mid to lower thoracic spine.  Negative visualized thoracic  and upper abdominal viscera. Negative visualized posterior paraspinal soft tissues.  The following superimposed degenerative changes are noted:  T1-T2:  Mild facet hypertrophy on the left, no stenosis.  T3-T4: Mild right eccentric disc bulge. Mild right T3 foraminal stenosis.  T5-T6: Subtle central disc protrusion, no stenosis.  T7-T8: Small central disc protrusion (series 14, image 20). No stenosis.  IMPRESSION: MR CERVICAL SPINE IMPRESSION  1. Widespread cervical spine degeneration. Multifactorial mild spinal stenosis from C4-C5 to C6-C7. Mild if any spinal cord mass effect and no cord signal abnormality. 2. Moderate or severe cervical foraminal stenosis at the bilateral C4, C5, C6, and C7 nerve levels. 3. 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  MR THORACIC SPINE IMPRESSION  Mild for age thoracic spine degeneration. No thoracic spinal stenosis or convincing neural impingement. Normal thoracic spinal cord.   Electronically Signed   By: Genevie Ann M.D.   On: 04/15/2015 19:29   Mr Thoracic Spine W Wo Contrast  04/15/2015   CLINICAL DATA:  74 year old male with unexplained sudden onset right upper and lower extremity weakness  EXAM: MRI THORACIC AND CERVICAL SPINE WITHOUT AND WITH CONTRAST  TECHNIQUE: Multiplanar and multiecho pulse sequences of the thoracic  and cervical spine were obtained without and with intravenous contrast.  COMPARISON:  Brain MRI and intracranial MRA 04/14/2015. Head CT without contrast 04/14/2015. Chest radiographs 04/14/2015, and earlier.  FINDINGS: MR CERVICAL SPINE FINDINGS  Mild reversal of cervical lordosis. No marrow edema or evidence of acute osseous abnormality.  Cervicomedullary junction is within normal limits. No cervical spinal cord signal abnormality. No abnormal enhancement identified.  Loss of the right vertebral artery flow void throughout the neck most suggestive of  occlusion. On the comparison there appeared to be adequate reconstitution of the distal right vertebral artery. Otherwise negative paraspinal soft tissues.  The following superimposed degenerative changes are noted:  C2-C3: Mild facet hypertrophy.  C3-C4: Mild circumferential disc osteophyte complex and facet hypertrophy. No spinal stenosis. Moderate to severe C4 foraminal stenosis.  C4-C5: Disc space loss. Circumferential disc osteophyte complex. Broad-based posterior component of disc. Moderate ligament flavum hypertrophy. Spinal stenosis with mild spinal cord mass effect, no cord signal abnormality. Severe C5 foraminal stenosis.  C5-C6: Disc space loss. Circumferential disc osteophyte complex. Broad-based posterior component of disc. Spinal stenosis with mild spinal cord flattening, no cord signal abnormality. Severe C6 foraminal stenosis.  C6-C7: Disc space loss. Circumferential disc osteophyte complex. Broad-based slightly right eccentric posterior component of disc. Borderline to mild spinal stenosis, no cord mass effect. Moderate C7 foraminal stenosis.  C7-T1: Mild to moderate facet hypertrophy on the left. Mild left C8 foraminal stenosis.  MR THORACIC SPINE FINDINGS  Normal thoracic vertebral height and alignment. No marrow edema or evidence of acute osseous abnormality.  Normally patent thoracic spinal canal. No thoracic spinal stenosis. Spinal cord signal is within normal limits at all visualized levels. No abnormal intradural enhancement ; suspect physiologic anterior and posterior spinal artery enhancement in the mid to lower thoracic spine.  Negative visualized thoracic and upper abdominal viscera. Negative visualized posterior paraspinal soft tissues.  The following superimposed degenerative changes are noted:  T1-T2:  Mild facet hypertrophy on the left, no stenosis.  T3-T4: Mild right eccentric disc bulge. Mild right T3 foraminal stenosis.  T5-T6: Subtle central disc protrusion, no stenosis.  T7-T8:  Small central disc protrusion (series 14, image 20). No stenosis.  IMPRESSION: MR CERVICAL SPINE IMPRESSION  1. Widespread cervical spine degeneration. Multifactorial mild spinal stenosis from C4-C5 to C6-C7. Mild if any spinal cord mass effect and no cord signal abnormality. 2. Moderate or severe cervical foraminal stenosis at the bilateral C4, C5, C6, and C7 nerve levels. 3. 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  MR THORACIC SPINE IMPRESSION  Mild for age thoracic spine degeneration. No thoracic spinal stenosis or convincing neural impingement. Normal thoracic spinal cord.   Electronically Signed   By: Genevie Ann M.D.   On: 04/15/2015 19:29   Mr Jodene Nam Head/brain Wo Cm  04/14/2015   CLINICAL DATA:  Sudden onset of right-sided weakness. Tingling in the RIGHT lower extremity. Onset of symptoms at 4 a.m. earlier today. Initial encounter.  EXAM: MRI HEAD WITHOUT CONTRAST  MRA HEAD WITHOUT CONTRAST  TECHNIQUE: Multiplanar, multiecho pulse sequences of the brain and surrounding structures were obtained without intravenous contrast. Angiographic images of the head were obtained using MRA technique without contrast.  COMPARISON:  CT head earlier today.  FINDINGS: MRI HEAD FINDINGS  No evidence for acute infarction, hemorrhage, mass lesion, hydrocephalus, or extra-axial fluid. Mild cerebral and cerebellar atrophy. Mild subcortical and periventricular T2 and FLAIR hyperintensities, likely chronic microvascular ischemic change. Pituitary,  pineal, and cerebellar tonsils unremarkable. No upper cervical lesions. Flow voids are maintained throughout the carotid, basilar, and vertebral arteries. There are no areas of chronic hemorrhage. Visualized calvarium, skull base, and upper cervical osseous structures unremarkable. Scalp and extracranial soft tissues, orbits, sinuses, and mastoids show no acute process.  MRA HEAD  FINDINGS  The internal carotid arteries are widely patent. The basilar artery is widely patent. LEFT vertebral is dominant. No intracranial stenosis or visible aneurysm. Fetal RIGHT PCA.  IMPRESSION: No visible intracranial flow reducing lesion.  Mild atrophy and small vessel disease. No acute intracranial findings.   Electronically Signed   By: Staci Righter M.D.   On: 04/14/2015 20:05    Microbiology: Recent Results (from the past 240 hour(s))  Culture, Urine     Status: None (Preliminary result)   Collection Time: 04/14/15  6:05 PM  Result Value Ref Range Status   Specimen Description URINE, CLEAN CATCH  Final   Special Requests NONE  Final   Culture TOO YOUNG TO READ  Final   Report Status PENDING  Incomplete     Labs: Basic Metabolic Panel:  Recent Labs Lab 04/14/15 1120 04/14/15 1129 04/15/15 0724  NA 136 138 140  K 4.4 4.4 3.8  CL 108 106 111  CO2 19*  --  24  GLUCOSE 94 96 99  BUN 8 10 8   CREATININE 0.98 1.00 0.96  CALCIUM 9.1  --  8.9   Liver Function Tests:  Recent Labs Lab 04/14/15 1120  AST 22  ALT 17  ALKPHOS 89  BILITOT 0.7  PROT 6.5  ALBUMIN 3.6   No results for input(s): LIPASE, AMYLASE in the last 168 hours. No results for input(s): AMMONIA in the last 168 hours. CBC:  Recent Labs Lab 04/14/15 1129 04/14/15 1230 04/15/15 0724  WBC  --  3.7* 3.2*  NEUTROABS  --  2.0  --   HGB 14.6 13.2 13.1  HCT 43.0 39.6 39.5  MCV  --  79.2 79.8  PLT  --  168 157   Cardiac Enzymes:  Recent Labs Lab 04/14/15 1732  TROPONINI <0.03   BNP: BNP (last 3 results) No results for input(s): BNP in the last 8760 hours.  ProBNP (last 3 results) No results for input(s): PROBNP in the last 8760 hours.  CBG: No results for input(s): GLUCAP in the last 168 hours.     SignedEulogio Bear  [facility] 04/16/2015, 9:41 AM

## 2015-04-17 ENCOUNTER — Telehealth: Payer: Self-pay | Admitting: *Deleted

## 2015-04-17 NOTE — Telephone Encounter (Signed)
Left message on machine for patient to return our call.  Transitional care management. 

## 2015-04-18 NOTE — Telephone Encounter (Signed)
Left message on machine for patient to call back. Transitional Care Management.

## 2015-04-24 NOTE — Telephone Encounter (Signed)
Last and final attempt to reach patient - Left message on machine for patient to return our call.  Transitional Care Mangement.

## 2015-05-02 DIAGNOSIS — M9903 Segmental and somatic dysfunction of lumbar region: Secondary | ICD-10-CM | POA: Diagnosis not present

## 2015-05-02 DIAGNOSIS — M5417 Radiculopathy, lumbosacral region: Secondary | ICD-10-CM | POA: Diagnosis not present

## 2015-05-02 DIAGNOSIS — M9905 Segmental and somatic dysfunction of pelvic region: Secondary | ICD-10-CM | POA: Diagnosis not present

## 2015-05-02 DIAGNOSIS — M9902 Segmental and somatic dysfunction of thoracic region: Secondary | ICD-10-CM | POA: Diagnosis not present

## 2015-05-02 DIAGNOSIS — M5414 Radiculopathy, thoracic region: Secondary | ICD-10-CM | POA: Diagnosis not present

## 2015-05-02 DIAGNOSIS — M5137 Other intervertebral disc degeneration, lumbosacral region: Secondary | ICD-10-CM | POA: Diagnosis not present

## 2015-06-03 ENCOUNTER — Ambulatory Visit: Payer: Medicare Other | Admitting: Family Medicine

## 2015-06-05 ENCOUNTER — Ambulatory Visit: Payer: Medicare Other | Admitting: Family Medicine

## 2015-06-10 ENCOUNTER — Ambulatory Visit (INDEPENDENT_AMBULATORY_CARE_PROVIDER_SITE_OTHER): Payer: Medicare Other | Admitting: Family Medicine

## 2015-06-10 ENCOUNTER — Encounter: Payer: Self-pay | Admitting: Family Medicine

## 2015-06-10 VITALS — BP 140/80 | HR 62 | Temp 98.3°F | Wt 214.0 lb

## 2015-06-10 DIAGNOSIS — R200 Anesthesia of skin: Secondary | ICD-10-CM

## 2015-06-10 DIAGNOSIS — E785 Hyperlipidemia, unspecified: Secondary | ICD-10-CM | POA: Diagnosis not present

## 2015-06-10 DIAGNOSIS — R208 Other disturbances of skin sensation: Secondary | ICD-10-CM | POA: Diagnosis not present

## 2015-06-10 MED ORDER — ALPRAZOLAM 0.25 MG PO TABS: ORAL_TABLET | ORAL | Status: DC

## 2015-06-10 MED ORDER — TADALAFIL 20 MG PO TABS: 10.0000 mg | ORAL_TABLET | Freq: Every day | ORAL | Status: DC | PRN

## 2015-06-10 NOTE — Progress Notes (Signed)
[name], MD  Subjective:  Jose [name] is a 74 y.o. year old very pleasant male patient who presents for/with See problem oriented charting ROS- no chest pain, shortness of breath, further rectal bleeding since February. No melena  Past Medical History-  Patient Active Problem List   Diagnosis Date Noted  . Coronary atherosclerosis 02/09/2007    Priority: High  . Generalized anxiety disorder 08/22/2008    Priority: Medium  . Hyperlipidemia 02/09/2007    Priority: Medium  . Barrett's esophagus 02/09/2007    Priority: Medium  . Actinic keratosis 08/07/2014    Priority: Low  . Elevated blood pressure 03/29/2014    Priority: Low  . Hyperglycemia 02/26/2013    Priority: Low  . OBESITY 03/20/2010    Priority: Low  . ERECTILE DYSFUNCTION 08/22/2008    Priority: Low  . Diverticulosis of large intestine 02/09/2007    Priority: Low  . Right leg numbness   . Bradycardia 04/14/2015  . Acute blood loss anemia 10/17/2014  . Diverticulosis of colon with hemorrhage   . GIB (gastrointestinal bleeding) 10/15/2014    Medications- reviewed and updated Current Outpatient Prescriptions  Medication Sig Dispense Refill  . ALPRAZolam (XANAX) 0.25 MG tablet TAKE 1 TABLET BY MOUTH EVERY DAY (MAX 3 DOSES PER WEEK) 30 tablet 1  . aspirin 81 MG EC tablet Take 1 tablet (81 mg total) by mouth daily. Swallow whole. 30 tablet 12  . omeprazole (PRILOSEC) 20 MG capsule Take 20 mg by mouth daily.    . rosuvastatin (CRESTOR) 10 MG tablet Take 1 tablet (10 mg total) by mouth daily at 6 PM. 30 tablet 0  . tadalafil (CIALIS) 20 MG tablet Take 0.5 tablets (10 mg total) by mouth daily as needed for erectile dysfunction. 8 tablet 5   No current facility-administered medications for this visit.    Objective: BP 140/80 mmHg  Pulse 62  Temp(Src) 98.3 F (36.8 C)  Wt 214 lb (97.07 kg) Gen: NAD, resting comfortably No mucus membrane pallor CV: RRR no murmurs rubs or gallops Lungs: CTAB no  crackles, wheeze, rhonchi Abdomen: soft/nontender/nondistended/normal bowel sounds. No rebound or guarding.  Ext: no edema Skin: warm, dry Neuro: 5/5 strength bilateral upper and lower extremities, equal reflexes. Subjective decreased touch even to gross touch R compared to left.   Assessment/Plan:  Right leg numbness S:R sided tingling in August. Woke up in middle of night and had  tingling in right hand. Never had issue before. Then later whole right side became numb/tingling. He reported to ED concerned about stroke.   MRI brain negative for cause- CVA ruled out. TIA ruled out given persistence.  Patient with MRI cervical spine showing: 1. Widespread cervical spine degeneration. Multifactorial mild spinal stenosis from C4-C5 to C6-C7. Mild if any spinal cord mass effect and no cord signal abnormality. 2. Moderate or severe cervical foraminal stenosis at the bilateral C4, C5, C6, and C7 nerve levels. 3. 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  MR THORACIC SPINE IMPRESSION  Mild for age thoracic spine degeneration. No thoracic spinal stenosis or convincing neural impingement. Normal thoracic spinal Cord.  Plan was for EMG on outpatient basis through Dr. Erlinda Hong who he sees next week  A/P: He fiercely denies ever having weakness and I cannot find weakness on exam though continues to report numbness/tingling on right side worse in hand and around knee. Cause is unclear- MRI brain without cause and MRI neck  and thoracic spine with some degenerative changes but unclear cause.   Patient would like to cancel visit with neurology. He saw a chiropractor and prefers to do that instead. Does not want "muscle biopsy" or EMG. I tried to explain to him what an EMG is and that neurology would walk him through this as well. I encouraged him to keep visit and hold off on chiropractor 12  advised sessions until after that visit. He eventually agreed to this- unclear what his apprehension is.    He also had a GI bleed in February likely due to diverticulosis. Held asirin for a week with this.  With CAD history had to restart. No further rectal bleeding. Refilled xanax which he uses 3x a week or less for GAD. Refilled Cialis with no chest pain or shortness of breath  Advised 1 month follow up for BP but patient states he is following with cardiology and will see them within a month  BP Readings from Last 3 Encounters:  06/10/15 140/80  04/16/15 122/69  10/18/14 131/70   Return precautions advised.   Future fasting labs before AWV- next available, also will follow up after GI bleed Orders Placed This Encounter  Procedures  . CBC    [place]    Standing Status: Future     Number of Occurrences:      Standing Expiration Date: 06/09/2016  . Comprehensive metabolic panel    [place]    Standing Status: Future     Number of Occurrences:      Standing Expiration Date: 06/09/2016    Order Specific Question:  Has the patient fasted?    Answer:  No  . Lipid panel    [place]    Standing Status: Future     Number of Occurrences:      Standing Expiration Date: 06/09/2016    Order Specific Question:  Has the patient fasted?    Answer:  No  . TSH    [place]    Standing Status: Future     Number of Occurrences:      Standing Expiration Date: 06/09/2016    Meds ordered this encounter  Medications  . tadalafil (CIALIS) 20 MG tablet    Sig: Take 0.5 tablets (10 mg total) by mouth daily as needed for erectile dysfunction.    Dispense:  8 tablet    Refill:  5  . ALPRAZolam (XANAX) 0.25 MG tablet    Sig: TAKE 1 TABLET BY MOUTH EVERY DAY (MAX 3 DOSES PER WEEK)    Dispense:  30 tablet    Refill:  1   >50% of 30 minute office visit was spent on counseling (advising patient to follow through with neurology, addressing concerns and questions about chiropractic care, counseling  on GAD management)  and coordination of care

## 2015-06-10 NOTE — Assessment & Plan Note (Addendum)
S:R sided tingling in August. Woke up in middle of night and had  tingling in right hand. Never had issue before. Then later whole right side became numb/tingling. He reported to ED concerned about stroke.   MRI brain negative for cause- CVA ruled out. TIA ruled out given persistence.  Patient with MRI cervical spine showing: 1. Widespread cervical spine degeneration. Multifactorial mild spinal stenosis from C4-C5 to C6-C7. Mild if any spinal cord mass effect and no cord signal abnormality. 2. Moderate or severe cervical foraminal stenosis at the bilateral C4, C5, C6, and C7 nerve levels. 3. 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  MR THORACIC SPINE IMPRESSION  Mild for age thoracic spine degeneration. No thoracic spinal stenosis or convincing neural impingement. Normal thoracic spinal Cord.  Plan was for EMG on outpatient basis through Dr. Erlinda Hong who he sees next week  A/P: He fiercely denies ever having weakness and I cannot find weakness on exam though continues to report numbness/tingling on right side worse in hand and around knee. Cause is unclear- MRI brain without cause and MRI neck and thoracic spine with some degenerative changes but unclear cause.   Patient would like to cancel visit with neurology. He saw a chiropractor and prefers to do that instead. Does not want "muscle biopsy" or EMG. I tried to explain to him what an EMG is and that neurology would walk him through this as well. I encouraged him to keep visit and hold off on chiropractor 12 advised sessions until after that visit. He eventually agreed to this- unclear what his apprehension is.

## 2015-06-10 NOTE — Patient Instructions (Addendum)
Will get PREVNAR and flu shot at [facility] visit. Labs week prior to visit. Next available  I recommend you see me back in a month to follow up your blood pressure, it is above goal for someone with heart disease BP 140/80 mmHg  Pulse 62  Temp(Src) 98.3 F (36.8 C)  Wt 214 lb (97.07 kg)  We would need to consider medicine if remains high  I recommend you keep your neurology visit. Ask their opinion about chiropractor before going  Refilled xanax and cialis

## 2015-06-16 ENCOUNTER — Ambulatory Visit (INDEPENDENT_AMBULATORY_CARE_PROVIDER_SITE_OTHER): Payer: Medicare Other | Admitting: Neurology

## 2015-06-16 ENCOUNTER — Encounter: Payer: Self-pay | Admitting: Neurology

## 2015-06-16 VITALS — BP 136/87 | HR 49 | Ht 70.0 in | Wt 214.6 lb

## 2015-06-16 DIAGNOSIS — R200 Anesthesia of skin: Secondary | ICD-10-CM

## 2015-06-16 DIAGNOSIS — M5417 Radiculopathy, lumbosacral region: Secondary | ICD-10-CM

## 2015-06-16 DIAGNOSIS — E785 Hyperlipidemia, unspecified: Secondary | ICD-10-CM | POA: Diagnosis not present

## 2015-06-16 DIAGNOSIS — I639 Cerebral infarction, unspecified: Secondary | ICD-10-CM | POA: Diagnosis not present

## 2015-06-16 NOTE — Patient Instructions (Addendum)
-   will schedule nerve conduction study to 06/23/15 4pm.  - continue ASA and statin for stroke prevention - Follow up with your primary care physician for stroke risk factor modification. Recommend maintain blood pressure goal <140/90, diabetes with hemoglobin A1c goal below 6.5% and lipids with LDL cholesterol goal below 70 mg/dL.  - OK to have chiropractor at the back, but be gentle. - follow up in 3 months

## 2015-06-17 DIAGNOSIS — M5417 Radiculopathy, lumbosacral region: Secondary | ICD-10-CM | POA: Insufficient documentation

## 2015-06-17 DIAGNOSIS — E785 Hyperlipidemia, unspecified: Secondary | ICD-10-CM | POA: Insufficient documentation

## 2015-06-17 DIAGNOSIS — R200 Anesthesia of skin: Secondary | ICD-10-CM | POA: Insufficient documentation

## 2015-06-17 NOTE — Progress Notes (Signed)
STROKE NEUROLOGY FOLLOW UP NOTE  NAME: Jose [name] DOB: 01-02-1941  REASON FOR VISIT: stroke follow up HISTORY FROM: pt and chart  Today we had the pleasure of seeing Jose [name] in follow-up at our Neurology Clinic. Pt was accompanied by no one.   History Summary Jose [name] is a 74 y.o. male with history of anxiety, CAD, diverticulosis, hyperlipidemia, history of nephrolithiasis was admitted for right fingertip and right LE numbness on 04/15/15. MRI and MRA negative for acute stroke or large vessel occulsion. CUS showed right VA thumpy waveform, and 2D echo negative. His numbness pattern did not fit to vascular territory or peripheral nerve pattern, had MRI C- and T-spine showed no spinal cord compression but significant DJD, but right VA occlusion. He was discharged with ASA and crestor, and also outpt EMG.    Interval History During the interval time, the patient has been doing well. His numbness much better. Still has tingling at right hand fingertip and right thigh and forleg around the knee. No more tingling at medial thigh and genital area, and no more sensory level. Sometimes, he will have right side LBP on heavy lifting with right hand. He has not get called for EMG yet.   REVIEW OF SYSTEMS: Full 14 system review of systems performed and notable only for those listed below and in HPI above, all others are negative:  Constitutional:   Cardiovascular:  Ear/Nose/Throat:   Skin:  Eyes:   Respiratory:   Gastroitestinal:   Genitourinary:  Hematology/Lymphatic:   Endocrine:  Musculoskeletal:   Allergy/Immunology:   Neurological:  numbness Psychiatric:  Sleep:   The following represents the patient's updated allergies and side effects list: Allergies  Allergen Reactions  . Atorvastatin     REACTION: muscle weakness    The neurologically relevant items on the patient's problem list were reviewed on today's visit.  Neurologic Examination  A problem  focused neurological exam (12 or more points of the single system neurologic examination, vital signs counts as 1 point, cranial nerves count for 8 points) was performed.  Blood pressure 136/87, pulse 49, height 5\' 10"  (1.778 m), weight 214 lb 9.6 oz (97.342 kg).  General - Well nourished, well developed, in no apparent distress.  Ophthalmologic - Sharp disc margins OU.   Cardiovascular - Regular rate and rhythm with no murmur.  Mental Status -  Level of arousal and orientation to time, place, and person were intact. Language including expression, naming, repetition, comprehension was assessed and found intact. Fund of Knowledge was assessed and was intact.  Cranial Nerves II - XII - II - Visual field intact OU. III, IV, VI - Extraocular movements intact. V - Facial sensation intact bilaterally. VII - Facial movement intact bilaterally. VIII - Hearing & vestibular intact bilaterally. X - Palate elevates symmetrically. XI - Chin turning & shoulder shrug intact bilaterally. XII - Tongue protrusion intact.  Motor Strength - The patient's strength was normal in all extremities and pronator drift was absent.  Bulk was normal and fasciculations were absent.   Motor Tone - Muscle tone was assessed at the neck and appendages and was normal.  Reflexes - The patient's reflexes were 1+ in all extremities and he had no pathological reflexes.  Sensory - Light touch exam showed right finger tips, right lateral thigh, right foreleg both medial and lateral decreased light touch sensation, however, pinprick were symmetrical, vibration decreased bilateral below knee, but proprioception preserved.   Coordination - The patient had normal  movements in the hands and feet with no ataxia or dysmetria.  Tremor was absent.  Gait and Station - The patient's transfers, posture, gait, station, and turns were observed as normal.  Data reviewed: I personally reviewed the images and agree with the radiology  interpretations.  Dg Chest 2 View 04/14/2015 No evidence of acute cardiopulmonary disease.  Ct Head Wo Contrast 04/14/2015 Negative CT head.   Mr Brain Wo Contrast 04/14/2015 Mild atrophy and small vessel disease. No acute intracranial findings.   Mr Jodene Nam Head/brain Wo Cm 04/14/2015 No visible intracranial flow reducing lesion.   CUS - 1-39% internal carotid artery stenosis bilaterally. The right vertebral artery exhibits a thumpy, apparent retrograde waveform; etiology unknown. The left vertebral artery is patent with antegrade flow.  TTE - - Normal LV size with mild LV hypertrophy. EF 55% with basal anterolateral hypokinesis. Mildly dilated RV with normal systolic function.  MR CERVICAL SPINE 1. Widespread cervical spine degeneration. Multifactorial mild spinal stenosis from C4-C5 to C6-C7. Mild if any spinal cord mass effect and no cord signal abnormality. 2. Moderate or severe cervical foraminal stenosis at the bilateral C4, C5, C6, and C7 nerve levels. 3. 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  MR THORACIC SPINE  Mild for age thoracic spine degeneration. No thoracic spinal stenosis or convincing neural impingement. Normal thoracic spinal Cord.  Component     Latest Ref Rng 04/15/2015  Cholesterol     0 - 200 mg/dL 146  Triglycerides     <150 mg/dL 88  HDL Cholesterol     >40 mg/dL 33 (L)  Total CHOL/HDL Ratio      4.4  VLDL     0 - 40 mg/dL 18  LDL (calc)     0 - 99 mg/dL 95  Hemoglobin A1C     4.8 - 5.6 % 6.4 (H)  Mean Plasma Glucose      137    Assessment: As you may recall, he is a 74 y.o. Caucasian male with PMH of anxiety, CAD, diverticulosis, hyperlipidemia, history of nephrolithiasis was admitted for right fingertip and right LE numbness on 04/15/15. Stroke work up showed no acute infarct, only right VA chronic occlusion.  His numbness pattern did not fit to vascular territory or peripheral nerve pattern, had MRI C- and T-spine showed no spinal cord compression but significant DJD. Consider radiculopathy. He was discharged with ASA and crestor. During the interval time, numbness much better but still pending outpt EMG. He had intermittent right LBP with heavy lifting.  Plan:  - will schedule EMG/NCS 06/23/15 4pm.  - continue ASA and statin for stroke prevention - Follow up with your primary care physician for stroke risk factor modification. Recommend maintain blood pressure goal <140/90, diabetes with hemoglobin A1c goal below 6.5% and lipids with LDL cholesterol goal below 70 mg/dL.  - OK to have chiropractor at the back, but be gentle. - follow up in 3 months  I spent more than 25 minutes of face to face time with the patient. Greater than 50% of time was spent in counseling and coordination of care.    No orders of the defined types were placed in this encounter.    Meds ordered this encounter  Medications  . HYDROcodone-acetaminophen (NORCO) 10-325 MG tablet    Sig: TAKE 1 TABLET BY MOUTH EVERY 3-4 HOURS AS NEEDED FOR PAIN    Refill:  0    Patient Instructions  -  will schedule nerve conduction study to 06/23/15 4pm.  - continue ASA and statin for stroke prevention - Follow up with your primary care physician for stroke risk factor modification. Recommend maintain blood pressure goal <140/90, diabetes with hemoglobin A1c goal below 6.5% and lipids with LDL cholesterol goal below 70 mg/dL.  - OK to have chiropractor at the back, but be gentle. - follow up in 3 months   Rosalin Hawking, MD PhD [facility] [address], [place], [place] [address] [phone]

## 2015-06-23 ENCOUNTER — Encounter (INDEPENDENT_AMBULATORY_CARE_PROVIDER_SITE_OTHER): Payer: Self-pay

## 2015-06-23 ENCOUNTER — Encounter: Payer: Medicare Other | Admitting: Neurology

## 2015-06-23 ENCOUNTER — Ambulatory Visit (INDEPENDENT_AMBULATORY_CARE_PROVIDER_SITE_OTHER): Payer: Medicare Other | Admitting: Neurology

## 2015-06-23 DIAGNOSIS — R200 Anesthesia of skin: Secondary | ICD-10-CM

## 2015-06-23 DIAGNOSIS — R208 Other disturbances of skin sensation: Secondary | ICD-10-CM | POA: Diagnosis not present

## 2015-06-23 DIAGNOSIS — Z0289 Encounter for other administrative examinations: Secondary | ICD-10-CM

## 2015-06-23 NOTE — Procedures (Signed)
     HISTORY: Jose [name] is a 74 year old gentleman with onset of right arm numbness and weakness and right leg numbness that began in mid August 2016, with some gradual improvement since this time. The patient is being evaluated for his current symptoms. Stroke workup was unremarkable.  NERVE CONDUCTION STUDIES:  Nerve conduction studies were performed on the right upper extremity. The distal motor latencies and motor amplitudes for the median and ulnar nerves were within normal limits. The F wave latencies and nerve conduction velocities for these nerves were also normal. The sensory latencies for the median and ulnar nerves were normal.  Nerve conduction studies were performed on the right lower extremity. The distal motor latencies and motor amplitudes for the peroneal and posterior tibial nerves were within normal limits. The nerve conduction velocities for these nerves were also normal. The H reflex latency was normal. The sensory latency for the peroneal nerve was within normal limits.   EMG STUDIES:  EMG study was performed on the right upper extremity:  The first dorsal interosseous muscle reveals 2 to 4 K units with full recruitment. No fibrillations or positive waves were noted. The abductor pollicis brevis muscle reveals 2 to 4 K units with full recruitment. No fibrillations or positive waves were noted. The extensor indicis proprius muscle reveals 1 to 3 K units with full recruitment. No fibrillations or positive waves were noted. The pronator teres muscle reveals 2 to 3 K units with full recruitment. No fibrillations or positive waves were noted. The biceps muscle reveals 1 to 2 K units with full recruitment. No fibrillations or positive waves were noted. The triceps muscle reveals 2 to 4 K units with full recruitment. No fibrillations or positive waves were noted. The anterior deltoid muscle reveals 2 to 3 K units with full recruitment. No fibrillations or positive waves  were noted. The cervical paraspinal muscles were tested at 2 levels. No abnormalities of insertional activity were seen at either level tested. There was good relaxation.  EMG study was performed on the right lower extremity:  The tibialis anterior muscle reveals 2 to 4K motor units with full recruitment. No fibrillations or positive waves were seen. The peroneus tertius muscle reveals 2 to 4K motor units with full recruitment. No fibrillations or positive waves were seen. The medial gastrocnemius muscle reveals 1 to 3K motor units with full recruitment. No fibrillations or positive waves were seen. The vastus lateralis muscle reveals 2 to 4K motor units with full recruitment. No fibrillations or positive waves were seen. The iliopsoas muscle reveals 2 to 4K motor units with full recruitment. No fibrillations or positive waves were seen. The biceps femoris muscle (long head) reveals 2 to 4K motor units with full recruitment. No fibrillations or positive waves were seen. The lumbosacral paraspinal muscles were tested at 3 levels, and revealed no abnormalities of insertional activity at all 3 levels tested. There was good relaxation.   IMPRESSION:  Nerve conduction studies done on the right upper and right lower extremities were unremarkable, without evidence of a neuropathy. EMG evaluation of the right upper and right lower extremities were unremarkable, without evidence of an overlying cervical or lumbar radiculopathy.  Jill Alexanders MD 06/23/2015 5:50 PM  [facility] [address] [place], [place] [address]  Phone [phone] Fax [phone]

## 2015-06-25 ENCOUNTER — Encounter: Payer: Self-pay | Admitting: Gastroenterology

## 2015-06-30 ENCOUNTER — Telehealth: Payer: Self-pay

## 2015-06-30 NOTE — Telephone Encounter (Signed)
Called pt back to the number listed [phone] but no one available. Left message for him to call back.  Rosalin Hawking, MD PhD Stroke Neurology 06/30/2015 10:03 AM

## 2015-06-30 NOTE — Telephone Encounter (Signed)
Rn call patient back about his nerve conduction test, also to notify him that the study was unremarkable, and no abnormal areas were seen.Also to continue treatment plan, and medications. Pt stated he wanted to ask Dr.Xu about some possible medications for his numbness in his right hand and right leg. Pt stated the numbness is not chronic. Rn stated Dr. Erlinda Hong will be notified of some options for him.

## 2015-07-10 DIAGNOSIS — H25813 Combined forms of age-related cataract, bilateral: Secondary | ICD-10-CM | POA: Diagnosis not present

## 2015-07-10 DIAGNOSIS — D3132 Benign neoplasm of left choroid: Secondary | ICD-10-CM | POA: Diagnosis not present

## 2015-07-10 DIAGNOSIS — H02834 Dermatochalasis of left upper eyelid: Secondary | ICD-10-CM | POA: Diagnosis not present

## 2015-07-10 DIAGNOSIS — H02831 Dermatochalasis of right upper eyelid: Secondary | ICD-10-CM | POA: Diagnosis not present

## 2015-08-04 ENCOUNTER — Telehealth: Payer: Self-pay | Admitting: Gastroenterology

## 2015-08-04 NOTE — Telephone Encounter (Signed)
Left message for patient to call back  

## 2015-08-07 NOTE — Telephone Encounter (Signed)
No return call from patient.  I will await a return call 

## 2015-08-11 DIAGNOSIS — A09 Infectious gastroenteritis and colitis, unspecified: Secondary | ICD-10-CM | POA: Diagnosis not present

## 2015-08-30 DIAGNOSIS — Z23 Encounter for immunization: Secondary | ICD-10-CM | POA: Diagnosis not present

## 2015-09-09 ENCOUNTER — Other Ambulatory Visit: Payer: Self-pay | Admitting: Family Medicine

## 2015-11-13 DIAGNOSIS — H25813 Combined forms of age-related cataract, bilateral: Secondary | ICD-10-CM | POA: Diagnosis not present

## 2015-11-24 DIAGNOSIS — H25811 Combined forms of age-related cataract, right eye: Secondary | ICD-10-CM | POA: Diagnosis not present

## 2015-11-24 DIAGNOSIS — H2511 Age-related nuclear cataract, right eye: Secondary | ICD-10-CM | POA: Diagnosis not present

## 2015-12-02 DIAGNOSIS — H2512 Age-related nuclear cataract, left eye: Secondary | ICD-10-CM | POA: Diagnosis not present

## 2015-12-08 DIAGNOSIS — H2512 Age-related nuclear cataract, left eye: Secondary | ICD-10-CM | POA: Diagnosis not present

## 2015-12-08 DIAGNOSIS — H25812 Combined forms of age-related cataract, left eye: Secondary | ICD-10-CM | POA: Diagnosis not present

## 2015-12-16 DIAGNOSIS — E782 Mixed hyperlipidemia: Secondary | ICD-10-CM | POA: Diagnosis not present

## 2015-12-16 DIAGNOSIS — I25119 Atherosclerotic heart disease of native coronary artery with unspecified angina pectoris: Secondary | ICD-10-CM | POA: Diagnosis not present

## 2015-12-16 DIAGNOSIS — I10 Essential (primary) hypertension: Secondary | ICD-10-CM | POA: Diagnosis not present

## 2016-01-27 DIAGNOSIS — R001 Bradycardia, unspecified: Secondary | ICD-10-CM | POA: Diagnosis not present

## 2016-01-27 DIAGNOSIS — I10 Essential (primary) hypertension: Secondary | ICD-10-CM | POA: Diagnosis not present

## 2016-01-27 DIAGNOSIS — E782 Mixed hyperlipidemia: Secondary | ICD-10-CM | POA: Diagnosis not present

## 2016-01-27 DIAGNOSIS — I25119 Atherosclerotic heart disease of native coronary artery with unspecified angina pectoris: Secondary | ICD-10-CM | POA: Diagnosis not present

## 2016-02-20 DIAGNOSIS — M1712 Unilateral primary osteoarthritis, left knee: Secondary | ICD-10-CM | POA: Diagnosis not present

## 2016-04-26 ENCOUNTER — Other Ambulatory Visit: Payer: Self-pay | Admitting: Family Medicine

## 2016-04-26 NOTE — Telephone Encounter (Signed)
Rx refill sent to pharmacy. 

## 2016-05-07 ENCOUNTER — Ambulatory Visit: Payer: Medicare Other | Admitting: Family Medicine

## 2016-05-27 ENCOUNTER — Ambulatory Visit (INDEPENDENT_AMBULATORY_CARE_PROVIDER_SITE_OTHER): Payer: Medicare Other | Admitting: Family Medicine

## 2016-05-27 ENCOUNTER — Encounter: Payer: Self-pay | Admitting: Family Medicine

## 2016-05-27 VITALS — BP 124/80 | HR 90 | Temp 98.0°F | Wt 211.2 lb

## 2016-05-27 DIAGNOSIS — E785 Hyperlipidemia, unspecified: Secondary | ICD-10-CM

## 2016-05-27 DIAGNOSIS — R739 Hyperglycemia, unspecified: Secondary | ICD-10-CM | POA: Diagnosis not present

## 2016-05-27 DIAGNOSIS — F411 Generalized anxiety disorder: Secondary | ICD-10-CM | POA: Diagnosis not present

## 2016-05-27 DIAGNOSIS — R351 Nocturia: Secondary | ICD-10-CM

## 2016-05-27 DIAGNOSIS — N401 Enlarged prostate with lower urinary tract symptoms: Secondary | ICD-10-CM | POA: Insufficient documentation

## 2016-05-27 DIAGNOSIS — I2510 Atherosclerotic heart disease of native coronary artery without angina pectoris: Secondary | ICD-10-CM

## 2016-05-27 MED ORDER — ALPRAZOLAM 0.25 MG PO TABS: ORAL_TABLET | ORAL | 1 refills | Status: DC

## 2016-05-27 NOTE — Assessment & Plan Note (Signed)
S: has lost a few lbs from last year but needs further weight loss Wt Readings from Last 3 Encounters:  05/27/16 211 lb 3.2 oz (95.8 kg)  06/16/15 214 lb 9.6 oz (97.3 kg)  06/10/15 214 lb (97.1 kg)  A/P: update a1c at physical- consider metformin if above 6

## 2016-05-27 NOTE — Progress Notes (Signed)
Subjective:  Jose [name] is a 75 y.o. year old very pleasant male patient who presents for/with See problem oriented charting ROS- No chest pain or shortness of breath. No headache or blurry vision- doing better since cataract surgery.see any ROS included in HPI as well.   Past Medical History-  Patient Active Problem List   Diagnosis Date Noted  . Coronary atherosclerosis 02/09/2007    Priority: High  . Generalized anxiety disorder 08/22/2008    Priority: Medium  . Hyperlipidemia 02/09/2007    Priority: Medium  . Barrett's esophagus 02/09/2007    Priority: Medium  . Actinic keratosis 08/07/2014    Priority: Low  . Elevated blood pressure 03/29/2014    Priority: Low  . Hyperglycemia 02/26/2013    Priority: Low  . OBESITY 03/20/2010    Priority: Low  . ERECTILE DYSFUNCTION 08/22/2008    Priority: Low  . Diverticulosis of large intestine 02/09/2007    Priority: Low  . Benign prostatic hypertrophy (BPH) with nocturia 05/27/2016  . Lumbosacral radiculopathy 06/17/2015  . HLD (hyperlipidemia) 06/17/2015  . Numbness 06/17/2015  . Right leg numbness   . Bradycardia 04/14/2015  . Acute blood loss anemia 10/17/2014  . Diverticulosis of colon with hemorrhage   . GIB (gastrointestinal bleeding) 10/15/2014    Medications- reviewed and updated Current Outpatient Prescriptions  Medication Sig Dispense Refill  . ALPRAZolam (XANAX) 0.25 MG tablet TAKE 1 TABLET BY MOUTH EVERY DAY (MAX 3 DOSES PER WEEK) 30 tablet 1  . aspirin 81 MG EC tablet Take 1 tablet (81 mg total) by mouth daily. Swallow whole. 30 tablet 12  . CIALIS 20 MG tablet TAKE 0.5 TABLETS (10 MG TOTAL) BY MOUTH DAILY AS NEEDED FOR ERECTILE DYSFUNCTION. 8 tablet 3  . omeprazole (PRILOSEC) 20 MG capsule Take 20 mg by mouth daily.    . rosuvastatin (CRESTOR) 10 MG tablet Take 1 tablet (10 mg total) by mouth daily at 6 PM. 30 tablet 0   No current facility-administered medications for this visit.     Objective: BP  124/80 (BP Location: Left Arm, Patient Position: Sitting, Cuff Size: Large)   Pulse 90   Temp 98 F (36.7 C) (Oral)   Wt 211 lb 3.2 oz (95.8 kg)   SpO2 91%   BMI 30.30 kg/m  Gen: NAD, resting comfortably CV: RRR no murmurs rubs or gallops Lungs: CTAB no crackles, wheeze, rhonchi Abdomen: soft/nontender/nondistended/normal bowel sounds. No rebound or guarding.  Ext: no edema Skin: warm, dry Neuro: grossly normal, moves all extremities  Assessment/Plan:  Hyperlipidemia S: mild poorly controlled on crestor 10mg . No myalgias.  Lab Results  Component Value Date   CHOL 146 04/15/2015   HDL 33 (L) 04/15/2015   LDLCALC 95 04/15/2015   TRIG 88 04/15/2015   CHOLHDL 4.4 04/15/2015   A/P: update lipids before visit in December- likely need to push statin dose up   Generalized anxiety disorder S: controlled reasonably well with xanax 3x a week- has run out and symptoms worsened some A/P: refilled xanax today   Hyperglycemia S: has lost a few lbs from last year but needs further weight loss Wt Readings from Last 3 Encounters:  05/27/16 211 lb 3.2 oz (95.8 kg)  06/16/15 214 lb 9.6 oz (97.3 kg)  06/10/15 214 lb (97.1 kg)  A/P: update a1c at physical- consider metformin if above 6   Coronary atherosclerosis S: History of MI. S/p 2 stents. Followed in [place]. New cardiologist in [place]. Will see later this year.  A/P:  continue aspirin and statin. Glad will be establishing as other provider no longer with [place] practice.   December AWV  Orders Placed This Encounter  Procedures  . CBC    Standing Status:   Future    Standing Expiration Date:   05/27/2017  . Comprehensive metabolic panel    [place]    Standing Status:   Future    Standing Expiration Date:   05/27/2017  . Lipid panel    Standing Status:   Future    Standing Expiration Date:   05/27/2017  . Hemoglobin A1c    [place]    Standing Status:   Future    Standing Expiration Date:   05/27/2017  . PSA    Standing Status:    Future    Standing Expiration Date:   05/27/2017    Meds ordered this encounter  Medications  . ALPRAZolam (XANAX) 0.25 MG tablet    Sig: TAKE 1 TABLET BY MOUTH EVERY DAY (MAX 3 DOSES PER WEEK)    Dispense:  30 tablet    Refill:  1    Return precautions advised.  [name], MD

## 2016-05-27 NOTE — Patient Instructions (Addendum)
Schedule lab visit a week before your annual wellness visit for labs I ordered today  Flu shot?  Orders Placed This Encounter  Procedures  . CBC    Standing Status:   Future    Standing Expiration Date:   05/27/2017  . Comprehensive metabolic panel    [place]    Standing Status:   Future    Standing Expiration Date:   05/27/2017  . Lipid panel    Standing Status:   Future    Standing Expiration Date:   05/27/2017  . Hemoglobin A1c    [place]    Standing Status:   Future    Standing Expiration Date:   05/27/2017  . PSA    Standing Status:   Future    Standing Expiration Date:   05/27/2017   No other changes- refilled xanax

## 2016-05-27 NOTE — Assessment & Plan Note (Signed)
S: mild poorly controlled on crestor 10mg . No myalgias.  Lab Results  Component Value Date   CHOL 146 04/15/2015   HDL 33 (L) 04/15/2015   LDLCALC 95 04/15/2015   TRIG 88 04/15/2015   CHOLHDL 4.4 04/15/2015   A/P: update lipids before visit in December- likely need to push statin dose up

## 2016-05-27 NOTE — Assessment & Plan Note (Signed)
S: controlled reasonably well with xanax 3x a week- has run out and symptoms worsened some A/P: refilled xanax today

## 2016-05-27 NOTE — Assessment & Plan Note (Signed)
S: History of MI. S/p 2 stents. Followed in [place]. New cardiologist in [place]. Will see later this year.  A/P: continue aspirin and statin. Glad will be establishing as other provider no longer with [place] practice.

## 2016-05-27 NOTE — Progress Notes (Signed)
Pre visit review using our clinic review tool, if applicable. No additional management support is needed unless otherwise documented below in the visit note. 

## 2016-06-02 ENCOUNTER — Other Ambulatory Visit: Payer: Self-pay | Admitting: Family Medicine

## 2016-06-03 ENCOUNTER — Other Ambulatory Visit: Payer: Self-pay

## 2016-06-03 MED ORDER — TADALAFIL 20 MG PO TABS: 10.0000 mg | ORAL_TABLET | Freq: Every day | ORAL | 3 refills | Status: DC | PRN

## 2016-06-07 ENCOUNTER — Telehealth: Payer: Self-pay | Admitting: Family Medicine

## 2016-06-07 NOTE — Telephone Encounter (Signed)
Pt needs new directions on cialis 20 mg. The directions needs to say one tablet daily or as needed [facility] [phone] opt 1

## 2016-06-08 ENCOUNTER — Other Ambulatory Visit: Payer: Self-pay

## 2016-06-08 NOTE — Telephone Encounter (Signed)
Called and clarified with the Pharmacy

## 2016-06-09 ENCOUNTER — Encounter: Payer: Self-pay | Admitting: Gastroenterology

## 2016-07-28 ENCOUNTER — Other Ambulatory Visit (INDEPENDENT_AMBULATORY_CARE_PROVIDER_SITE_OTHER): Payer: Medicare Other

## 2016-07-28 DIAGNOSIS — E785 Hyperlipidemia, unspecified: Secondary | ICD-10-CM | POA: Diagnosis not present

## 2016-07-28 LAB — COMPREHENSIVE METABOLIC PANEL
ALT: 20 U/L (ref 0–53)
AST: 20 U/L (ref 0–37)
Albumin: 3.9 g/dL (ref 3.5–5.2)
Alkaline Phosphatase: 85 U/L (ref 39–117)
BILIRUBIN TOTAL: 0.6 mg/dL (ref 0.2–1.2)
BUN: 18 mg/dL (ref 6–23)
CHLORIDE: 106 meq/L (ref 96–112)
CO2: 29 meq/L (ref 19–32)
Calcium: 9.3 mg/dL (ref 8.4–10.5)
Creatinine, Ser: 1.06 mg/dL (ref 0.40–1.50)
GFR: 72.8 mL/min (ref >60.00)
GLUCOSE: 98 mg/dL (ref 70–99)
Potassium: 4.7 mEq/L (ref 3.5–5.1)
Sodium: 141 mEq/L (ref 135–145)
Total Protein: 6.5 g/dL (ref 6.0–8.3)

## 2016-07-28 LAB — CBC
HCT: 44 % (ref 39.0–52.0)
HEMOGLOBIN: 15 g/dL (ref 13.0–17.0)
MCHC: 34.2 g/dL (ref 30.0–36.0)
MCV: 85.5 fl (ref 78.0–100.0)
Platelets: 170 10*3/uL (ref 150.0–400.0)
RBC: 5.14 Mil/uL (ref 4.22–5.81)
RDW: 14.6 % (ref 11.5–15.5)
WBC: 4.5 10*3/uL (ref 4.0–10.5)

## 2016-07-28 LAB — LIPID PANEL
CHOL/HDL RATIO: 3
Cholesterol: 123 mg/dL (ref 0–200)
HDL: 39.1 mg/dL (ref >39.00)
LDL CALC: 71 mg/dL (ref 0–99)
NONHDL: 83.8
Triglycerides: 64 mg/dL (ref 0.0–149.0)
VLDL: 12.8 mg/dL (ref 0.0–40.0)

## 2016-07-28 LAB — PSA: PSA: 3.07 ng/mL (ref 0.10–4.00)

## 2016-07-28 LAB — HEMOGLOBIN A1C: Hgb A1c MFr Bld: 5.8 % (ref 4.6–6.5)

## 2016-08-03 ENCOUNTER — Encounter: Payer: Self-pay | Admitting: Family Medicine

## 2016-08-03 ENCOUNTER — Other Ambulatory Visit: Payer: Self-pay

## 2016-08-03 ENCOUNTER — Ambulatory Visit (INDEPENDENT_AMBULATORY_CARE_PROVIDER_SITE_OTHER): Payer: Medicare Other | Admitting: Family Medicine

## 2016-08-03 VITALS — BP 120/66 | HR 64 | Temp 97.8°F | Ht 67.25 in | Wt 213.8 lb

## 2016-08-03 DIAGNOSIS — E785 Hyperlipidemia, unspecified: Secondary | ICD-10-CM

## 2016-08-03 DIAGNOSIS — R739 Hyperglycemia, unspecified: Secondary | ICD-10-CM | POA: Diagnosis not present

## 2016-08-03 DIAGNOSIS — I2510 Atherosclerotic heart disease of native coronary artery without angina pectoris: Secondary | ICD-10-CM

## 2016-08-03 DIAGNOSIS — Z0000 Encounter for general adult medical examination without abnormal findings: Secondary | ICD-10-CM | POA: Diagnosis not present

## 2016-08-03 DIAGNOSIS — R972 Elevated prostate specific antigen [PSA]: Secondary | ICD-10-CM | POA: Diagnosis not present

## 2016-08-03 DIAGNOSIS — Z23 Encounter for immunization: Secondary | ICD-10-CM | POA: Diagnosis not present

## 2016-08-03 DIAGNOSIS — F411 Generalized anxiety disorder: Secondary | ICD-10-CM | POA: Diagnosis not present

## 2016-08-03 MED ORDER — ROSUVASTATIN CALCIUM 10 MG PO TABS: 10.0000 mg | ORAL_TABLET | Freq: Every day | ORAL | 3 refills | Status: DC

## 2016-08-03 NOTE — Progress Notes (Signed)
Pre visit review using our clinic review tool, if applicable. No additional management support is needed unless otherwise documented below in the visit note. 

## 2016-08-03 NOTE — Assessment & Plan Note (Signed)
S: asymptomatic.history of 2 stents. Has not established with new cardiologist A/P: happy to provide referral if needed- glad patient asymptomatic. Continue aspirin and statin. BP looks good without meds.

## 2016-08-03 NOTE — Assessment & Plan Note (Addendum)
S:Requested PSA. Nocturia 1-2x a night, urgency, frequency. Some weak stream- all have been gradual over the years Lab Results  Component Value Date   PSA 3.07 07/28/2016   PSA 1.72 10/16/2010   PSA 1.38 04/18/2009  A/P: trend difficult to interpret but suspect BPH as cause. Rectal exam with BPH only. We opted to follow up and repeat psa 6 months. Urology referral if trending up further or if ever gets above 4 at least for their opinion.

## 2016-08-03 NOTE — Patient Instructions (Addendum)
  Jose [name] , Thank you for taking time to come for your Medicare Wellness Visit. I appreciate your ongoing commitment to your health goals. Please review the following plan we discussed and let me know if I can assist you in the future.   These are the goals we discussed: 1. Need tetanus shot - you did not want to do 2 shots in one day 2. Also pneumonia shot- once again did not want to do 2 shots one day 3. Repeat PSA in 6 months at visit 4. Push exercise to 5 days a week as long as no chest pain or shortness of breath 5. Consider weight watchers, dash eating plan or mediterranean diet to help with weight loss. Would love to have you down 10 lbs by follow up.  6. Get set up with new cardiologist- Roselyn Reef can enter referral under atherosclerosis of coronary artery if needed   This is a list of the screening recommended for you and due dates:  Health Maintenance  Topic Date Due  . Pneumonia vaccines (2 of 2 - PCV13) 08/22/2009  . Tetanus Vaccine  08/30/2013  . Shingles Vaccine  09/01/2043*  . Colon Cancer Screening  01/27/2023  . Flu Shot  Completed  *Topic was postponed. The date shown is not the original due date.

## 2016-08-03 NOTE — Assessment & Plan Note (Signed)
S: increased risk for diabetes though on lower end of range with a 1c 5.8. CBG was not elevated Lab Results  Component Value Date   HGBA1C 5.8 07/28/2016   Wt Readings from Last 3 Encounters:  05/27/16 211 lb 3.2 oz (95.8 kg)  06/16/15 214 lb 9.6 oz (97.3 kg)  06/10/15 214 lb (97.1 kg)  A/P: continue to focus on diet/exercise. By 6 month follow up perhaps target 10 lbs down.

## 2016-08-03 NOTE — Assessment & Plan Note (Signed)
S: reasonaly controlled on crestor 10mg . No myalgias.  Lab Results  Component Value Date   CHOL 123 07/28/2016   HDL 39.10 07/28/2016   LDLCALC 71 07/28/2016   TRIG 64.0 07/28/2016   CHOLHDL 3 07/28/2016   A/P: ideally <70- with coronary atherosclerosis- discussed with patient option of increasing statin vs. continuing to focus on diet and exercise- he chose diet/exercise. Also stronger statin may further increase DM risk

## 2016-08-03 NOTE — Assessment & Plan Note (Signed)
S: anxiety remains controlled on xanax 3 days a week A/P: does not need refill yet but can call when runs out

## 2016-08-03 NOTE — Progress Notes (Addendum)
Phone: [phone]  Subjective:  Patient presents today for their annual wellness visit.    Preventive Screening-Counseling & Management  Smoking Status: former Smoker quit 1997 after 30 pack years. Past 20 years so no lung cancer screening. Opts out of AAA screening this year but thinks he will do this next year Second Hand Smoking status: No smokers in home  Risk Factors Regular exercise: walks 3x a week for 30 minutes.  Diet: advocated plant based diet Wt Readings from Last 3 Encounters:  08/03/16 213 lb 12.8 oz (97 kg)  05/27/16 211 lb 3.2 oz (95.8 kg)  06/16/15 214 lb 9.6 oz (97.3 kg)  Fall Risk: None  Fall Risk  08/03/2016 06/16/2015 08/07/2014  Falls in the past year? No No No    Cardiac risk factors:  Known CAD- follows with cardiology advanced age  Hyperlipidemia but controlled No diabetes.    Depression Screen None. PHQ2 0  Depression screen [facility] 2/9 08/03/2016 06/16/2015 08/07/2014  Decreased Interest 0 0 0  Down, Depressed, Hopeless 0 0 0  PHQ - 2 Score 0 0 0    Activities of Daily Living Independent ADLs and IADLs   Hearing Difficulties: -patient endorses but has hearing aids and wears them  Cognitive Testing No reported trouble.   Normal 3 word recall  List the Names of Other Physician/Practitioners you currently use: -has seen [facility] cardiology but transferring care - Dr. Katy Fitch optho - Dr. Marcelline Mates ENT  Immunization History  Administered Date(s) Administered  . Influenza Split 08/23/2011  . Influenza, High Dose Seasonal PF 08/03/2016  . Influenza,inj,Quad PF,36+ Mos 08/07/2014  . Pneumococcal Polysaccharide-23 08/22/2008  . Td 08/31/2003   Required Immunizations needed today : flu shot today, declines others as below  Screening tests- up to date Health Maintenance Due  Topic Date Due  . ZOSTAVAX - not interested 08/22/2003  . PNA vac Low Risk Adult (2 of 2 - PCV13)- declines, but will do next year 08/22/2009  . TETANUS/TDAP - only  wants one shot a day 08/30/2013    ROS- No pertinent positives discovered in course of AWV ROS pertinent- No chest pain or shortness of breath. No headache or blurry vision.   The following were reviewed and entered/updated in epic: Past Medical History:  Diagnosis Date  . Acute lower GI bleeding 01/25/2013; 09/2014   diverticular bleed  . ANXIETY 08/22/2008  . Arthritis    "a little bit" (04/14/2015)  . Barrett's esophagus 2003   hematochezia as a result since 2003  . Basal cell cancer    removed from right forearm.   . Bradycardia 04/14/2015  . CORONARY ARTERY DISEASE   . DIVERTICULOSIS, COLON 2003  . ERECTILE DYSFUNCTION 08/22/2008  . GERD (gastroesophageal reflux disease)   . Heart murmur 04/14/2015   "they think I did; that's why I'm here" (04/14/2015)  . HYPERLIPIDEMIA 02/09/2007  . Kidney stones    "blasted or passed" (04/14/2015)  . MI (myocardial infarction) 2000  . OBESITY 03/20/2010  . Squamous cell cancer of skin of earlobe    removed from area behind right earlobe   Patient Active Problem List   Diagnosis Date Noted  . Coronary atherosclerosis 02/09/2007    Priority: High  . Elevated PSA 08/03/2016    Priority: Medium  . Generalized anxiety disorder 08/22/2008    Priority: Medium  . Hyperlipidemia 02/09/2007    Priority: Medium  . Barrett's esophagus 02/09/2007    Priority: Medium  . Actinic keratosis 08/07/2014    Priority: Low  .  Elevated blood pressure 03/29/2014    Priority: Low  . Hyperglycemia 02/26/2013    Priority: Low  . OBESITY 03/20/2010    Priority: Low  . ERECTILE DYSFUNCTION 08/22/2008    Priority: Low  . Diverticulosis of large intestine 02/09/2007    Priority: Low  . Benign prostatic hypertrophy (BPH) with nocturia 05/27/2016  . Lumbosacral radiculopathy 06/17/2015  . HLD (hyperlipidemia) 06/17/2015  . Numbness 06/17/2015  . Right leg numbness   . Bradycardia 04/14/2015  . Acute blood loss anemia 10/17/2014  . Diverticulosis of colon  with hemorrhage   . GIB (gastrointestinal bleeding) 10/15/2014   Past Surgical History:  Procedure Laterality Date  . BASAL CELL CARCINOMA EXCISION     posterior right ear.   Marland Kitchen CATARACT EXTRACTION, BILATERAL    . COLONOSCOPY N/A 01/26/2013   Procedure: COLONOSCOPY;  Surgeon: Milus Banister, MD;  Location: [facility];  Service: Endoscopy;  Laterality: N/A;  . CORONARY ANGIOPLASTY WITH STENT PLACEMENT  09/06/1998   LAD stent per Dr Elonda Husky in [place]. 2 stents.   . ESOPHAGOGASTRODUODENOSCOPY (EGD) WITH ESOPHAGEAL DILATION     "q time I have an EGD; usually q 3-4 years" (04/14/2015)  . LITHOTRIPSY  X 2  . SQUAMOUS CELL CARCINOMA EXCISION     right forearm  . TONSILLECTOMY  1971    Family History  Problem Relation Age of Onset  . Alcohol abuse Father   . Liver disease Father   . Heart disease Brother     CABG  . Colon cancer Neg Hx     Medications- reviewed and updated Current Outpatient Prescriptions  Medication Sig Dispense Refill  . ALPRAZolam (XANAX) 0.25 MG tablet TAKE 1 TABLET BY MOUTH EVERY DAY (MAX 3 DOSES PER WEEK) 30 tablet 1  . aspirin 81 MG EC tablet Take 1 tablet (81 mg total) by mouth daily. Swallow whole. 30 tablet 12  . omeprazole (PRILOSEC) 20 MG capsule TAKE 1 CAPSULE DAILY (NEED OFFICE VISIT) 90 capsule 2  . rosuvastatin (CRESTOR) 10 MG tablet Take 1 tablet (10 mg total) by mouth daily at 6 PM. 90 tablet 3  . tadalafil (CIALIS) 20 MG tablet Take 0.5 tablets (10 mg total) by mouth daily as needed for erectile dysfunction. 8 tablet 3   No current facility-administered medications for this visit.     Allergies-reviewed and updated Allergies  Allergen Reactions  . Atorvastatin     REACTION: muscle weakness    Social History   Social History  . Marital status: Married    Spouse name: N/A  . Number of children: N/A  . Years of education: N/A   Social History Main Topics  . Smoking status: Former Smoker    Packs/day: 1.00    Years: 30.00    Types:  Cigarettes  . Smokeless tobacco: Never Used     Comment: "quit smoking cigarettes in ~ 1990"  . Alcohol use Yes     Comment: 04/14/2015 "might drink a couple beers/month"  . Drug use: No  . Sexual activity: No   Other Topics Concern  . None   Social History Narrative   Still working at [employer]   Married 35 years in 2015, 4 kids from first marriage, 3 grandkids   Hobbies: woodworking, tv movies- old action movies like Rambo    Objective: BP 120/66 (BP Location: Left Arm, Patient Position: Sitting, Cuff Size: Large)   Pulse 64   Temp 97.8 F (36.6 C) (Oral)   Ht 5' 7.25" (1.708  m)   Wt 213 lb 12.8 oz (97 kg)   SpO2 93%   BMI 33.24 kg/m  Gen: NAD, resting comfortably HEENT: Mucous membranes are moist. Oropharynx normal Neck: no thyromegaly CV: RRR no murmurs rubs or gallops Lungs: CTAB no crackles, wheeze, rhonchi Abdomen: soft/nontender/nondistended/normal bowel sounds. No rebound or guarding.  Ext: no edema Skin: warm, dry Neuro: grossly normal, moves all extremities, PERRLA Rectal: normal tone, diffusely enlarged prostate, no masses or tenderness   Assessment/Plan:  AWV completed- discussed recommended screenings anddocumented any personalized health advice and referrals for preventive counseling. See AVS as well which was given to patient.   Status of chronic or acute concerns   Hyperlipidemia S: reasonaly controlled on crestor 10mg . No myalgias.  Lab Results  Component Value Date   CHOL 123 07/28/2016   HDL 39.10 07/28/2016   LDLCALC 71 07/28/2016   TRIG 64.0 07/28/2016   CHOLHDL 3 07/28/2016   A/P: ideally <70- with coronary atherosclerosis- discussed with patient option of increasing statin vs. continuing to focus on diet and exercise- he chose diet/exercise. Also stronger statin may further increase DM risk  Hyperglycemia S: increased risk for diabetes though on lower end of range with a 1c 5.8. CBG was not elevated Lab Results  Component  Value Date   HGBA1C 5.8 07/28/2016   Wt Readings from Last 3 Encounters:  05/27/16 211 lb 3.2 oz (95.8 kg)  06/16/15 214 lb 9.6 oz (97.3 kg)  06/10/15 214 lb (97.1 kg)  A/P: continue to focus on diet/exercise. By 6 month follow up perhaps target 10 lbs down.   Elevated PSA S:Requested PSA. Nocturia 1-2x a night, urgency, frequency. Some weak stream- all have been gradual over the years Lab Results  Component Value Date   PSA 3.07 07/28/2016   PSA 1.72 10/16/2010   PSA 1.38 04/18/2009  A/P: trend difficult to interpret but suspect BPH as cause. Rectal exam with BPH only. We opted to follow up and repeat psa 6 months. Urology referral if trending up further or if ever gets above 4 at least for their opinion.    Coronary atherosclerosis S: asymptomatic.history of 2 stents. Has not established with new cardiologist A/P: happy to provide referral if needed- glad patient asymptomatic. Continue aspirin and statin. BP looks good without meds.     Generalized anxiety disorder S: anxiety remains controlled on xanax 3 days a week A/P: does not need refill yet but can call when runs out    Return in about 6 months (around 02/01/2017).  Td and prevnar at follow up likely- declines today  Orders Placed This Encounter  Procedures  . Flu vaccine HIGH DOSE PF    Meds ordered this encounter  Medications  . rosuvastatin (CRESTOR) 10 MG tablet    Sig: Take 1 tablet (10 mg total) by mouth daily at 6 PM.    Dispense:  90 tablet    Refill:  3    Return precautions advised.  [name], MD

## 2016-11-17 ENCOUNTER — Encounter: Payer: Self-pay | Admitting: Family Medicine

## 2016-11-17 ENCOUNTER — Ambulatory Visit (INDEPENDENT_AMBULATORY_CARE_PROVIDER_SITE_OTHER): Payer: Medicare Other | Admitting: Family Medicine

## 2016-11-17 VITALS — BP 120/80 | HR 58 | Temp 98.2°F | Wt 216.1 lb

## 2016-11-17 DIAGNOSIS — M542 Cervicalgia: Secondary | ICD-10-CM

## 2016-11-17 MED ORDER — PREDNISONE 10 MG PO TABS: ORAL_TABLET | ORAL | 0 refills | Status: DC

## 2016-11-17 MED ORDER — CYCLOBENZAPRINE HCL 5 MG PO TABS: 5.0000 mg | ORAL_TABLET | Freq: Every evening | ORAL | 0 refills | Status: DC | PRN

## 2016-11-17 NOTE — Patient Instructions (Signed)
Follow up with Dr Yong Channel in 2 weeks if no better and sooner for any weakness,numbness, or progressive pain. Try heat for symptomatic relief.

## 2016-11-17 NOTE — Progress Notes (Signed)
Subjective:     Patient ID: Jose [name], male   DOB: 1941-01-23, 76 y.o.   MRN: [id number]  HPI Patient seen with five-day history of neck pain. Pain is somewhat poorly localized but mostly along the base of the cervical spine with radiation toward trapezius muscle bilaterally. He states his pain has been especially bothersome at night and difficulty sleeping. He tried Tylenol without much relief. Denies radiculopathy symptoms. Mostly sharp pain and worse with rotational to the left greater than right. No known injury.  Pain severity 8/10 at its worse. Fluctuates in intensity. Denies upper extremity weakness. He's had some chronic paresthesia right hand unchanged. Patient had cervical MRI 8/16 which showed multilevel degenerative spondylosis especially C4-C5 and C6-C7.  No fever.    Past Medical History:  Diagnosis Date  . Acute lower GI bleeding 01/25/2013; 09/2014   diverticular bleed  . ANXIETY 08/22/2008  . Arthritis    "a little bit" (04/14/2015)  . Barrett's esophagus 2003   hematochezia as a result since 2003  . Basal cell cancer    removed from right forearm.   . Bradycardia 04/14/2015  . CORONARY ARTERY DISEASE   . DIVERTICULOSIS, COLON 2003  . ERECTILE DYSFUNCTION 08/22/2008  . GERD (gastroesophageal reflux disease)   . Heart murmur 04/14/2015   "they think I did; that's why I'm here" (04/14/2015)  . HYPERLIPIDEMIA 02/09/2007  . Kidney stones    "blasted or passed" (04/14/2015)  . MI (myocardial infarction) 2000  . OBESITY 03/20/2010  . 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 BASAL CELL CARCINOMA EXCISION     posterior right ear.   Marland Kitchen CATARACT EXTRACTION, BILATERAL    . COLONOSCOPY N/A 01/26/2013   Procedure: COLONOSCOPY;  Surgeon: Milus Banister, MD;  Location: [facility];  Service: Endoscopy;  Laterality: N/A;  . CORONARY ANGIOPLASTY WITH STENT PLACEMENT  09/06/1998   LAD  stent per Dr Elonda Husky in [place]. 2 stents.   . ESOPHAGOGASTRODUODENOSCOPY (EGD) WITH ESOPHAGEAL DILATION     "q time I have an EGD; usually q 3-4 years" (04/14/2015)  . LITHOTRIPSY  X 2  . SQUAMOUS CELL CARCINOMA EXCISION     right forearm  . TONSILLECTOMY  1971    reports that he has quit smoking. His smoking use included Cigarettes. He has a 30.00 pack-year smoking history. He has never used smokeless tobacco. He reports that he drinks alcohol. He reports that he does not use drugs. family history includes Alcohol abuse in his father; Heart disease in his brother; Liver disease in his father. Allergies  Allergen Reactions  . Atorvastatin     REACTION: muscle weakness     Review of Systems  Respiratory: Negative for shortness of breath.   Cardiovascular: Negative for chest pain.  Musculoskeletal: Positive for neck pain.  Neurological: Negative for weakness and numbness.       Objective:   Physical Exam  Constitutional: He appears well-developed and well-nourished.  Neck: Neck supple.  Cardiovascular: Normal rate and regular rhythm.   Pulmonary/Chest: Effort normal and breath sounds normal. No respiratory distress. He has no wheezes. He has no rales.  Musculoskeletal:  He has no spinal tenderness. He has fairly good range of motion neck but he does have some pain with rotation more to the left side.  He has tenderness to palpation of both trapezius muscles  Lymphadenopathy:    He has  no cervical adenopathy.  Neurological:  2+ reflexes throughout upper extremities. Normal sensory function touch. Full strength throughout.       Assessment:     Cervical neck pain. Suspect related to cervical spondylosis. Denies recent injury.  Non-focal neurologic exam at this time.      Plan:     -Try moist heat for symptomatic relief -Cautious taper of prednisone over the next week -Cautious use of low-dose Flexeril 5 mg daily at bedtime -Follow-up immediately for any upper extremity  numbness or weakness -Follow-up with primary in 2 weeks if pain not improved and sooner as needed  Eulas Post MD [facility]

## 2016-11-17 NOTE — Progress Notes (Signed)
Pre visit review using our clinic review tool, if applicable. No additional management support is needed unless otherwise documented below in the visit note. 

## 2016-11-24 ENCOUNTER — Encounter: Payer: Self-pay | Admitting: Gastroenterology

## 2016-11-25 ENCOUNTER — Telehealth: Payer: Self-pay

## 2016-11-25 NOTE — Telephone Encounter (Signed)
Received PA request for Cialis 20 mg. Filled out form from [facility] & faxed paper back. Awaiting response.

## 2016-12-03 NOTE — Telephone Encounter (Signed)
PA approved, no need to fax since Rx is at mail order.

## 2016-12-10 ENCOUNTER — Other Ambulatory Visit: Payer: Self-pay

## 2016-12-10 MED ORDER — TADALAFIL 20 MG PO TABS: 10.0000 mg | ORAL_TABLET | Freq: Every day | ORAL | 3 refills | Status: DC | PRN

## 2016-12-21 ENCOUNTER — Ambulatory Visit (HOSPITAL_COMMUNITY): Payer: Medicare Other | Admitting: Certified Registered Nurse Anesthetist

## 2016-12-21 ENCOUNTER — Ambulatory Visit (HOSPITAL_COMMUNITY): Payer: Medicare Other

## 2016-12-21 ENCOUNTER — Ambulatory Visit (HOSPITAL_COMMUNITY)
Admission: RE | Admit: 2016-12-21 | Discharge: 2016-12-21 | Disposition: A | Discharge status: Home / Self Care | Payer: Medicare Other | Source: Ambulatory Visit | Attending: Urology | Admitting: Urology

## 2016-12-21 ENCOUNTER — Other Ambulatory Visit: Payer: Self-pay | Admitting: Urology

## 2016-12-21 ENCOUNTER — Encounter (HOSPITAL_COMMUNITY): Payer: Self-pay | Admitting: *Deleted

## 2016-12-21 ENCOUNTER — Encounter (HOSPITAL_COMMUNITY)
Admission: RE | Disposition: A | Discharge status: Home / Self Care | Payer: Self-pay | Source: Ambulatory Visit | Attending: Urology

## 2016-12-21 DIAGNOSIS — N201 Calculus of ureter: Secondary | ICD-10-CM | POA: Diagnosis not present

## 2016-12-21 DIAGNOSIS — Z87442 Personal history of urinary calculi: Secondary | ICD-10-CM | POA: Insufficient documentation

## 2016-12-21 DIAGNOSIS — R1084 Generalized abdominal pain: Secondary | ICD-10-CM | POA: Diagnosis not present

## 2016-12-21 DIAGNOSIS — N401 Enlarged prostate with lower urinary tract symptoms: Secondary | ICD-10-CM | POA: Diagnosis not present

## 2016-12-21 DIAGNOSIS — R3129 Other microscopic hematuria: Secondary | ICD-10-CM | POA: Diagnosis not present

## 2016-12-21 DIAGNOSIS — N132 Hydronephrosis with renal and ureteral calculous obstruction: Secondary | ICD-10-CM | POA: Insufficient documentation

## 2016-12-21 DIAGNOSIS — N1330 Unspecified hydronephrosis: Secondary | ICD-10-CM | POA: Diagnosis not present

## 2016-12-21 DIAGNOSIS — N281 Cyst of kidney, acquired: Secondary | ICD-10-CM | POA: Diagnosis not present

## 2016-12-21 DIAGNOSIS — R351 Nocturia: Secondary | ICD-10-CM | POA: Diagnosis not present

## 2016-12-21 DIAGNOSIS — Z419 Encounter for procedure for purposes other than remedying health state, unspecified: Secondary | ICD-10-CM

## 2016-12-21 DIAGNOSIS — N23 Unspecified renal colic: Secondary | ICD-10-CM | POA: Diagnosis not present

## 2016-12-21 HISTORY — PX: CYSTOSCOPY W/ URETERAL STENT PLACEMENT: SHX1429

## 2016-12-21 LAB — BASIC METABOLIC PANEL
ANION GAP: 5 (ref 5–15)
BUN: 13 mg/dL (ref 6–20)
CALCIUM: 9.4 mg/dL (ref 8.9–10.3)
CO2: 26 mmol/L (ref 22–32)
Chloride: 108 mmol/L (ref 101–111)
Creatinine, Ser: 1.31 mg/dL — ABNORMAL HIGH (ref 0.61–1.24)
GFR, EST NON AFRICAN AMERICAN: 52 mL/min — AB (ref >60)
Glucose, Bld: 128 mg/dL — ABNORMAL HIGH (ref 65–99)
POTASSIUM: 4 mmol/L (ref 3.5–5.1)
SODIUM: 139 mmol/L (ref 135–145)

## 2016-12-21 LAB — HEMOGLOBIN: HEMOGLOBIN: 15.8 g/dL (ref 13.0–17.0)

## 2016-12-21 SURGERY — CYSTOSCOPY, WITH RETROGRADE PYELOGRAM AND URETERAL STENT INSERTION
Anesthesia: General | Site: Ureter | Laterality: Left

## 2016-12-21 MED ORDER — CEFAZOLIN SODIUM-DEXTROSE 2-4 GM/100ML-% IV SOLN
2.0000 g | INTRAVENOUS | Status: AC
  Administered 2016-12-21: 2 g via INTRAVENOUS

## 2016-12-21 MED ORDER — TRAMADOL HCL 50 MG PO TABS: 50.0000 mg | ORAL_TABLET | Freq: Four times a day (QID) | ORAL | 0 refills | Status: DC | PRN

## 2016-12-21 MED ORDER — LACTATED RINGERS IV SOLN
INTRAVENOUS | Status: DC
  Administered 2016-12-21: 15:00:00 via INTRAVENOUS

## 2016-12-21 MED ORDER — ONDANSETRON HCL 4 MG/2ML IJ SOLN
INTRAMUSCULAR | Status: DC | PRN
  Administered 2016-12-21: 4 mg via INTRAVENOUS

## 2016-12-21 MED ORDER — PROPOFOL 10 MG/ML IV BOLUS
INTRAVENOUS | Status: DC | PRN
  Administered 2016-12-21: 150 mg via INTRAVENOUS

## 2016-12-21 MED ORDER — KETOROLAC TROMETHAMINE 10 MG PO TABS: 10.0000 mg | ORAL_TABLET | Freq: Four times a day (QID) | ORAL | 1 refills | Status: DC | PRN

## 2016-12-21 MED ORDER — CEFAZOLIN SODIUM-DEXTROSE 2-4 GM/100ML-% IV SOLN
INTRAVENOUS | Status: AC
  Filled 2016-12-21: qty 100

## 2016-12-21 MED ORDER — SUCCINYLCHOLINE CHLORIDE 200 MG/10ML IV SOSY
PREFILLED_SYRINGE | INTRAVENOUS | Status: AC
  Filled 2016-12-21: qty 10

## 2016-12-21 MED ORDER — IOHEXOL 300 MG/ML  SOLN
INTRAMUSCULAR | Status: DC | PRN
  Administered 2016-12-21: 5 mL via URETHRAL

## 2016-12-21 MED ORDER — SUCCINYLCHOLINE CHLORIDE 200 MG/10ML IV SOSY
PREFILLED_SYRINGE | INTRAVENOUS | Status: DC | PRN
  Administered 2016-12-21: 120 mg via INTRAVENOUS

## 2016-12-21 MED ORDER — LIDOCAINE HCL 2 % EX GEL
CUTANEOUS | Status: AC
  Filled 2016-12-21: qty 5

## 2016-12-21 MED ORDER — LIDOCAINE 2% (20 MG/ML) 5 ML SYRINGE
INTRAMUSCULAR | Status: AC
  Filled 2016-12-21: qty 5

## 2016-12-21 MED ORDER — ONDANSETRON HCL 4 MG/2ML IJ SOLN
INTRAMUSCULAR | Status: AC
  Filled 2016-12-21: qty 2

## 2016-12-21 MED ORDER — FENTANYL CITRATE (PF) 100 MCG/2ML IJ SOLN
INTRAMUSCULAR | Status: AC
  Filled 2016-12-21: qty 2

## 2016-12-21 MED ORDER — PROPOFOL 10 MG/ML IV BOLUS
INTRAVENOUS | Status: AC
  Filled 2016-12-21: qty 20

## 2016-12-21 MED ORDER — SENNOSIDES-DOCUSATE SODIUM 8.6-50 MG PO TABS: 1.0000 | ORAL_TABLET | Freq: Two times a day (BID) | ORAL | 0 refills | Status: DC

## 2016-12-21 MED ORDER — FENTANYL CITRATE (PF) 100 MCG/2ML IJ SOLN
INTRAMUSCULAR | Status: DC | PRN
  Administered 2016-12-21: 50 ug via INTRAVENOUS

## 2016-12-21 MED ORDER — HYDROMORPHONE HCL 1 MG/ML IJ SOLN: 0.2500 mg | INTRAMUSCULAR | Status: DC | PRN

## 2016-12-21 MED ORDER — LIDOCAINE 2% (20 MG/ML) 5 ML SYRINGE
INTRAMUSCULAR | Status: DC | PRN
  Administered 2016-12-21: 50 mg via INTRAVENOUS

## 2016-12-21 MED ORDER — PROMETHAZINE HCL 25 MG/ML IJ SOLN
6.2500 mg | INTRAMUSCULAR | Status: DC | PRN
Start: 2016-12-21 — End: 2016-12-21

## 2016-12-21 SURGICAL SUPPLY — 15 items
BAG URO CATCHER STRL LF (MISCELLANEOUS) ×3 IMPLANT
BASKET ZERO TIP NITINOL 2.4FR (BASKET) IMPLANT
BSKT STON RTRVL ZERO TP 2.4FR (BASKET)
CATH INTERMIT  6FR 70CM (CATHETERS) IMPLANT
CLOTH BEACON ORANGE TIMEOUT ST (SAFETY) ×3 IMPLANT
COVER SURGICAL LIGHT HANDLE (MISCELLANEOUS) ×3 IMPLANT
GLOVE BIOGEL M STRL SZ7.5 (GLOVE) ×3 IMPLANT
GOWN STRL REUS W/TWL LRG LVL3 (GOWN DISPOSABLE) ×6 IMPLANT
GUIDEWIRE ANG ZIPWIRE 038X150 (WIRE) ×2 IMPLANT
GUIDEWIRE STR DUAL SENSOR (WIRE) ×1 IMPLANT
MANIFOLD NEPTUNE II (INSTRUMENTS) ×3 IMPLANT
PACK CYSTO (CUSTOM PROCEDURE TRAY) ×3 IMPLANT
STENT URET 6FRX26 CONTOUR (STENTS) ×2 IMPLANT
TUBING CONNECTING 10 (TUBING) ×2 IMPLANT
TUBING CONNECTING 10' (TUBING) ×1

## 2016-12-21 NOTE — Interval H&P Note (Signed)
History and Physical Interval Note:  12/21/2016 4:11 PM  Jose [name]  has presented today for surgery, with the diagnosis of let ureteropelvic junction calculus  The various methods of treatment have been discussed with the patient and family. After consideration of risks, benefits and other options for treatment, the patient has consented to  Procedure(s): CYSTOSCOPY WITH RETROGRADE PYELOGRAM/URETERAL STENT PLACEMENT (Left) as a surgical intervention .  The patient's history has been reviewed, patient examined, no change in status, stable for surgery.  I have reviewed the patient's chart and labs.  Questions were answered to the patient's satisfaction.     Sincere Liuzzi

## 2016-12-21 NOTE — Brief Op Note (Signed)
12/21/2016  4:50 PM  PATIENT:  [name]  76 y.o. male  PRE-OPERATIVE DIAGNOSIS:  let ureteropelvic junction calculus  POST-OPERATIVE DIAGNOSIS:  let ureteropelvic junction calculus  PROCEDURE:  Procedure(s): CYSTOSCOPY WITH RETROGRADE PYELOGRAM/URETERAL STENT PLACEMENT (Left)  SURGEON:  Surgeon(s) and Role:    * Alexis Frock, MD - Primary  PHYSICIAN ASSISTANT:   ASSISTANTS: none   ANESTHESIA:   general  EBL:  No intake/output data recorded.  BLOOD ADMINISTERED:none  DRAINS: none   LOCAL MEDICATIONS USED:  NONE  SPECIMEN:  No Specimen  DISPOSITION OF SPECIMEN:  N/A  COUNTS:  YES  TOURNIQUET:  * No tourniquets in log *  DICTATION: .Other Dictation: Dictation Number [id number]  PLAN OF CARE: Discharge to home after PACU  PATIENT DISPOSITION:  PACU - hemodynamically stable.   Delay start of Pharmacological VTE agent (>24hrs) due to surgical blood loss or risk of bleeding: yes

## 2016-12-21 NOTE — Anesthesia Preprocedure Evaluation (Addendum)
Anesthesia Evaluation  Patient identified by MRN, date of birth, ID band Patient awake    Reviewed: Allergy & Precautions, NPO status , Patient's Chart, lab work & pertinent test results  History of Anesthesia Complications Negative for: history of anesthetic complications  Airway Mallampati: II  TM Distance: >3 FB Neck ROM: Full    Dental no notable dental hx. (+) Dental Advisory Given   Pulmonary neg pulmonary ROS, former smoker,    Pulmonary exam normal        Cardiovascular + CAD and + Past MI  Normal cardiovascular exam  Impressions:  - Normal LV size with mild LV hypertrophy. EF 55% with basal   anterolateral hypokinesis. Mildly dilated RV with normal systolic   function.   Neuro/Psych PSYCHIATRIC DISORDERS Anxiety negative neurological ROS  negative psych ROS   GI/Hepatic Neg liver ROS, GERD  ,  Endo/Other  negative endocrine ROS  Renal/GU   negative genitourinary   Musculoskeletal negative musculoskeletal ROS (+)   Abdominal   Peds negative pediatric ROS (+)  Hematology negative hematology ROS (+)   Anesthesia Other Findings   Reproductive/Obstetrics negative OB ROS                            Anesthesia Physical Anesthesia Plan  ASA: III  Anesthesia Plan: General   Post-op Pain Management:    Induction: Intravenous, Rapid sequence and Cricoid pressure planned  Airway Management Planned: Oral ETT  Additional Equipment:   Intra-op Plan:   Post-operative Plan: Extubation in OR  Informed Consent: I have reviewed the patients History and Physical, chart, labs and discussed the procedure including the risks, benefits and alternatives for the proposed anesthesia with the patient or authorized representative who has indicated his/her understanding and acceptance.   Dental advisory given  Plan Discussed with: Anesthesiologist, CRNA and Surgeon  Anesthesia Plan  Comments:        Anesthesia Quick Evaluation

## 2016-12-21 NOTE — Discharge Instructions (Signed)
Cystoscopy, Care After Refer to this sheet in the next few weeks. These instructions provide you with information about caring for yourself after your procedure. Your health care provider may also give you more specific instructions. Your treatment has been planned according to current medical practices, but problems sometimes occur. Call your health care provider if you have any problems or questions after your procedure. What can I expect after the procedure? After the procedure, it is common to have:  Mild pain when you urinate. Pain should stop within a few minutes after you urinate. This may last for up to 1 week.  A small amount of blood in your urine for several days.  Feeling like you need to urinate but producing only a small amount of urine. Follow these instructions at home:   Medicines   Take over-the-counter and prescription medicines only as told by your health care provider.  If you were prescribed an antibiotic medicine, take it as told by your health care provider. Do not stop taking the antibiotic even if you start to feel better. General instructions    Return to your normal activities as told by your health care provider. Ask your health care provider what activities are safe for you.  Do not drive for 24 hours if you received a sedative.  Watch for any blood in your urine. If the amount of blood in your urine increases, call your health care provider.  Follow instructions from your health care provider about eating or drinking restrictions.  If a tissue sample was removed for testing (biopsy) during your procedure, it is your responsibility to get your test results. Ask your health care provider or the department performing the test when your results will be ready.  Drink enough fluid to keep your urine clear or pale yellow.  Keep all follow-up visits as told by your health care provider. This is important. Contact a health care provider if:  You have pain that  gets worse or does not get better with medicine, especially pain when you urinate.  You have difficulty urinating. Get help right away if:  You have more blood in your urine.  You have blood clots in your urine.  You have abdominal pain.  You have a fever or chills.  You are unable to urinate. This information is not intended to replace advice given to you by your health care provider. Make sure you discuss any questions you have with your health care provider. Document Released: 03/05/2005 Document Revised: 01/22/2016 Document Reviewed: 07/03/2015 Elsevier Interactive Patient Education  2017 [place] Anesthesia, Adult, Care After These instructions provide you with information about caring for yourself after your procedure. Your health care provider may also give you more specific instructions. Your treatment has been planned according to current medical practices, but problems sometimes occur. Call your health care provider if you have any problems or questions after your procedure. What can I expect after the procedure? After the procedure, it is common to have:  Vomiting.  A sore throat.  Mental slowness. It is common to feel:  Nauseous.  Cold or shivery.  Sleepy.  Tired.  Sore or achy, even in parts of your body where you did not have surgery. Follow these instructions at home: For at least 24 hours after the procedure:   Do not:  Participate in activities where you could fall or become injured.  Drive.  Use heavy machinery.  Drink alcohol.  Take sleeping pills or medicines that cause drowsiness.  Make important decisions or sign legal documents.  Take care of children on your own.  Rest. Eating and drinking   If you vomit, drink water, juice, or soup when you can drink without vomiting.  Drink enough fluid to keep your urine clear or pale yellow.  Make sure you have little or no nausea before eating solid foods.  Follow the diet  recommended by your health care provider. General instructions   Have a responsible adult stay with you until you are awake and alert.  Return to your normal activities as told by your health care provider. Ask your health care provider what activities are safe for you.  Take over-the-counter and prescription medicines only as told by your health care provider.  If you smoke, do not smoke without supervision.  Keep all follow-up visits as told by your health care provider. This is important. Contact a health care provider if:  You continue to have nausea or vomiting at home, and medicines are not helpful.  You cannot drink fluids or start eating again.  You cannot urinate after 8-12 hours.  You develop a skin rash.  You have fever.  You have increasing redness at the site of your procedure. Get help right away if:  You have difficulty breathing.  You have chest pain.  You have unexpected bleeding.  You feel that you are having a life-threatening or urgent problem. This information is not intended to replace advice given to you by your health care provider. Make sure you discuss any questions you have with your health care provider. Document Released: 11/22/2000 Document Revised: 01/19/2016 Document Reviewed: 07/31/2015 Elsevier Interactive Patient Education  2017 [place] may have urinary urgency (bladder spasms) and bloody urine on / off with stent in place. This is normal.  2 - Call MD or go to ER for fever >102, severe pain / nausea / vomiting not relieved by medications, or acute change in medical status

## 2016-12-21 NOTE — Anesthesia Procedure Notes (Signed)
Procedure Name: Intubation Date/Time: 12/21/2016 4:34 PM Performed by: Anne Fu Pre-anesthesia Checklist: Patient identified, Emergency Drugs available, Suction available, Patient being monitored and Timeout performed Patient Re-evaluated:Patient Re-evaluated prior to inductionOxygen Delivery Method: Circle system utilized Preoxygenation: Pre-oxygenation with 100% oxygen Intubation Type: IV induction Ventilation: Mask ventilation without difficulty Laryngoscope Size: Mac and 4 Grade View: Grade II Tube type: Oral Tube size: 7.5 mm Number of attempts: 1 Airway Equipment and Method: Stylet Placement Confirmation: ETT inserted through vocal cords under direct vision,  positive ETCO2,  CO2 detector and breath sounds checked- equal and bilateral Secured at: 21 cm Tube secured with: Tape Dental Injury: Teeth and Oropharynx as per pre-operative assessment

## 2016-12-21 NOTE — Anesthesia Postprocedure Evaluation (Addendum)
Anesthesia Post Note  Patient: BURDETTE FOREHAND  Procedure(s) Performed: Procedure(s) (LRB): CYSTOSCOPY WITH RETROGRADE PYELOGRAM/URETERAL STENT PLACEMENT (Left)  Patient location during evaluation: PACU Anesthesia Type: General Level of consciousness: sedated Pain management: pain level controlled Vital Signs Assessment: post-procedure vital signs reviewed and stable Respiratory status: spontaneous breathing and respiratory function stable Cardiovascular status: stable Anesthetic complications: no       Last Vitals:  Vitals:   12/21/16 1730 12/21/16 1743  BP:  (!) 150/74  Pulse:  (!) 48  Resp:  14  Temp: 36.4 C 36.4 C    Last Pain:  Vitals:   12/21/16 1459  TempSrc:   PainSc: 4                  Mayson Mcneish DANIEL

## 2016-12-21 NOTE — Transfer of Care (Signed)
Immediate Anesthesia Transfer of Care Note  Patient: Jose [name]  Procedure(s) Performed: Procedure(s): CYSTOSCOPY WITH RETROGRADE PYELOGRAM/URETERAL STENT PLACEMENT (Left)  Patient Location: PACU  Anesthesia Type:General  Level of Consciousness:  sedated, patient cooperative and responds to stimulation  Airway & Oxygen Therapy:Patient Spontanous Breathing and Patient connected to face mask oxgen  Post-op Assessment:  Report given to PACU RN and Post -op Vital signs reviewed and stable  Post vital signs:  Reviewed and stable  Last Vitals:  Vitals:   12/21/16 1401  BP: (!) 155/75  Pulse: (!) 49  Resp: 18  Temp: 96.7 C    Complications: No apparent anesthesia complications

## 2016-12-21 NOTE — H&P (Signed)
Jose [name] is an 76 y.o. male.    Chief Complaint: Pre-op LEFT Ureteral Stent Placement  HPI:   1 - Recurrent Nephrolithiasis-  Pre 4098 - colic x 5 08/1912 - Left 72mm UPJ stone with mod hydro and severe nausea. Stone is solitary, 80mm, 1250HU, SSD 16cm.   2 - Medical Stone Disease -  Eval 2018 - BMP,PTH,Urate - pending; Composition = pending; 24 Hr Urines - pending  3 - Bilateral Non-Complex Renal Cysts -  Rt upper 2.4cm, 9HU and Lt mid 7.4cm 9HU cysts incidetnal on CT stone 2018. No large nodules, coarse calcifications, or mass effect.  Today "Jose [name]" is seen to proceed with left ureteral stent placement for large left proximal ureteral stone. NO interval fevers.   Past Medical History:  Diagnosis Date  . Acute lower GI bleeding 01/25/2013; 09/2014   diverticular bleed  . ANXIETY 08/22/2008  . Arthritis    "a little bit" (04/14/2015)  . Barrett's esophagus 2003   hematochezia as a result since 2003  . Basal cell cancer    removed from right forearm.   . Bradycardia 04/14/2015  . CORONARY ARTERY DISEASE   . DIVERTICULOSIS, COLON 2003  . ERECTILE DYSFUNCTION 08/22/2008  . GERD (gastroesophageal reflux disease)   . Heart murmur 04/14/2015   "they think I did; that's why I'm here" (04/14/2015)  . HYPERLIPIDEMIA 02/09/2007  . Kidney stones    "blasted or passed" (04/14/2015)  . MI (myocardial infarction) ([place]) 2000  . OBESITY 03/20/2010  . Squamous cell cancer of skin of earlobe    removed from area behind right earlobe    Past Surgical History:  Procedure Laterality Date  . BASAL CELL CARCINOMA EXCISION     posterior right ear.   Marland Kitchen CATARACT EXTRACTION, BILATERAL    . COLONOSCOPY N/A 01/26/2013   Procedure: COLONOSCOPY;  Surgeon: Milus Banister, MD;  Location: [facility];  Service: Endoscopy;  Laterality: N/A;  . CORONARY ANGIOPLASTY WITH STENT PLACEMENT  09/06/1998   LAD stent per Dr Elonda Husky in [place]. 2 stents.   . ESOPHAGOGASTRODUODENOSCOPY (EGD) WITH ESOPHAGEAL  DILATION     "q time I have an EGD; usually q 3-4 years" (04/14/2015)  . LITHOTRIPSY  X 2  . SQUAMOUS CELL CARCINOMA EXCISION     right forearm  . TONSILLECTOMY  1971    Family History  Problem Relation Age of Onset  . Alcohol abuse Father   . Liver disease Father   . Heart disease Brother     CABG  . Colon cancer Neg Hx    Social History:  reports that he has quit smoking. His smoking use included Cigarettes. He has a 30.00 pack-year smoking history. He has never used smokeless tobacco. He reports that he drinks alcohol. He reports that he does not use drugs.  Allergies:  Allergies  Allergen Reactions  . Atorvastatin     REACTION: muscle weakness    No prescriptions prior to admission.    No results found for this or any previous visit (from the past 48 hour(s)). No results found.  Review of Systems  Constitutional: Negative.  Negative for chills and fever.  HENT: Negative.   Eyes: Negative.   Respiratory: Negative.   Gastrointestinal: Positive for nausea.  Genitourinary: Positive for flank pain.  Skin: Negative.   Neurological: Negative.   Endo/Heme/Allergies: Negative.   Psychiatric/Behavioral: Negative.     There were no vitals taken for this visit. Physical Exam  Constitutional: He appears well-developed.  HENT:  Head: Normocephalic.  Eyes: Pupils are equal, round, and reactive to light.  Neck: Normal range of motion.  Cardiovascular: Normal rate.   Respiratory: Effort normal.  GI: Soft.  Genitourinary: Penis normal.  Genitourinary Comments: Mild left CVAT at present.   Musculoskeletal: Normal range of motion.  Neurological: He is alert.  Skin: Skin is warm.  Psychiatric: He has a normal mood and affect. His behavior is normal.     Assessment/Plan  Proceed as planned with LEFT ureteral stent placment. Goal today is renal decompression in acute setting, then likely left ureteroscopy v. SWL with goal of stone free in few weeks. Risks, benefits,  alternatives, expcected peri-op course discussed.   Alexis Frock, MD 12/21/2016, 1:27 PM

## 2016-12-22 ENCOUNTER — Encounter (HOSPITAL_COMMUNITY): Payer: Self-pay | Admitting: Urology

## 2016-12-22 NOTE — Op Note (Signed)
NAME:  Jose [name], Jose [name]                  ACCOUNT NO.:  MEDICAL RECORD NO.:  [id number]  LOCATION:                                 FACILITY:  PHYSICIAN:  Alexis Frock, MD          DATE OF BIRTH:  DATE OF PROCEDURE: 12/21/2016                              OPERATIVE REPORT   PREOPERATIVE DIAGNOSIS:  Left large proximal ureteral stone with refractory colic.  PROCEDURES: 1. Cystoscopy with left retrograde pyelogram and interpretation. 2. Left ureteral stent placement, 6 x 26 Contour, no tether.  ESTIMATED BLOOD LOSS:  Nil.  COMPLICATIONS:  None.  SPECIMEN:  None.  FINDINGS: 1. Filling defect in proximal ureter, consistent with known stone with     mild-to-moderate hydronephrosis above this. 2. Successful placement of left ureteral stent, proximal in upper     pole, distal in urinary bladder.  INDICATIONS:  Jose [name] is a pleasant, 76 year old gentleman with history of recurrent nephrolithiasis.  He was found on workup of colicky left flank pain to have a large left UPJ stone by axial imaging earlier today.  He had refractory colic with nausea and pain of difficult-to- control on oral medicines.  Options were discussed.  We will proceed with ureteral stenting in the similar acute setting for temporizing, also I believe definitive management with shockwave lithotripsy versus ureteroscopy in the elective setting.  Informed consent was obtained and placed in the medical record.  DESCRIPTION OF PROCEDURE:  The patient being Jose [name] verified, procedure being cysto and temporizing confirmed.  Procedure was carried out.  Time-out was performed.  Intravenous antibiotics were administered.  General endotracheal anesthesia introduced.  The patient was placed into a low lithotomy position.  Sterile field was created by prepping and draping the patient's penis, perineum, proximal thighs using iodine.  Next, cystourethroscopy was performed using a rigid cystoscope with offset  lens.  Inspection of the anterior and posterior urethra unremarkable.  Inspection of the urinary bladder revealed no diverticula, calcifications, papillary lesions.  Ureteral orifices appeared singleton bilaterally.  The left ureteral orifice was cannulated with a 6-French end-hole catheter and left retrograde pyelogram was obtained.  Left retrograde pyelogram demonstrates a single left ureter with single- system left kidney.  There is a filling defect in the proximal ureter consistent with known stone with moderate hydronephrosis above this.  A 0.038 Zip wire was advanced to the level of the upper pole, over which a new 6 x 26 Contour-type stent was placed.  Good proximal and distal deployment were noted.  Efflux of urine was seen around into the distal end of the stent.  Bladder was partially emptied per cystoscope. Procedure was terminated.  The patient tolerated the procedure well. There were no immediate periprocedural complications.  The patient was taken to the postanesthesia care unit in stable condition.          ______________________________ Alexis Frock, MD     TM/MEDQ  D:  12/21/2016  T:  12/21/2016  Job:  [id number]

## 2016-12-27 ENCOUNTER — Other Ambulatory Visit: Payer: Self-pay | Admitting: Urology

## 2016-12-27 DIAGNOSIS — N201 Calculus of ureter: Secondary | ICD-10-CM | POA: Diagnosis not present

## 2016-12-28 ENCOUNTER — Encounter (HOSPITAL_COMMUNITY): Payer: Self-pay | Admitting: *Deleted

## 2016-12-29 NOTE — H&P (Signed)
Office Visit Report     12/27/2016   --------------------------------------------------------------------------------   Jose [name]  MRN: [id number]  PRIMARY CARE:  Euharlee Melanee Spry, MD  DOB: 04-Apr-1941, 76 year old Male  REFERRING:  Jimmey Ralph, NP   PROVIDER:  Festus Aloe, M.D.    TREATING:  Louis Meckel, M.D.    LOCATION:  [facility] [phone]   --------------------------------------------------------------------------------   CC: f/u for obstructing stone  HPI: Jose [name] is a 76 year-old male established patient who is here for further eval and management of an obstructing stone.  The patient was last seen 12/21/16.   The patient's stone is on his left side. The stone was 41mm - proximal left ureter. There are additional stones within the urinary tract. They are located small non-obstructing stone in the left kidney.   The patient has not passed their stone since his visit. The patient is complaining of hematuria. The patient underwent KUB prior to today's appointment. The patient has been taking Toradol and tramadol for their obstructing stone.   Patient underwent stent placement on 4/24 urgently by Dr. Tresa Moore. Presents today for further discussion of manangement options.     ALLERGIES: No Allergies    MEDICATIONS: Cialis  Aspir 81 81 mg tablet, delayed release Oral  Crestor 5 mg tablet Oral  Omeprazole 20 mg capsule,delayed release Oral  Tylenol Pm Extra Strength 1 PO Q PM     GU PSH: Cystoscopy Insert Stent, Left - 12/21/2016 ESWL      PSH Notes: Removed basil cell carcinoma behind ear.     NON-GU PSH: Cataract surgery - 12/22/2015 Tonsillectomy.. - 2015    GU PMH: Flank Pain (Acute), Left, Morphine 8 mg IM today. Promethazine 25 mg IM today. - 12/21/2016 Renal cyst (Stable, Chronic), Left, Appears unchanged from previous CT. - 12/21/2016 Other microscopic hematuria, Microscopic hematuria - 2015 Ureteral calculus,  Left ureteral calculus - 2015    NON-GU PMH: Encounter for general adult medical examination without abnormal findings, Encounter for preventive health examination - 2015 Myocardial Infarction, History of myocardial infarction - 2015 Personal history of other diseases of the circulatory system, History of cardiac murmur - 2015 Personal history of other diseases of the digestive system, History of esophageal reflux - 2015 Skin Cancer, History, History of basal cell carcinoma - 2015, History of squamous cell carcinoma of skin, - 2015 Heartburn    FAMILY HISTORY: cardiac disorder - Runs In Family Cirrhosis, alcoholic - Runs In Family Kidney Stones - Other mother deceased at age 98 - Other Myocardial Infarction - Runs In Family Patient's father is deceased - Other    Notes: Father deceased at age 39   SOCIAL HISTORY: Marital Status: Married Current Smoking Status: Patient does not smoke anymore. Has not smoked since 11/29/1986. Smoked for 30 years. Smoked 1/2 pack per day.   Tobacco Use Assessment Completed: Used Tobacco in last 30 days? Social Drinker.  Drinks 3 caffeinated drinks per day.    REVIEW OF SYSTEMS:    GU Review Male:   Patient reports burning/ pain with urination. Patient denies hard to postpone urination, erection problems, leakage of urine, stream starts and stops, penile pain, frequent urination, have to strain to urinate , get up at night to urinate, and trouble starting your stream.  Gastrointestinal (Upper):   Patient denies nausea, vomiting, and indigestion/ heartburn.  Gastrointestinal (Lower):   Patient denies diarrhea and constipation.  Constitutional:   Patient denies fever, night sweats, weight loss,  and fatigue.  Skin:   Patient denies skin rash/ lesion and itching.  Eyes:   Patient denies blurred vision and double vision.  Ears/ Nose/ Throat:   Patient denies sore throat and sinus problems.  Hematologic/Lymphatic:   Patient denies swollen glands and easy  bruising.  Cardiovascular:   Patient denies leg swelling and chest pains.  Respiratory:   Patient denies cough and shortness of breath.  Endocrine:   Patient denies excessive thirst.  Musculoskeletal:   Patient denies back pain and joint pain.  Neurological:   Patient denies headaches and dizziness.  Psychologic:   Patient denies depression and anxiety.   VITAL SIGNS:      12/27/2016 02:21 PM  BP 124/85 mmHg  Pulse 99 /min  Temperature 98.2 F / 37 C   MULTI-SYSTEM PHYSICAL EXAMINATION:    Constitutional: Well-nourished. No physical deformities. Normally developed. Good grooming.  Neck: Neck symmetrical, not swollen. Normal tracheal position.  Respiratory: No labored breathing, no use of accessory muscles. Clear to auscultation bilaterally  Cardiovascular: Normal temperature, normal extremity pulses, no swelling, no varicosities. Regular rate and rhythm  Lymphatic: No enlargement of neck, axillae, groin.  Skin: No paleness, no jaundice, no cyanosis. No lesion, no ulcer, no rash.  Neurologic / Psychiatric: Oriented to time, oriented to place, oriented to person. No depression, no anxiety, no agitation.  Gastrointestinal: No mass, no tenderness, no rigidity, non obese abdomen.  Eyes: Normal conjunctivae. Normal eyelids.  Ears, Nose, Mouth, and Throat: Left ear no scars, no lesions, no masses. Right ear no scars, no lesions, no masses. Nose no scars, no lesions, no masses. Normal hearing. Normal lips.  Musculoskeletal: Normal gait and station of head and neck.     PAST DATA REVIEWED:  Source Of History:  Patient   07/16/05  PSA  Total PSA 1.45     07/16/05 01/18/05  Hormones  Testosterone, Total 4.55  4.96     PROCEDURES:         KUB - [id number]  A single view of the abdomen is obtained. Renal shadows are easily visualized bilaterally. The stent is well positioned in the left ureter. There is a stone or calcification the proximal left ureter. There are no additional calcifications  along the expected location of either ureter bilaterally.  Gas pattern is grossly normal. No significant bony abnormalities.      Impression: Stent in appropriate position in the left ureter. Large proximal ureteral stone as seen on CT scan.         Urinalysis w/Scope Dipstick Dipstick Cont'd Micro  Color: Red Bilirubin: Neg WBC/hpf: 6 - 10/hpf  Appearance: Cloudy Ketones: Neg RBC/hpf: 40 - 60/hpf  Specific Gravity: 1.015 Blood: 3+ Bacteria: NS (Not Seen)  pH: 5.5 Protein: 2+ Cystals: Amorph Urates  Glucose: Neg Urobilinogen: 0.2 Casts: NS (Not Seen)    Nitrites: Neg Trichomonas: Not Present    Leukocyte Esterase: 1+ Mucous: Not Present      Epithelial Cells: NS (Not Seen)      Yeast: NS (Not Seen)      Sperm: Not Present    ASSESSMENT:      ICD-10 Details  1 GU:   Ureteral calculus - N20.1    PLAN:           Orders Labs Urine Culture          Schedule Return Visit/Planned Activity: ASAP - Schedule Surgery          Document Letter(s):  Created for Patient: Clinical Summary  We discussed management options including medical expulsion therapy, shockwave lithotripsy, and ureteroscopy. Ultimately, the patient has opted for shock wave lithotripsy. I discussed with the patient the procedure in detail as well as the risk and benefits. The patient is aware that she may need additional procedures. She also is aware of the risks of hematoma and pain. We will try to get this patient's scheduled as soon as possible.         Notes:   After discussing all the treatment options, the patient has opted for shock wave lithotripsy. He understands that there is a chance that he will need a second procedure, and that all the stone fragments may not pass. He is stented, we'll plan to remove the stent one to 2 days after shockwave lithotripsy. I ensured that the patient would stop his Toradol today and resume tramadol for pain. I encouraged him to drink plenty water. I sent a urine culture.    Cc: Dr. [name], M.D.    Signed by Louis Meckel, M.D. on 12/27/16 at 2:47 PM (EDT)     The information contained in this medical record document is considered private and confidential patient information. This information can only be used for the medical diagnosis and/or medical services that are being provided by the patient's selected caregivers. This information can only be distributed outside of the patient's care if the patient agrees and signs waivers of authorization for this information to be sent to an outside source or route.

## 2016-12-30 ENCOUNTER — Encounter (HOSPITAL_COMMUNITY): Payer: Self-pay | Admitting: *Deleted

## 2016-12-30 ENCOUNTER — Encounter (HOSPITAL_COMMUNITY)
Admission: RE | Disposition: A | Discharge status: Home / Self Care | Payer: Self-pay | Source: Ambulatory Visit | Attending: Urology

## 2016-12-30 ENCOUNTER — Ambulatory Visit: Payer: Medicare Other | Admitting: Family Medicine

## 2016-12-30 ENCOUNTER — Ambulatory Visit (HOSPITAL_COMMUNITY): Payer: Medicare Other

## 2016-12-30 ENCOUNTER — Ambulatory Visit (HOSPITAL_COMMUNITY)
Admission: RE | Admit: 2016-12-30 | Discharge: 2016-12-30 | Disposition: A | Discharge status: Home / Self Care | Payer: Medicare Other | Source: Ambulatory Visit | Attending: Urology | Admitting: Urology

## 2016-12-30 DIAGNOSIS — Z79899 Other long term (current) drug therapy: Secondary | ICD-10-CM | POA: Diagnosis not present

## 2016-12-30 DIAGNOSIS — Z7982 Long term (current) use of aspirin: Secondary | ICD-10-CM | POA: Insufficient documentation

## 2016-12-30 DIAGNOSIS — Z87891 Personal history of nicotine dependence: Secondary | ICD-10-CM | POA: Diagnosis not present

## 2016-12-30 DIAGNOSIS — N201 Calculus of ureter: Secondary | ICD-10-CM | POA: Diagnosis not present

## 2016-12-30 DIAGNOSIS — N200 Calculus of kidney: Secondary | ICD-10-CM | POA: Diagnosis not present

## 2016-12-30 HISTORY — PX: EXTRACORPOREAL SHOCK WAVE LITHOTRIPSY: SHX1557

## 2016-12-30 HISTORY — DX: Personal history of urinary calculi: Z87.442

## 2016-12-30 HISTORY — DX: Anesthesia of skin: R20.0

## 2016-12-30 SURGERY — LITHOTRIPSY, ESWL
Anesthesia: LOCAL | Laterality: Left

## 2016-12-30 MED ORDER — DIAZEPAM 5 MG PO TABS
10.0000 mg | ORAL_TABLET | ORAL | Status: AC
  Administered 2016-12-30: 10 mg via ORAL
  Filled 2016-12-30: qty 2

## 2016-12-30 MED ORDER — CIPROFLOXACIN HCL 500 MG PO TABS
500.0000 mg | ORAL_TABLET | ORAL | Status: AC
  Administered 2016-12-30: 500 mg via ORAL
  Filled 2016-12-30: qty 1

## 2016-12-30 MED ORDER — DIPHENHYDRAMINE HCL 25 MG PO CAPS
25.0000 mg | ORAL_CAPSULE | ORAL | Status: AC
  Administered 2016-12-30: 25 mg via ORAL
  Filled 2016-12-30: qty 1

## 2016-12-30 MED ORDER — SODIUM CHLORIDE 0.9 % IV SOLN
INTRAVENOUS | Status: DC
  Administered 2016-12-30: 08:00:00 via INTRAVENOUS

## 2016-12-30 NOTE — Discharge Instructions (Signed)

## 2016-12-30 NOTE — Interval H&P Note (Signed)
History and Physical Interval Note:  12/30/2016 8:51 AM  [name]  has presented today for surgery, with the diagnosis of LEFT URETERAL STONE  The various methods of treatment have been discussed with the patient and family. After consideration of risks, benefits and other options for treatment, the patient has consented to  Procedure(s): LEFT EXTRACORPOREAL SHOCK WAVE LITHOTRIPSY (ESWL) (Left) as a surgical intervention .  The patient's history has been reviewed, patient examined, no change in status, stable for surgery.  I have reviewed the patient's chart and labs.  Questions were answered to the patient's satisfaction.     Debara Kamphuis I Garek Schuneman

## 2016-12-31 ENCOUNTER — Encounter (HOSPITAL_COMMUNITY): Payer: Self-pay | Admitting: Urology

## 2017-01-10 DIAGNOSIS — N201 Calculus of ureter: Secondary | ICD-10-CM | POA: Diagnosis not present

## 2017-01-14 DIAGNOSIS — N201 Calculus of ureter: Secondary | ICD-10-CM | POA: Diagnosis not present

## 2017-01-29 NOTE — Addendum Note (Signed)
Addendum  created 01/29/17 0841 by Duane Boston, MD   Sign clinical note

## 2017-03-05 ENCOUNTER — Other Ambulatory Visit: Payer: Self-pay | Admitting: Family Medicine

## 2017-03-24 DIAGNOSIS — I10 Essential (primary) hypertension: Secondary | ICD-10-CM | POA: Diagnosis not present

## 2017-03-24 DIAGNOSIS — I25119 Atherosclerotic heart disease of native coronary artery with unspecified angina pectoris: Secondary | ICD-10-CM | POA: Diagnosis not present

## 2017-03-24 DIAGNOSIS — E782 Mixed hyperlipidemia: Secondary | ICD-10-CM | POA: Diagnosis not present

## 2017-04-19 DIAGNOSIS — Z961 Presence of intraocular lens: Secondary | ICD-10-CM | POA: Diagnosis not present

## 2017-04-19 DIAGNOSIS — D3132 Benign neoplasm of left choroid: Secondary | ICD-10-CM | POA: Diagnosis not present

## 2017-06-08 ENCOUNTER — Other Ambulatory Visit: Payer: Self-pay | Admitting: Family Medicine

## 2017-06-08 NOTE — Telephone Encounter (Signed)
Okay to refill Cialis? Last seen 07/2016

## 2017-07-13 DIAGNOSIS — M1712 Unilateral primary osteoarthritis, left knee: Secondary | ICD-10-CM | POA: Diagnosis not present

## 2017-08-01 ENCOUNTER — Encounter: Payer: Self-pay | Admitting: Gastroenterology

## 2017-08-01 ENCOUNTER — Telehealth: Payer: Self-pay | Admitting: Family Medicine

## 2017-08-01 NOTE — Telephone Encounter (Signed)
I have no issue with transfer. I thought Dr. Erick Blinks panel was full though? Shouldn't patient be referred to [facility], Dr. Volanda Napoleon or Dr. Martinique?

## 2017-08-01 NOTE — Telephone Encounter (Signed)
I agree with Dr Ansel Bong note.  We should try to accommodate him but would be much better to try to get in to see provider still building panel of patients.

## 2017-08-01 NOTE — Telephone Encounter (Signed)
Copied from Jose [name]. Topic: Appointment Scheduling - Scheduling Inquiry for Clinic >> Aug 01, 2017  3:42 PM Jose [name], NT wrote: Reason for CRM: patient is a former patient of Dr. Yong [name], he would like to stay in the [place] office and states he has seen Jose [name] in the past. He is wanting to know if he can establish care with Jose [name]. Please contact patient if willing to accept.  I left a voice message for pt to call and schedule with a provider that is taking new patients.

## 2017-08-02 NOTE — Telephone Encounter (Signed)
Attempted to call patient, but the voicemail was full and unable to leave a message.

## 2017-08-05 NOTE — Telephone Encounter (Signed)
Left message on machine for patient to return our call 

## 2017-08-17 ENCOUNTER — Other Ambulatory Visit: Payer: Self-pay

## 2017-08-17 ENCOUNTER — Ambulatory Visit (AMBULATORY_SURGERY_CENTER): Payer: Self-pay | Admitting: *Deleted

## 2017-08-17 VITALS — Ht 70.0 in | Wt 210.0 lb

## 2017-08-17 DIAGNOSIS — K2270 Barrett's esophagus without dysplasia: Secondary | ICD-10-CM

## 2017-08-17 NOTE — Progress Notes (Signed)
Patient denies any allergies to eggs or soy. Patient denies any problems with anesthesia/sedation. Patient denies any oxygen use at home. Patient denies taking any diet/weight loss medications or blood thinners. EMMI education declined by pt.  

## 2017-09-06 DIAGNOSIS — M1712 Unilateral primary osteoarthritis, left knee: Secondary | ICD-10-CM | POA: Diagnosis not present

## 2017-09-06 DIAGNOSIS — Z23 Encounter for immunization: Secondary | ICD-10-CM | POA: Diagnosis not present

## 2017-09-12 DIAGNOSIS — M1712 Unilateral primary osteoarthritis, left knee: Secondary | ICD-10-CM | POA: Diagnosis not present

## 2017-09-16 ENCOUNTER — Encounter: Payer: Medicare Other | Admitting: Gastroenterology

## 2017-09-20 DIAGNOSIS — M1712 Unilateral primary osteoarthritis, left knee: Secondary | ICD-10-CM | POA: Diagnosis not present

## 2017-10-06 DIAGNOSIS — M1712 Unilateral primary osteoarthritis, left knee: Secondary | ICD-10-CM | POA: Diagnosis not present

## 2017-10-21 ENCOUNTER — Other Ambulatory Visit: Payer: Self-pay

## 2017-10-21 ENCOUNTER — Encounter: Payer: Self-pay | Admitting: Gastroenterology

## 2017-10-21 ENCOUNTER — Ambulatory Visit (AMBULATORY_SURGERY_CENTER): Payer: Medicare Other | Admitting: Gastroenterology

## 2017-10-21 VITALS — BP 138/70 | HR 51 | Temp 98.9°F | Resp 18 | Ht 70.0 in | Wt 210.0 lb

## 2017-10-21 DIAGNOSIS — R1310 Dysphagia, unspecified: Secondary | ICD-10-CM | POA: Diagnosis not present

## 2017-10-21 DIAGNOSIS — K2270 Barrett's esophagus without dysplasia: Secondary | ICD-10-CM | POA: Diagnosis not present

## 2017-10-21 MED ORDER — SODIUM CHLORIDE 0.9 % IV SOLN: 500.0000 mL | Freq: Once | INTRAVENOUS | Status: DC

## 2017-10-21 NOTE — Progress Notes (Signed)
A and O x3. Report to RN. Tolerated MAC anesthesia well.Teeth unchanged after procedure.

## 2017-10-21 NOTE — Progress Notes (Signed)
Pt's states no medical or surgical changes since previsit or office visit. 

## 2017-10-21 NOTE — Progress Notes (Signed)
Called to room to assist during endoscopic procedure.  Patient ID and intended procedure confirmed with present staff. Received instructions for my participation in the procedure from the performing physician.  

## 2017-10-21 NOTE — Op Note (Signed)
[facility] Patient Name: Pierson Vantol Procedure Date: 10/21/2017 1:27 PM MRN: [id number] Endoscopist: Ladene Artist , MD Age: 77 Referring MD:  Date of Birth: [birth year] Gender: Male Account #: [id number] Procedure:                Upper GI endoscopy Indications:              Surveillance for malignancy due to personal history                            of Barrett's esophagus, Dysphagia Medicines:                Monitored Anesthesia Care Procedure:                Pre-Anesthesia Assessment:                           - Prior to the procedure, a History and Physical                            was performed, and patient medications and                            allergies were reviewed. The patient's tolerance of                            previous anesthesia was also reviewed. The risks                            and benefits of the procedure and the sedation                            options and risks were discussed with the patient.                            All questions were answered, and informed consent                            was obtained. Prior Anticoagulants: The patient has                            taken no previous anticoagulant or antiplatelet                            agents. ASA Grade Assessment: II - A patient with                            mild systemic disease. After reviewing the risks                            and benefits, the patient was deemed in                            satisfactory condition to undergo the procedure.  After obtaining informed consent, the endoscope was                            passed under direct vision. Throughout the                            procedure, the patient's blood pressure, pulse, and                            oxygen saturations were monitored continuously. The                            Endoscope was introduced through the mouth, and                            advanced to the  second part of duodenum. The upper                            GI endoscopy was accomplished without difficulty.                            The patient tolerated the procedure well. Scope In: Scope Out: Findings:                 There were esophageal mucosal changes secondary to                            established long-segment Barrett's disease present                            in the distal esophagus. The maximum longitudinal                            extent of these mucosal changes was 4 cm in length.                            Mucosa was biopsied with a cold forceps for                            histology in 4 quadrants at intervals of 1.5 cm in                            the lower third of the esophagus. One specimen                            bottle was sent to pathology.                           No endoscopic abnormality was evident in the                            esophagus to explain the patient's complaint of  dysphagia. It was decided, however, to proceed with                            dilation of the entire esophagus. A guidewire was                            placed and the scope was withdrawn. Dilation was                            performed with a Savary dilator with mild                            resistance at 16 mm. Estimated blood loss was                            minimal.                           The exam of the esophagus was otherwise normal.                           A small hiatal hernia was present.                           The exam of the stomach was otherwise normal.                           The duodenal bulb and second portion of the                            duodenum were normal. Complications:            No immediate complications. Estimated Blood Loss:     Estimated blood loss was minimal. Impression:               - Esophageal mucosal changes secondary to                            established long-segment Barrett's  disease.                            Biopsied.                           - No endoscopic esophageal abnormality to explain                            patient's dysphagia. Esophagus dilated. Dilated.                           - Small hiatal hernia.                           - Normal duodenal bulb and second portion of the  duodenum. Recommendation:           - Patient has a contact number available for                            emergencies. The signs and symptoms of potential                            delayed complications were discussed with the                            patient. Return to normal activities tomorrow.                            Written discharge instructions were provided to the                            patient.                           - Clear liquid diet for 2 hours, then advance as                            tolerated to soft diet today.                           - Continue present medications.                           - Await pathology results.                           - Repeat upper endoscopy in 3 years for                            surveillance if no dysplasia. Ladene Artist, MD 10/21/2017 1:46:18 PM This report has been signed electronically.

## 2017-10-21 NOTE — Patient Instructions (Signed)
YOU HAD AN ENDOSCOPIC PROCEDURE TODAY AT [facility]:   Refer to the procedure report that was given to you for any specific questions about what was found during the examination.  If the procedure report does not answer your questions, please call your gastroenterologist to clarify.  If you requested that your care partner not be given the details of your procedure findings, then the procedure report has been included in a sealed envelope for you to review at your convenience later.  YOU SHOULD EXPECT: Some feelings of bloating in the abdomen. Passage of more gas than usual.  Walking can help get rid of the air that was put into your GI tract during the procedure and reduce the bloating. If you had a lower endoscopy (such as a colonoscopy or flexible sigmoidoscopy) you may notice spotting of blood in your stool or on the toilet paper. If you underwent a bowel prep for your procedure, you may not have a normal bowel movement for a few days.  Please Note:  You might notice some irritation and congestion in your nose or some drainage.  This is from the oxygen used during your procedure.  There is no need for concern and it should clear up in a day or so.  SYMPTOMS TO REPORT IMMEDIATELY:    Following upper endoscopy (EGD)  Vomiting of blood or coffee ground material  New chest pain or pain under the shoulder blades  Painful or persistently difficult swallowing  New shortness of breath  Fever of 100F or higher  Black, tarry-looking stools  For urgent or emergent issues, a gastroenterologist can be reached at any hour by calling [phone].   DIET:  We do recommend clear liquid until 3:30pm today. You will need to stay on a soft diet for the rest of today. Regular diet tomorrow, please.  Drink plenty of fluids but you should avoid alcoholic beverages for 24 hours.  ACTIVITY:  You should plan to take it easy for the rest of today and you should NOT DRIVE or use heavy machinery  until tomorrow (because of the sedation medicines used during the test).    FOLLOW UP: Our staff will call the number listed on your records the next business day following your procedure to check on you and address any questions or concerns that you may have regarding the information given to you following your procedure. If we do not reach you, we will leave a message.  However, if you are feeling well and you are not experiencing any problems, there is no need to return our call.  We will assume that you have returned to your regular daily activities without incident.  If any biopsies were taken you will be contacted by phone or by letter within the next 1-3 weeks.  Please call us at [phone] if you have not heard about the biopsies in 3 weeks.    SIGNATURES/CONFIDENTIALITY: You and/or your care partner have signed paperwork which will be entered into your electronic medical record.  These signatures attest to the fact that that the information above on your After Visit Summary has been reviewed and is understood.  Full responsibility of the confidentiality of this discharge information lies with you and/or your care-partner.  You will need another endoscopy in 3 years.

## 2017-10-24 ENCOUNTER — Telehealth: Payer: Self-pay

## 2017-10-24 NOTE — Telephone Encounter (Signed)
Called [phone] and call went to voice mail.  A recording stated, "the mail box is full and can not accept any messages".  maw

## 2017-10-24 NOTE — Telephone Encounter (Signed)
  Follow up Call-  Call back number 10/21/2017  Post procedure Call Back phone  # [phone]  Permission to leave phone message Yes  Some recent data might be hidden     Patient questions:  Do you have a fever, pain , or abdominal swelling? No. Pain Score  0 *  Have you tolerated food without any problems? Yes.    Have you been able to return to your normal activities? Yes.    Do you have any questions about your discharge instructions: Diet   No. Medications  No. Follow up visit  No.  Do you have questions or concerns about your Care? No.  Actions: * If pain score is 4 or above: No action needed, pain <4.

## 2017-10-29 ENCOUNTER — Encounter: Payer: Self-pay | Admitting: Gastroenterology

## 2017-11-02 DIAGNOSIS — Z789 Other specified health status: Secondary | ICD-10-CM | POA: Diagnosis not present

## 2017-11-02 DIAGNOSIS — M21162 Varus deformity, not elsewhere classified, left knee: Secondary | ICD-10-CM | POA: Diagnosis not present

## 2017-11-02 DIAGNOSIS — M1712 Unilateral primary osteoarthritis, left knee: Secondary | ICD-10-CM | POA: Diagnosis not present

## 2017-11-02 DIAGNOSIS — R269 Unspecified abnormalities of gait and mobility: Secondary | ICD-10-CM | POA: Diagnosis not present

## 2017-11-02 DIAGNOSIS — M7122 Synovial cyst of popliteal space [Baker], left knee: Secondary | ICD-10-CM | POA: Diagnosis not present

## 2017-11-02 DIAGNOSIS — M7120 Synovial cyst of popliteal space [Baker], unspecified knee: Secondary | ICD-10-CM | POA: Diagnosis not present

## 2017-11-02 DIAGNOSIS — M25662 Stiffness of left knee, not elsewhere classified: Secondary | ICD-10-CM | POA: Diagnosis not present

## 2017-11-02 DIAGNOSIS — M25562 Pain in left knee: Secondary | ICD-10-CM | POA: Diagnosis not present

## 2017-11-02 DIAGNOSIS — M25462 Effusion, left knee: Secondary | ICD-10-CM | POA: Diagnosis not present

## 2017-12-08 ENCOUNTER — Other Ambulatory Visit: Payer: Self-pay

## 2017-12-08 ENCOUNTER — Emergency Department (HOSPITAL_COMMUNITY): Payer: Medicare Other

## 2017-12-08 ENCOUNTER — Emergency Department (HOSPITAL_COMMUNITY)
Admission: EM | Admit: 2017-12-08 | Discharge: 2017-12-08 | Disposition: A | Discharge status: Home / Self Care | Payer: Medicare Other | Attending: Emergency Medicine | Admitting: Emergency Medicine

## 2017-12-08 ENCOUNTER — Encounter (HOSPITAL_COMMUNITY): Payer: Self-pay | Admitting: *Deleted

## 2017-12-08 DIAGNOSIS — Y929 Unspecified place or not applicable: Secondary | ICD-10-CM | POA: Diagnosis not present

## 2017-12-08 DIAGNOSIS — Y998 Other external cause status: Secondary | ICD-10-CM | POA: Diagnosis not present

## 2017-12-08 DIAGNOSIS — I2510 Atherosclerotic heart disease of native coronary artery without angina pectoris: Secondary | ICD-10-CM | POA: Insufficient documentation

## 2017-12-08 DIAGNOSIS — S63289A Dislocation of proximal interphalangeal joint of unspecified finger, initial encounter: Secondary | ICD-10-CM | POA: Diagnosis not present

## 2017-12-08 DIAGNOSIS — S52502A Unspecified fracture of the lower end of left radius, initial encounter for closed fracture: Secondary | ICD-10-CM | POA: Diagnosis not present

## 2017-12-08 DIAGNOSIS — S199XXA Unspecified injury of neck, initial encounter: Secondary | ICD-10-CM | POA: Diagnosis not present

## 2017-12-08 DIAGNOSIS — S0219XA Other fracture of base of skull, initial encounter for closed fracture: Secondary | ICD-10-CM | POA: Diagnosis not present

## 2017-12-08 DIAGNOSIS — W108XXA Fall (on) (from) other stairs and steps, initial encounter: Secondary | ICD-10-CM | POA: Diagnosis not present

## 2017-12-08 DIAGNOSIS — R042 Hemoptysis: Secondary | ICD-10-CM | POA: Diagnosis not present

## 2017-12-08 DIAGNOSIS — S0280XA Fracture of other specified skull and facial bones, unspecified side, initial encounter for closed fracture: Secondary | ICD-10-CM | POA: Insufficient documentation

## 2017-12-08 DIAGNOSIS — R51 Headache: Secondary | ICD-10-CM | POA: Diagnosis not present

## 2017-12-08 DIAGNOSIS — S52552A Other extraarticular fracture of lower end of left radius, initial encounter for closed fracture: Secondary | ICD-10-CM

## 2017-12-08 DIAGNOSIS — S0083XA Contusion of other part of head, initial encounter: Secondary | ICD-10-CM | POA: Diagnosis not present

## 2017-12-08 DIAGNOSIS — S61401A Unspecified open wound of right hand, initial encounter: Secondary | ICD-10-CM | POA: Diagnosis not present

## 2017-12-08 DIAGNOSIS — S63282A Dislocation of proximal interphalangeal joint of right middle finger, initial encounter: Secondary | ICD-10-CM | POA: Diagnosis not present

## 2017-12-08 DIAGNOSIS — S62624A Displaced fracture of medial phalanx of right ring finger, initial encounter for closed fracture: Secondary | ICD-10-CM | POA: Diagnosis not present

## 2017-12-08 DIAGNOSIS — Z7982 Long term (current) use of aspirin: Secondary | ICD-10-CM | POA: Diagnosis not present

## 2017-12-08 DIAGNOSIS — S62664A Nondisplaced fracture of distal phalanx of right ring finger, initial encounter for closed fracture: Secondary | ICD-10-CM | POA: Diagnosis not present

## 2017-12-08 DIAGNOSIS — S62634A Displaced fracture of distal phalanx of right ring finger, initial encounter for closed fracture: Secondary | ICD-10-CM | POA: Diagnosis not present

## 2017-12-08 DIAGNOSIS — Y939 Activity, unspecified: Secondary | ICD-10-CM | POA: Insufficient documentation

## 2017-12-08 DIAGNOSIS — S0990XA Unspecified injury of head, initial encounter: Secondary | ICD-10-CM | POA: Diagnosis not present

## 2017-12-08 DIAGNOSIS — S0285XA Fracture of orbit, unspecified, initial encounter for closed fracture: Secondary | ICD-10-CM

## 2017-12-08 DIAGNOSIS — Z87891 Personal history of nicotine dependence: Secondary | ICD-10-CM | POA: Insufficient documentation

## 2017-12-08 DIAGNOSIS — S0993XA Unspecified injury of face, initial encounter: Secondary | ICD-10-CM | POA: Diagnosis not present

## 2017-12-08 LAB — CBC WITH DIFFERENTIAL/PLATELET
BASOS ABS: 0 10*3/uL (ref 0.0–0.1)
BASOS PCT: 0 %
EOS PCT: 1 %
Eosinophils Absolute: 0 10*3/uL (ref 0.0–0.7)
HEMATOCRIT: 45.3 % (ref 39.0–52.0)
Hemoglobin: 15.1 g/dL (ref 13.0–17.0)
LYMPHS PCT: 12 %
Lymphs Abs: 1 10*3/uL (ref 0.7–4.0)
MCH: 28.9 pg (ref 26.0–34.0)
MCHC: 33.3 g/dL (ref 30.0–36.0)
MCV: 86.6 fL (ref 78.0–100.0)
Monocytes Absolute: 1 10*3/uL (ref 0.1–1.0)
Monocytes Relative: 12 %
Neutro Abs: 6.3 10*3/uL (ref 1.7–7.7)
Neutrophils Relative %: 75 %
PLATELETS: 201 10*3/uL (ref 150–400)
RBC: 5.23 MIL/uL (ref 4.22–5.81)
RDW: 13.1 % (ref 11.5–15.5)
WBC: 8.2 10*3/uL (ref 4.0–10.5)

## 2017-12-08 LAB — BASIC METABOLIC PANEL
ANION GAP: 10 (ref 5–15)
BUN: 13 mg/dL (ref 6–20)
CO2: 24 mmol/L (ref 22–32)
Calcium: 9.3 mg/dL (ref 8.9–10.3)
Chloride: 105 mmol/L (ref 101–111)
Creatinine, Ser: 0.98 mg/dL (ref 0.61–1.24)
GLUCOSE: 103 mg/dL — AB (ref 65–99)
POTASSIUM: 4.3 mmol/L (ref 3.5–5.1)
Sodium: 139 mmol/L (ref 135–145)

## 2017-12-08 MED ORDER — FENTANYL CITRATE (PF) 100 MCG/2ML IJ SOLN
50.0000 ug | Freq: Once | INTRAMUSCULAR | Status: DC
  Filled 2017-12-08: qty 2

## 2017-12-08 MED ORDER — FENTANYL CITRATE (PF) 100 MCG/2ML IJ SOLN: 100.0000 ug | Freq: Once | INTRAMUSCULAR | Status: DC

## 2017-12-08 MED ORDER — IOPAMIDOL (ISOVUE-370) INJECTION 76%
100.0000 mL | Freq: Once | INTRAVENOUS | Status: AC | PRN
  Administered 2017-12-08: 100 mL via INTRAVENOUS

## 2017-12-08 MED ORDER — LIDOCAINE HCL (PF) 1 % IJ SOLN
10.0000 mL | Freq: Once | INTRAMUSCULAR | Status: AC
  Administered 2017-12-08: 10 mL via INTRADERMAL
  Filled 2017-12-08: qty 10

## 2017-12-08 MED ORDER — FENTANYL CITRATE (PF) 100 MCG/2ML IJ SOLN
25.0000 ug | Freq: Once | INTRAMUSCULAR | Status: AC
  Administered 2017-12-08: 25 ug via INTRAVENOUS
  Filled 2017-12-08: qty 2

## 2017-12-08 MED ORDER — IOPAMIDOL (ISOVUE-370) INJECTION 76%
INTRAVENOUS | Status: AC
  Filled 2017-12-08: qty 100

## 2017-12-08 MED ORDER — BACITRACIN ZINC 500 UNIT/GM EX OINT
TOPICAL_OINTMENT | Freq: Once | CUTANEOUS | Status: AC
  Administered 2017-12-08: 16:00:00 via TOPICAL

## 2017-12-08 MED ORDER — OXYCODONE-ACETAMINOPHEN 5-325 MG PO TABS: 1.0000 | ORAL_TABLET | Freq: Three times a day (TID) | ORAL | 0 refills | Status: DC | PRN

## 2017-12-08 NOTE — ED Notes (Signed)
Fentanyl order DC'd because PT would like to be able to drive home. EDP aware. PT requests pain med that won't make him drowsy, EDP aware

## 2017-12-08 NOTE — ED Notes (Addendum)
Per [place], PT is able to drive after 30 minutes after 25 mcg fentanyl

## 2017-12-08 NOTE — ED Notes (Signed)
Temporary splint aplied by PA, do not place metal splint until xray has been repeated

## 2017-12-08 NOTE — ED Notes (Signed)
Anderson Malta with ortho made aware

## 2017-12-08 NOTE — ED Notes (Signed)
PT now deciding whether or not he will accept fentanyl. PT has not called his wife to enquire about a ride home.

## 2017-12-08 NOTE — ED Notes (Signed)
ED PA at bedside

## 2017-12-08 NOTE — ED Triage Notes (Signed)
Pt reports falling down the stairs this morning. Pt denies loosing consciousness. Pt is alert and oriented in triage. Pt reports pain to left wrist, rt middle finger, and noted to have abrasions and swelling above the left eye. Pt denies any blood thinners.

## 2017-12-08 NOTE — ED Provider Notes (Signed)
[facility] EMERGENCY DEPARTMENT Provider Note   CSN: [id number] Arrival date & time: 12/08/17  9562     History   Chief Complaint Chief Complaint  Patient presents with  . Fall    HPI Jose [name] is a 77 y.o. male who presents for evaluation of mechanical fall that occurred this morning.  Patient reports that he fell down approximately 12 stairs.  Patient reports that he was walking and states that he has difficulty with his left lower knee.  Patient reports that his knee gave out on him, causing him to fall.  He denies any preceding chest pain or dizziness prior to fall.  Patient reports that he landed face first.  Patient states he did not have any LOC and was able to recall the entire event.  Patient reports that since then, he has had pain to the left wrist and right middle finger.  Patient also notes that he started having some swelling around the left eye.  Patient states he is on aspirin but no other blood thinners.  Patient was able to ambulate immediately after the incident. Patient reports that since being in the ED he has started coughing up some blood. No vomiting.  Patient denies any vision changes, chest pain, abdominal pain, back pain, neck pain, numbness/weakness of his extremities.  The history is provided by the patient.    Past Medical History:  Diagnosis Date  . Acute lower GI bleeding 01/25/2013; 09/2014   diverticular bleed  . ANXIETY 08/22/2008  . Arthritis    "a little bit" (04/14/2015)  . Barrett's esophagus 2003   hematochezia as a result since 2003  . Basal cell cancer    removed from right forearm.   . Bradycardia 04/14/2015  . CORONARY ARTERY DISEASE    2 stents  . DIVERTICULOSIS, COLON 2003  . ERECTILE DYSFUNCTION 08/22/2008  . GERD (gastroesophageal reflux disease)   . Heart murmur 04/14/2015   "they think I did; that's why I'm here" (04/14/2015)  . History of kidney stones   . HYPERLIPIDEMIA 02/09/2007  . Kidney stones    "blasted or passed" (04/14/2015)  . MI (myocardial infarction) ([place]) 2000  . Numbness    right side - history of   . OBESITY 03/20/2010  . Squamous cell cancer of skin of earlobe    removed from area behind right earlobe    Patient Active Problem List   Diagnosis Date Noted  . Elevated PSA 08/03/2016  . Benign prostatic hypertrophy (BPH) with nocturia 05/27/2016  . Lumbosacral radiculopathy 06/17/2015  . HLD (hyperlipidemia) 06/17/2015  . Numbness 06/17/2015  . Right leg numbness   . Bradycardia 04/14/2015  . Sinus bradycardia 03/19/2015  . Diverticulosis of large intestine with hemorrhage 03/19/2015  . Acute posthemorrhagic anemia 10/17/2014  . Diverticulosis of colon with hemorrhage   . Gastrointestinal hemorrhage 10/15/2014  . Actinic keratosis 08/07/2014  . Elevated blood pressure 03/29/2014  . Hypertension 03/29/2014  . Hyperglycemia 02/26/2013  . OBESITY 03/20/2010  . Generalized anxiety disorder 08/22/2008  . ERECTILE DYSFUNCTION 08/22/2008  . Hyperlipidemia 02/09/2007  . Atherosclerosis of coronary artery 02/09/2007  . Barrett's esophagus 02/09/2007  . Diverticulosis of large intestine 02/09/2007    Past Surgical History:  Procedure Laterality Date  . BASAL CELL CARCINOMA EXCISION     posterior right ear.   Marland Kitchen CATARACT EXTRACTION, BILATERAL    . COLONOSCOPY N/A 01/26/2013   Procedure: COLONOSCOPY;  Surgeon: Milus Banister, MD;  Location: [facility];  Service: Endoscopy;  Laterality: N/A;  . CORONARY ANGIOPLASTY WITH STENT PLACEMENT  09/06/1998   LAD stent per Dr Elonda Husky in [place]. 2 stents.   . CYSTOSCOPY W/ URETERAL STENT PLACEMENT Left 12/21/2016   Procedure: CYSTOSCOPY WITH RETROGRADE PYELOGRAM/URETERAL STENT PLACEMENT;  Surgeon: Alexis Frock, MD;  Location: [facility];  Service: Urology;  Laterality: Left;  . ESOPHAGOGASTRODUODENOSCOPY (EGD) WITH ESOPHAGEAL DILATION     "q time I have an EGD; usually q 3-4 years" (04/14/2015)  . EXTRACORPOREAL SHOCK WAVE  LITHOTRIPSY Left 12/30/2016   Procedure: LEFT EXTRACORPOREAL SHOCK WAVE LITHOTRIPSY (ESWL);  Surgeon: Carolan Clines, MD;  Location: [facility];  Service: Urology;  Laterality: Left;  . LITHOTRIPSY  X 2  . SQUAMOUS CELL CARCINOMA EXCISION     right forearm  . TONSILLECTOMY  1971  . UPPER GASTROINTESTINAL ENDOSCOPY          Home Medications    Prior to Admission medications   Medication Sig Start Date End Date Taking? Authorizing Provider  acetaminophen (TYLENOL) 500 MG tablet Take 1,000 mg by mouth every 6 (six) hours as needed (pain).    [provider]  ALPRAZolam Duanne Moron) 0.25 MG tablet TAKE 1 TABLET BY MOUTH EVERY DAY (MAX 3 DOSES PER WEEK) Patient not taking: Reported on 08/17/2017 05/27/16   Marin Olp, MD  aspirin 81 MG EC tablet Take 1 tablet (81 mg total) by mouth daily. Swallow whole. 10/18/14   Hosie Poisson, MD  diphenhydramine-acetaminophen (TYLENOL PM) 25-500 MG TABS tablet Take 1 tablet by mouth at bedtime as needed (sleep, pain).    [provider]  omeprazole (PRILOSEC) 20 MG capsule TAKE 1 CAPSULE DAILY (NEED OFFICE VISIT) 03/07/17   Marin Olp, MD  oxyCODONE-acetaminophen (PERCOCET/ROXICET) 5-325 MG tablet Take 1-2 tablets by mouth every 8 (eight) hours as needed for severe pain. 12/08/17   Volanda Napoleon, PA-C  rosuvastatin (CRESTOR) 10 MG tablet Take 1 tablet (10 mg total) by mouth daily at 6 PM. 08/03/16   Marin Olp, MD  senna-docusate (SENOKOT-S) 8.6-50 MG tablet Take 1 tablet by mouth 2 (two) times daily. While taking strongest pain meds to prevent constipation. Patient not taking: Reported on 08/17/2017 12/21/16   Alexis Frock, MD  tadalafil (CIALIS) 20 MG tablet TAKE ONE-HALF (1/2) TABLET DAILY AS NEEDED FOR ERECTILE DYSFUNCTION Patient not taking: Reported on 08/17/2017 06/08/17   Marin Olp, MD  traMADol (ULTRAM) 50 MG tablet Take 1-2 tablets (50-100 mg total) by mouth every 6 (six) hours as needed for severe pain.  Post-operatively 12/21/16   Alexis Frock, MD    Family History Family History  Problem Relation Age of Onset  . Alcohol abuse Father   . Liver disease Father   . Heart disease Brother        CABG  . Colon cancer Neg Hx     Social History Social History   Tobacco Use  . Smoking status: Former Smoker    Packs/day: 1.00    Years: 30.00    Pack years: 30.00    Types: Cigarettes  . Smokeless tobacco: Never Used  . Tobacco comment: "quit smoking cigarettes in ~ 1990"  Substance Use Topics  . Alcohol use: Yes    Comment: 04/14/2015 "might drink a couple beers/month"  . Drug use: No     Allergies   Atorvastatin   Review of Systems Review of Systems  Constitutional: Negative for chills and fever.  HENT: Negative for congestion.   Respiratory: Positive for cough. Negative for shortness of  breath.   Cardiovascular: Negative for chest pain.  Gastrointestinal: Negative for abdominal pain, diarrhea, nausea and vomiting.  Genitourinary: Negative for dysuria and hematuria.  Musculoskeletal: Negative for back pain and neck pain.       Left wrist Right 3rd digit pain  Skin: Positive for wound. Negative for rash.  Neurological: Negative for dizziness, weakness, numbness and headaches.  Psychiatric/Behavioral: Negative for confusion.  All other systems reviewed and are negative.    Physical Exam Updated Vital Signs BP 140/78   Pulse (!) 51   Temp 98.1 F (36.7 C) (Oral)   Resp 16   SpO2 94%   Physical Exam  Constitutional: He is oriented to person, place, and time. He appears well-developed and well-nourished.  HENT:  Head: Normocephalic and atraumatic.    Nose: Nose normal. No nasal septal hematoma. No epistaxis.  Mouth/Throat: Oropharynx is clear and moist and mucous membranes are normal.  Eyes: Pupils are equal, round, and reactive to light. Conjunctivae, EOM and lids are normal.  EOMs intact without any pain.  No warmth, erythema, deformity or crepitus  surrounding the periorbital regions bilaterally.  Left periorbital region with some swelling.  Neck: Full passive range of motion without pain.  Full flexion/extension and lateral movement of neck fully intact. No bony midline tenderness. No deformities or crepitus.   Cardiovascular: Normal rate, regular rhythm, normal heart sounds and normal pulses. Exam reveals no gallop and no friction rub.  No murmur heard. Pulses:      Radial pulses are 2+ on the right side, and 2+ on the left side.  Pulmonary/Chest: Effort normal and breath sounds normal.  No evidence of respiratory distress. Able to speak in full sentences without difficulty.  No tenderness to palpation noted to the anterior chest wall.  No deformity or crepitus noted.  Abdominal: Soft. Normal appearance. There is no tenderness. There is no rigidity and no guarding.  Abdomen is soft, non-distended, non-tender.   Musculoskeletal: Normal range of motion.       Thoracic back: He exhibits no tenderness.       Lumbar back: He exhibits no tenderness.  Tenderness palpation noted to the right third and fourth digit with overlying ecchymosis.  Deformity noted at the right third digit at the PIP joint.  Limited range of motion of left third finger secondary to deformity.  No tenderness palpation right elbow, right shoulder.  No tenderness palpation left shoulder, left elbow.  Tenderness palpation to the left wrist.  Some soft tissue swelling surrounding the left wrist.  No tenderness palpation to the hand, digits of left hand.  Patient able to move all 5 digits of left hand without any difficulty. No tenderness to palpation to bilateral knees and ankles. No deformities or crepitus noted. FROM of BLE without any difficulty.   Neurological: He is alert and oriented to person, place, and time.  Follows commands, Moves all extremities  5/5 strength to  BLE, limited strength of bilateral upper extremities secondary to pain. Sensation intact throughout all  major nerve distributions (including BLE)  Skin: Skin is warm and dry. Capillary refill takes less than 2 seconds.  Good distal cap refill.  BUE is not dusky in appearance or cool to touch.  Psychiatric: He has a normal mood and affect. His speech is normal.  Nursing note and vitals reviewed.    ED Treatments / Results  Labs (all labs ordered are listed, but only abnormal results are displayed) Labs Reviewed  BASIC METABOLIC PANEL - Abnormal; Notable  for the following components:      Result Value   Glucose, Bld 103 (*)    All other components within normal limits  CBC WITH DIFFERENTIAL/PLATELET    EKG None  Radiology Dg Wrist Complete Left  Result Date: 12/08/2017 CLINICAL DATA:  Pt reports falling down the stairs this morning. Pt denies loosing consciousness. Pt is alert and oriented in triage. Pt reports pain to left wrist, rt middle finger, and noted to have abrasions and swelling above the left eye. EXAM: LEFT WRIST - COMPLETE 3+ VIEW COMPARISON:  None. FINDINGS: There is a comminuted fracture of the distal radius. Is a transverse fracture component across the metaphysis with separate fracture components intersecting the articular surface at the lunate facet. Fracture is mildly impacted leading to 10 degrees of dorsal angulation of the articular surface. There is a small associated ulnar styloid fracture. No other fractures.  No dislocation. There is surrounding soft tissue swelling. Bones are demineralized. IMPRESSION: 1. Comminuted fracture of the distal radius, mildly impacted with 10 degrees of dorsal angulation of the articular surface. 2. Small associated ulnar styloid fracture. 3. No dislocation. Electronically Signed   By: Lajean Manes M.D.   On: 12/08/2017 09:15   Ct Head Wo Contrast  Result Date: 12/08/2017 CLINICAL DATA:  Head trauma with headache.  Initial encounter. EXAM: CT HEAD WITHOUT CONTRAST CT MAXILLOFACIAL WITHOUT CONTRAST CT CERVICAL SPINE WITHOUT CONTRAST  TECHNIQUE: Multidetector CT imaging of the head, cervical spine, and maxillofacial structures were performed using the standard protocol without intravenous contrast. Multiplanar CT image reconstructions of the cervical spine and maxillofacial structures were also generated. COMPARISON:  Brain MRI 04/14/2015 FINDINGS: CT HEAD FINDINGS Brain: No evidence of acute infarction, hemorrhage, hydrocephalus, extra-axial collection or mass lesion/mass effect. No visible inferior frontal contusion. No pneumocephalus. Vascular: Atherosclerotic calcification.  No hyperdense vessel. Skull: Negative for calvarial fracture. CT MAXILLOFACIAL FINDINGS Osseous: Nondisplaced fracture through the roof of the left orbit entering the frontal sinus and along the floor of the anterior cranial fossa. No ethmoid continuation. No continuation to the orbital apex. Orbits: Soft tissue emphysema in the upper postseptal and preseptal spaces. Bilateral cataract resection. No hematoma or proptosis. Sinuses: Mild mucosal thickening at the left frontal ethmoidal recess. Soft tissues: Left forehead contusion. CT CERVICAL SPINE FINDINGS Alignment: No traumatic malalignment Skull base and vertebrae: Mild concavity and irregularity of the T1, T2, T3 superior endplates, new at T1 and T2 which were visualized on the 2016 cervical MRI. Soft tissues and spinal canal: No prevertebral fluid or swelling. No visible canal hematoma. Disc levels: Degenerative facet spurring greatest at C7-T1. Degenerative disc narrowing and ridging greatest at C4-5 to C6-7. Upper chest: Negative IMPRESSION: 1. Left orbital roof fracture involving the frontal sinus with extraconal and preseptal soft tissue emphysema. No proptosis. 2. No visible intracranial injury. 3. T1, T2, and T3 superior endplate fractures that are age-indeterminate. Depression is minimal. Electronically Signed   By: Monte Fantasia M.D.   On: 12/08/2017 13:58   Ct Angio Chest Pe W And/or Wo  Contrast  Result Date: 12/08/2017 CLINICAL DATA:  77 year old male status post fall down stairs head 1st. Hemoptysis. EXAM: CT ANGIOGRAPHY CHEST WITH CONTRAST TECHNIQUE: Multidetector CT imaging of the chest was performed using the standard protocol during bolus administration of intravenous contrast. Multiplanar CT image reconstructions and MIPs were obtained to evaluate the vascular anatomy. CONTRAST:  173mL ISOVUE-370 IOPAMIDOL (ISOVUE-370) INJECTION 76% COMPARISON:  Cervical spine CT today reported separately. CT Abdomen and Pelvis 12/21/2016. Thoracic spine  MRI 04/15/2015. FINDINGS: Cardiovascular: Good contrast bolus timing in the pulmonary arterial tree. Mild lower lobe respiratory motion. No focal filling defect identified in the pulmonary arteries to suggest acute pulmonary embolism. Extensive calcified coronary artery atherosclerosis and/or stents. Calcified aortic atherosclerosis. No contrast in the aorta on these images. Cardiomegaly. No pericardial effusion. Mediastinum/Nodes: Negative.  No lymphadenopathy. Lungs/Pleura: Low lung volumes with bilateral atelectasis. Major airways are patent aside from atelectatic changes at the carina. No pneumothorax.  No pleural effusion. Bilateral dependent pulmonary ground-glass opacity most resembles atelectasis. No consolidation. No pulmonary contusion suspected. Upper Abdomen: Negative visible liver, gallbladder, spleen, pancreas, adrenal glands, and bowel in the upper abdomen. No upper abdominal free fluid. Musculoskeletal: Osteopenia. Mild superior endplate compression in the upper thoracic spine, most notable at T3 but probably also affecting T1 and T2 (sagittal images 71-73) is new since the 2016 MRI. The posterior elements appear intact. The other visible thoracic levels appear stable and intact. No rib fracture identified. Review of the MIP images confirms the above findings. IMPRESSION: 1.  Negative for acute pulmonary embolus. 2. Suspected mild upper  thoracic compression fractures at T1 through T3 are new since 2016 and may be acute in this clinical setting. No complicating features. Noncontrast Thoracic MRI would confirm. 3. No other acute traumatic injury identified in the chest. 4. Low lung volumes with pulmonary atelectasis. 5. Cardiomegaly with calcified coronary artery and Aortic Atherosclerosis (ICD10-I70.0). Electronically Signed   By: Genevie Ann M.D.   On: 12/08/2017 13:43   Ct Cervical Spine Wo Contrast  Result Date: 12/08/2017 CLINICAL DATA:  Head trauma with headache.  Initial encounter. EXAM: CT HEAD WITHOUT CONTRAST CT MAXILLOFACIAL WITHOUT CONTRAST CT CERVICAL SPINE WITHOUT CONTRAST TECHNIQUE: Multidetector CT imaging of the head, cervical spine, and maxillofacial structures were performed using the standard protocol without intravenous contrast. Multiplanar CT image reconstructions of the cervical spine and maxillofacial structures were also generated. COMPARISON:  Brain MRI 04/14/2015 FINDINGS: CT HEAD FINDINGS Brain: No evidence of acute infarction, hemorrhage, hydrocephalus, extra-axial collection or mass lesion/mass effect. No visible inferior frontal contusion. No pneumocephalus. Vascular: Atherosclerotic calcification.  No hyperdense vessel. Skull: Negative for calvarial fracture. CT MAXILLOFACIAL FINDINGS Osseous: Nondisplaced fracture through the roof of the left orbit entering the frontal sinus and along the floor of the anterior cranial fossa. No ethmoid continuation. No continuation to the orbital apex. Orbits: Soft tissue emphysema in the upper postseptal and preseptal spaces. Bilateral cataract resection. No hematoma or proptosis. Sinuses: Mild mucosal thickening at the left frontal ethmoidal recess. Soft tissues: Left forehead contusion. CT CERVICAL SPINE FINDINGS Alignment: No traumatic malalignment Skull base and vertebrae: Mild concavity and irregularity of the T1, T2, T3 superior endplates, new at T1 and T2 which were  visualized on the 2016 cervical MRI. Soft tissues and spinal canal: No prevertebral fluid or swelling. No visible canal hematoma. Disc levels: Degenerative facet spurring greatest at C7-T1. Degenerative disc narrowing and ridging greatest at C4-5 to C6-7. Upper chest: Negative IMPRESSION: 1. Left orbital roof fracture involving the frontal sinus with extraconal and preseptal soft tissue emphysema. No proptosis. 2. No visible intracranial injury. 3. T1, T2, and T3 superior endplate fractures that are age-indeterminate. Depression is minimal. Electronically Signed   By: Monte Fantasia M.D.   On: 12/08/2017 13:58   Dg Hand Complete Right  Result Date: 12/08/2017 CLINICAL DATA:  Trait EXAM: RIGHT HAND - COMPLETE 3+ VIEW COMPARISON:  Pre reduction images obtained today at 9 a.m. FINDINGS: Dislocated PIP joint of the third finger  has been successfully reduced. There are small bony fragments along the radial margin of the head the middle finger proximal phalanx consistent with small avulsion fractures the medial collateral ligament. No other fractures. There is joint space narrowing of the second and third metacarpophalangeal joints. Pattern suggests rheumatoid arthritis. Soft tissue swelling is noted most prominently along the middle finger. IMPRESSION: Successful reduction of the third finger PIP joint dislocation. Electronically Signed   By: Lajean Manes M.D.   On: 12/08/2017 12:45   Dg Finger Middle Right  Result Date: 12/08/2017 CLINICAL DATA:  Recent fall with third digit deformity EXAM: RIGHT MIDDLE FINGER 2+V COMPARISON:  None. FINDINGS: There is posterior dislocation of the middle phalanx with respect to the proximal phalanx at the PIP joint. Mild degenerative changes are noted in the DIP joint. There is some regularity along the palm are aspect of the proximal middle phalanx which may represent a small avulsion. Re-evaluation following reduction is recommended. There is also a mildly displaced fracture  at the base of the fourth middle phalanx. No other focal abnormality is seen. IMPRESSION: Dislocation in the PIP joint of the third digit as described. Some suggestion of avulsion at the base of the middle phalanx is noted. Fracture at the base of the fourth middle phalanx is noted as well. No dislocation is seen. Electronically Signed   By: Inez Catalina M.D.   On: 12/08/2017 09:14   Ct Maxillofacial Wo Contrast  Result Date: 12/08/2017 CLINICAL DATA:  Head trauma with headache.  Initial encounter. EXAM: CT HEAD WITHOUT CONTRAST CT MAXILLOFACIAL WITHOUT CONTRAST CT CERVICAL SPINE WITHOUT CONTRAST TECHNIQUE: Multidetector CT imaging of the head, cervical spine, and maxillofacial structures were performed using the standard protocol without intravenous contrast. Multiplanar CT image reconstructions of the cervical spine and maxillofacial structures were also generated. COMPARISON:  Brain MRI 04/14/2015 FINDINGS: CT HEAD FINDINGS Brain: No evidence of acute infarction, hemorrhage, hydrocephalus, extra-axial collection or mass lesion/mass effect. No visible inferior frontal contusion. No pneumocephalus. Vascular: Atherosclerotic calcification.  No hyperdense vessel. Skull: Negative for calvarial fracture. CT MAXILLOFACIAL FINDINGS Osseous: Nondisplaced fracture through the roof of the left orbit entering the frontal sinus and along the floor of the anterior cranial fossa. No ethmoid continuation. No continuation to the orbital apex. Orbits: Soft tissue emphysema in the upper postseptal and preseptal spaces. Bilateral cataract resection. No hematoma or proptosis. Sinuses: Mild mucosal thickening at the left frontal ethmoidal recess. Soft tissues: Left forehead contusion. CT CERVICAL SPINE FINDINGS Alignment: No traumatic malalignment Skull base and vertebrae: Mild concavity and irregularity of the T1, T2, T3 superior endplates, new at T1 and T2 which were visualized on the 2016 cervical MRI. Soft tissues and spinal  canal: No prevertebral fluid or swelling. No visible canal hematoma. Disc levels: Degenerative facet spurring greatest at C7-T1. Degenerative disc narrowing and ridging greatest at C4-5 to C6-7. Upper chest: Negative IMPRESSION: 1. Left orbital roof fracture involving the frontal sinus with extraconal and preseptal soft tissue emphysema. No proptosis. 2. No visible intracranial injury. 3. T1, T2, and T3 superior endplate fractures that are age-indeterminate. Depression is minimal. Electronically Signed   By: Monte Fantasia M.D.   On: 12/08/2017 13:58    Procedures Reduction of dislocation Date/Time: 12/08/2017 12:35 PM Performed by: Volanda Napoleon, PA-C Authorized by: Volanda Napoleon, PA-C  Consent: Verbal consent obtained. Consent given by: patient Patient understanding: patient states understanding of the procedure being performed Patient consent: the patient's understanding of the procedure matches consent given Procedure consent:  procedure consent matches procedure scheduled Relevant documents: relevant documents present and verified Test results: test results available and properly labeled Site marked: the operative site was marked Imaging studies: imaging studies available Patient identity confirmed: verbally with patient Local anesthesia used: yes Anesthesia: nerve block  Anesthesia: Local anesthesia used: yes Local Anesthetic: lidocaine 1% without epinephrine Patient tolerance: Patient tolerated the procedure well with no immediate complications    (including critical care time)  Medications Ordered in ED Medications  iopamidol (ISOVUE-370) 76 % injection (has no administration in time range)  bacitracin ointment (has no administration in time range)  lidocaine (PF) (XYLOCAINE) 1 % injection 10 mL (10 mLs Intradermal Given by Other 12/08/17 1055)  iopamidol (ISOVUE-370) 76 % injection 100 mL (100 mLs Intravenous Contrast Given 12/08/17 1259)  fentaNYL (SUBLIMAZE)  injection 25 mcg (25 mcg Intravenous Given 12/08/17 1501)     Initial Impression / Assessment and Plan / ED Course  I have reviewed the triage vital signs and the nursing notes.  Pertinent labs & imaging results that were available during my care of the patient were reviewed by me and considered in my medical decision making (see chart for details).     77 year old male who presents for evaluation after mechanical fall that occurred this morning.  Patient reports that he was walking down some steps and states that his knee gave out, causing him to fall down approximately 12 steps.  Patient reports that he landed face first.  He states he did not lose consciousness and was able to recall the entire event.  He has been able to ambulate since then.  Patient is afebrile, non-toxic appearing, sitting comfortably on examination table. Vital signs reviewed and stable.  Patient is neurovascularly intact. On ED arrival, he is complaining of some left wrist pain, right hand pain.  Patient with hematoma left frontal scalp.  He has abrasions noted to left shoulder.  Tenderness palpation to left wrist, right hand.  No C-spine tenderness.  Initial hand x-rays ordered at triage.  We will plan to add CT head, neck, maxillofacial.  During the ED course, patient states he started having some hemoptysis.  No evidence of epistaxis that would that would explain hemoptysis.  We will plan to add CTA of chest. Offered patient analgesics. Patient declined.   Left wrist x-ray reviewed.  There is a comminuted fracture of the distal radius that is mildly impacted with 10 degrees of dorsal angulation of the articular surface.  Associated with a small ulnar styloid fracture.  X-ray of right middle finger shows dislocation of the PIP joint of the third digit with some suggestion of an avulsion fracture at the base of the middle phalanx.  Fracture at the base of the fourth middle phalanx is also noted.  Patient requesting analgesics  now. Informed patient that he would have to give IM or IV meds   11:53 AM: Discussed with Dr. Levell July RN. Will have Kuzma call back once he is done in the OR.   12:35 PM: Discussed patient with Dr. Leanora Cover (hand).  Recommends placing patient in a sugar tong splint.  He will plan to see the patient outpatient in his office tomorrow morning.  Reduction of the PIP as documented above.  Postreduction films show successful reduction.  We will plan to splint the finger. Will also splint 4th finger as there is a fracture at the base.   CBC without any significant leukocytosis or anemia.  BMP shows no abnormalities.  CT head there  is no evidence of skull fracture, hemorrhage.  CT maxillofacial shows a nondisplaced fracture through the roof of the left orbit entering the frontal sinus.  CT C-spine negative for any acute fracture dislocation.  CT angios chest shows no rib fracture, PE, pneumothorax.  There is mention of mild superior endplate compression in the upper thoracic spine most notable at T3 but also new and T1 and T2.  Patient has no midline T or L-spine tenderness.  Discussed patient with Dr. Redmond Baseman (ENT on call for facial trauma). He reviewed patient's CT scan.  No indication that this orbital fracture needs surgical repair, antibiotics.  He recommends outpatient follow-up with ENT and approxi-1 week.  No recommendations for antibiotics at this time.  Discussed results with patient.  Patient is tolerating p.o. in the department any difficulty.  I discussed with case management.  Will plan for at home face-to-face PT, OT, RN.  Instructed patient to follow-up with hand doctor tomorrow morning.  Instructed to follow-up with Dr. Redmond Baseman with ENT in approximately 1 week for evaluation of orbital fracture.  Reevaluation after splint placement.  Patient has good distal cap refill.  Good sensation distally.  Encouraged RICE supportive therapy at home.   Final Clinical Impressions(s) / ED Diagnoses    Final diagnoses:  Contusion of face, initial encounter  Closed fracture of orbit, initial encounter [facility]  Other closed extra-articular fracture of distal end of left radius, initial encounter  Dislocation of proximal interphalangeal joint of finger, initial encounter  Closed nondisplaced fracture of distal phalanx of right ring finger, initial encounter    ED Discharge Orders        [place]     12/08/17 1527    Face-to-face encounter (required for Medicare/Medicaid patients)    Comments:  I Volanda Napoleon certify that this patient is under my care and that I, or a nurse practitioner or physician's assistant working with me, had a face-to-face encounter that meets the physician face-to-face encounter requirements with this patient on 12/08/2017. The encounter with the patient was in whole, or in part for the following medical condition(s) which is the primary reason for home health care (List medical condition):   12/08/17 1527    oxyCODONE-acetaminophen (PERCOCET/ROXICET) 5-325 MG tablet  Every 8 hours PRN     12/08/17 1538       Volanda Napoleon, PA-C 12/08/17 1550    Valarie Merino, MD 12/08/17 1609

## 2017-12-08 NOTE — Discharge Instructions (Signed)
Take Tylenol 1000 mg 3 times a day for pain.  He can take the pain medication for severe breakthrough pain.  Do not exceed 4000 mg of Tylenol a day.  As we discussed, you need to elevate the left arm to help with swelling.  You will follow-up with Dr. Fredna Dow (the hand doctor) tomorrow morning.  Call their office tomorrow morning to arrange for an appointment.  He will follow-up with Dr. Redmond Baseman (the ear nose and throat doctor) in approximately 1 week for evaluation of your orbital fracture.  Return to the emergency department for any worsening pain of your hand, discoloration of her fingers, numbness or weakness, vomiting, vision changes, chest pain, difficulty breathing or any other worsening or concerning symptoms.

## 2017-12-08 NOTE — Progress Notes (Signed)
Orthopedic Tech Progress Note Patient Details:  Jose [name] [birth year] [id number]  Ortho Devices Type of Ortho Device: Ace wrap, Finger splint, Short arm splint, Arm sling Ortho Device/Splint Interventions: Application   Post Interventions Patient Tolerated: Well Instructions Provided: Care of device   [name] 12/08/2017, 3:57 PM

## 2017-12-08 NOTE — ED Notes (Signed)
Pt adamant that he must go to his car to get his phone. Pt states that he walked in and he can walk to his car. States that he has an important call that he cannot miss. Pt continues to insist that he must go to his car.

## 2017-12-08 NOTE — ED Notes (Signed)
Patient transported to CT 

## 2017-12-12 DIAGNOSIS — S62654A Nondisplaced fracture of medial phalanx of right ring finger, initial encounter for closed fracture: Secondary | ICD-10-CM | POA: Diagnosis not present

## 2017-12-12 DIAGNOSIS — S52572A Other intraarticular fracture of lower end of left radius, initial encounter for closed fracture: Secondary | ICD-10-CM | POA: Diagnosis not present

## 2017-12-12 DIAGNOSIS — S62656A Nondisplaced fracture of medial phalanx of right little finger, initial encounter for closed fracture: Secondary | ICD-10-CM | POA: Diagnosis not present

## 2017-12-12 DIAGNOSIS — S63282A Dislocation of proximal interphalangeal joint of right middle finger, initial encounter: Secondary | ICD-10-CM | POA: Diagnosis not present

## 2017-12-12 DIAGNOSIS — S62652A Nondisplaced fracture of medial phalanx of right middle finger, initial encounter for closed fracture: Secondary | ICD-10-CM | POA: Diagnosis not present

## 2017-12-13 DIAGNOSIS — M25432 Effusion, left wrist: Secondary | ICD-10-CM | POA: Insufficient documentation

## 2017-12-13 DIAGNOSIS — M25632 Stiffness of left wrist, not elsewhere classified: Secondary | ICD-10-CM | POA: Insufficient documentation

## 2017-12-13 DIAGNOSIS — M79644 Pain in right finger(s): Secondary | ICD-10-CM | POA: Insufficient documentation

## 2017-12-13 DIAGNOSIS — M25641 Stiffness of right hand, not elsewhere classified: Secondary | ICD-10-CM | POA: Insufficient documentation

## 2017-12-15 DIAGNOSIS — S0285XA Fracture of orbit, unspecified, initial encounter for closed fracture: Secondary | ICD-10-CM | POA: Insufficient documentation

## 2017-12-15 DIAGNOSIS — S0280XA Fracture of other specified skull and facial bones, unspecified side, initial encounter for closed fracture: Secondary | ICD-10-CM | POA: Diagnosis not present

## 2017-12-19 ENCOUNTER — Encounter: Payer: Self-pay | Admitting: Family Medicine

## 2017-12-19 ENCOUNTER — Ambulatory Visit (INDEPENDENT_AMBULATORY_CARE_PROVIDER_SITE_OTHER): Payer: Medicare Other | Admitting: Family Medicine

## 2017-12-19 VITALS — BP 126/80 | HR 68 | Temp 97.8°F | Ht 70.0 in | Wt 203.8 lb

## 2017-12-19 DIAGNOSIS — I2510 Atherosclerotic heart disease of native coronary artery without angina pectoris: Secondary | ICD-10-CM | POA: Diagnosis not present

## 2017-12-19 DIAGNOSIS — R739 Hyperglycemia, unspecified: Secondary | ICD-10-CM

## 2017-12-19 DIAGNOSIS — R972 Elevated prostate specific antigen [PSA]: Secondary | ICD-10-CM | POA: Diagnosis not present

## 2017-12-19 DIAGNOSIS — E785 Hyperlipidemia, unspecified: Secondary | ICD-10-CM

## 2017-12-19 MED ORDER — ROSUVASTATIN CALCIUM 10 MG PO TABS: 10.0000 mg | ORAL_TABLET | Freq: Every day | ORAL | 3 refills | Status: DC

## 2017-12-19 MED ORDER — OMEPRAZOLE 20 MG PO CPDR: DELAYED_RELEASE_CAPSULE | ORAL | 3 refills | Status: DC

## 2017-12-19 MED ORDER — TADALAFIL 20 MG PO TABS: ORAL_TABLET | ORAL | 3 refills | Status: DC

## 2017-12-19 NOTE — Assessment & Plan Note (Signed)
S: history 2 stents. On aspirin and statin (but was out of statin 1 week). Still with [place] [facility] for now A/P: continue current rx

## 2017-12-19 NOTE — Assessment & Plan Note (Signed)
S: down 10 lbs from last visit with me 2017. He has cut down on sweets. 2 fried eggs, toast, glass of OJ for breakfast- he thinks this has helped him. Cutting down on soft drink- doing more water Lab Results  Component Value Date   HGBA1C 5.8 07/28/2016  A/P: will update a1c, discussed stopping OJ could help as well.

## 2017-12-19 NOTE — Assessment & Plan Note (Signed)
S: advised 6 month repeat PSA in 2017 Lab Results  Component Value Date   PSA 3.07 07/28/2016   PSA 1.72 10/16/2010   PSA 1.38 04/18/2009  A/P: up date psa- he is already plugged in with urology

## 2017-12-19 NOTE — Progress Notes (Signed)
Subjective:  Jose [name] is a 77 y.o. year old very pleasant male patient who presents for/with See problem oriented charting ROS- No chest pain or shortness of breath. No headache or blurry vision.    Past Medical History-  Patient Active Problem List   Diagnosis Date Noted  . Atherosclerosis of coronary artery 02/09/2007    Priority: High  . Barrett's esophagus 02/09/2007    Priority: High  . Elevated PSA 08/03/2016    Priority: Medium  . Generalized anxiety disorder 08/22/2008    Priority: Medium  . Hyperlipidemia 02/09/2007    Priority: Medium  . Actinic keratosis 08/07/2014    Priority: Low  . Elevated blood pressure 03/29/2014    Priority: Low  . Hyperglycemia 02/26/2013    Priority: Low  . OBESITY 03/20/2010    Priority: Low  . ERECTILE DYSFUNCTION 08/22/2008    Priority: Low  . Diverticulosis of large intestine 02/09/2007    Priority: Low  . Benign prostatic hypertrophy (BPH) with nocturia 05/27/2016  . Lumbosacral radiculopathy 06/17/2015  . HLD (hyperlipidemia) 06/17/2015  . Numbness 06/17/2015  . Right leg numbness   . Bradycardia 04/14/2015  . Sinus bradycardia 03/19/2015  . Diverticulosis of large intestine with hemorrhage 03/19/2015  . Acute posthemorrhagic anemia 10/17/2014  . Diverticulosis of colon with hemorrhage   . Gastrointestinal hemorrhage 10/15/2014  . Hypertension 03/29/2014    Medications- reviewed and updated Current Outpatient Medications  Medication Sig Dispense Refill  . acetaminophen (TYLENOL) 500 MG tablet Take 1,000 mg by mouth every 6 (six) hours as needed (pain).    Marland Kitchen ALPRAZolam (XANAX) 0.25 MG tablet TAKE 1 TABLET BY MOUTH EVERY DAY (MAX 3 DOSES PER WEEK) 30 tablet 1  . aspirin 81 MG EC tablet Take 1 tablet (81 mg total) by mouth daily. Swallow whole. 30 tablet 12  . diphenhydramine-acetaminophen (TYLENOL PM) 25-500 MG TABS tablet Take 1 tablet by mouth at bedtime as needed (sleep, pain).    Marland Kitchen omeprazole (PRILOSEC) 20 MG  capsule TAKE 1 CAPSULE DAILY 90 capsule 3  . oxyCODONE-acetaminophen (PERCOCET/ROXICET) 5-325 MG tablet Take 1-2 tablets by mouth every 8 (eight) hours as needed for severe pain. 10 tablet 0  . rosuvastatin (CRESTOR) 10 MG tablet Take 1 tablet (10 mg total) by mouth daily at 6 PM. 90 tablet 3  . senna-docusate (SENOKOT-S) 8.6-50 MG tablet Take 1 tablet by mouth 2 (two) times daily. While taking strongest pain meds to prevent constipation. 30 tablet 0  . tadalafil (CIALIS) 20 MG tablet TAKE ONE-HALF (1/2) TABLET DAILY AS NEEDED FOR ERECTILE DYSFUNCTION 8 tablet 3  . traMADol (ULTRAM) 50 MG tablet Take 1-2 tablets (50-100 mg total) by mouth every 6 (six) hours as needed for severe pain. Post-operatively 20 tablet 0   No current facility-administered medications for this visit.     Objective: BP 126/80 (BP Location: Left Arm, Patient Position: Sitting, Cuff Size: Large)   Pulse 68   Temp 97.8 F (36.6 C) (Oral)   Ht 5\' 10"  (1.778 m)   Wt 203 lb 12.8 oz (92.4 kg)   SpO2 95%   BMI 29.24 kg/m  Gen: NAD, resting comfortably CV: RRR no murmurs rubs or gallops Lungs: CTAB no crackles, wheeze, rhonchi Abdomen: soft/nontender/nondistended/normal bowel sounds. No rebound or guarding.  Ext: trace edema Skin: warm, dry Neuro: using cane to take pressure off his left knee Msk: left knee brace, wearing ulnar splint right hand, left wrist brance   Assessment/Plan:  Other notes 1. Last visit missed last  may due to kidney stone and treatment- ended up being 12 mm stone 2. Left knee bothering him- did 2 steroid shots with murphy wainer. Then flexogenics and had another shot with them.  3. Then slipped down 12 stairs and had significant injury- broke left wrist, fractured 2 fingers on right. Also orbital fracture that didn't need intervention.  4. He did not ask for xanax today - with fall risk think its ideal to avoid so I did not bring this up with him  Hyperlipidemia S: reasonably controlled on  crestor 10mg  in past- 2017 last labs. Out of crestor for a week A/P: refilled crestor- need to get him back for labs  Hyperglycemia S: down 10 lbs from last visit with me 2017. He has cut down on sweets. 2 fried eggs, toast, glass of OJ for breakfast- he thinks this has helped him. Cutting down on soft drink- doing more water Lab Results  Component Value Date   HGBA1C 5.8 07/28/2016  A/P: will update a1c, discussed stopping OJ could help as well.   Elevated PSA S: advised 6 month repeat PSA in 2017 Lab Results  Component Value Date   PSA 3.07 07/28/2016   PSA 1.72 10/16/2010   PSA 1.38 04/18/2009  A/P: up date psa- he is already plugged in with urology  Atherosclerosis of coronary artery S: history 2 stents. On aspirin and statin (but was out of statin 1 week). Still with [place] [facility] for now A/P: continue current rx  6 months  Lab/Order associations: Hyperlipidemia, unspecified hyperlipidemia type - Plan: LDL cholesterol, direct, CBC, Comprehensive metabolic panel  Hyperglycemia - Plan: Hemoglobin A1c  Elevated PSA - Plan: PSA  Meds ordered this encounter  Medications  . tadalafil (CIALIS) 20 MG tablet    Sig: TAKE ONE-HALF (1/2) TABLET DAILY AS NEEDED FOR ERECTILE DYSFUNCTION    Dispense:  8 tablet    Refill:  3  . rosuvastatin (CRESTOR) 10 MG tablet    Sig: Take 1 tablet (10 mg total) by mouth daily at 6 PM.    Dispense:  90 tablet    Refill:  3  . omeprazole (PRILOSEC) 20 MG capsule    Sig: TAKE 1 CAPSULE DAILY    Dispense:  90 capsule    Refill:  3    Return precautions advised.  [name], MD

## 2017-12-19 NOTE — Assessment & Plan Note (Signed)
S: reasonably controlled on crestor 10mg  in past- 2017 last labs. Out of crestor for a week A/P: refilled crestor- need to get him back for labs

## 2017-12-19 NOTE — Patient Instructions (Addendum)
Please stop by lab before you go  No changes today- refilled meds as requested

## 2017-12-20 ENCOUNTER — Telehealth: Payer: Self-pay | Admitting: Family Medicine

## 2017-12-20 DIAGNOSIS — S62652A Nondisplaced fracture of medial phalanx of right middle finger, initial encounter for closed fracture: Secondary | ICD-10-CM | POA: Diagnosis not present

## 2017-12-20 DIAGNOSIS — S62654A Nondisplaced fracture of medial phalanx of right ring finger, initial encounter for closed fracture: Secondary | ICD-10-CM | POA: Diagnosis not present

## 2017-12-20 DIAGNOSIS — S62656A Nondisplaced fracture of medial phalanx of right little finger, initial encounter for closed fracture: Secondary | ICD-10-CM | POA: Diagnosis not present

## 2017-12-20 DIAGNOSIS — S52572A Other intraarticular fracture of lower end of left radius, initial encounter for closed fracture: Secondary | ICD-10-CM | POA: Diagnosis not present

## 2017-12-20 LAB — CBC
HEMATOCRIT: 43.4 % (ref 39.0–52.0)
HEMOGLOBIN: 14.7 g/dL (ref 13.0–17.0)
MCHC: 33.9 g/dL (ref 30.0–36.0)
MCV: 86.4 fl (ref 78.0–100.0)
Platelets: 277 10*3/uL (ref 150.0–400.0)
RBC: 5.02 Mil/uL (ref 4.22–5.81)
RDW: 13.5 % (ref 11.5–15.5)
WBC: 5.7 10*3/uL (ref 4.0–10.5)

## 2017-12-20 LAB — COMPREHENSIVE METABOLIC PANEL
ALT: 15 U/L (ref 0–53)
AST: 15 U/L (ref 0–37)
Albumin: 3.5 g/dL (ref 3.5–5.2)
Alkaline Phosphatase: 130 U/L — ABNORMAL HIGH (ref 39–117)
BUN: 11 mg/dL (ref 6–23)
CO2: 24 mEq/L (ref 19–32)
Calcium: 9.1 mg/dL (ref 8.4–10.5)
Chloride: 103 mEq/L (ref 96–112)
Creatinine, Ser: 0.87 mg/dL (ref 0.40–1.50)
GFR: 91.09 mL/min (ref >60.00)
Glucose, Bld: 111 mg/dL — ABNORMAL HIGH (ref 70–99)
POTASSIUM: 3.7 meq/L (ref 3.5–5.1)
SODIUM: 137 meq/L (ref 135–145)
Total Bilirubin: 0.6 mg/dL (ref 0.2–1.2)
Total Protein: 6.4 g/dL (ref 6.0–8.3)

## 2017-12-20 LAB — LDL CHOLESTEROL, DIRECT: Direct LDL: 100 mg/dL

## 2017-12-20 LAB — PSA: PSA: 4.25 ng/mL — ABNORMAL HIGH (ref 0.10–4.00)

## 2017-12-20 LAB — HEMOGLOBIN A1C: Hgb A1c MFr Bld: 5.9 % (ref 4.6–6.5)

## 2017-12-20 NOTE — Telephone Encounter (Signed)
Copied from [place] [id number]. Topic: Quick Communication - See Telephone Encounter >> Dec 20, 2017  4:50 PM Valla Leaver wrote: CRM for notification. See Telephone encounter for: 12/20/17. [facility] says [facility] needs Dr. Yong Channel to call them to clarify 2 of the 3 scripts he sent for the patient yesterday. Patient does not know which medication.

## 2017-12-20 NOTE — Telephone Encounter (Signed)
Please advise 

## 2017-12-20 NOTE — Progress Notes (Signed)
Your PSA has risen now above 4.  Above this level I generally like to get the opinion of urology.  Team can you please refer Jose [name] to urology under elevated PSA.  Sometimes above age 75 they do not advise further evaluation but I at least want their opinion. Your cholesterol is slightly high for someone that has stents. I would like to increase you rosuvastatin to 20 mg if you agree- #90 with 3 refills.   Your CBC was normal (blood counts, infection fighting cells, platelets). Your CMET was largely normal (kidney, liver, and electrolytes, blood sugar) except for slightly high blood sugar. At risk for diabetes with hemoglobin a1c of 5.9 (at risk from 5.7-6.4). Healthy eating, regular exercise, weight loss advised. Above 6 we could consider metformin to help prevent diabetes.Your thyroid was normal.

## 2017-12-21 ENCOUNTER — Other Ambulatory Visit: Payer: Self-pay

## 2017-12-21 DIAGNOSIS — R972 Elevated prostate specific antigen [PSA]: Secondary | ICD-10-CM

## 2017-12-21 MED ORDER — ROSUVASTATIN CALCIUM 20 MG PO TABS: 20.0000 mg | ORAL_TABLET | Freq: Every day | ORAL | 3 refills | Status: DC

## 2017-12-21 NOTE — Telephone Encounter (Signed)
[facility]ise has sent in patients medication today

## 2017-12-22 ENCOUNTER — Telehealth: Payer: Self-pay

## 2017-12-22 NOTE — Telephone Encounter (Signed)
PA for Cialis approved.  Patient's pharmacy notified.

## 2017-12-27 DIAGNOSIS — R972 Elevated prostate specific antigen [PSA]: Secondary | ICD-10-CM | POA: Diagnosis not present

## 2017-12-30 DIAGNOSIS — M1712 Unilateral primary osteoarthritis, left knee: Secondary | ICD-10-CM | POA: Diagnosis not present

## 2018-01-02 ENCOUNTER — Ambulatory Visit: Payer: Medicare Other | Admitting: Family Medicine

## 2018-01-03 DIAGNOSIS — S62654D Nondisplaced fracture of medial phalanx of right ring finger, subsequent encounter for fracture with routine healing: Secondary | ICD-10-CM | POA: Diagnosis not present

## 2018-01-03 DIAGNOSIS — S52572D Other intraarticular fracture of lower end of left radius, subsequent encounter for closed fracture with routine healing: Secondary | ICD-10-CM | POA: Diagnosis not present

## 2018-01-03 DIAGNOSIS — S62652D Nondisplaced fracture of medial phalanx of right middle finger, subsequent encounter for fracture with routine healing: Secondary | ICD-10-CM | POA: Diagnosis not present

## 2018-01-03 DIAGNOSIS — S63282D Dislocation of proximal interphalangeal joint of right middle finger, subsequent encounter: Secondary | ICD-10-CM | POA: Diagnosis not present

## 2018-01-03 DIAGNOSIS — S62656D Nondisplaced fracture of medial phalanx of right little finger, subsequent encounter for fracture with routine healing: Secondary | ICD-10-CM | POA: Diagnosis not present

## 2018-01-09 DIAGNOSIS — S62654D Nondisplaced fracture of medial phalanx of right ring finger, subsequent encounter for fracture with routine healing: Secondary | ICD-10-CM | POA: Diagnosis not present

## 2018-01-09 DIAGNOSIS — S63282D Dislocation of proximal interphalangeal joint of right middle finger, subsequent encounter: Secondary | ICD-10-CM | POA: Diagnosis not present

## 2018-01-09 DIAGNOSIS — S62656D Nondisplaced fracture of medial phalanx of right little finger, subsequent encounter for fracture with routine healing: Secondary | ICD-10-CM | POA: Diagnosis not present

## 2018-01-09 DIAGNOSIS — S62652D Nondisplaced fracture of medial phalanx of right middle finger, subsequent encounter for fracture with routine healing: Secondary | ICD-10-CM | POA: Diagnosis not present

## 2018-01-09 DIAGNOSIS — S52572D Other intraarticular fracture of lower end of left radius, subsequent encounter for closed fracture with routine healing: Secondary | ICD-10-CM | POA: Diagnosis not present

## 2018-01-13 DIAGNOSIS — M1812 Unilateral primary osteoarthritis of first carpometacarpal joint, left hand: Secondary | ICD-10-CM | POA: Diagnosis not present

## 2018-01-13 DIAGNOSIS — S62652D Nondisplaced fracture of medial phalanx of right middle finger, subsequent encounter for fracture with routine healing: Secondary | ICD-10-CM | POA: Diagnosis not present

## 2018-01-13 DIAGNOSIS — S52572D Other intraarticular fracture of lower end of left radius, subsequent encounter for closed fracture with routine healing: Secondary | ICD-10-CM | POA: Diagnosis not present

## 2018-01-13 DIAGNOSIS — S62656D Nondisplaced fracture of medial phalanx of right little finger, subsequent encounter for fracture with routine healing: Secondary | ICD-10-CM | POA: Diagnosis not present

## 2018-01-13 DIAGNOSIS — S62654D Nondisplaced fracture of medial phalanx of right ring finger, subsequent encounter for fracture with routine healing: Secondary | ICD-10-CM | POA: Diagnosis not present

## 2018-01-18 DIAGNOSIS — M25432 Effusion, left wrist: Secondary | ICD-10-CM | POA: Diagnosis not present

## 2018-01-18 DIAGNOSIS — M79644 Pain in right finger(s): Secondary | ICD-10-CM | POA: Diagnosis not present

## 2018-01-18 DIAGNOSIS — S62654D Nondisplaced fracture of medial phalanx of right ring finger, subsequent encounter for fracture with routine healing: Secondary | ICD-10-CM | POA: Diagnosis not present

## 2018-01-18 DIAGNOSIS — S62652D Nondisplaced fracture of medial phalanx of right middle finger, subsequent encounter for fracture with routine healing: Secondary | ICD-10-CM | POA: Diagnosis not present

## 2018-01-18 DIAGNOSIS — S63282D Dislocation of proximal interphalangeal joint of right middle finger, subsequent encounter: Secondary | ICD-10-CM | POA: Diagnosis not present

## 2018-01-18 DIAGNOSIS — S52572D Other intraarticular fracture of lower end of left radius, subsequent encounter for closed fracture with routine healing: Secondary | ICD-10-CM | POA: Diagnosis not present

## 2018-01-18 DIAGNOSIS — S62656D Nondisplaced fracture of medial phalanx of right little finger, subsequent encounter for fracture with routine healing: Secondary | ICD-10-CM | POA: Diagnosis not present

## 2018-01-18 DIAGNOSIS — M25641 Stiffness of right hand, not elsewhere classified: Secondary | ICD-10-CM | POA: Diagnosis not present

## 2018-01-18 DIAGNOSIS — M25532 Pain in left wrist: Secondary | ICD-10-CM | POA: Diagnosis not present

## 2018-01-24 DIAGNOSIS — M1812 Unilateral primary osteoarthritis of first carpometacarpal joint, left hand: Secondary | ICD-10-CM | POA: Diagnosis not present

## 2018-01-24 DIAGNOSIS — S62656D Nondisplaced fracture of medial phalanx of right little finger, subsequent encounter for fracture with routine healing: Secondary | ICD-10-CM | POA: Diagnosis not present

## 2018-01-24 DIAGNOSIS — S62652D Nondisplaced fracture of medial phalanx of right middle finger, subsequent encounter for fracture with routine healing: Secondary | ICD-10-CM | POA: Diagnosis not present

## 2018-01-24 DIAGNOSIS — S62654D Nondisplaced fracture of medial phalanx of right ring finger, subsequent encounter for fracture with routine healing: Secondary | ICD-10-CM | POA: Diagnosis not present

## 2018-01-24 DIAGNOSIS — S52572D Other intraarticular fracture of lower end of left radius, subsequent encounter for closed fracture with routine healing: Secondary | ICD-10-CM | POA: Diagnosis not present

## 2018-02-15 ENCOUNTER — Telehealth: Payer: Self-pay | Admitting: Gastroenterology

## 2018-02-15 NOTE — Telephone Encounter (Signed)
I do not see any correspondance in patient chart about surgical clearance. Left message for patient to call back

## 2018-02-16 NOTE — Telephone Encounter (Signed)
If this is for medical clearance for surgery this should be directed to his PCP.  In a brief review of the chart I don't see any GI problems that would impact operative risk.

## 2018-02-16 NOTE — Telephone Encounter (Signed)
Patient notified That I am returning the form and this note today

## 2018-02-16 NOTE — Telephone Encounter (Signed)
Patient has had clearance form faxed and is in the folder on your desk. Please review at your convenience.

## 2018-02-22 DIAGNOSIS — S62652D Nondisplaced fracture of medial phalanx of right middle finger, subsequent encounter for fracture with routine healing: Secondary | ICD-10-CM | POA: Diagnosis not present

## 2018-02-22 DIAGNOSIS — S52572D Other intraarticular fracture of lower end of left radius, subsequent encounter for closed fracture with routine healing: Secondary | ICD-10-CM | POA: Diagnosis not present

## 2018-02-22 DIAGNOSIS — S62654D Nondisplaced fracture of medial phalanx of right ring finger, subsequent encounter for fracture with routine healing: Secondary | ICD-10-CM | POA: Diagnosis not present

## 2018-02-22 DIAGNOSIS — S62656D Nondisplaced fracture of medial phalanx of right little finger, subsequent encounter for fracture with routine healing: Secondary | ICD-10-CM | POA: Diagnosis not present

## 2018-02-24 DIAGNOSIS — M1712 Unilateral primary osteoarthritis, left knee: Secondary | ICD-10-CM | POA: Diagnosis not present

## 2018-02-27 DIAGNOSIS — M1712 Unilateral primary osteoarthritis, left knee: Secondary | ICD-10-CM | POA: Diagnosis not present

## 2018-02-27 DIAGNOSIS — Z6831 Body mass index (BMI) 31.0-31.9, adult: Secondary | ICD-10-CM | POA: Diagnosis not present

## 2018-02-27 NOTE — H&P (Signed)
KNEE ARTHROPLASTY ADMISSION H&P  Patient ID: Jose [name] MRN: [id number] DOB/AGE: Jan 04, 1941 77 y.o.  Chief Complaint: left knee pain.  Planned Procedure Date: 03/21/18 Medical Clearance by Dr. Yong Channel   Cardiac Clearance by Dr. Donnetta Hutching  Additional clearance by GI: Dr. Fuller Plan   HPI: Jose [name] is a 76 y.o. male with a history of Anxiety, diverticulosis, HLD, HTN, CAD/MI s/p stenting, and barrett's esophagus.  He presents for evaluation of OA LEFT KNEE. The patient has a history of pain and functional disability in the left knee due to arthritis and has failed non-surgical conservative treatments for greater than 12 weeks to include corticosteriod injections, viscosupplementation injections and activity modification.  Onset of symptoms was gradual, starting 2 + years ago with gradually worsening course since that time.  Patient currently rates pain at 8 out of 10 with activity. Patient has night pain, worsening of pain with activity and weight bearing and pain that interferes with activities of daily living.  Patient has evidence of periarticular osteophytes and joint space narrowing by imaging studies.  There is no active infection.  Past Medical History:  Diagnosis Date  . Acute lower GI bleeding 01/25/2013; 09/2014   diverticular bleed  . ANXIETY 08/22/2008  . Arthritis    "a little bit" (04/14/2015)  . Barrett's esophagus 2003   hematochezia as a result since 2003  . Basal cell cancer    removed from right forearm.   . Bradycardia 04/14/2015  . CORONARY ARTERY DISEASE    2 stents  . DIVERTICULOSIS, COLON 2003  . ERECTILE DYSFUNCTION 08/22/2008  . GERD (gastroesophageal reflux disease)   . Heart murmur 04/14/2015   "they think I did; that's why I'm here" (04/14/2015)  . History of kidney stones   . HYPERLIPIDEMIA 02/09/2007  . Kidney stones    "blasted or passed" (04/14/2015)  . MI (myocardial infarction) ([place]) 2000  . Numbness    right side - history of   . OBESITY  03/20/2010  . 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 BASAL CELL CARCINOMA EXCISION     posterior right ear.   Marland Kitchen CATARACT EXTRACTION, BILATERAL    . COLONOSCOPY N/A 01/26/2013   Procedure: COLONOSCOPY;  Surgeon: Milus Banister, MD;  Location: [facility];  Service: Endoscopy;  Laterality: N/A;  . CORONARY ANGIOPLASTY WITH STENT PLACEMENT  09/06/1998   LAD stent per Dr Elonda Husky in [place]. 2 stents.   . CYSTOSCOPY W/ URETERAL STENT PLACEMENT Left 12/21/2016   Procedure: CYSTOSCOPY WITH RETROGRADE PYELOGRAM/URETERAL STENT PLACEMENT;  Surgeon: Alexis Frock, MD;  Location: [facility];  Service: Urology;  Laterality: Left;  . ESOPHAGOGASTRODUODENOSCOPY (EGD) WITH ESOPHAGEAL DILATION     "q time I have an EGD; usually q 3-4 years" (04/14/2015)  . EXTRACORPOREAL SHOCK WAVE LITHOTRIPSY Left 12/30/2016   Procedure: LEFT EXTRACORPOREAL SHOCK WAVE LITHOTRIPSY (ESWL);  Surgeon: Carolan Clines, MD;  Location: [facility];  Service: Urology;  Laterality: Left;  . LITHOTRIPSY  X 2  . SQUAMOUS CELL CARCINOMA EXCISION     right forearm  . TONSILLECTOMY  1971  . UPPER GASTROINTESTINAL ENDOSCOPY     Allergies  Allergen Reactions  . Atorvastatin     REACTION: muscle weakness   Prior to Admission medications   Medication Sig Start Date End Date Taking? Authorizing Provider  acetaminophen (TYLENOL) 500 MG tablet Take 1,000 mg by mouth every 6 (six) hours  as needed (pain).    [provider]  ALPRAZolam Duanne Moron) 0.25 MG tablet TAKE 1 TABLET BY MOUTH EVERY DAY (MAX 3 DOSES PER WEEK) 05/27/16   Marin Olp, MD  aspirin 81 MG EC tablet Take 1 tablet (81 mg total) by mouth daily. Swallow whole. 10/18/14   Hosie Poisson, MD  diphenhydramine-acetaminophen (TYLENOL PM) 25-500 MG TABS tablet Take 1 tablet by mouth at bedtime as needed (sleep, pain).    [provider]  omeprazole (PRILOSEC) 20 MG capsule  TAKE 1 CAPSULE DAILY 12/19/17   Marin Olp, MD  oxyCODONE-acetaminophen (PERCOCET/ROXICET) 5-325 MG tablet Take 1-2 tablets by mouth every 8 (eight) hours as needed for severe pain. 12/08/17   Volanda Napoleon, PA-C  rosuvastatin (CRESTOR) 20 MG tablet Take 1 tablet (20 mg total) by mouth daily. 12/21/17   Marin Olp, MD  senna-docusate (SENOKOT-S) 8.6-50 MG tablet Take 1 tablet by mouth 2 (two) times daily. While taking strongest pain meds to prevent constipation. 12/21/16   Alexis Frock, MD  tadalafil (CIALIS) 20 MG tablet TAKE ONE-HALF (1/2) TABLET DAILY AS NEEDED FOR ERECTILE DYSFUNCTION 12/19/17   Marin Olp, MD  traMADol (ULTRAM) 50 MG tablet Take 1-2 tablets (50-100 mg total) by mouth every 6 (six) hours as needed for severe pain. Post-operatively 12/21/16   Alexis Frock, MD   Social History   Socioeconomic History  . Marital status: Married    Spouse name: Not on file  . Number of children: Not on file  . Years of education: Not on file  . Highest education level: Not on file  Occupational History  . Not on file  Social Needs  . Financial resource strain: Not on file  . Food insecurity:    Worry: Not on file    Inability: Not on file  . Transportation needs:    Medical: Not on file    Non-medical: Not on file  Tobacco Use  . Smoking status: Former Smoker    Packs/day: 1.00    Years: 30.00    Pack years: 30.00    Types: Cigarettes  . Smokeless tobacco: Never Used  . Tobacco comment: "quit smoking cigarettes in ~ 1990"  Substance and Sexual Activity  . Alcohol use: Yes    Comment: 04/14/2015 "might drink a couple beers/month"  . Drug use: No  . Sexual activity: Never  Lifestyle  . Physical activity:    Days per week: Not on file    Minutes per session: Not on file  . Stress: Not on file  Relationships  . Social connections:    Talks on phone: Not on file    Gets together: Not on file    Attends religious service: Not on file    Active member  of club or organization: Not on file    Attends meetings of clubs or organizations: Not on file    Relationship status: Not on file  Other Topics Concern  . Not on file  Social History Narrative   Still working at [employer]   Married 35 years in 2015, 4 kids from first marriage, 3 grandkids   Hobbies: woodworking, tv movies- old action movies like Rambo   Family History  Problem Relation Age of Onset  . Alcohol abuse Father   . Liver disease Father   . Heart disease Brother        CABG  . Colon cancer Neg Hx     ROS: Currently denies lightheadedness, dizziness, Fever, chills,  CP, SOB.   No personal history of DVT, PE.  MI 2008 with stenting.  Possible TIA 2016. No loose teeth or dentures All other systems have been reviewed and were otherwise currently negative with the exception of those mentioned in the HPI and as above.  Objective: Vitals: Ht: 5 feet 8 inches wt: 205 temp: 97.5 BP: 138/79 pulse: 60 O2 95 % on room air.   Physical Exam: General: Alert, NAD.  Antalgic Gait.  Utilizes a cane. HEENT: EOMI, Good Neck Extension  Pulm: No increased work of breathing.  Clear B/L A/P w/o crackle or wheeze.  CV: RRR, No m/g/r appreciated  GI: soft, NT, ND Neuro: Neuro without gross focal deficit.  Sensation intact distally Skin: No lesions in the area of chief complaint MSK/Surgical Site: Left knee w/o redness or effusion.  Mainly medial JLT. ROM 10 to 95 degrees.  5/5 strength in extension and flexion.  +EHL/FHL.  NVI.  Stable varus and valgus stress.    Imaging Review Plain radiographs demonstrate severe degenerative joint disease of the left knee.   Preoperative templating of the joint replacement has been completed, documented, and submitted to the Operating Room personnel in order to optimize intra-operative equipment management.  Assessment: OA LEFT KNEE Principal Problem:   Primary osteoarthritis of left knee Active Problems:   Hyperlipidemia   Atherosclerosis  of coronary artery   Barrett's esophagus   Hyperglycemia   Elevated PSA   Hypertension   Plan: Plan for Procedure(s): LEFT TOTAL KNEE ARTHROPLASTY  The patient history, physical exam, clinical judgement of the provider and imaging are consistent with end stage degenerative joint disease and total joint arthroplasty is deemed medically necessary. The treatment options including medical management, injection therapy, and arthroplasty were discussed at length. The risks and benefits of Procedure(s): LEFT TOTAL KNEE ARTHROPLASTY were presented and reviewed.  The risks of nonoperative treatment, versus surgical intervention including but not limited to continued pain, aseptic loosening, stiffness, dislocation/subluxation, infection, bleeding, nerve injury, blood clots, cardiopulmonary complications, morbidity, mortality, among others were discussed. The patient verbalizes understanding and wishes to proceed with the plan.  Patient is being admitted for inpatient treatment for surgery, pain control, PT, OT, prophylactic antibiotics, VTE prophylaxis, progressive ambulation, ADL's and discharge planning.   Dental prophylaxis discussed and recommended for 2 years postoperatively.    The patient does meet the criteria for TXA which will be used perioperatively.    ASA 81 mg BID will be used postoperatively for DVT prophylaxis in addition to SCDs, and early ambulation.  Continue omeprazole for GERD/gastric protection  Takes meloxicam for inflammation.  The patient is planning to be discharged home with home health services ([facility]) in care of his wife.  Patient's anticipated LOS is less than 2 midnights, meeting these requirements: - Lives within 1 hour of care - Has a competent adult at home to recover with post-op recover - NO history of  - Diabetes  - Respiratory Failure/COPD  - Renal failure  - Anemia  - Advanced Liver disease   Prudencio Burly III, PA-C 02/27/2018 8:21  AM

## 2018-03-09 NOTE — Pre-Procedure Instructions (Signed)
Jose [name]  03/09/2018      [id number] [address], [place] [address] Phone: [phone] Fax: [phone]    Your procedure is scheduled on July 23  Report to [facility] Admitting at 1:00 P.M.  Call this number if you have problems the morning of surgery:  [phone]   Remember:   Do not eat or drink after midnight.                       Take these medicines the morning of surgery with A SIP OF WATER :                         Tylenol if needed               Omeprazole (prilosec)                   7 days prior to surgery STOP taking  Aleve, Naproxen, Ibuprofen, Motrin, Advil, Goody's, BC's, all herbal medications, fish oil, and all vitamins                            Follow your doctors instructions regarding your Aspirin.  If no instructions were given by your doctor, then you will need to call the prescribing office office to get instructions.       Do not wear jewelry.  Do not wear lotions, powders, or perfumes, or deodorant.  Do not shave 48 hours prior to surgery.  Men may shave face and neck.  Do not bring valuables to the hospital.  [facility] is not responsible for any belongings or valuables.  Contacts, dentures or bridgework may not be worn into surgery.  Leave your suitcase in the car.  After surgery it may be brought to your room.  For patients admitted to the hospital, discharge time will be determined by your treatment team.  Patients discharged the day of surgery will not be allowed to drive home.    Special instructions:  [place]- Preparing For Surgery  Before surgery, you can play an important role. Because skin is not sterile, your skin needs to be as free of germs as possible. You can reduce the number of germs on your skin by washing with CHG (chlorahexidine gluconate) Soap before surgery.  CHG is an antiseptic cleaner which kills germs and bonds with the  skin to continue killing germs even after washing.    Oral Hygiene is also important to reduce your risk of infection.  Remember - BRUSH YOUR TEETH THE MORNING OF SURGERY WITH YOUR REGULAR TOOTHPASTE  Please do not use if you have an allergy to CHG or antibacterial soaps. If your skin becomes reddened/irritated stop using the CHG.  Do not shave (including legs and underarms) for at least 48 hours prior to first CHG shower. It is OK to shave your face.  Please follow these instructions carefully.   1. Shower the NIGHT BEFORE SURGERY and the MORNING OF SURGERY with CHG.   2. If you chose to wash your hair, wash your hair first as usual with your normal shampoo.  3. After you shampoo, rinse your hair and body thoroughly to remove the shampoo.  4. Use CHG as you would any other liquid soap. You can apply CHG directly to the skin and wash gently  with a scrungie or a clean washcloth.   5. Apply the CHG Soap to your body ONLY FROM THE NECK DOWN.  Do not use on open wounds or open sores. Avoid contact with your eyes, ears, mouth and genitals (private parts). Wash Face and genitals (private parts)  with your normal soap.  6. Wash thoroughly, paying special attention to the area where your surgery will be performed.  7. Thoroughly rinse your body with warm water from the neck down.  8. DO NOT shower/wash with your normal soap after using and rinsing off the CHG Soap.  9. Pat yourself dry with a CLEAN TOWEL.  10. Wear CLEAN PAJAMAS to bed the night before surgery, wear comfortable clothes the morning of surgery  11. Place CLEAN SHEETS on your bed the night of your first shower and DO NOT SLEEP WITH PETS.    Day of Surgery:  Do not apply any deodorants/lotions.  Please wear clean clothes to the hospital/surgery center.   Remember to brush your teeth WITH YOUR REGULAR TOOTHPASTE.    Please read over the following fact sheets that you were given. Coughing and Deep Breathing, MRSA  Information and Surgical Site Infection Prevention

## 2018-03-10 ENCOUNTER — Encounter (HOSPITAL_COMMUNITY): Payer: Self-pay

## 2018-03-10 ENCOUNTER — Encounter (HOSPITAL_COMMUNITY)
Admission: RE | Admit: 2018-03-10 | Discharge: 2018-03-10 | Disposition: A | Discharge status: Home / Self Care | Payer: Medicare Other | Source: Ambulatory Visit | Attending: Orthopedic Surgery | Admitting: Orthopedic Surgery

## 2018-03-10 ENCOUNTER — Other Ambulatory Visit: Payer: Self-pay | Admitting: Orthopedic Surgery

## 2018-03-10 ENCOUNTER — Other Ambulatory Visit: Payer: Self-pay

## 2018-03-10 DIAGNOSIS — I2510 Atherosclerotic heart disease of native coronary artery without angina pectoris: Secondary | ICD-10-CM | POA: Diagnosis not present

## 2018-03-10 DIAGNOSIS — Z01812 Encounter for preprocedural laboratory examination: Secondary | ICD-10-CM | POA: Insufficient documentation

## 2018-03-10 DIAGNOSIS — R001 Bradycardia, unspecified: Secondary | ICD-10-CM | POA: Diagnosis not present

## 2018-03-10 DIAGNOSIS — M1712 Unilateral primary osteoarthritis, left knee: Secondary | ICD-10-CM | POA: Diagnosis not present

## 2018-03-10 DIAGNOSIS — I10 Essential (primary) hypertension: Secondary | ICD-10-CM | POA: Diagnosis not present

## 2018-03-10 DIAGNOSIS — K219 Gastro-esophageal reflux disease without esophagitis: Secondary | ICD-10-CM | POA: Insufficient documentation

## 2018-03-10 DIAGNOSIS — Z01810 Encounter for preprocedural cardiovascular examination: Secondary | ICD-10-CM | POA: Insufficient documentation

## 2018-03-10 DIAGNOSIS — Z955 Presence of coronary angioplasty implant and graft: Secondary | ICD-10-CM | POA: Insufficient documentation

## 2018-03-10 HISTORY — DX: Cerebral infarction, unspecified: I63.9

## 2018-03-10 HISTORY — DX: Depression, unspecified: F32.A

## 2018-03-10 HISTORY — DX: Major depressive disorder, single episode, unspecified: F32.9

## 2018-03-10 LAB — BASIC METABOLIC PANEL
ANION GAP: 9 (ref 5–15)
BUN: 10 mg/dL (ref 8–23)
CALCIUM: 9.3 mg/dL (ref 8.9–10.3)
CO2: 24 mmol/L (ref 22–32)
CREATININE: 1.03 mg/dL (ref 0.61–1.24)
Chloride: 109 mmol/L (ref 98–111)
GFR calc Af Amer: >60 mL/min (ref >60)
GFR calc non Af Amer: >60 mL/min (ref >60)
Glucose, Bld: 90 mg/dL (ref 70–99)
Potassium: 4.1 mmol/L (ref 3.5–5.1)
SODIUM: 142 mmol/L (ref 135–145)

## 2018-03-10 LAB — CBC
HCT: 46.6 % (ref 39.0–52.0)
HEMOGLOBIN: 15.2 g/dL (ref 13.0–17.0)
MCH: 28.7 pg (ref 26.0–34.0)
MCHC: 32.6 g/dL (ref 30.0–36.0)
MCV: 88.1 fL (ref 78.0–100.0)
PLATELETS: 151 10*3/uL (ref 150–400)
RBC: 5.29 MIL/uL (ref 4.22–5.81)
RDW: 13.3 % (ref 11.5–15.5)
WBC: 5.6 10*3/uL (ref 4.0–10.5)

## 2018-03-10 LAB — SURGICAL PCR SCREEN
MRSA, PCR: NEGATIVE
Staphylococcus aureus: NEGATIVE

## 2018-03-10 NOTE — Progress Notes (Signed)
   03/10/18 1342  OBSTRUCTIVE SLEEP APNEA  Have you ever been diagnosed with sleep apnea through a sleep study? No  Do you snore loudly (loud enough to be heard through closed doors)?  1  Do you often feel tired, fatigued, or sleepy during the daytime (such as falling asleep during driving or talking to someone)? 0  Has anyone observed you stop breathing during your sleep? 0  Do you have, or are you being treated for high blood pressure? 1  BMI more than 35 kg/m2? 0  Age > 50 (1-yes) 1  Neck circumference greater than:Male 16 inches or larger, Male 17inches or larger? 1  Male Gender (Yes=1) 1  Obstructive Sleep Apnea Score 5  Score 5 or greater  Results sent to PCP

## 2018-03-10 NOTE — Care Plan (Signed)
Spoke with patient. States he will discharge to home with the following.   DME:  CPM, RW to be delivered to home from [place] prior to surgery   HHPT: [facility]: [place] 04/05/18 prior to MD visit  MD follow up:  04/05/18 @ 400.   Please contact Ladell Heads, [place] with questions or if this plan should need to change.   Thanks

## 2018-03-13 NOTE — Progress Notes (Signed)
Anesthesia Chart Review:  Case:  [id number] Date/Time:  03/21/18 1445   Procedure:  LEFT TOTAL KNEE ARTHROPLASTY (Left Knee)   Anesthesia type:  Choice   Pre-op diagnosis:  OA LEFT KNEE   Location:  [facility] OR ROOM 17 / [facility]   Surgeon:  Renette Butters, MD      DISCUSSION: 77 yo male former smoker scheduled for above procedure. Pertinent medical hx includes anxiety, depression, CAD s/p stent x 2 in 2000, GERD (barrett's esophagus), TIA, obesity, bradycardia, GIB. Recent orthopedic injuries s/p mechanical fall 12/08/2017: broke left wrist, fractured 2 fingers on right. Also orbital fracture that didn't need intervention.  He has clearance from PCP Dr. Yong Channel 01/24/2018, Cardiology Dr. Donnetta Hutching 01/24/2018, GI Dr. Fuller Plan 02/16/2018.  Anticipate he can proceed with surgery as planned barring acute status change.  VS: BP 130/81   Pulse 62   Temp 36.6 C   Resp 20   Ht 5' 9.5" (1.765 m)   Wt 205 lb 6.4 oz (93.2 kg)   SpO2 96%   BMI 29.90 kg/m   PROVIDERS: Marin Olp, MD is PCP last seen 12/19/2017  Ricky Ala, MD is Cardiologist last seen 03/24/2017  LABS: Labs reviewed: Acceptable for surgery. (all labs ordered are listed, but only abnormal results are displayed)  Labs Reviewed  SURGICAL PCR SCREEN  BASIC METABOLIC PANEL  CBC     IMAGES: CT Angio 12/08/2017 IMPRESSION: 1.  Negative for acute pulmonary embolus. 2. Suspected mild upper thoracic compression fractures at T1 through T3 are new since 2016 and may be acute in this clinical setting. No complicating features. Noncontrast Thoracic MRI would confirm. 3. No other acute traumatic injury identified in the chest. 4. Low lung volumes with pulmonary atelectasis. 5. Cardiomegaly with calcified coronary artery and Aortic Atherosclerosis (ICD10-I70.0).  EKG: 03/10/2018: Sinus bradycardia with Premature supraventricular complexes Otherwise normal ECG  CV: ECHO 04/15/2015 Study Conclusions - Left ventricle: The cavity size  was normal. Wall thickness was   increased in a pattern of mild LVH. Basal anterolateral   hypokinesis. The estimated ejection fraction was 55%. Doppler   parameters are consistent with abnormal left ventricular   relaxation (grade 1 diastolic dysfunction). - Aortic valve: There was no stenosis. - Aorta: Borderline dilated aortic root. Aortic root dimension: 37   mm (ED). - Mitral valve: Mildly calcified annulus. Mildly calcified leaflets   . There was trivial regurgitation. - Left atrium: The atrium was mildly dilated. - Right ventricle: The cavity size was mildly dilated. Systolic   function was normal. - Tricuspid valve: Peak RV-RA gradient (S): 27 mm Hg. - Systemic veins: IVC not visualized. Impressions: - Normal LV size with mild LV hypertrophy. EF 55% with basal   anterolateral hypokinesis. Mildly dilated RV with normal systolic   function.  Carotid US 04/15/2015 Findings suggest 1-39% internal carotid artery stenosis bilaterally. The right vertebral artery appears to be retrograde with an atypical thumpy waveform which mat suggest high grade proximal vertebral or sybclavian stenosis.. The left vertebral artery is patent with antegrade flow.   Past Medical History:  Diagnosis Date  . Acute lower GI bleeding 01/25/2013; 09/2014   diverticular bleed  . ANXIETY 08/22/2008  . Arthritis    "a little bit" (04/14/2015)  . Barrett's esophagus 2003   hematochezia as a result since 2003  . Basal cell cancer    removed from right forearm.   . Bradycardia 04/14/2015  . CORONARY ARTERY DISEASE    2 stents  . Depression   .  DIVERTICULOSIS, COLON 2003  . ERECTILE DYSFUNCTION 08/22/2008  . GERD (gastroesophageal reflux disease)   . Heart murmur 04/14/2015   "they think I did; that's why I'm here" (04/14/2015)  . History of kidney stones   . HYPERLIPIDEMIA 02/09/2007  . Kidney stones    "blasted or passed" (04/14/2015)  . MI (myocardial infarction) ([place]) 2000  . Numbness    right  side - history of   . OBESITY 03/20/2010  . Squamous cell cancer of skin of earlobe    removed from area behind right earlobe  . Stroke [facility])    4 yrs ago possible mini stroke -  tests never found anything    Past Surgical History:  Procedure Laterality Date  . BASAL CELL CARCINOMA EXCISION     posterior right ear.   Marland Kitchen CATARACT EXTRACTION, BILATERAL    . COLONOSCOPY N/A 01/26/2013   Procedure: COLONOSCOPY;  Surgeon: Milus Banister, MD;  Location: [place];  Service: Endoscopy;  Laterality: N/A;  . CORONARY ANGIOPLASTY WITH STENT PLACEMENT  09/06/1998   LAD stent per Dr Elonda Husky in [place]. 2 stents.   . CYSTOSCOPY W/ URETERAL STENT PLACEMENT Left 12/21/2016   Procedure: CYSTOSCOPY WITH RETROGRADE PYELOGRAM/URETERAL STENT PLACEMENT;  Surgeon: Alexis Frock, MD;  Location: [facility];  Service: Urology;  Laterality: Left;  . ESOPHAGOGASTRODUODENOSCOPY (EGD) WITH ESOPHAGEAL DILATION     "q time I have an EGD; usually q 3-4 years" (04/14/2015)  . EXTRACORPOREAL SHOCK WAVE LITHOTRIPSY Left 12/30/2016   Procedure: LEFT EXTRACORPOREAL SHOCK WAVE LITHOTRIPSY (ESWL);  Surgeon: Carolan Clines, MD;  Location: [facility];  Service: Urology;  Laterality: Left;  . LITHOTRIPSY  X 2  . SQUAMOUS CELL CARCINOMA EXCISION     right forearm  . TONSILLECTOMY  1971  . UPPER GASTROINTESTINAL ENDOSCOPY    . URETERAL STENT PLACEMENT     2019    MEDICATIONS: . acetaminophen (TYLENOL) 500 MG tablet  . ALPRAZolam (XANAX) 0.25 MG tablet  . aspirin 81 MG EC tablet  . Glucosamine HCl (GLUCOSAMINE PO)  . Menthol, Topical Analgesic, (BLUE-EMU MAXIMUM STRENGTH EX)  . omeprazole (PRILOSEC) 20 MG capsule  . oxyCODONE-acetaminophen (PERCOCET/ROXICET) 5-325 MG tablet  . rosuvastatin (CRESTOR) 10 MG tablet  . rosuvastatin (CRESTOR) 20 MG tablet  . senna-docusate (SENOKOT-S) 8.6-50 MG tablet  . tadalafil (CIALIS) 20 MG tablet  . traMADol (ULTRAM) 50 MG tablet   No current facility-administered medications for this  encounter.       Wynonia Musty [facility] Short Stay Center/Anesthesiology Phone [phone] 03/13/2018 3:24 PM

## 2018-03-16 DIAGNOSIS — R972 Elevated prostate specific antigen [PSA]: Secondary | ICD-10-CM | POA: Diagnosis not present

## 2018-03-16 LAB — PSA: PSA: 2.93

## 2018-03-20 MED ORDER — TRANEXAMIC ACID 1000 MG/10ML IV SOLN
1000.0000 mg | INTRAVENOUS | Status: AC
  Administered 2018-03-21: 1000 mg via INTRAVENOUS
  Filled 2018-03-20: qty 1100

## 2018-03-20 MED ORDER — BUPIVACAINE LIPOSOME 1.3 % IJ SUSP
20.0000 mL | INTRAMUSCULAR | Status: DC
  Filled 2018-03-20: qty 20

## 2018-03-21 ENCOUNTER — Observation Stay (HOSPITAL_COMMUNITY)
Admission: RE | Admit: 2018-03-21 | Discharge: 2018-03-22 | Disposition: A | Discharge status: Home / Self Care | Payer: Medicare Other | Source: Ambulatory Visit | Attending: Orthopedic Surgery | Admitting: Orthopedic Surgery

## 2018-03-21 ENCOUNTER — Encounter (HOSPITAL_COMMUNITY): Payer: Self-pay | Admitting: Anesthesiology

## 2018-03-21 ENCOUNTER — Encounter (HOSPITAL_COMMUNITY)
Admission: RE | Disposition: A | Discharge status: Home / Self Care | Payer: Self-pay | Source: Ambulatory Visit | Attending: Orthopedic Surgery

## 2018-03-21 ENCOUNTER — Inpatient Hospital Stay (HOSPITAL_COMMUNITY): Payer: Medicare Other | Admitting: Anesthesiology

## 2018-03-21 ENCOUNTER — Inpatient Hospital Stay (HOSPITAL_COMMUNITY): Payer: Medicare Other | Admitting: Physician Assistant

## 2018-03-21 ENCOUNTER — Observation Stay (HOSPITAL_COMMUNITY): Payer: Medicare Other

## 2018-03-21 DIAGNOSIS — F411 Generalized anxiety disorder: Secondary | ICD-10-CM

## 2018-03-21 DIAGNOSIS — M1712 Unilateral primary osteoarthritis, left knee: Secondary | ICD-10-CM | POA: Diagnosis present

## 2018-03-21 DIAGNOSIS — M1990 Unspecified osteoarthritis, unspecified site: Secondary | ICD-10-CM | POA: Insufficient documentation

## 2018-03-21 DIAGNOSIS — E1165 Type 2 diabetes mellitus with hyperglycemia: Secondary | ICD-10-CM | POA: Insufficient documentation

## 2018-03-21 DIAGNOSIS — R001 Bradycardia, unspecified: Secondary | ICD-10-CM | POA: Diagnosis not present

## 2018-03-21 DIAGNOSIS — Z87442 Personal history of urinary calculi: Secondary | ICD-10-CM | POA: Insufficient documentation

## 2018-03-21 DIAGNOSIS — K219 Gastro-esophageal reflux disease without esophagitis: Secondary | ICD-10-CM | POA: Diagnosis not present

## 2018-03-21 DIAGNOSIS — I10 Essential (primary) hypertension: Secondary | ICD-10-CM | POA: Insufficient documentation

## 2018-03-21 DIAGNOSIS — Z96659 Presence of unspecified artificial knee joint: Secondary | ICD-10-CM

## 2018-03-21 DIAGNOSIS — F329 Major depressive disorder, single episode, unspecified: Secondary | ICD-10-CM | POA: Insufficient documentation

## 2018-03-21 DIAGNOSIS — E785 Hyperlipidemia, unspecified: Secondary | ICD-10-CM | POA: Diagnosis not present

## 2018-03-21 DIAGNOSIS — Z8673 Personal history of transient ischemic attack (TIA), and cerebral infarction without residual deficits: Secondary | ICD-10-CM | POA: Insufficient documentation

## 2018-03-21 DIAGNOSIS — Z79899 Other long term (current) drug therapy: Secondary | ICD-10-CM | POA: Diagnosis not present

## 2018-03-21 DIAGNOSIS — Z888 Allergy status to other drugs, medicaments and biological substances status: Secondary | ICD-10-CM | POA: Diagnosis not present

## 2018-03-21 DIAGNOSIS — K2270 Barrett's esophagus without dysplasia: Secondary | ICD-10-CM | POA: Diagnosis not present

## 2018-03-21 DIAGNOSIS — Z96652 Presence of left artificial knee joint: Secondary | ICD-10-CM | POA: Diagnosis not present

## 2018-03-21 DIAGNOSIS — R739 Hyperglycemia, unspecified: Secondary | ICD-10-CM | POA: Diagnosis present

## 2018-03-21 DIAGNOSIS — I2510 Atherosclerotic heart disease of native coronary artery without angina pectoris: Secondary | ICD-10-CM | POA: Insufficient documentation

## 2018-03-21 DIAGNOSIS — I252 Old myocardial infarction: Secondary | ICD-10-CM | POA: Insufficient documentation

## 2018-03-21 DIAGNOSIS — Z85828 Personal history of other malignant neoplasm of skin: Secondary | ICD-10-CM | POA: Insufficient documentation

## 2018-03-21 DIAGNOSIS — G8918 Other acute postprocedural pain: Secondary | ICD-10-CM | POA: Diagnosis not present

## 2018-03-21 DIAGNOSIS — F419 Anxiety disorder, unspecified: Secondary | ICD-10-CM | POA: Insufficient documentation

## 2018-03-21 DIAGNOSIS — Z7982 Long term (current) use of aspirin: Secondary | ICD-10-CM | POA: Insufficient documentation

## 2018-03-21 DIAGNOSIS — Z955 Presence of coronary angioplasty implant and graft: Secondary | ICD-10-CM | POA: Insufficient documentation

## 2018-03-21 DIAGNOSIS — M1710 Unilateral primary osteoarthritis, unspecified knee: Secondary | ICD-10-CM | POA: Diagnosis present

## 2018-03-21 DIAGNOSIS — N529 Male erectile dysfunction, unspecified: Secondary | ICD-10-CM | POA: Insufficient documentation

## 2018-03-21 DIAGNOSIS — R972 Elevated prostate specific antigen [PSA]: Secondary | ICD-10-CM | POA: Insufficient documentation

## 2018-03-21 DIAGNOSIS — Z87891 Personal history of nicotine dependence: Secondary | ICD-10-CM | POA: Diagnosis not present

## 2018-03-21 DIAGNOSIS — M5417 Radiculopathy, lumbosacral region: Secondary | ICD-10-CM | POA: Insufficient documentation

## 2018-03-21 DIAGNOSIS — Z471 Aftercare following joint replacement surgery: Secondary | ICD-10-CM | POA: Diagnosis not present

## 2018-03-21 HISTORY — PX: TOTAL KNEE ARTHROPLASTY: SHX125

## 2018-03-21 SURGERY — ARTHROPLASTY, KNEE, TOTAL
Anesthesia: Spinal | Site: Knee | Laterality: Left

## 2018-03-21 MED ORDER — PROPOFOL 500 MG/50ML IV EMUL
INTRAVENOUS | Status: DC | PRN
  Administered 2018-03-21: 35 ug/kg/min via INTRAVENOUS

## 2018-03-21 MED ORDER — BUPIVACAINE LIPOSOME 1.3 % IJ SUSP
INTRAMUSCULAR | Status: DC | PRN
  Administered 2018-03-21: 20 mL

## 2018-03-21 MED ORDER — LACTATED RINGERS IV SOLN
INTRAVENOUS | Status: DC
  Administered 2018-03-21 (×2): via INTRAVENOUS

## 2018-03-21 MED ORDER — CHLORHEXIDINE GLUCONATE 4 % EX LIQD: 60.0000 mL | Freq: Once | CUTANEOUS | Status: DC

## 2018-03-21 MED ORDER — FENTANYL CITRATE (PF) 250 MCG/5ML IJ SOLN
INTRAMUSCULAR | Status: DC | PRN
  Administered 2018-03-21: 100 ug via INTRAVENOUS

## 2018-03-21 MED ORDER — ACETAMINOPHEN 500 MG PO TABS: 1000.0000 mg | ORAL_TABLET | Freq: Three times a day (TID) | ORAL | 0 refills | Status: AC

## 2018-03-21 MED ORDER — MAGNESIUM CITRATE PO SOLN: 1.0000 | Freq: Once | ORAL | Status: DC | PRN

## 2018-03-21 MED ORDER — ONDANSETRON HCL 4 MG/2ML IJ SOLN
INTRAMUSCULAR | Status: DC | PRN
  Administered 2018-03-21: 4 mg via INTRAVENOUS

## 2018-03-21 MED ORDER — PANTOPRAZOLE SODIUM 40 MG PO TBEC
40.0000 mg | DELAYED_RELEASE_TABLET | Freq: Every day | ORAL | Status: DC
  Administered 2018-03-22: 40 mg via ORAL
  Filled 2018-03-21: qty 1

## 2018-03-21 MED ORDER — OXYCODONE HCL 5 MG PO TABS
5.0000 mg | ORAL_TABLET | ORAL | Status: DC | PRN
  Administered 2018-03-21 (×2): 5 mg via ORAL
  Administered 2018-03-22 (×3): 10 mg via ORAL
  Filled 2018-03-21: qty 2
  Filled 2018-03-21 (×2): qty 1
  Filled 2018-03-21 (×2): qty 2

## 2018-03-21 MED ORDER — LACTATED RINGERS IV SOLN: INTRAVENOUS | Status: DC

## 2018-03-21 MED ORDER — SUGAMMADEX SODIUM 200 MG/2ML IV SOLN
INTRAVENOUS | Status: AC
  Filled 2018-03-21: qty 2

## 2018-03-21 MED ORDER — ACETAMINOPHEN 500 MG PO TABS
1000.0000 mg | ORAL_TABLET | Freq: Once | ORAL | Status: AC
  Administered 2018-03-21: 1000 mg via ORAL

## 2018-03-21 MED ORDER — LIDOCAINE 2% (20 MG/ML) 5 ML SYRINGE
INTRAMUSCULAR | Status: AC
  Filled 2018-03-21: qty 5

## 2018-03-21 MED ORDER — SODIUM CHLORIDE 0.9 % IR SOLN
Status: DC | PRN
  Administered 2018-03-21: 3000 mL

## 2018-03-21 MED ORDER — ASPIRIN EC 81 MG PO TBEC: 81.0000 mg | DELAYED_RELEASE_TABLET | Freq: Two times a day (BID) | ORAL | 0 refills | Status: DC

## 2018-03-21 MED ORDER — METHOCARBAMOL 500 MG PO TABS
500.0000 mg | ORAL_TABLET | Freq: Four times a day (QID) | ORAL | Status: DC | PRN
  Administered 2018-03-22 (×2): 500 mg via ORAL
  Filled 2018-03-21 (×2): qty 1

## 2018-03-21 MED ORDER — 0.9 % SODIUM CHLORIDE (POUR BTL) OPTIME
TOPICAL | Status: DC | PRN
  Administered 2018-03-21: 1000 mL

## 2018-03-21 MED ORDER — ACETAMINOPHEN 325 MG PO TABS: 325.0000 mg | ORAL_TABLET | Freq: Four times a day (QID) | ORAL | Status: DC | PRN

## 2018-03-21 MED ORDER — SORBITOL 70 % SOLN
30.0000 mL | Freq: Every day | Status: DC | PRN
Start: 2018-03-21 — End: 2018-03-22

## 2018-03-21 MED ORDER — PHENOL 1.4 % MT LIQD: 1.0000 | OROMUCOSAL | Status: DC | PRN

## 2018-03-21 MED ORDER — ACETAMINOPHEN 500 MG PO TABS
1000.0000 mg | ORAL_TABLET | Freq: Three times a day (TID) | ORAL | Status: DC
  Administered 2018-03-21 – 2018-03-22 (×4): 1000 mg via ORAL
  Filled 2018-03-21 (×4): qty 2

## 2018-03-21 MED ORDER — DOCUSATE SODIUM 100 MG PO CAPS
100.0000 mg | ORAL_CAPSULE | Freq: Two times a day (BID) | ORAL | Status: DC
  Administered 2018-03-21 – 2018-03-22 (×2): 100 mg via ORAL
  Filled 2018-03-21 (×2): qty 1

## 2018-03-21 MED ORDER — SODIUM CHLORIDE 0.9% FLUSH
INTRAVENOUS | Status: DC | PRN
  Administered 2018-03-21: 30 mL

## 2018-03-21 MED ORDER — METOCLOPRAMIDE HCL 5 MG PO TABS: 5.0000 mg | ORAL_TABLET | Freq: Three times a day (TID) | ORAL | Status: DC | PRN

## 2018-03-21 MED ORDER — FENTANYL CITRATE (PF) 250 MCG/5ML IJ SOLN
INTRAMUSCULAR | Status: AC
  Filled 2018-03-21: qty 5

## 2018-03-21 MED ORDER — FENTANYL CITRATE (PF) 100 MCG/2ML IJ SOLN
50.0000 ug | Freq: Once | INTRAMUSCULAR | Status: AC
  Administered 2018-03-21: 50 ug via INTRAVENOUS

## 2018-03-21 MED ORDER — MENTHOL 3 MG MT LOZG: 1.0000 | LOZENGE | OROMUCOSAL | Status: DC | PRN

## 2018-03-21 MED ORDER — GABAPENTIN 300 MG PO CAPS
300.0000 mg | ORAL_CAPSULE | Freq: Once | ORAL | Status: AC
  Administered 2018-03-21: 300 mg via ORAL

## 2018-03-21 MED ORDER — PROPOFOL 10 MG/ML IV BOLUS
INTRAVENOUS | Status: AC
  Filled 2018-03-21: qty 20

## 2018-03-21 MED ORDER — ROSUVASTATIN CALCIUM 10 MG PO TABS
20.0000 mg | ORAL_TABLET | Freq: Every day | ORAL | Status: DC
  Administered 2018-03-21: 20 mg via ORAL
  Filled 2018-03-21 (×2): qty 2

## 2018-03-21 MED ORDER — BUPIVACAINE IN DEXTROSE 0.75-8.25 % IT SOLN
INTRATHECAL | Status: DC | PRN
  Administered 2018-03-21: 2 mL via INTRATHECAL

## 2018-03-21 MED ORDER — GABAPENTIN 300 MG PO CAPS
ORAL_CAPSULE | ORAL | Status: AC
  Administered 2018-03-21: 300 mg via ORAL
  Filled 2018-03-21: qty 1

## 2018-03-21 MED ORDER — ONDANSETRON HCL 4 MG/2ML IJ SOLN
4.0000 mg | Freq: Four times a day (QID) | INTRAMUSCULAR | Status: DC | PRN
Start: 2018-03-21 — End: 2018-03-22

## 2018-03-21 MED ORDER — HYDROMORPHONE HCL 1 MG/ML IJ SOLN: 0.5000 mg | INTRAMUSCULAR | Status: DC | PRN

## 2018-03-21 MED ORDER — CEFAZOLIN SODIUM-DEXTROSE 2-4 GM/100ML-% IV SOLN
2.0000 g | INTRAVENOUS | Status: AC
  Administered 2018-03-21: 2 g via INTRAVENOUS

## 2018-03-21 MED ORDER — MEPERIDINE HCL 50 MG/ML IJ SOLN: 6.2500 mg | INTRAMUSCULAR | Status: DC | PRN

## 2018-03-21 MED ORDER — FENTANYL CITRATE (PF) 100 MCG/2ML IJ SOLN
INTRAMUSCULAR | Status: AC
  Administered 2018-03-21: 50 ug via INTRAVENOUS
  Filled 2018-03-21: qty 2

## 2018-03-21 MED ORDER — MIDAZOLAM HCL 2 MG/2ML IJ SOLN
INTRAMUSCULAR | Status: AC
  Administered 2018-03-21: 1 mg via INTRAVENOUS
  Filled 2018-03-21: qty 2

## 2018-03-21 MED ORDER — ROCURONIUM BROMIDE 10 MG/ML (PF) SYRINGE
PREFILLED_SYRINGE | INTRAVENOUS | Status: AC
  Filled 2018-03-21: qty 10

## 2018-03-21 MED ORDER — CEFAZOLIN SODIUM-DEXTROSE 2-4 GM/100ML-% IV SOLN
2.0000 g | Freq: Four times a day (QID) | INTRAVENOUS | Status: AC
  Administered 2018-03-21 (×2): 2 g via INTRAVENOUS
  Filled 2018-03-21 (×3): qty 100

## 2018-03-21 MED ORDER — EPHEDRINE SULFATE-NACL 50-0.9 MG/10ML-% IV SOSY
PREFILLED_SYRINGE | INTRAVENOUS | Status: DC | PRN
  Administered 2018-03-21: 5 mg via INTRAVENOUS

## 2018-03-21 MED ORDER — DEXAMETHASONE SODIUM PHOSPHATE 10 MG/ML IJ SOLN
10.0000 mg | Freq: Once | INTRAMUSCULAR | Status: AC
  Administered 2018-03-22: 10 mg via INTRAVENOUS
  Filled 2018-03-21: qty 1

## 2018-03-21 MED ORDER — ACETAMINOPHEN 500 MG PO TABS
ORAL_TABLET | ORAL | Status: AC
  Administered 2018-03-21: 1000 mg via ORAL
  Filled 2018-03-21: qty 2

## 2018-03-21 MED ORDER — ASPIRIN 81 MG PO CHEW
81.0000 mg | CHEWABLE_TABLET | Freq: Two times a day (BID) | ORAL | Status: DC
  Administered 2018-03-21 – 2018-03-22 (×2): 81 mg via ORAL
  Filled 2018-03-21 (×2): qty 1

## 2018-03-21 MED ORDER — BUPIVACAINE LIPOSOME 1.3 % IJ SUSP
20.0000 mL | Freq: Once | INTRAMUSCULAR | Status: DC
  Filled 2018-03-21: qty 20

## 2018-03-21 MED ORDER — ROPIVACAINE HCL 7.5 MG/ML IJ SOLN
INTRAMUSCULAR | Status: DC | PRN
  Administered 2018-03-21: 20 mL via PERINEURAL

## 2018-03-21 MED ORDER — KETOROLAC TROMETHAMINE 15 MG/ML IJ SOLN
7.5000 mg | Freq: Four times a day (QID) | INTRAMUSCULAR | Status: AC
  Administered 2018-03-21 – 2018-03-22 (×4): 7.5 mg via INTRAVENOUS
  Filled 2018-03-21 (×4): qty 1

## 2018-03-21 MED ORDER — METHOCARBAMOL 1000 MG/10ML IJ SOLN
500.0000 mg | Freq: Four times a day (QID) | INTRAVENOUS | Status: DC | PRN
  Filled 2018-03-21: qty 5

## 2018-03-21 MED ORDER — PROMETHAZINE HCL 25 MG/ML IJ SOLN: 6.2500 mg | INTRAMUSCULAR | Status: DC | PRN

## 2018-03-21 MED ORDER — METOCLOPRAMIDE HCL 5 MG/ML IJ SOLN: 5.0000 mg | Freq: Three times a day (TID) | INTRAMUSCULAR | Status: DC | PRN

## 2018-03-21 MED ORDER — BACLOFEN 10 MG PO TABS: 10.0000 mg | ORAL_TABLET | Freq: Three times a day (TID) | ORAL | 0 refills | Status: DC | PRN

## 2018-03-21 MED ORDER — MIDAZOLAM HCL 2 MG/2ML IJ SOLN
1.0000 mg | Freq: Once | INTRAMUSCULAR | Status: AC
  Administered 2018-03-21: 1 mg via INTRAVENOUS

## 2018-03-21 MED ORDER — HYDROMORPHONE HCL 1 MG/ML IJ SOLN
0.2500 mg | INTRAMUSCULAR | Status: DC | PRN
  Administered 2018-03-21 (×2): 0.5 mg via INTRAVENOUS

## 2018-03-21 MED ORDER — ONDANSETRON HCL 4 MG/2ML IJ SOLN
INTRAMUSCULAR | Status: AC
  Filled 2018-03-21: qty 2

## 2018-03-21 MED ORDER — ONDANSETRON HCL 4 MG PO TABS: 4.0000 mg | ORAL_TABLET | Freq: Four times a day (QID) | ORAL | Status: DC | PRN

## 2018-03-21 MED ORDER — POVIDONE-IODINE 10 % EX SWAB: 2.0000 "application " | Freq: Once | CUTANEOUS | Status: DC

## 2018-03-21 MED ORDER — POLYETHYLENE GLYCOL 3350 17 G PO PACK: 17.0000 g | PACK | Freq: Every day | ORAL | Status: DC | PRN

## 2018-03-21 MED ORDER — DIPHENHYDRAMINE HCL 12.5 MG/5ML PO ELIX: 12.5000 mg | ORAL_SOLUTION | ORAL | Status: DC | PRN

## 2018-03-21 MED ORDER — CEFAZOLIN SODIUM-DEXTROSE 2-4 GM/100ML-% IV SOLN
INTRAVENOUS | Status: AC
Start: 2018-03-21 — End: 2018-03-21
  Filled 2018-03-21: qty 100

## 2018-03-21 MED ORDER — ONDANSETRON HCL 4 MG PO TABS: 4.0000 mg | ORAL_TABLET | Freq: Three times a day (TID) | ORAL | 0 refills | Status: DC | PRN

## 2018-03-21 MED ORDER — GABAPENTIN 100 MG PO CAPS: 100.0000 mg | ORAL_CAPSULE | Freq: Two times a day (BID) | ORAL | 0 refills | Status: DC

## 2018-03-21 MED ORDER — LACTATED RINGERS IV SOLN
INTRAVENOUS | Status: DC
  Administered 2018-03-21 – 2018-03-22 (×2): via INTRAVENOUS

## 2018-03-21 MED ORDER — OXYCODONE HCL 5 MG PO TABS: 5.0000 mg | ORAL_TABLET | ORAL | 0 refills | Status: AC | PRN

## 2018-03-21 MED ORDER — HYDROMORPHONE HCL 1 MG/ML IJ SOLN
INTRAMUSCULAR | Status: AC
  Administered 2018-03-21: 0.5 mg via INTRAVENOUS
  Filled 2018-03-21: qty 1

## 2018-03-21 SURGICAL SUPPLY — 63 items
BANDAGE ESMARK 6X9 LF (GAUZE/BANDAGES/DRESSINGS) ×1 IMPLANT
BEARIN INSERT TIBIAL SZ6 9 (Orthopedic Implant) ×2 IMPLANT
BEARIN INSERT TIBIAL SZ6 9MM (Orthopedic Implant) ×1 IMPLANT
BEARING INSERT TIBIAL SZ6 9 (Orthopedic Implant) IMPLANT
BLADE SAG 18X100X1.27 (BLADE) ×6 IMPLANT
BNDG CMPR 9X6 STRL LF SNTH (GAUZE/BANDAGES/DRESSINGS) ×1
BNDG CMPR MED 10X6 ELC LF (GAUZE/BANDAGES/DRESSINGS) ×1
BNDG CMPR MED 15X6 ELC VLCR LF (GAUZE/BANDAGES/DRESSINGS) ×1
BNDG ELASTIC 6X10 VLCR STRL LF (GAUZE/BANDAGES/DRESSINGS) ×3 IMPLANT
BNDG ELASTIC 6X15 VLCR STRL LF (GAUZE/BANDAGES/DRESSINGS) ×3 IMPLANT
BNDG ESMARK 6X9 LF (GAUZE/BANDAGES/DRESSINGS) ×3
BOWL SMART MIX CTS (DISPOSABLE) ×3 IMPLANT
BSPLAT TIB 6 KN TRITANIUM (Knees) ×1 IMPLANT
CLOSURE STERI-STRIP 1/2X4 (GAUZE/BANDAGES/DRESSINGS) ×2
CLSR STERI-STRIP ANTIMIC 1/2X4 (GAUZE/BANDAGES/DRESSINGS) ×4 IMPLANT
COVER SURGICAL LIGHT HANDLE (MISCELLANEOUS) ×3 IMPLANT
CUFF TOURNIQUET SINGLE 34IN LL (TOURNIQUET CUFF) ×3 IMPLANT
DRAPE EXTREMITY T 121X128X90 (DRAPE) ×3 IMPLANT
DRAPE HALF SHEET 40X57 (DRAPES) ×3 IMPLANT
DRAPE IMP U-DRAPE 54X76 (DRAPES) ×3 IMPLANT
DRAPE U-SHAPE 47X51 STRL (DRAPES) ×3 IMPLANT
DRSG MEPILEX BORDER 4X12 (GAUZE/BANDAGES/DRESSINGS) ×3 IMPLANT
DRSG MEPILEX BORDER 4X8 (GAUZE/BANDAGES/DRESSINGS) ×3 IMPLANT
DURAPREP 26ML APPLICATOR (WOUND CARE) ×6 IMPLANT
ELECT CAUTERY BLADE 6.4 (BLADE) ×3 IMPLANT
ELECT REM PT RETURN 9FT ADLT (ELECTROSURGICAL) ×3
ELECTRODE REM PT RTRN 9FT ADLT (ELECTROSURGICAL) ×1 IMPLANT
FACESHIELD WRAPAROUND (MASK) ×6 IMPLANT
FACESHIELD WRAPAROUND OR TEAM (MASK) ×2 IMPLANT
FEMORAL POSTERIOR SZ6 LFT (Femur) IMPLANT
GLOVE BIO SURGEON STRL SZ7.5 (GLOVE) ×6 IMPLANT
GLOVE BIOGEL PI IND STRL 8 (GLOVE) ×2 IMPLANT
GLOVE BIOGEL PI INDICATOR 8 (GLOVE) ×4
GOWN STRL REUS W/ TWL LRG LVL3 (GOWN DISPOSABLE) ×2 IMPLANT
GOWN STRL REUS W/ TWL XL LVL3 (GOWN DISPOSABLE) ×1 IMPLANT
GOWN STRL REUS W/TWL LRG LVL3 (GOWN DISPOSABLE) ×6
GOWN STRL REUS W/TWL XL LVL3 (GOWN DISPOSABLE) ×3
HANDPIECE INTERPULSE COAX TIP (DISPOSABLE)
IMMOBILIZER KNEE 22 UNIV (SOFTGOODS) ×3 IMPLANT
KIT BASIN OR (CUSTOM PROCEDURE TRAY) ×3 IMPLANT
KIT TURNOVER KIT B (KITS) ×3 IMPLANT
KNEE PATELLA ASYMMETRIC 10X32 (Knees) ×2 IMPLANT
KNEE TIBIAL COMPONENT SZ6 (Knees) ×2 IMPLANT
MANIFOLD NEPTUNE II (INSTRUMENTS) ×3 IMPLANT
NEEDLE 22X1 1/2 (OR ONLY) (NEEDLE) ×3 IMPLANT
NS IRRIG 1000ML POUR BTL (IV SOLUTION) ×3 IMPLANT
PACK TOTAL JOINT (CUSTOM PROCEDURE TRAY) ×3 IMPLANT
PAD ARMBOARD 7.5X6 YLW CONV (MISCELLANEOUS) ×3 IMPLANT
POSTERIOR FEMORAL SZ6 LFT (Femur) ×3 IMPLANT
SET HNDPC FAN SPRY TIP SCT (DISPOSABLE) IMPLANT
SUCTION FRAZIER HANDLE 10FR (MISCELLANEOUS) ×2
SUCTION TUBE FRAZIER 10FR DISP (MISCELLANEOUS) ×1 IMPLANT
SUT MNCRL AB 4-0 PS2 18 (SUTURE) ×3 IMPLANT
SUT MON AB 2-0 CT1 27 (SUTURE) ×3 IMPLANT
SUT VIC AB 0 CT1 27 (SUTURE) ×3
SUT VIC AB 0 CT1 27XBRD ANBCTR (SUTURE) ×1 IMPLANT
SUT VIC AB 1 CT1 27 (SUTURE) ×6
SUT VIC AB 1 CT1 27XBRD ANBCTR (SUTURE) ×2 IMPLANT
SYR 50ML LL SCALE MARK (SYRINGE) ×3 IMPLANT
TOWEL OR 17X24 6PK STRL BLUE (TOWEL DISPOSABLE) ×3 IMPLANT
TOWEL OR 17X26 10 PK STRL BLUE (TOWEL DISPOSABLE) ×3 IMPLANT
TRAY CATH 16FR W/PLASTIC CATH (SET/KITS/TRAYS/PACK) IMPLANT
TRAY FOLEY BAG SILVER LF 14FR (CATHETERS) IMPLANT

## 2018-03-21 NOTE — Anesthesia Procedure Notes (Signed)
Anesthesia Regional Block: Adductor canal block   Pre-Anesthetic Checklist: ,, timeout performed, Correct Patient, Correct Site, Correct Laterality, Correct Procedure, Correct Position, site marked, Risks and benefits discussed,  Surgical consent,  Pre-op evaluation,  At surgeon's request and post-op pain management  Laterality: Left  Prep: chloraprep       Needles:  Injection technique: Single-shot  Needle Type: Echogenic Stimulator Needle     Needle Length: 9cm  Needle Gauge: 21   Needle insertion depth: 6 cm   Additional Needles:   Procedures:,,,, ultrasound used (permanent image in chart),,,,  Narrative:  Start time: 03/21/2018 8:53 AM End time: 03/21/2018 8:58 AM Injection made incrementally with aspirations every 5 mL.  Performed by: Personally  Anesthesiologist: Josephine Igo, MD  Additional Notes: Timeout performed. Patient sedated. Relevant anatomy ID'd using [place]. Incremental 2-65ml injection of LA with frequent aspiration. Patient tolerated procedure well.        Left Adductor Canal Block

## 2018-03-21 NOTE — Anesthesia Procedure Notes (Signed)
Spinal  Patient location during procedure: OR Start time: 03/21/2018 10:15 AM Staffing Anesthesiologist: Josephine Igo, MD Performed: anesthesiologist  Preanesthetic Checklist Completed: patient identified, site marked, surgical consent, pre-op evaluation, timeout performed, IV checked, risks and benefits discussed and monitors and equipment checked Spinal Block Patient position: sitting Prep: site prepped and draped and DuraPrep Patient monitoring: heart rate, cardiac monitor, continuous pulse ox and blood pressure Approach: midline Location: L3-4 Injection technique: single-shot Needle Needle type: Pencan  Needle gauge: 24 G Needle length: 9 cm Needle insertion depth: 7 cm Assessment Sensory level: T4 Additional Notes Patient tolerated procedure well. Adequate sensory level.

## 2018-03-21 NOTE — Interval H&P Note (Signed)
History and Physical Interval Note:  03/21/2018 7:40 AM  Jose [name]  has presented today for surgery, with the diagnosis of OA LEFT KNEE  The various methods of treatment have been discussed with the patient and family. After consideration of risks, benefits and other options for treatment, the patient has consented to  Procedure(s): LEFT TOTAL KNEE ARTHROPLASTY (Left) as a surgical intervention .  The patient's history has been reviewed, patient examined, no change in status, stable for surgery.  I have reviewed the patient's chart and labs.  Questions were answered to the patient's satisfaction.     Jose [name] D

## 2018-03-21 NOTE — Op Note (Signed)
DATE OF SURGERY:  03/21/2018 TIME: 11:12 AM  PATIENT NAME:  Jose [name]   AGE: 77 y.o.    PRE-OPERATIVE DIAGNOSIS:  OA LEFT KNEE  POST-OPERATIVE DIAGNOSIS:  Same  PROCEDURE:  Procedure(s): LEFT TOTAL KNEE ARTHROPLASTY   SURGEON:  Jawuan Robb D, MD   ASSISTANT:  Roxan Hockey, PA-C, he was present and scrubbed throughout the case, critical for completion in a timely fashion, and for retraction, instrumentation, and closure.    OPERATIVE IMPLANTS: [employer]ker Triathlon Posterior Stabilized. Press fit knee  Femur size 6, Tibia size 6, Patella size 32 3-peg oval button, with a 9 mm polyethylene insert.   PREOPERATIVE INDICATIONS:  MAKSYM PFIFFNER is a 77 y.o. year old male with end stage bone on bone degenerative arthritis of the knee who failed conservative treatment, including injections, antiinflammatories, activity modification, and assistive devices, and had significant impairment of their activities of daily living, and elected for Total Knee Arthroplasty.   The risks, benefits, and alternatives were discussed at length including but not limited to the risks of infection, bleeding, nerve injury, stiffness, blood clots, the need for revision surgery, cardiopulmonary complications, among others, and they were willing to proceed.   OPERATIVE DESCRIPTION:  The patient was brought to the operative room and placed in a supine position.  General anesthesia was administered.  IV antibiotics were given.  The lower extremity was prepped and draped in the usual sterile fashion.  Time out was performed.  The leg was elevated and exsanguinated and the tourniquet was inflated.  Anterior approach was performed.  The patella was everted and osteophytes were removed.  The anterior horn of the medial and lateral meniscus was removed.   The distal femur was opened with the drill and the intramedullary distal femoral cutting jig was utilized, set at 5 degrees resecting 10 mm off the  distal femur.  Care was taken to protect the collateral ligaments.  The distal femoral sizing jig was applied, taking care to avoid notching.  Then the 4-in-1 cutting jig was applied and the anterior and posterior femur was cut, along with the chamfer cuts.  All posterior osteophytes were removed.  The flexion gap was then measured and was symmetric with the extension gap.  Then the extramedullary tibial cutting jig was utilized making the appropriate cut using the anterior tibial crest as a reference building in appropriate posterior slope.  Care was taken during the cut to protect the medial and collateral ligaments.  The proximal tibia was removed along with the posterior horns of the menisci.  The PCL was sacrificed.    The extensor gap was measured and was approximately 38mm.    I completed the distal femoral preparation using the appropriate jig to prepare the box.  The patella was then measured, and cut with the saw.    The proximal tibia sized and prepared accordingly with the reamer and the punch, and then all components were trialed with the above sized poly insert.  The knee was found to have excellent balance and full motion.    The above named components were then impacted into place and Poly tibial piece and patella were inserted.  I was very happy with his stability and ROM  I performed a periarticular injection with marcaine and toradol  The knee was easily taken through a range of motion and the patella tracked well and the knee irrigated copiously and the parapatellar and subcutaneous tissue closed with vicryl, and monocryl with steri strips for the skin.  The incision was dressed with sterile gauze and the tourniquet released and the patient was awakened and returned to the PACU in stable and satisfactory condition.  There were no complications.  Total tourniquet time was roughly 60 minutes.   POSTOPERATIVE PLAN: post op Abx, DVT px: SCD's, TED's, Early ambulation and  chemical px

## 2018-03-21 NOTE — Discharge Instructions (Signed)
You may bear weight as tolerated. °Keep your dressing on and dry until follow up. °Take medicine to prevent blood clots as directed. °Take pain medicine as needed with the goal of transitioning to over the counter medicines.  °If needed, you may increase pain medication for the first few days post up to 2 tablets every 4 hours. ° °INSTRUCTIONS AFTER JOINT REPLACEMENT  ° °o Remove items at home which could result in a fall. This includes throw rugs or furniture in walking pathways °o ICE to the affected joint every three hours while awake for 30 minutes at a time, for at least the first 3-5 days, and then as needed for pain and swelling.  Continue to use ice for pain and swelling. You may notice swelling that will progress down to the foot and ankle.  This is normal after surgery.  Elevate your leg when you are not up walking on it.   °o Continue to use the breathing machine you got in the hospital (incentive spirometer) which will help keep your temperature down.  It is common for your temperature to cycle up and down following surgery, especially at night when you are not up moving around and exerting yourself.  The breathing machine keeps your lungs expanded and your temperature down. ° ° °DIET:  As you were doing prior to hospitalization, we recommend a well-balanced diet. ° °DRESSING / WOUND CARE / SHOWERING ° °You may shower 3 days after surgery, but keep the wounds dry during showering.  You may use an occlusive plastic wrap (Press'n Seal for example) with blue painter's tape at edges, NO SOAKING/SUBMERGING IN THE BATHTUB.  If the bandage gets wet, change with a clean dry gauze.  If the incision gets wet, pat the wound dry with a clean towel. ° °ACTIVITY ° °o Increase activity slowly as tolerated, but follow the weight bearing instructions below.   °o No driving for 6 weeks or until further direction given by your physician.  You cannot drive while taking narcotics.  °o No lifting or carrying greater than 10  lbs. until further directed by your surgeon. °o Avoid periods of inactivity such as sitting longer than an hour when not asleep. This helps prevent blood clots.  °o You may return to work once you are authorized by your doctor.  ° ° ° °WEIGHT BEARING  ° °Weight bearing as tolerated with assist device (walker, cane, etc) as directed, use it as long as suggested by your surgeon or therapist, typically at least 4-6 weeks. ° ° °EXERCISES ° °Results after joint replacement surgery are often greatly improved when you follow the exercise, range of motion and muscle strengthening exercises prescribed by your doctor. Safety measures are also important to protect the joint from further injury. Any time any of these exercises cause you to have increased pain or swelling, decrease what you are doing until you are comfortable again and then slowly increase them. If you have problems or questions, call your caregiver or physical therapist for advice.  ° °Rehabilitation is important following a joint replacement. After just a few days of immobilization, the muscles of the leg can become weakened and shrink (atrophy).  These exercises are designed to build up the tone and strength of the thigh and leg muscles and to improve motion. Often times heat used for twenty to thirty minutes before working out will loosen up your tissues and help with improving the range of motion but do not use heat for the first two   weeks following surgery (sometimes heat can increase post-operative swelling).  ° °These exercises can be done on a training (exercise) mat, on the floor, on a table or on a bed. Use whatever works the best and is most comfortable for you.    Use music or television while you are exercising so that the exercises are a pleasant break in your day. This will make your life better with the exercises acting as a break in your routine that you can look forward to.   Perform all exercises about fifteen times, three times per day or as  directed.  You should exercise both the operative leg and the other leg as well. ° °Exercises include: °  °• Quad Sets - Tighten up the muscle on the front of the thigh (Quad) and hold for 5-10 seconds.   °• Straight Leg Raises - With your knee straight (if you were given a brace, keep it on), lift the leg to 60 degrees, hold for 3 seconds, and slowly lower the leg.  Perform this exercise against resistance later as your leg gets stronger.  °• Leg Slides: Lying on your back, slowly slide your foot toward your buttocks, bending your knee up off the floor (only go as far as is comfortable). Then slowly slide your foot back down until your leg is flat on the floor again.  °• Angel Wings: Lying on your back spread your legs to the side as far apart as you can without causing discomfort.  °• Hamstring Strength:  Lying on your back, push your heel against the floor with your leg straight by tightening up the muscles of your buttocks.  Repeat, but this time bend your knee to a comfortable angle, and push your heel against the floor.  You may put a pillow under the heel to make it more comfortable if necessary.  ° °A rehabilitation program following joint replacement surgery can speed recovery and prevent re-injury in the future due to weakened muscles. Contact your doctor or a physical therapist for more information on knee rehabilitation.  ° ° °CONSTIPATION ° °Constipation is defined medically as fewer than three stools per week and severe constipation as less than one stool per week.  Even if you have a regular bowel pattern at home, your normal regimen is likely to be disrupted due to multiple reasons following surgery.  Combination of anesthesia, postoperative narcotics, change in appetite and fluid intake all can affect your bowels.  ° °YOU MUST use at least one of the following options; they are listed in order of increasing strength to get the job done.  They are all available over the counter, and you may need to  use some, POSSIBLY even all of these options:   ° °Drink plenty of fluids (prune juice may be helpful) and high fiber foods °Colace 100 mg by mouth twice a day  °Senokot for constipation as directed and as needed Dulcolax (bisacodyl), take with full glass of water  °Miralax (polyethylene glycol) once or twice a day as needed. ° °If you have tried all these things and are unable to have a bowel movement in the first 3-4 days after surgery call either your surgeon or your primary doctor.   ° °If you experience loose stools or diarrhea, hold the medications until you stool forms back up.  If your symptoms do not get better within 1 week or if they get worse, check with your doctor.  If you experience "the worst abdominal pain ever" or   develop nausea or vomiting, please contact the office immediately for further recommendations for treatment. ° ° °ITCHING:  If you experience itching with your medications, try taking only a single pain pill, or even half a pain pill at a time.  You can also use Benadryl over the counter for itching or also to help with sleep.  ° °TED HOSE STOCKINGS:  Use stockings on both legs until for at least 2 weeks or as directed by physician office. They may be removed at night for sleeping. ° °MEDICATIONS:  See your medication summary on the “After Visit Summary” that nursing will review with you.  You may have some home medications which will be placed on hold until you complete the course of blood thinner medication.  It is important for you to complete the blood thinner medication as prescribed. ° °PRECAUTIONS:  If you experience chest pain or shortness of breath - call 911 immediately for transfer to the hospital emergency department.  ° °If you develop a fever greater that 101 F, purulent drainage from wound, increased redness or drainage from wound, foul odor from the wound/dressing, or calf pain - CONTACT YOUR SURGEON.   °                                                °FOLLOW-UP  APPOINTMENTS:  If you do not already have a post-op appointment, please call the office for an appointment to be seen by your surgeon.  Guidelines for how soon to be seen are listed in your “After Visit Summary”, but are typically between 1-4 weeks after surgery. ° °OTHER INSTRUCTIONS:  ° ° ° °MAKE SURE YOU:  °• Understand these instructions.  °• Get help right away if you are not doing well or get worse.  ° ° °Thank you for letting us be a part of your medical care team.  It is a privilege we respect greatly.  We hope these instructions will help you stay on track for a fast and full recovery!  ° ° ° ° °

## 2018-03-21 NOTE — Transfer of Care (Signed)
Immediate Anesthesia Transfer of Care Note  Patient: Jose [name]  Procedure(s) Performed: LEFT TOTAL KNEE ARTHROPLASTY (Left Knee)  Patient Location: PACU  Anesthesia Type:Spinal and MAC combined with regional for post-op pain  Level of Consciousness: awake, alert , oriented and patient cooperative  Airway & Oxygen Therapy: Patient Spontanous Breathing and Patient connected to nasal cannula oxygen  Post-op Assessment: Report given to RN, Post -op Vital signs reviewed and stable and Patient moving all extremities  Post vital signs: Reviewed and stable  Last Vitals:  Vitals Value Taken Time  BP 142/90 03/21/2018 12:21 PM  Temp    Pulse 52 03/21/2018 12:21 PM  Resp 17 03/21/2018 12:21 PM  SpO2 100 % 03/21/2018 12:21 PM  Vitals shown include unvalidated device data.  Last Pain:  Vitals:   03/21/18 0809  PainSc: 4          Complications: No apparent anesthesia complications

## 2018-03-21 NOTE — Evaluation (Signed)
Physical Therapy Evaluation Patient Details Name: Jose [name] MRN: [id number] DOB: October 13, 1940 Today's Date: 03/21/2018   History of Present Illness  Jose [name] is a 77 y.o. M s/p L TKR. PMH includes CAD, HTN, HLD, Cardiac Stents, Diverticulitis, Anxiety, and Barrett's Esophagus.   Clinical Impression  Pt presents with problems above and deficits below. Pt was educated on precautions and performed HEP. Pt performed sit<>stand transfers with ModA and a RW. Pt performed stand/pivot transfers with ModA+2. Pt limited  due to increased weakness and numbness secondary to lingering spinal block. Will continue to follow acutely to support independence, mobility, and safety.     Follow Up Recommendations Follow surgeon's recommendation for DC plan and follow-up therapies    Equipment Recommendations  3in1 (PT);Rolling walker with 5" wheels    Recommendations for Other Services OT consult     Precautions / Restrictions Precautions Precautions: Knee;Fall Precaution Booklet Issued: Yes (comment) Precaution Comments: Pt educated on precautions and supine HEP Restrictions Weight Bearing Restrictions: Yes LLE Weight Bearing: Weight bearing as tolerated      Mobility  Bed Mobility Overal bed mobility: Needs Assistance Bed Mobility: Supine to Sit     Supine to sit: Mod assist;Min assist;Min guard     General bed mobility comments: Pt was able to shift position in bed with MinG, but required Min-ModA to sit up at EOB and move L LE.  Transfers Overall transfer level: Needs assistance Equipment used: Rolling walker (2 wheeled) Transfers: Sit to/from [facility] Sit to Stand: Mod assist Stand pivot transfers: Mod assist;+2 physical assistance       General transfer comment: Pt required ModA to sit->stand, and had difficulty with initial power up. Pt also wavered on feet once fully standing likely secondary due to decreased sensation. Pt required ModA+2 to  stand/pivot to chair, as pt continued to remain unsteady with weakness and decreased sensation in BLE. Required cues for sequencing during transfer.   Ambulation/Gait             General Gait Details: Unsafe to attempt  Stairs            Wheelchair Mobility    Modified Rankin (Stroke Patients Only)       Balance Overall balance assessment: Needs assistance Sitting-balance support: Feet supported;Bilateral upper extremity supported Sitting balance-Leahy Scale: Poor Sitting balance - Comments: Pt was able to sit at EOB with L LE propped out and arms propped.    Standing balance support: Bilateral upper extremity supported Standing balance-Leahy Scale: Poor Standing balance comment: Pt wobbled immediately upon standing, even with ModA+1, and continued to wavered. ModA+2 was added for safety. Pt's balance likely limited secondary to lingering spinal block. Pt expressed desire to walk, but PT suggested standing marches with ModA+2 for balance instead. Pt was able to perform marches with ModA+2, with some unsteadiness.                              Pertinent Vitals/Pain Pain Assessment: 0-10 Pain Score: 4  Pain Location: L Knee Pain Descriptors / Indicators: Guarding;Grimacing;Numbness Pain Intervention(s): Limited activity within patient's tolerance;Monitored during session;Repositioned    Home Living Family/patient expects to be discharged to:: Private residence Living Arrangements: Spouse/significant other Available Help at Discharge: Family;Available 24 hours/day Type of Home: House Home Access: Stairs to enter Entrance Stairs-Rails: None(Small step) Entrance Stairs-Number of Steps: 1 Home Layout: Two level;Able to live on main level with bedroom/bathroom Home Equipment: Grab bars -  tub/shower;Cane - single point Additional Comments: Pt reports using a plastic lawn chair in the shower    Prior Function Level of Independence: Independent;Independent with  assistive device(s)         Comments: Pt reports using cane for balance occasionally     Hand Dominance   Dominant Hand: Right    Extremity/Trunk Assessment   Upper Extremity Assessment Upper Extremity Assessment: Generalized weakness    Lower Extremity Assessment Lower Extremity Assessment: LLE deficits/detail;Generalized weakness;RLE deficits/detail RLE Deficits / Details: Pt reported numbness from the waist down, with some sensation present.  RLE Sensation: decreased light touch;decreased proprioception LLE Deficits / Details: Pain and deficits as expected s/p L TKR. Pt reported numbness from the waist down, however reported some sensation present. LLE Sensation: decreased light touch;decreased proprioception    Cervical / Trunk Assessment Cervical / Trunk Assessment: Kyphotic  Communication   Communication: HOH  Cognition Arousal/Alertness: Awake/alert Behavior During Therapy: WFL for tasks assessed/performed Overall Cognitive Status: Within Functional Limits for tasks assessed                                 General Comments: Pt appears to be HOH, and did not always respond to some questions, or respond about a related topic, but not to exact question. Some delay in answering.       General Comments General comments (skin integrity, edema, etc.): Pt reported his butt was numb from sitting in the bed for so long and expressed desire to move.     Exercises Total Joint Exercises Ankle Circles/Pumps: AROM;Both;20 reps;Supine Quad Sets: AROM;Left;10 reps;Supine Heel Slides: 10 reps;Supine;AAROM;Left Marching in Standing: AROM;5 reps;Both;Standing;Other (comment)(With ModA+2)   Assessment/Plan    PT Assessment Patient needs continued PT services  PT Problem List Decreased strength;Decreased range of motion;Decreased balance;Decreased mobility;Decreased knowledge of use of DME;Decreased knowledge of precautions;Pain;Impaired sensation       PT  Treatment Interventions DME instruction;Gait training;Stair training;Functional mobility training;Therapeutic activities;Therapeutic exercise;Balance training;Patient/family education    PT Goals (Current goals can be found in the Care Plan section)  Acute Rehab PT Goals Patient Stated Goal: To get home and garden; to "get back to moving" PT Goal Formulation: With patient Time For Goal Achievement: 04/04/18 Potential to Achieve Goals: Fair    Frequency 7X/week   Barriers to discharge        Co-evaluation               AM-PAC PT "6 Clicks" Daily Activity  Outcome Measure Difficulty turning over in bed (including adjusting bedclothes, sheets and blankets)?: Unable Difficulty moving from lying on back to sitting on the side of the bed? : Unable Difficulty sitting down on and standing up from a chair with arms (e.g., wheelchair, bedside commode, etc,.)?: Unable Help needed moving to and from a bed to chair (including a wheelchair)?: A Lot Help needed walking in hospital room?: A Lot Help needed climbing 3-5 steps with a railing? : Total 6 Click Score: 8    End of Session Equipment Utilized During Treatment: Gait belt Activity Tolerance: Patient tolerated treatment well Patient left: in chair;with call bell/phone within reach;with chair alarm set Nurse Communication: Mobility status PT Visit Diagnosis: Unsteadiness on feet (R26.81);Other abnormalities of gait and mobility (R26.89);Repeated falls (R29.6);Muscle weakness (generalized) (M62.81);History of falling (Z91.81);Difficulty in walking, not elsewhere classified (R26.2);Pain Pain - Right/Left: Left Pain - part of body: Knee    Time: 6606-3016 PT Time Calculation (min) (  ACUTE ONLY): 35 min   Charges:   PT Evaluation $PT Eval Low Complexity: 1 Low PT Treatments $Therapeutic Activity: 8-22 mins   PT G Codes:       Elwin Mocha, S-DPT Acute Care Rehab Student [phone]   03/21/2018, 6:31 PM

## 2018-03-21 NOTE — Anesthesia Preprocedure Evaluation (Signed)
Anesthesia Evaluation  Patient identified by MRN, date of birth, ID band Patient awake    Reviewed: Allergy & Precautions, NPO status , Patient's Chart, lab work & pertinent test results, reviewed documented beta blocker date and time   Airway Mallampati: II  TM Distance: >3 FB Neck ROM: Full    Dental no notable dental hx. (+) Teeth Intact   Pulmonary former smoker,    Pulmonary exam normal breath sounds clear to auscultation       Cardiovascular hypertension, Pt. on medications + CAD, + Past MI and + Cardiac Stents  Normal cardiovascular exam+ Valvular Problems/Murmurs  Rhythm:Regular Rate:Normal  Echo 2016- Normal LV size with mild LV hypertrophy. EF 55% with basal anterolateral hypokinesis. Mildly dilated RV with normal systolic function.  EKG 02/2018- Sinus bradycardia with PAC's vs sinus arrhytmia   Neuro/Psych PSYCHIATRIC DISORDERS Anxiety Depression  Neuromuscular disease CVA, No Residual Symptoms    GI/Hepatic Neg liver ROS, GERD  Medicated and Controlled,Hx/o Barrett's esophagus Hx/o diverticulosis   Endo/Other  Hyperlipidemia  Renal/GU Renal diseaseHx/o renal calculi   BPH ED Elevated PSA Nocturia    Musculoskeletal  (+) Arthritis , Osteoarthritis,  OA left knee Lumbosacral radiculopathy- right leg numbness   Abdominal   Peds  Hematology  (+) anemia ,   Anesthesia Other Findings   Reproductive/Obstetrics                             Anesthesia Physical Anesthesia Plan  ASA: III  Anesthesia Plan: Spinal   Post-op Pain Management:  Regional for Post-op pain   Induction: Intravenous  PONV Risk Score and Plan: 2 and Ondansetron, Propofol infusion and Treatment may vary due to age or medical condition  Airway Management Planned: Natural Airway and Simple Face Mask  Additional Equipment:   Intra-op Plan:   Post-operative Plan:   Informed Consent: I have reviewed the  patients History and Physical, chart, labs and discussed the procedure including the risks, benefits and alternatives for the proposed anesthesia with the patient or authorized representative who has indicated his/her understanding and acceptance.     Plan Discussed with: CRNA and Surgeon  Anesthesia Plan Comments:         Anesthesia Quick Evaluation

## 2018-03-21 NOTE — Care Plan (Signed)
Spoke with patient. States he will discharge to home with the following.   DME:  CPM, RW to be delivered to home from [facility] prior to surgery   HHPT: [facility]: [place] 04/05/18 prior to MD visit  MD follow up:  04/05/18 @ 400.   Please contact Ladell Heads, [place] with questions or if this plan should need to change.   Thanks

## 2018-03-21 NOTE — Anesthesia Postprocedure Evaluation (Signed)
Anesthesia Post Note  Patient: Jose [name]  Procedure(s) Performed: LEFT TOTAL KNEE ARTHROPLASTY (Left Knee)     Patient location during evaluation: PACU Anesthesia Type: Spinal Level of consciousness: oriented and awake and alert Pain management: pain level controlled Vital Signs Assessment: post-procedure vital signs reviewed and stable Respiratory status: spontaneous breathing, respiratory function stable and patient connected to nasal cannula oxygen Cardiovascular status: blood pressure returned to baseline and stable Postop Assessment: no headache, no backache and no apparent nausea or vomiting Anesthetic complications: no    Last Vitals:  Vitals:   03/21/18 1349 03/21/18 1427  BP: 131/86 139/82  Pulse: (!) 45 (!) 46  Resp: 12 16  Temp: (!) 36.2 C   SpO2: 93% 95%    Last Pain:  Vitals:   03/21/18 1300  PainSc: 4                  Anushka Hartinger DAVID

## 2018-03-22 ENCOUNTER — Other Ambulatory Visit: Payer: Self-pay

## 2018-03-22 ENCOUNTER — Encounter (HOSPITAL_COMMUNITY): Payer: Self-pay | Admitting: General Practice

## 2018-03-22 DIAGNOSIS — M1712 Unilateral primary osteoarthritis, left knee: Secondary | ICD-10-CM | POA: Diagnosis not present

## 2018-03-22 DIAGNOSIS — E785 Hyperlipidemia, unspecified: Secondary | ICD-10-CM | POA: Diagnosis not present

## 2018-03-22 DIAGNOSIS — K2270 Barrett's esophagus without dysplasia: Secondary | ICD-10-CM | POA: Diagnosis not present

## 2018-03-22 DIAGNOSIS — I10 Essential (primary) hypertension: Secondary | ICD-10-CM | POA: Diagnosis not present

## 2018-03-22 DIAGNOSIS — E1165 Type 2 diabetes mellitus with hyperglycemia: Secondary | ICD-10-CM | POA: Diagnosis not present

## 2018-03-22 DIAGNOSIS — I2510 Atherosclerotic heart disease of native coronary artery without angina pectoris: Secondary | ICD-10-CM | POA: Diagnosis not present

## 2018-03-22 NOTE — Progress Notes (Signed)
Physical Therapy Treatment Patient Details Name: ASHELY JOSHUA MRN: [id number] DOB: [birth year] Today's Date: 03/22/2018    History of Present Illness Cornel Werber is a 77 y.o. M s/p L TKR. PMH includes CAD, HTN, HLD, Cardiac Stents, Diverticulitis, Anxiety, and Barrett's Esophagus.     PT Comments    Pt making steady progress with functional mobility and tolerated short distance ambulation in his room with RW and min guard for safety. Pt remains limited secondary to pain and fatigue. Will attempt stair training at next session this afternoon if appropriate. Pt would continue to benefit from skilled physical therapy services at this time while admitted and after d/c to address the below listed limitations in order to improve overall safety and independence with functional mobility.    Follow Up Recommendations  Home health PT;Supervision/Assistance - 24 hour     Equipment Recommendations  3in1 (PT);Rolling walker with 5" wheels    Recommendations for Other Services       Precautions / Restrictions Precautions Precautions: Knee;Fall Precaution Comments: PT reviewed positioning precautions of LE following knee surgery with pt Restrictions Weight Bearing Restrictions: Yes LLE Weight Bearing: Weight bearing as tolerated    Mobility  Bed Mobility Overal bed mobility: Needs Assistance Bed Mobility: Supine to Sit     Supine to sit: Min guard     General bed mobility comments: increased time and effort, min guard for safety  Transfers Overall transfer level: Needs assistance Equipment used: Rolling walker (2 wheeled) Transfers: Sit to/from Stand Sit to Stand: Min guard         General transfer comment: increased time and effort; pt demonstrated good technique, min guard for safety  Ambulation/Gait Ambulation/Gait assistance: Min guard Gait Distance (Feet): 30 Feet Assistive device: Rolling walker (2 wheeled) Gait Pattern/deviations: Step-to pattern;Decreased  step length - right;Decreased stance time - left;Decreased stride length;Decreased weight shift to left;Antalgic Gait velocity: decreased Gait velocity interpretation: <1.31 ft/sec, indicative of household ambulator General Gait Details: pt with mild instability but no overt LOB or need for physical assistance, min guard for safety. pt limited secondary to pain and fatigue. pt using a step-to gait pattern and with decreased weight bearing on L LE   Stairs             Wheelchair Mobility    Modified Rankin (Stroke Patients Only)       Balance Overall balance assessment: Needs assistance Sitting-balance support: Feet supported Sitting balance-Leahy Scale: Fair     Standing balance support: During functional activity;Bilateral upper extremity supported Standing balance-Leahy Scale: Fair Standing balance comment: reliant on bilateral UEs on RW                            Cognition Arousal/Alertness: Awake/alert Behavior During Therapy: WFL for tasks assessed/performed Overall Cognitive Status: Within Functional Limits for tasks assessed                                        Exercises Total Joint Exercises Quad Sets: AROM;Left;10 reps;Seated Long Arc Quad: AROM;Strengthening;Left;10 reps;Seated Knee Flexion: AROM;Strengthening;Left;10 reps;Seated    General Comments        Pertinent Vitals/Pain Pain Assessment: Faces Faces Pain Scale: Hurts little more Pain Location: L Knee Pain Descriptors / Indicators: Sore Pain Intervention(s): Monitored during session;Repositioned    Home Living Family/patient expects to be discharged to:: Private residence Living  Arrangements: Spouse/significant other                  Prior Function            PT Goals (current goals can now be found in the care plan section) Acute Rehab PT Goals PT Goal Formulation: With patient Time For Goal Achievement: 04/04/18 Potential to Achieve Goals:  Good Progress towards PT goals: Progressing toward goals    Frequency    7X/week      PT Plan Current plan remains appropriate    Co-evaluation              AM-PAC PT "6 Clicks" Daily Activity  Outcome Measure  Difficulty turning over in bed (including adjusting bedclothes, sheets and blankets)?: A Little Difficulty moving from lying on back to sitting on the side of the bed? : A Little Difficulty sitting down on and standing up from a chair with arms (e.g., wheelchair, bedside commode, etc,.)?: Unable Help needed moving to and from a bed to chair (including a wheelchair)?: A Little Help needed walking in hospital room?: A Little Help needed climbing 3-5 steps with a railing? : A Lot 6 Click Score: 15    End of Session Equipment Utilized During Treatment: Gait belt Activity Tolerance: Patient limited by pain;Patient limited by fatigue Patient left: in chair;with call bell/phone within reach;with chair alarm set Nurse Communication: Mobility status PT Visit Diagnosis: Other abnormalities of gait and mobility (R26.89);Pain Pain - Right/Left: Left Pain - part of body: Knee     Time: 6979-4801 PT Time Calculation (min) (ACUTE ONLY): 14 min  Charges:  $Gait Training: 8-22 mins                    G Codes:       [place], [place], [place] 03/22/2018, 11:48 AM

## 2018-03-22 NOTE — Progress Notes (Signed)
Subjective: Patient reports pain as mild to moderate, controlled.  Tolerating diet.  Urinating.  No CP, SOB.    Objective:   VITALS:   Vitals:   03/21/18 1427 03/21/18 1928 03/22/18 0015 03/22/18 0414  BP: 139/82 123/70 111/72 125/79  Pulse: (!) 46 (!) 58 63 (!) 54  Resp: 16 18 18 16   Temp:  97.6 F (36.4 C) 98.2 F (36.8 C) 97.8 F (36.6 C)  TempSrc:  Oral Oral Oral  SpO2: 95% 93% 93% 98%   CBC Latest Ref Rng & Units 03/10/2018 12/19/2017 12/08/2017  WBC 4.0 - 10.5 K/uL 5.6 5.7 8.2  Hemoglobin 13.0 - 17.0 g/dL 15.2 14.7 15.1  Hematocrit 39.0 - 52.0 % 46.6 43.4 45.3  Platelets 150 - 400 K/uL 151 277.0 201   BMP Latest Ref Rng & Units 03/10/2018 12/19/2017 12/08/2017  Glucose 70 - 99 mg/dL 90 111(H) 103(H)  BUN 8 - 23 mg/dL 10 11 13   Creatinine 0.61 - 1.24 mg/dL 1.03 0.87 0.98  Sodium 135 - 145 mmol/L 142 137 139  Potassium 3.5 - 5.1 mmol/L 4.1 3.7 4.3  Chloride 98 - 111 mmol/L 109 103 105  CO2 22 - 32 mmol/L 24 24 24   Calcium 8.9 - 10.3 mg/dL 9.3 9.1 9.3   Intake/Output      07/23 0701 - 07/24 0700 07/24 0701 - 07/25 0700   P.O. 840    I.V. 2683.4    Other 75    Total Intake 3598.4    Urine 500    Blood 50    Total Output 550    Net +3048.4            Physical Exam: General: NAD.  Supine in bed.  Calm, conversant.  CPM in place. Resp: No increased wob Cardio: low normal rate.  Regular rhythm ABD soft Neurologically intact MSK Neurovascularly intact Sensation intact distally Feet warm Dorsiflexion/Plantar flexion intact Incision: dressing C/D/I   Assessment: 1 Day Post-Op  S/P Procedure(s) (LRB): LEFT TOTAL KNEE ARTHROPLASTY (Left) by Dr. Ernesta Amble. Percell Miller on 03/21/2018  Principal Problem:   Primary osteoarthritis of left knee Active Problems:   Hyperlipidemia   Atherosclerosis of coronary artery   Barrett's esophagus   Hyperglycemia   Elevated PSA   Hypertension   Primary osteoarthritis of knee   Primary osteoarthritis, status post left  total knee arthroplasty Doing well postop day 1. Eating, drinking, and voiding. Pain controlled. Room exercises only so far with PT. Likely fit for discharge later today after mobilization with therapy.  Plan: Up with therapy Incentive Spirometry Elevate and Apply ice CPM, bone foam  Weight Bearing: Weight Bearing as Tolerated (WBAT) LLE Dressings: Maintain Mepilex.  Please apply thigh high TED hose to operative leg prior to discharge. VTE prophylaxis: Aspirin, SCDs, ambulation Dispo: Home later today after mobilization with therapy.   Patient's anticipated LOS is less than 2 midnights, meeting these requirements: - Lives within 1 hour of care - Has a competent adult at home to recover with post-op recover - NO history of             - Diabetes             - Respiratory Failure/COPD             - Renal failure             - Anemia             - Advanced Liver disease    Jose [name]  Cherlyn Cushing III, PA-C 03/22/2018, 7:39 AM

## 2018-03-22 NOTE — Progress Notes (Addendum)
Physical Therapy Treatment Patient Details Name: Jose [name] MRN: [id number] DOB: March 11, 1941 Today's Date: 03/22/2018    History of Present Illness Jose [name] is a 77 y.o. M s/p L TKR. PMH includes CAD, HTN, HLD, Cardiac Stents, Diverticulitis, Anxiety, and Barrett's Esophagus.     PT Comments    Pt making steady progress with functional mobility and tolerated ambulating a further distance into hallway. He continues to demonstrate instability with ambulation requiring use of RW and min guard for safety. Pt remains limited secondary to pain in L thigh and fatigue. Pt would continue to benefit from skilled physical therapy services at this time while admitted and after d/c to address the below listed limitations in order to improve overall safety and independence with functional mobility.   Follow Up Recommendations  Home health PT;Supervision/Assistance - 24 hour     Equipment Recommendations  3in1 (PT);Rolling walker with 5" wheels    Recommendations for Other Services       Precautions / Restrictions Precautions Precautions: Knee;Fall Precaution Comments: PT reviewed positioning precautions of LE following knee surgery with pt Restrictions Weight Bearing Restrictions: Yes LLE Weight Bearing: Weight bearing as tolerated    Mobility  Bed Mobility Overal bed mobility: Needs Assistance Bed Mobility: Supine to Sit     Supine to sit: Supervision Sit to supine: Min assist   General bed mobility comments: increased time and effort, supervision for safety; pt transitioned into sitting towards his R  Transfers Overall transfer level: Needs assistance Equipment used: Rolling walker (2 wheeled) Transfers: Sit to/from Stand Sit to Stand: Min guard         General transfer comment: increased time and effort, cueing for safe hand placement, min guard for safety  Ambulation/Gait Ambulation/Gait assistance: Min guard Gait Distance (Feet): 100 Feet Assistive device:  Rolling walker (2 wheeled) Gait Pattern/deviations: Step-to pattern;Decreased step length - right;Decreased stance time - left;Decreased stride length;Decreased weight shift to left;Antalgic Gait velocity: decreased Gait velocity interpretation: <1.31 ft/sec, indicative of household ambulator General Gait Details: pt required frequent standing rest breaks secondary to L LE pain and fatigue. pt with mild instability and maintaining L knee flexion throughout gait with heavy reliance on bilateral UEs on RW   Stairs             Wheelchair Mobility    Modified Rankin (Stroke Patients Only)       Balance Overall balance assessment: Needs assistance Sitting-balance support: Feet supported Sitting balance-Leahy Scale: Good     Standing balance support: During functional activity;Bilateral upper extremity supported Standing balance-Leahy Scale: Poor Standing balance comment: reliant on bilateral UEs on RW                            Cognition Arousal/Alertness: Awake/alert Behavior During Therapy: WFL for tasks assessed/performed Overall Cognitive Status: Within Functional Limits for tasks assessed                                        Exercises Total Joint Exercises Goniometric ROM: Flexion = ~60 degrees; Extension = lacking ~10 degrees to neutral    General Comments        Pertinent Vitals/Pain Pain Assessment: Faces Faces Pain Scale: Hurts even more Pain Location: L thigh Pain Descriptors / Indicators: Sore Pain Intervention(s): Monitored during session;Repositioned;Patient requesting pain meds-RN notified    Home Living Family/patient expects  to be discharged to:: Private residence Living Arrangements: Spouse/significant other Available Help at Discharge: Family;Available 24 hours/day Type of Home: House Home Access: Stairs to enter   Home Layout: Two level;Able to live on main level with bedroom/bathroom Home Equipment: Grab bars -  tub/shower;Cane - single point Additional Comments: 3 in 1 and RW delivered to room    Prior Function Level of Independence: Independent;Independent with assistive device(s)      Comments: Pt reports using cane for balance occasionally   PT Goals (current goals can now be found in the care plan section) Acute Rehab PT Goals Patient Stated Goal: To get home and garden; to "get back to moving" PT Goal Formulation: With patient Time For Goal Achievement: 04/04/18 Potential to Achieve Goals: Good Progress towards PT goals: Progressing toward goals    Frequency    7X/week      PT Plan Current plan remains appropriate    Co-evaluation              AM-PAC PT "6 Clicks" Daily Activity  Outcome Measure  Difficulty turning over in bed (including adjusting bedclothes, sheets and blankets)?: A Little Difficulty moving from lying on back to sitting on the side of the bed? : A Little Difficulty sitting down on and standing up from a chair with arms (e.g., wheelchair, bedside commode, etc,.)?: Unable Help needed moving to and from a bed to chair (including a wheelchair)?: A Little Help needed walking in hospital room?: A Little Help needed climbing 3-5 steps with a railing? : A Lot 6 Click Score: 15    End of Session Equipment Utilized During Treatment: Gait belt Activity Tolerance: Patient limited by pain;Patient limited by fatigue Patient left: in chair;with call bell/phone within reach;with chair alarm set Nurse Communication: Mobility status PT Visit Diagnosis: Other abnormalities of gait and mobility (R26.89);Pain Pain - Right/Left: Left Pain - part of body: Knee     Time: 3212-2482 PT Time Calculation (min) (ACUTE ONLY): 23 min  Charges:  $Gait Training: 8-22 mins $Therapeutic Activity: 8-22 mins                    G Codes:       [place], [place], [place] 03/22/2018, 4:12 PM

## 2018-03-22 NOTE — Evaluation (Signed)
Occupational Therapy Evaluation and Discharge Patient Details Name: Jose [name] MRN: [id number] DOB: [birth year] Today's Date: 03/22/2018    History of Present Illness Jose [name] is a 77 y.o. M s/p L TKR. PMH includes CAD, HTN, HLD, Cardiac Stents, Diverticulitis, Anxiety, and Barrett's Esophagus.    Clinical Impression   Pt is typically modified independent in self care and walks with a cane. Present with post op knee pain and impaired standing balance. He requires min assist for ADL. Educated pt in fall prevention, compensatory strategies for ADL and multiple uses of 3 in 1. Pt will have assistance of his wife at home as needed. No further OT needs.    Follow Up Recommendations  No OT follow up    Equipment Recommendations  None recommended by OT    Recommendations for Other Services       Precautions / Restrictions Precautions Precautions: Knee;Fall Precaution Comments: PT reviewed positioning precautions of LE following knee surgery with pt Restrictions Weight Bearing Restrictions: Yes LLE Weight Bearing: Weight bearing as tolerated      Mobility Bed Mobility Overal bed mobility: Needs Assistance Bed Mobility: Supine to Sit;Sit to Supine     Supine to sit: Supervision Sit to supine: Min assist   General bed mobility comments: very minimal assist to raise L LE up into bed  Transfers Overall transfer level: Needs assistance Equipment used: Rolling walker (2 wheeled) Transfers: Sit to/from Stand Sit to Stand: Min guard         General transfer comment: increased time and effort; pt demonstrated good technique, min guard for safety    Balance Overall balance assessment: Needs assistance Sitting-balance support: Feet supported Sitting balance-Leahy Scale: Good     Standing balance support: During functional activity;Bilateral upper extremity supported Standing balance-Leahy Scale: Fair Standing balance comment: reliant on bilateral UEs on RW                            ADL either performed or assessed with clinical judgement   ADL Overall ADL's : Needs assistance/impaired Eating/Feeding: Independent;Bed level   Grooming: Wash/dry hands;Standing;Min guard   Upper Body Bathing: Set up;Sitting   Lower Body Bathing: Minimal assistance;Sit to/from stand Lower Body Bathing Details (indicate cue type and reason): recommended long handled bath sponge Upper Body Dressing : Set up;Sitting   Lower Body Dressing: Minimal assistance;Sit to/from stand Lower Body Dressing Details (indicate cue type and reason): instructed in compensatory strategies, pt will rely on wife to assist, no interest in AE Toilet Transfer: Min guard;Ambulation;RW;BSC(over toilet)   Toileting- Clothing Manipulation and Hygiene: Min guard;Sit to/from stand       Functional mobility during ADLs: Min guard;Rolling walker       Vision Baseline Vision/History: Wears glasses Wears Glasses: Reading only Patient Visual Report: No change from baseline       Perception     Praxis      Pertinent Vitals/Pain Pain Assessment: Faces Faces Pain Scale: Hurts little more Pain Location: L Knee Pain Descriptors / Indicators: Sore Pain Intervention(s): Patient requesting pain meds-RN notified;RN gave pain meds during session     Hand Dominance Right   Extremity/Trunk Assessment Upper Extremity Assessment Upper Extremity Assessment: Overall WFL for tasks assessed   Lower Extremity Assessment Lower Extremity Assessment: Defer to PT evaluation   Cervical / Trunk Assessment Cervical / Trunk Assessment: Kyphotic   Communication Communication Communication: HOH   Cognition Arousal/Alertness: Awake/alert Behavior During Therapy: WFL for tasks assessed/performed  Overall Cognitive Status: Within Functional Limits for tasks assessed                                     General Comments       Exercises    Shoulder Instructions       Home Living Family/patient expects to be discharged to:: Private residence Living Arrangements: Spouse/significant other Available Help at Discharge: Family;Available 24 hours/day Type of Home: House Home Access: Stairs to enter [employer] of Steps: 1   Home Layout: Two level;Able to live on main level with bedroom/bathroom Alternate Level Stairs-Number of Steps: 12 Alternate Level Stairs-Rails: Left Bathroom Shower/Tub: Occupational psychologist: Standard     Home Equipment: Grab bars - tub/shower;Cane - single point   Additional Comments: 3 in 1 and RW delivered to room      Prior Functioning/Environment Level of Independence: Independent;Independent with assistive device(s)        Comments: Pt reports using cane for balance occasionally        OT Problem List:        OT Treatment/Interventions:      OT Goals(Current goals can be found in the care plan section) Acute Rehab OT Goals Patient Stated Goal: To get home and garden; to "get back to moving"  OT Frequency:     Barriers to D/C:            Co-evaluation              AM-PAC PT "6 Clicks" Daily Activity     Outcome Measure Help from another person eating meals?: None Help from another person taking care of personal grooming?: A Little Help from another person toileting, which includes using toliet, bedpan, or urinal?: A Little Help from another person bathing (including washing, rinsing, drying)?: A Little Help from another person to put on and taking off regular upper body clothing?: None Help from another person to put on and taking off regular lower body clothing?: A Little 6 Click Score: 20   End of Session Equipment Utilized During Treatment: Gait belt;Rolling walker CPM Left Knee CPM Left Knee: On Left Knee Flexion (Degrees): 90 Left Knee Extension (Degrees): 0 Nurse Communication: Patient requests pain meds  Activity Tolerance: Patient tolerated treatment  well Patient left: in bed;in CPM;with call bell/phone within reach  OT Visit Diagnosis: Other abnormalities of gait and mobility (R26.89)                Time: 1610-9604 OT Time Calculation (min): 20 min Charges:  OT General Charges $OT Visit: 1 Visit OT Evaluation $OT Eval Low Complexity: 1 Low G-Codes:     04/06/2018 Nestor Lewandowsky, OTR/L Pager: [phone]  Werner Lean Haze [place] Apr 06, 2018, 1:53 PM

## 2018-03-24 NOTE — Discharge Summary (Signed)
Discharge Summary  Patient ID: Jose [name] MRN: [id number] DOB/AGE: [birth year] 77 y.o.  Admit date: 03/21/2018 Discharge date: 03/24/2018  Admission Diagnoses:  Primary osteoarthritis of left knee  Discharge Diagnoses:  Principal Problem:   Primary osteoarthritis of left knee Active Problems:   Hyperlipidemia   Atherosclerosis of coronary artery   Barrett's esophagus   Hyperglycemia   Elevated PSA   Hypertension   Primary osteoarthritis of knee   Past Medical History:  Diagnosis Date  . Acute lower GI bleeding 01/25/2013; 09/2014   diverticular bleed  . ANXIETY 08/22/2008  . Arthritis    "a little bit" (04/14/2015)  . Barrett's esophagus 2003   hematochezia as a result since 2003  . Basal cell cancer    removed from right forearm.   . Bradycardia 04/14/2015  . CORONARY ARTERY DISEASE    2 stents  . Depression   . DIVERTICULOSIS, COLON 2003  . ERECTILE DYSFUNCTION 08/22/2008  . GERD (gastroesophageal reflux disease)   . Heart murmur 04/14/2015   "they think I did; that's why I'm here" (04/14/2015)  . History of kidney stones   . HYPERLIPIDEMIA 02/09/2007  . Kidney stones    "blasted or passed" (04/14/2015)  . MI (myocardial infarction) ([place]) 2000  . Numbness    right side - history of   . OBESITY 03/20/2010  . Squamous cell cancer of skin of earlobe    removed from area behind right earlobe  . Stroke [facility])    4 yrs ago possible mini stroke -  tests never found anything    Surgeries: Procedure(s): LEFT TOTAL KNEE ARTHROPLASTY on 03/21/2018   Consultants (if any):   Discharged Condition: Improved  Hospital Course: Jose [name] is an 77 y.o. male who was admitted 03/21/2018 with a diagnosis of Primary osteoarthritis of left knee and went to the operating room on 03/21/2018 and underwent the above named procedures.    He was given perioperative antibiotics:  Anti-infectives (From admission, onward)   Start     Dose/Rate Route Frequency Ordered Stop    03/21/18 1600  ceFAZolin (ANCEF) IVPB 2g/100 mL premix     2 g 200 mL/hr over 30 Minutes Intravenous Every 6 hours 03/21/18 1414 03/21/18 2216   03/21/18 0800  ceFAZolin (ANCEF) IVPB 2g/100 mL premix     2 g 200 mL/hr over 30 Minutes Intravenous On call to O.R. 03/21/18 0757 03/21/18 1018   03/21/18 0800  ceFAZolin (ANCEF) 2-4 GM/100ML-% IVPB    Note to Pharmacy:  Debbe Bales, Meredit: cabinet override      03/21/18 0800 03/21/18 1018    .  He was given sequential compression devices, early ambulation, and Aspirin for DVT prophylaxis.  He benefited maximally from the hospital stay and there were no complications.    Recent vital signs:  Vitals:   03/22/18 0414 03/22/18 1355  BP: 125/79 126/83  Pulse: (!) 54 (!) 102  Resp: 16 16  Temp: 97.8 F (36.6 C) 97.7 F (36.5 C)  SpO2: 98% (!) 86%    Recent laboratory studies:  Lab Results  Component Value Date   HGB 15.2 03/10/2018   HGB 14.7 12/19/2017   HGB 15.1 12/08/2017   Lab Results  Component Value Date   WBC 5.6 03/10/2018   PLT 151 03/10/2018   Lab Results  Component Value Date   INR 1.09 04/14/2015   Lab Results  Component Value Date   NA 142 03/10/2018   K 4.1 03/10/2018   CL 109 03/10/2018  CO2 24 03/10/2018   BUN 10 03/10/2018   CREATININE 1.03 03/10/2018   GLUCOSE 90 03/10/2018    Discharge Medications:   Allergies as of 03/22/2018      Reactions   Atorvastatin    REACTION: muscle weakness      Medication List    STOP taking these medications   oxyCODONE-acetaminophen 5-325 MG tablet Commonly known as:  PERCOCET/ROXICET     TAKE these medications   acetaminophen 500 MG tablet Commonly known as:  TYLENOL Take 2 tablets (1,000 mg total) by mouth every 8 (eight) hours for 14 days. For Pain. What changed:    when to take this  reasons to take this  additional instructions   ALPRAZolam 0.25 MG tablet Commonly known as:  XANAX TAKE 1 TABLET BY MOUTH EVERY DAY (MAX 3 DOSES PER WEEK)    aspirin EC 81 MG tablet Take 1 tablet (81 mg total) by mouth 2 (two) times daily. For DVT prophylaxis What changed:    when to take this  additional instructions   baclofen 10 MG tablet Commonly known as:  LIORESAL Take 1 tablet (10 mg total) by mouth 3 (three) times daily as needed for muscle spasms.   BLUE-EMU MAXIMUM STRENGTH EX Apply 1 application topically daily as needed (knee pain).   gabapentin 100 MG capsule Commonly known as:  NEURONTIN Take 1 capsule (100 mg total) by mouth 2 (two) times daily for 10 days.   GLUCOSAMINE PO Take 2 tablets by mouth daily.   omeprazole 20 MG capsule Commonly known as:  PRILOSEC TAKE 1 CAPSULE DAILY   ondansetron 4 MG tablet Commonly known as:  ZOFRAN Take 1 tablet (4 mg total) by mouth every 8 (eight) hours as needed for nausea or vomiting.   oxyCODONE 5 MG immediate release tablet Commonly known as:  ROXICODONE Take 1 tablet (5 mg total) by mouth every 4 (four) hours as needed for breakthrough pain.   rosuvastatin 20 MG tablet Commonly known as:  CRESTOR Take 1 tablet (20 mg total) by mouth daily. What changed:  Another medication with the same name was removed. Continue taking this medication, and follow the directions you see here.   senna-docusate 8.6-50 MG tablet Commonly known as:  Senokot-S Take 1 tablet by mouth 2 (two) times daily. While taking strongest pain meds to prevent constipation. What changed:    when to take this  reasons to take this  additional instructions   tadalafil 20 MG tablet Commonly known as:  ADCIRCA/CIALIS TAKE ONE-HALF (1/2) TABLET DAILY AS NEEDED FOR ERECTILE DYSFUNCTION   traMADol 50 MG tablet Commonly known as:  ULTRAM Take 1-2 tablets (50-100 mg total) by mouth every 6 (six) hours as needed for severe pain. Post-operatively       Diagnostic Studies: Dg Knee Left Port  Result Date: 03/21/2018 CLINICAL DATA:  Status post left total knee joint replacement. EXAM: PORTABLE LEFT  KNEE - 1-2 VIEW COMPARISON:  None in PACs FINDINGS: The patient has undergone total left knee joint prosthesis placement. Radiographic positioning of the prosthetic components is good. The interface with the native bone is normal. There are small amounts of air in fluid in the anterior soft tissues. IMPRESSION: No immediate postprocedure complication following left total knee joint prosthesis placement. Electronically Signed   By: David  Martinique M.D.   On: 03/21/2018 12:42    Disposition: Discharge disposition: 01-Home or Self Care       Discharge Instructions    Discharge patient   Complete  by:  As directed    After safe mobilization with therapy.   Discharge disposition:  01-Home or Self Care   Discharge patient date:  03/22/2018      Follow-up Information    Renette Butters, MD.   Specialty:  Orthopedic Surgery Contact information: [address] [place] [address] [phone]            Signed: Prudencio Burly III PA-C 03/24/2018, 2:16 PM

## 2018-03-25 DIAGNOSIS — F419 Anxiety disorder, unspecified: Secondary | ICD-10-CM | POA: Diagnosis not present

## 2018-03-25 DIAGNOSIS — I10 Essential (primary) hypertension: Secondary | ICD-10-CM | POA: Diagnosis not present

## 2018-03-25 DIAGNOSIS — K5790 Diverticulosis of intestine, part unspecified, without perforation or abscess without bleeding: Secondary | ICD-10-CM | POA: Diagnosis not present

## 2018-03-25 DIAGNOSIS — I2510 Atherosclerotic heart disease of native coronary artery without angina pectoris: Secondary | ICD-10-CM | POA: Diagnosis not present

## 2018-03-25 DIAGNOSIS — Z471 Aftercare following joint replacement surgery: Secondary | ICD-10-CM | POA: Diagnosis not present

## 2018-03-25 DIAGNOSIS — K2270 Barrett's esophagus without dysplasia: Secondary | ICD-10-CM | POA: Diagnosis not present

## 2018-03-27 DIAGNOSIS — K5790 Diverticulosis of intestine, part unspecified, without perforation or abscess without bleeding: Secondary | ICD-10-CM | POA: Diagnosis not present

## 2018-03-27 DIAGNOSIS — Z471 Aftercare following joint replacement surgery: Secondary | ICD-10-CM | POA: Diagnosis not present

## 2018-03-27 DIAGNOSIS — I2510 Atherosclerotic heart disease of native coronary artery without angina pectoris: Secondary | ICD-10-CM | POA: Diagnosis not present

## 2018-03-27 DIAGNOSIS — F419 Anxiety disorder, unspecified: Secondary | ICD-10-CM | POA: Diagnosis not present

## 2018-03-27 DIAGNOSIS — I10 Essential (primary) hypertension: Secondary | ICD-10-CM | POA: Diagnosis not present

## 2018-03-27 DIAGNOSIS — K2270 Barrett's esophagus without dysplasia: Secondary | ICD-10-CM | POA: Diagnosis not present

## 2018-03-29 DIAGNOSIS — F419 Anxiety disorder, unspecified: Secondary | ICD-10-CM | POA: Diagnosis not present

## 2018-03-29 DIAGNOSIS — I10 Essential (primary) hypertension: Secondary | ICD-10-CM | POA: Diagnosis not present

## 2018-03-29 DIAGNOSIS — I2510 Atherosclerotic heart disease of native coronary artery without angina pectoris: Secondary | ICD-10-CM | POA: Diagnosis not present

## 2018-03-29 DIAGNOSIS — K5790 Diverticulosis of intestine, part unspecified, without perforation or abscess without bleeding: Secondary | ICD-10-CM | POA: Diagnosis not present

## 2018-03-29 DIAGNOSIS — K2270 Barrett's esophagus without dysplasia: Secondary | ICD-10-CM | POA: Diagnosis not present

## 2018-03-29 DIAGNOSIS — Z471 Aftercare following joint replacement surgery: Secondary | ICD-10-CM | POA: Diagnosis not present

## 2018-03-31 DIAGNOSIS — K2270 Barrett's esophagus without dysplasia: Secondary | ICD-10-CM | POA: Diagnosis not present

## 2018-03-31 DIAGNOSIS — I10 Essential (primary) hypertension: Secondary | ICD-10-CM | POA: Diagnosis not present

## 2018-03-31 DIAGNOSIS — F419 Anxiety disorder, unspecified: Secondary | ICD-10-CM | POA: Diagnosis not present

## 2018-03-31 DIAGNOSIS — I2510 Atherosclerotic heart disease of native coronary artery without angina pectoris: Secondary | ICD-10-CM | POA: Diagnosis not present

## 2018-03-31 DIAGNOSIS — Z471 Aftercare following joint replacement surgery: Secondary | ICD-10-CM | POA: Diagnosis not present

## 2018-03-31 DIAGNOSIS — K5790 Diverticulosis of intestine, part unspecified, without perforation or abscess without bleeding: Secondary | ICD-10-CM | POA: Diagnosis not present

## 2018-04-03 DIAGNOSIS — I2510 Atherosclerotic heart disease of native coronary artery without angina pectoris: Secondary | ICD-10-CM | POA: Diagnosis not present

## 2018-04-03 DIAGNOSIS — F419 Anxiety disorder, unspecified: Secondary | ICD-10-CM | POA: Diagnosis not present

## 2018-04-03 DIAGNOSIS — Z471 Aftercare following joint replacement surgery: Secondary | ICD-10-CM | POA: Diagnosis not present

## 2018-04-03 DIAGNOSIS — I10 Essential (primary) hypertension: Secondary | ICD-10-CM | POA: Diagnosis not present

## 2018-04-03 DIAGNOSIS — K5790 Diverticulosis of intestine, part unspecified, without perforation or abscess without bleeding: Secondary | ICD-10-CM | POA: Diagnosis not present

## 2018-04-03 DIAGNOSIS — K2270 Barrett's esophagus without dysplasia: Secondary | ICD-10-CM | POA: Diagnosis not present

## 2018-04-05 DIAGNOSIS — M1712 Unilateral primary osteoarthritis, left knee: Secondary | ICD-10-CM | POA: Diagnosis not present

## 2018-04-11 DIAGNOSIS — R262 Difficulty in walking, not elsewhere classified: Secondary | ICD-10-CM | POA: Diagnosis not present

## 2018-04-11 DIAGNOSIS — M25662 Stiffness of left knee, not elsewhere classified: Secondary | ICD-10-CM | POA: Diagnosis not present

## 2018-04-11 DIAGNOSIS — M25562 Pain in left knee: Secondary | ICD-10-CM | POA: Diagnosis not present

## 2018-04-11 DIAGNOSIS — M6281 Muscle weakness (generalized): Secondary | ICD-10-CM | POA: Diagnosis not present

## 2018-04-13 DIAGNOSIS — M25662 Stiffness of left knee, not elsewhere classified: Secondary | ICD-10-CM | POA: Diagnosis not present

## 2018-04-13 DIAGNOSIS — M25562 Pain in left knee: Secondary | ICD-10-CM | POA: Diagnosis not present

## 2018-04-13 DIAGNOSIS — R262 Difficulty in walking, not elsewhere classified: Secondary | ICD-10-CM | POA: Diagnosis not present

## 2018-04-13 DIAGNOSIS — M6281 Muscle weakness (generalized): Secondary | ICD-10-CM | POA: Diagnosis not present

## 2018-04-14 DIAGNOSIS — R972 Elevated prostate specific antigen [PSA]: Secondary | ICD-10-CM | POA: Diagnosis not present

## 2018-04-17 DIAGNOSIS — M25662 Stiffness of left knee, not elsewhere classified: Secondary | ICD-10-CM | POA: Diagnosis not present

## 2018-04-17 DIAGNOSIS — M25562 Pain in left knee: Secondary | ICD-10-CM | POA: Diagnosis not present

## 2018-04-17 DIAGNOSIS — R262 Difficulty in walking, not elsewhere classified: Secondary | ICD-10-CM | POA: Diagnosis not present

## 2018-04-17 DIAGNOSIS — M6281 Muscle weakness (generalized): Secondary | ICD-10-CM | POA: Diagnosis not present

## 2018-04-19 DIAGNOSIS — M25662 Stiffness of left knee, not elsewhere classified: Secondary | ICD-10-CM | POA: Diagnosis not present

## 2018-04-19 DIAGNOSIS — S52572D Other intraarticular fracture of lower end of left radius, subsequent encounter for closed fracture with routine healing: Secondary | ICD-10-CM | POA: Diagnosis not present

## 2018-04-19 DIAGNOSIS — M6281 Muscle weakness (generalized): Secondary | ICD-10-CM | POA: Diagnosis not present

## 2018-04-19 DIAGNOSIS — R262 Difficulty in walking, not elsewhere classified: Secondary | ICD-10-CM | POA: Diagnosis not present

## 2018-04-19 DIAGNOSIS — S62656D Nondisplaced fracture of medial phalanx of right little finger, subsequent encounter for fracture with routine healing: Secondary | ICD-10-CM | POA: Diagnosis not present

## 2018-04-19 DIAGNOSIS — M25562 Pain in left knee: Secondary | ICD-10-CM | POA: Diagnosis not present

## 2018-04-19 DIAGNOSIS — S62654D Nondisplaced fracture of medial phalanx of right ring finger, subsequent encounter for fracture with routine healing: Secondary | ICD-10-CM | POA: Diagnosis not present

## 2018-04-19 DIAGNOSIS — S62652D Nondisplaced fracture of medial phalanx of right middle finger, subsequent encounter for fracture with routine healing: Secondary | ICD-10-CM | POA: Diagnosis not present

## 2018-04-20 DIAGNOSIS — M25562 Pain in left knee: Secondary | ICD-10-CM | POA: Diagnosis not present

## 2018-04-20 DIAGNOSIS — M6281 Muscle weakness (generalized): Secondary | ICD-10-CM | POA: Diagnosis not present

## 2018-04-20 DIAGNOSIS — M25662 Stiffness of left knee, not elsewhere classified: Secondary | ICD-10-CM | POA: Diagnosis not present

## 2018-04-20 DIAGNOSIS — R262 Difficulty in walking, not elsewhere classified: Secondary | ICD-10-CM | POA: Diagnosis not present

## 2018-04-21 ENCOUNTER — Encounter: Payer: Self-pay | Admitting: Family Medicine

## 2018-04-24 DIAGNOSIS — M25662 Stiffness of left knee, not elsewhere classified: Secondary | ICD-10-CM | POA: Diagnosis not present

## 2018-04-24 DIAGNOSIS — R262 Difficulty in walking, not elsewhere classified: Secondary | ICD-10-CM | POA: Diagnosis not present

## 2018-04-24 DIAGNOSIS — M25562 Pain in left knee: Secondary | ICD-10-CM | POA: Diagnosis not present

## 2018-04-24 DIAGNOSIS — M6281 Muscle weakness (generalized): Secondary | ICD-10-CM | POA: Diagnosis not present

## 2018-04-26 DIAGNOSIS — R262 Difficulty in walking, not elsewhere classified: Secondary | ICD-10-CM | POA: Diagnosis not present

## 2018-04-26 DIAGNOSIS — M25662 Stiffness of left knee, not elsewhere classified: Secondary | ICD-10-CM | POA: Diagnosis not present

## 2018-04-26 DIAGNOSIS — M6281 Muscle weakness (generalized): Secondary | ICD-10-CM | POA: Diagnosis not present

## 2018-04-26 DIAGNOSIS — M25562 Pain in left knee: Secondary | ICD-10-CM | POA: Diagnosis not present

## 2018-04-28 DIAGNOSIS — M6281 Muscle weakness (generalized): Secondary | ICD-10-CM | POA: Diagnosis not present

## 2018-04-28 DIAGNOSIS — R262 Difficulty in walking, not elsewhere classified: Secondary | ICD-10-CM | POA: Diagnosis not present

## 2018-04-28 DIAGNOSIS — M25662 Stiffness of left knee, not elsewhere classified: Secondary | ICD-10-CM | POA: Diagnosis not present

## 2018-04-28 DIAGNOSIS — M25562 Pain in left knee: Secondary | ICD-10-CM | POA: Diagnosis not present

## 2018-05-03 DIAGNOSIS — M25562 Pain in left knee: Secondary | ICD-10-CM | POA: Diagnosis not present

## 2018-05-03 DIAGNOSIS — M6281 Muscle weakness (generalized): Secondary | ICD-10-CM | POA: Diagnosis not present

## 2018-05-03 DIAGNOSIS — M25662 Stiffness of left knee, not elsewhere classified: Secondary | ICD-10-CM | POA: Diagnosis not present

## 2018-05-03 DIAGNOSIS — R262 Difficulty in walking, not elsewhere classified: Secondary | ICD-10-CM | POA: Diagnosis not present

## 2018-05-05 DIAGNOSIS — M25562 Pain in left knee: Secondary | ICD-10-CM | POA: Diagnosis not present

## 2018-05-05 DIAGNOSIS — R262 Difficulty in walking, not elsewhere classified: Secondary | ICD-10-CM | POA: Diagnosis not present

## 2018-05-05 DIAGNOSIS — M25662 Stiffness of left knee, not elsewhere classified: Secondary | ICD-10-CM | POA: Diagnosis not present

## 2018-05-05 DIAGNOSIS — M6281 Muscle weakness (generalized): Secondary | ICD-10-CM | POA: Diagnosis not present

## 2018-05-08 ENCOUNTER — Other Ambulatory Visit: Payer: Self-pay | Admitting: Family Medicine

## 2018-05-09 DIAGNOSIS — R262 Difficulty in walking, not elsewhere classified: Secondary | ICD-10-CM | POA: Diagnosis not present

## 2018-05-09 DIAGNOSIS — M25662 Stiffness of left knee, not elsewhere classified: Secondary | ICD-10-CM | POA: Diagnosis not present

## 2018-05-09 DIAGNOSIS — M6281 Muscle weakness (generalized): Secondary | ICD-10-CM | POA: Diagnosis not present

## 2018-05-09 DIAGNOSIS — M25562 Pain in left knee: Secondary | ICD-10-CM | POA: Diagnosis not present

## 2018-05-10 DIAGNOSIS — M25562 Pain in left knee: Secondary | ICD-10-CM | POA: Diagnosis not present

## 2018-05-10 DIAGNOSIS — M6281 Muscle weakness (generalized): Secondary | ICD-10-CM | POA: Diagnosis not present

## 2018-05-10 DIAGNOSIS — R262 Difficulty in walking, not elsewhere classified: Secondary | ICD-10-CM | POA: Diagnosis not present

## 2018-05-10 DIAGNOSIS — M25662 Stiffness of left knee, not elsewhere classified: Secondary | ICD-10-CM | POA: Diagnosis not present

## 2018-05-12 DIAGNOSIS — R262 Difficulty in walking, not elsewhere classified: Secondary | ICD-10-CM | POA: Diagnosis not present

## 2018-05-12 DIAGNOSIS — M25662 Stiffness of left knee, not elsewhere classified: Secondary | ICD-10-CM | POA: Diagnosis not present

## 2018-05-12 DIAGNOSIS — M6281 Muscle weakness (generalized): Secondary | ICD-10-CM | POA: Diagnosis not present

## 2018-05-12 DIAGNOSIS — M25562 Pain in left knee: Secondary | ICD-10-CM | POA: Diagnosis not present

## 2018-05-22 DIAGNOSIS — M25562 Pain in left knee: Secondary | ICD-10-CM | POA: Diagnosis not present

## 2018-05-22 DIAGNOSIS — M6281 Muscle weakness (generalized): Secondary | ICD-10-CM | POA: Diagnosis not present

## 2018-05-22 DIAGNOSIS — R262 Difficulty in walking, not elsewhere classified: Secondary | ICD-10-CM | POA: Diagnosis not present

## 2018-05-22 DIAGNOSIS — M25662 Stiffness of left knee, not elsewhere classified: Secondary | ICD-10-CM | POA: Diagnosis not present

## 2018-05-24 DIAGNOSIS — M25662 Stiffness of left knee, not elsewhere classified: Secondary | ICD-10-CM | POA: Diagnosis not present

## 2018-05-24 DIAGNOSIS — R262 Difficulty in walking, not elsewhere classified: Secondary | ICD-10-CM | POA: Diagnosis not present

## 2018-05-24 DIAGNOSIS — M6281 Muscle weakness (generalized): Secondary | ICD-10-CM | POA: Diagnosis not present

## 2018-05-24 DIAGNOSIS — M25562 Pain in left knee: Secondary | ICD-10-CM | POA: Diagnosis not present

## 2018-05-26 DIAGNOSIS — M25662 Stiffness of left knee, not elsewhere classified: Secondary | ICD-10-CM | POA: Diagnosis not present

## 2018-05-26 DIAGNOSIS — M6281 Muscle weakness (generalized): Secondary | ICD-10-CM | POA: Diagnosis not present

## 2018-05-26 DIAGNOSIS — M25562 Pain in left knee: Secondary | ICD-10-CM | POA: Diagnosis not present

## 2018-05-26 DIAGNOSIS — R262 Difficulty in walking, not elsewhere classified: Secondary | ICD-10-CM | POA: Diagnosis not present

## 2018-05-29 DIAGNOSIS — R262 Difficulty in walking, not elsewhere classified: Secondary | ICD-10-CM | POA: Diagnosis not present

## 2018-05-29 DIAGNOSIS — M6281 Muscle weakness (generalized): Secondary | ICD-10-CM | POA: Diagnosis not present

## 2018-05-29 DIAGNOSIS — M25562 Pain in left knee: Secondary | ICD-10-CM | POA: Diagnosis not present

## 2018-05-29 DIAGNOSIS — M25662 Stiffness of left knee, not elsewhere classified: Secondary | ICD-10-CM | POA: Diagnosis not present

## 2018-05-31 DIAGNOSIS — R262 Difficulty in walking, not elsewhere classified: Secondary | ICD-10-CM | POA: Diagnosis not present

## 2018-05-31 DIAGNOSIS — M25662 Stiffness of left knee, not elsewhere classified: Secondary | ICD-10-CM | POA: Diagnosis not present

## 2018-05-31 DIAGNOSIS — M25562 Pain in left knee: Secondary | ICD-10-CM | POA: Diagnosis not present

## 2018-05-31 DIAGNOSIS — M6281 Muscle weakness (generalized): Secondary | ICD-10-CM | POA: Diagnosis not present

## 2018-06-02 DIAGNOSIS — R262 Difficulty in walking, not elsewhere classified: Secondary | ICD-10-CM | POA: Diagnosis not present

## 2018-06-02 DIAGNOSIS — M25662 Stiffness of left knee, not elsewhere classified: Secondary | ICD-10-CM | POA: Diagnosis not present

## 2018-06-02 DIAGNOSIS — M6281 Muscle weakness (generalized): Secondary | ICD-10-CM | POA: Diagnosis not present

## 2018-06-02 DIAGNOSIS — M25562 Pain in left knee: Secondary | ICD-10-CM | POA: Diagnosis not present

## 2018-06-20 ENCOUNTER — Encounter: Payer: Self-pay | Admitting: Family Medicine

## 2018-06-20 ENCOUNTER — Ambulatory Visit (INDEPENDENT_AMBULATORY_CARE_PROVIDER_SITE_OTHER): Payer: Medicare Other | Admitting: Family Medicine

## 2018-06-20 VITALS — BP 130/70 | HR 61 | Temp 97.8°F | Ht 69.5 in | Wt 214.8 lb

## 2018-06-20 DIAGNOSIS — R739 Hyperglycemia, unspecified: Secondary | ICD-10-CM | POA: Diagnosis not present

## 2018-06-20 DIAGNOSIS — M1712 Unilateral primary osteoarthritis, left knee: Secondary | ICD-10-CM

## 2018-06-20 DIAGNOSIS — I2510 Atherosclerotic heart disease of native coronary artery without angina pectoris: Secondary | ICD-10-CM | POA: Diagnosis not present

## 2018-06-20 DIAGNOSIS — R972 Elevated prostate specific antigen [PSA]: Secondary | ICD-10-CM

## 2018-06-20 DIAGNOSIS — Z23 Encounter for immunization: Secondary | ICD-10-CM | POA: Diagnosis not present

## 2018-06-20 DIAGNOSIS — Z6831 Body mass index (BMI) 31.0-31.9, adult: Secondary | ICD-10-CM | POA: Diagnosis not present

## 2018-06-20 DIAGNOSIS — E785 Hyperlipidemia, unspecified: Secondary | ICD-10-CM | POA: Diagnosis not present

## 2018-06-20 NOTE — Patient Instructions (Addendum)
Health Maintenance Due  Topic Date Due  . TETANUS/TDAP - get your tetanus shot at pharmacy and update [place] on this when you get it- would  08/30/2013   Thanks for doing a flu shot!  If [facility] wants [place] to take over the prescription for tramadol- have them write [place] a letter with summary of joint issue and how tramadol is being used as well as the request for [place] to prescribe/monitor medication.   Could also try ask [facility] about voltaren gel- that is an antiinflammatory but its topical and not absorbed as much and that may help you  Tylenol/acetaminophen is a good choice for your pain if you want to try it instead of tramadol as well  Please stop by lab before you go

## 2018-06-20 NOTE — Assessment & Plan Note (Signed)
S: History of elevated A1c.  In 2016 was close to diabetes range at 6.4.  Weight has trended up about 10 pounds this year  Lab Results  Component Value Date   HGBA1C 5.9 12/19/2017   HGBA1C 5.8 07/28/2016   HGBA1C 6.4 (H) 04/15/2015   A/P: Update hemoglobin A1c

## 2018-06-20 NOTE — Assessment & Plan Note (Signed)
Referred to urology last visit with elevated PSA.  PSA came back down in August and they will follow him yearly for now per patient

## 2018-06-20 NOTE — Assessment & Plan Note (Addendum)
S:Using 1 tramadol in AM and 1 in PM for his knee- they are considering cutting him down on this at [facility]. History of knee replacement but still hurts on the left knee.  A/P: From avs "If [facility] wants [place] to take over the prescription for tramadol- have them write [place] a letter with summary of joint issue and how tramadol is being used as well as the request for [place] to prescribe/monitor medication. ". dont see xanax on med list- ideally would not want to have him on these medications together.

## 2018-06-20 NOTE — Assessment & Plan Note (Signed)
S:  controlled on rosuvastatin 20 mg in the past  A/P: No recent lipid panel-we will update lipids

## 2018-06-20 NOTE — Progress Notes (Signed)
Subjective:  Jose [name] is a 77 y.o. year old very pleasant male patient who presents for/with See problem oriented charting ROS- No chest pain or shortness of breath. No headache or blurry vision.    Past Medical History-  Patient Active Problem List   Diagnosis Date Noted  . Atherosclerosis of coronary artery 02/09/2007    Priority: High  . Barrett's esophagus 02/09/2007    Priority: High  . Primary osteoarthritis of knee 03/21/2018    Priority: Medium  . Elevated PSA 08/03/2016    Priority: Medium  . Hyperlipidemia 02/09/2007    Priority: Medium  . Actinic keratosis 08/07/2014    Priority: Low  . Elevated blood pressure 03/29/2014    Priority: Low  . Hyperglycemia 02/26/2013    Priority: Low  . OBESITY 03/20/2010    Priority: Low  . Generalized anxiety disorder 08/22/2008    Priority: Low  . ERECTILE DYSFUNCTION 08/22/2008    Priority: Low  . Diverticulosis of large intestine 02/09/2007    Priority: Low  . Primary osteoarthritis of left knee 02/27/2018  . Benign prostatic hypertrophy (BPH) with nocturia 05/27/2016  . Lumbosacral radiculopathy 06/17/2015  . HLD (hyperlipidemia) 06/17/2015  . Numbness 06/17/2015  . Right leg numbness   . Bradycardia 04/14/2015  . Sinus bradycardia 03/19/2015  . Diverticulosis of large intestine with hemorrhage 03/19/2015  . Acute posthemorrhagic anemia 10/17/2014  . Diverticulosis of colon with hemorrhage   . Gastrointestinal hemorrhage 10/15/2014  . Hypertension 03/29/2014    Medications- reviewed and updated Current Outpatient Medications  Medication Sig Dispense Refill  . aspirin EC 81 MG tablet Take 1 tablet (81 mg total) by mouth 2 (two) times daily. For DVT prophylaxis (Patient taking differently: Take 81 mg by mouth daily. For DVT prophylaxis) 60 tablet 0  . omeprazole (PRILOSEC) 20 MG capsule TAKE 1 CAPSULE DAILY 90 capsule 3  . rosuvastatin (CRESTOR) 20 MG tablet Take 1 tablet (20 mg total) by mouth daily. 90 tablet  3  . tadalafil (ADCIRCA/CIALIS) 20 MG tablet TAKE ONE-HALF (1/2) TABLET DAILY AS NEEDED FOR ERECTILE DYSFUNCTION 8 tablet 18  . traMADol (ULTRAM) 50 MG tablet Take 1-2 tablets (50-100 mg total) by mouth every 6 (six) hours as needed for severe pain. Post-operatively 20 tablet 0   No current facility-administered medications for this visit.     Objective: BP 130/70   Pulse 61   Temp 97.8 F (36.6 C) (Oral)   Ht 5' 9.5" (1.765 m)   Wt 214 lb 12.8 oz (97.4 kg)   SpO2 94%   BMI 31.27 kg/m  Gen: NAD, resting comfortably CV: RRR no murmurs rubs or gallops Lungs: CTAB no crackles, wheeze, rhonchi Abdomen: soft/nontender/nondistended/normal bowel sounds. No rebound or guarding.  Ext: trace edema Skin: warm, dry Neuro: speech normal, moves all extremities Msk: scar from left knee surgery  Assessment/Plan:  Other notes: 1.Initial BP high- better on repeat  Hyperlipidemia S:  controlled on rosuvastatin 20 mg in the past  A/P: No recent lipid panel-we will update lipids  Hyperglycemia S: History of elevated A1c.  In 2016 was close to diabetes range at 6.4.  Weight has trended up about 10 pounds this year  Lab Results  Component Value Date   HGBA1C 5.9 12/19/2017   HGBA1C 5.8 07/28/2016   HGBA1C 6.4 (H) 04/15/2015   A/P: Update hemoglobin A1c  Primary osteoarthritis of knee S:Using 1 tramadol in AM and 1 in PM for his knee- they are considering cutting him down on this at  [facility]. History of knee replacement but still hurts on the left knee.  A/P: From avs "If [facility] wants [place] to take over the prescription for tramadol- have them write [place] a letter with summary of joint issue and how tramadol is being used as well as the request for [place] to prescribe/monitor medication. ". dont see xanax on med list- ideally would not want to have him on these medications together.   Elevated PSA Referred to urology last visit with elevated PSA.  PSA came back down in August and they will  follow him yearly for now per patient  Weight loss would also benefit his knees- BMI noted over 30  3-6 month with AWV advised at that time  Lab/Order associations: NOT fasting Hyperlipidemia, unspecified hyperlipidemia type - Plan: CBC, Comprehensive metabolic panel, Lipid panel  Hyperglycemia - Plan: Hemoglobin A1c  Primary osteoarthritis of left knee  Elevated PSA  BMI 31.0-31.9,adult  Need for prophylactic vaccination and inoculation against influenza - Plan: Flu vaccine HIGH DOSE PF  Return precautions advised.  [name], MD

## 2018-06-21 LAB — COMPREHENSIVE METABOLIC PANEL
ALT: 16 U/L (ref 0–53)
AST: 17 U/L (ref 0–37)
Albumin: 4 g/dL (ref 3.5–5.2)
Alkaline Phosphatase: 107 U/L (ref 39–117)
BILIRUBIN TOTAL: 0.5 mg/dL (ref 0.2–1.2)
BUN: 12 mg/dL (ref 6–23)
CO2: 25 meq/L (ref 19–32)
Calcium: 9.3 mg/dL (ref 8.4–10.5)
Chloride: 105 mEq/L (ref 96–112)
Creatinine, Ser: 0.92 mg/dL (ref 0.40–1.50)
GFR: 85.28 mL/min (ref >60.00)
GLUCOSE: 100 mg/dL — AB (ref 70–99)
POTASSIUM: 3.9 meq/L (ref 3.5–5.1)
SODIUM: 138 meq/L (ref 135–145)
Total Protein: 6.9 g/dL (ref 6.0–8.3)

## 2018-06-21 LAB — LIPID PANEL
CHOL/HDL RATIO: 3
Cholesterol: 107 mg/dL (ref 0–200)
HDL: 38.7 mg/dL — AB (ref >39.00)
LDL Cholesterol: 41 mg/dL (ref 0–99)
NONHDL: 68.63
Triglycerides: 137 mg/dL (ref 0.0–149.0)
VLDL: 27.4 mg/dL (ref 0.0–40.0)

## 2018-06-21 LAB — CBC
HCT: 44.3 % (ref 39.0–52.0)
HEMOGLOBIN: 15.2 g/dL (ref 13.0–17.0)
MCHC: 34.2 g/dL (ref 30.0–36.0)
MCV: 85.4 fl (ref 78.0–100.0)
Platelets: 206 10*3/uL (ref 150.0–400.0)
RBC: 5.18 Mil/uL (ref 4.22–5.81)
RDW: 13.9 % (ref 11.5–15.5)
WBC: 5.8 10*3/uL (ref 4.0–10.5)

## 2018-06-21 LAB — HEMOGLOBIN A1C: Hgb A1c MFr Bld: 5.6 % (ref 4.6–6.5)

## 2018-06-28 DIAGNOSIS — M25562 Pain in left knee: Secondary | ICD-10-CM | POA: Diagnosis not present

## 2018-07-07 ENCOUNTER — Telehealth: Payer: Self-pay | Admitting: *Deleted

## 2018-07-07 NOTE — Telephone Encounter (Signed)
Copied from [place] [phone]. Topic: General - Other >> Jul 07, 2018  2:12 PM Leward Quan A wrote: Reason for CRM: Patient called to say that [facility] called and told him they will fax a letter over to Dr Yong Channel that they will release the handling of  traMADol (ULTRAM) 50 MG tablet to Dr Yong Channel.

## 2018-07-10 NOTE — Telephone Encounter (Signed)
noted 

## 2018-07-11 NOTE — Telephone Encounter (Signed)
See note

## 2018-07-11 NOTE — Telephone Encounter (Signed)
Jose [name]- see thread- do we have a copy of the letter from ortho?  I reviewed PMP and has only been given tramadol since September by [facility] Montane of orthopedics. Also recently had hydrococodne from dentist. Prior had been getting oxycodone. I would not agree to rx oxycodone in future- would need to go back to orthopedics but willing to consider tramadol once we get the letter

## 2018-07-11 NOTE — Telephone Encounter (Signed)
[phone] 

## 2018-07-11 NOTE — Telephone Encounter (Signed)
Patient is calling to check on the status of this. He states he is needing a refill of the Tramadol 50mg . Please advise.  [id number] [address], [place] [address]  Phone: [phone] Fax: [phone]

## 2018-07-12 NOTE — Telephone Encounter (Signed)
Jose [name]- we need this letter - can you get a hold of it? Jose [name] is out tomorrow. Please tell patient we have not received this information yet to be able to prescribe

## 2018-07-12 NOTE — Telephone Encounter (Signed)
I have not seen a letter from Ortho office

## 2018-07-12 NOTE — Telephone Encounter (Signed)
See note

## 2018-07-12 NOTE — Telephone Encounter (Signed)
Pt calling to get an update to see if the letter has been received and what the plan will be. Please advise pt.

## 2018-07-13 ENCOUNTER — Telehealth: Payer: Self-pay | Admitting: Family Medicine

## 2018-07-13 MED ORDER — TRAMADOL HCL 50 MG PO TABS: 50.0000 mg | ORAL_TABLET | Freq: Two times a day (BID) | ORAL | 1 refills | Status: DC | PRN

## 2018-07-13 NOTE — Telephone Encounter (Signed)
Pt called in to follow up on status of medication refill, advised pt of the information below. He says he will call over to his ortho's office and verify that they have the correct fax number, in the mean time If we can obtain the letter from provider he would like a call back.

## 2018-07-13 NOTE — Telephone Encounter (Signed)
Received fax from [facility]- they are requesting we take over rx for tramadol. With that being said they also specifically mentioned weaning him off this medicine for the knee. I gave him enough for at least 2 months- whenever he runs out I want him to schedule an appointment (well he should call to schedule 2-3 weeks before he thinks he will run out) and we can determine next steps at that point.

## 2018-07-14 NOTE — Telephone Encounter (Signed)
Spoke with pt to inform. Pt has appt schedule for follow-up in Feb. Advised pt if he needs another RX to contact us and see if Dr Yong Channel wants him to come in prior to appt.

## 2018-07-17 NOTE — Telephone Encounter (Signed)
[facility], I too have been out since Wednesday. Has this been taken care of?

## 2018-07-26 DIAGNOSIS — M25561 Pain in right knee: Secondary | ICD-10-CM | POA: Diagnosis not present

## 2018-09-06 ENCOUNTER — Ambulatory Visit
Admission: EM | Admit: 2018-09-06 | Discharge: 2018-09-06 | Disposition: A | Discharge status: Home / Self Care | Payer: Medicare Other | Attending: Physician Assistant | Admitting: Physician Assistant

## 2018-09-06 DIAGNOSIS — H5789 Other specified disorders of eye and adnexa: Secondary | ICD-10-CM | POA: Insufficient documentation

## 2018-09-06 DIAGNOSIS — H04123 Dry eye syndrome of bilateral lacrimal glands: Secondary | ICD-10-CM | POA: Insufficient documentation

## 2018-09-06 MED ORDER — POLYETHYL GLYCOL-PROPYL GLYCOL 0.4-0.3 % OP GEL: 1.0000 "application " | Freq: Every evening | OPHTHALMIC | 0 refills | Status: DC | PRN

## 2018-09-06 NOTE — ED Triage Notes (Signed)
Pt c/o lt eye irritation since he woke up this am. Denies injury or any thing in it. States had bilateral cataract surgery last year

## 2018-09-06 NOTE — Discharge Instructions (Addendum)
Artificial tear gel at night. Warm compresses as directed. Monitor for any worsening of symptoms, changes in vision, sensitivity to light, eye swelling, painful eye movement, follow up with ophthalmology for further evaluation. Follow up with your eye doctor for monitoring of your eyes.

## 2018-09-06 NOTE — ED Provider Notes (Signed)
[name]    CSN: [id number] Arrival date & time: 09/06/18  1422     History   Chief Complaint Chief Complaint  Patient presents with  . Eye Pain    HPI Jose [name] is a 78 y.o. male.   78 year old male comes in for left eye irritation, foreign body sensation after waking up this morning.  He denies injury/trauma.  Denies redness, drainage, vision changes, photophobia.  Denies URI symptoms such as cough, congestion, sore throat.  States foreign body sensation is intermittent, no obvious aggravating or alleviating factor.  Had cataract surgery last year, and last follow-up with ophthalmology 1 year ago.     Past Medical History:  Diagnosis Date  . Acute lower GI bleeding 01/25/2013; 09/2014   diverticular bleed  . ANXIETY 08/22/2008  . Arthritis    "a little bit" (04/14/2015)  . Barrett's esophagus 2003   hematochezia as a result since 2003  . Basal cell cancer    removed from right forearm.   . Bradycardia 04/14/2015  . CORONARY ARTERY DISEASE    2 stents  . Depression   . DIVERTICULOSIS, COLON 2003  . ERECTILE DYSFUNCTION 08/22/2008  . GERD (gastroesophageal reflux disease)   . Heart murmur 04/14/2015   "they think I did; that's why I'm here" (04/14/2015)  . History of kidney stones   . HYPERLIPIDEMIA 02/09/2007  . Kidney stones    "blasted or passed" (04/14/2015)  . MI (myocardial infarction) ([place]) 2000  . Numbness    right side - history of   . OBESITY 03/20/2010  . Squamous cell cancer of skin of earlobe    removed from area behind right earlobe  . Stroke [facility])    4 yrs ago possible mini stroke -  tests never found anything    Patient Active Problem List   Diagnosis Date Noted  . Primary osteoarthritis of knee 03/21/2018  . Primary osteoarthritis of left knee 02/27/2018  . Elevated PSA 08/03/2016  . Benign prostatic hypertrophy (BPH) with nocturia 05/27/2016  . Lumbosacral radiculopathy 06/17/2015  . HLD (hyperlipidemia) 06/17/2015  .  Numbness 06/17/2015  . Right leg numbness   . Bradycardia 04/14/2015  . Sinus bradycardia 03/19/2015  . Diverticulosis of large intestine with hemorrhage 03/19/2015  . Acute posthemorrhagic anemia 10/17/2014  . Diverticulosis of colon with hemorrhage   . Gastrointestinal hemorrhage 10/15/2014  . Actinic keratosis 08/07/2014  . Elevated blood pressure 03/29/2014  . Hypertension 03/29/2014  . Hyperglycemia 02/26/2013  . OBESITY 03/20/2010  . Generalized anxiety disorder 08/22/2008  . ERECTILE DYSFUNCTION 08/22/2008  . Hyperlipidemia 02/09/2007  . Atherosclerosis of coronary artery 02/09/2007  . Barrett's esophagus 02/09/2007  . Diverticulosis of large intestine 02/09/2007    Past Surgical History:  Procedure Laterality Date  . BASAL CELL CARCINOMA EXCISION     posterior right ear.   Marland Kitchen CATARACT EXTRACTION, BILATERAL    . COLONOSCOPY N/A 01/26/2013   Procedure: COLONOSCOPY;  Surgeon: Milus Banister, MD;  Location: [facility];  Service: Endoscopy;  Laterality: N/A;  . CORONARY ANGIOPLASTY WITH STENT PLACEMENT  09/06/1998   LAD stent per Dr Elonda Husky in [place]. 2 stents.   . CYSTOSCOPY W/ URETERAL STENT PLACEMENT Left 12/21/2016   Procedure: CYSTOSCOPY WITH RETROGRADE PYELOGRAM/URETERAL STENT PLACEMENT;  Surgeon: Alexis Frock, MD;  Location: [facility];  Service: Urology;  Laterality: Left;  . ESOPHAGOGASTRODUODENOSCOPY (EGD) WITH ESOPHAGEAL DILATION     "q time I have an EGD; usually q 3-4 years" (04/14/2015)  . EXTRACORPOREAL SHOCK  WAVE LITHOTRIPSY Left 12/30/2016   Procedure: LEFT EXTRACORPOREAL SHOCK WAVE LITHOTRIPSY (ESWL);  Surgeon: Carolan Clines, MD;  Location: [facility];  Service: Urology;  Laterality: Left;  . LITHOTRIPSY  X 2  . SQUAMOUS CELL CARCINOMA EXCISION     right forearm  . TONSILLECTOMY  1971  . TOTAL KNEE ARTHROPLASTY Left 03/21/2018  . TOTAL KNEE ARTHROPLASTY Left 03/21/2018   Procedure: LEFT TOTAL KNEE ARTHROPLASTY;  Surgeon: Renette Butters, MD;  Location: [facility];  Service: Orthopedics;  Laterality: Left;  . UPPER GASTROINTESTINAL ENDOSCOPY    . URETERAL STENT PLACEMENT     2019       Home Medications    Prior to Admission medications   Medication Sig Start Date End Date Taking? Authorizing Provider  aspirin EC 81 MG tablet Take 1 tablet (81 mg total) by mouth 2 (two) times daily. For DVT prophylaxis Patient taking differently: Take 81 mg by mouth daily. For DVT prophylaxis 03/21/18   Prudencio Burly III, PA-C  omeprazole (PRILOSEC) 20 MG capsule TAKE 1 CAPSULE DAILY 12/19/17   Marin Olp, MD  Polyethyl Glycol-Propyl Glycol (SYSTANE) 0.4-0.3 % GEL ophthalmic gel Place 1 application into both eyes at bedtime as needed. 09/06/18   Tasia Catchings, Caniya Tagle V, PA-C  rosuvastatin (CRESTOR) 20 MG tablet Take 1 tablet (20 mg total) by mouth daily. 12/21/17   Marin Olp, MD  tadalafil (ADCIRCA/CIALIS) 20 MG tablet TAKE ONE-HALF (1/2) TABLET DAILY AS NEEDED FOR ERECTILE DYSFUNCTION 05/09/18   Marin Olp, MD  traMADol (ULTRAM) 50 MG tablet Take 1-2 tablets (50-100 mg total) by mouth 2 (two) times daily as needed for severe pain. Post-operatively 07/13/18   Marin Olp, MD    Family History Family History  Problem Relation Age of Onset  . Alcohol abuse Father   . Liver disease Father   . Heart disease Brother        CABG  . Colon cancer Neg Hx     Social History Social History   Tobacco Use  . Smoking status: Former Smoker    Packs/day: 1.00    Years: 30.00    Pack years: 30.00    Types: Cigarettes  . Smokeless tobacco: Never Used  . Tobacco comment: "quit smoking cigarettes in ~ 1990"  Substance Use Topics  . Alcohol use: Yes    Comment: 04/14/2015 "might drink a couple beers/month"  . Drug use: No     Allergies   Atorvastatin   Review of Systems Review of Systems  Reason unable to perform ROS: See HPI as above.     Physical Exam Triage Vital Signs ED Triage Vitals  Enc Vitals Group     BP 09/06/18 1435 (!)  167/88     Pulse Rate 09/06/18 1435 (!) 58     Resp 09/06/18 1435 16     Temp 09/06/18 1435 (!) 97.5 F (36.4 C)     Temp Source 09/06/18 1435 Oral     SpO2 09/06/18 1435 93 %     Weight --      Height --      Head Circumference --      Peak Flow --      Pain Score 09/06/18 1437 0     Pain Loc --      Pain Edu? --      Excl. in [place]? --    No data found.  Updated Vital Signs BP (!) 167/88 (BP Location: Left Arm)   Pulse Marland Kitchen)  58   Temp (!) 97.5 F (36.4 C) (Oral)   Resp 16   SpO2 93%   Visual Acuity Right Eye Distance: 20/30 Left Eye Distance: 20/30 Bilateral Distance: 20/20  Right Eye Near:   Left Eye Near:    Bilateral Near:     Physical Exam Constitutional:      General: He is not in acute distress.    Appearance: He is well-developed. He is not ill-appearing, toxic-appearing or diaphoretic.  HENT:     Head: Normocephalic and atraumatic.  Eyes:     General: Lids are normal. Lids are everted, no foreign bodies appreciated. Vision grossly intact.        Right eye: No foreign body, discharge or hordeolum.        Left eye: No foreign body, discharge or hordeolum.     Extraocular Movements: Extraocular movements intact.     Conjunctiva/sclera: Conjunctivae normal.     Right eye: Right conjunctiva is not injected.     Left eye: Left conjunctiva is not injected.     Pupils: Pupils are equal, round, and reactive to light.  Neurological:     Mental Status: He is alert and oriented to person, place, and time.      UC Treatments / Results  Labs (all labs ordered are listed, but only abnormal results are displayed) Labs Reviewed - No data to display  EKG None  Radiology No results found.  Procedures Procedures (including critical care time)  Medications Ordered in UC Medications - No data to display  Initial Impression / Assessment and Plan / UC Course  I have reviewed the triage vital signs and the nursing notes.  Pertinent labs & imaging results that  were available during my care of the patient were reviewed by me and considered in my medical decision making (see chart for details).    Discussed possible dry eyes causing symptoms.  Few drops of normal saline in the eye, with good relief of symptoms.  Will provide artificial tear gel.  Warm compress.  Return precautions given.  Patient expresses understanding and agrees to plan.  Final Clinical Impressions(s) / UC Diagnoses   Final diagnoses:  Eye irritation  Dry eyes    ED Prescriptions    Medication Sig Dispense Auth. Provider   Polyethyl Glycol-Propyl Glycol (SYSTANE) 0.4-0.3 % GEL ophthalmic gel Place 1 application into both eyes at bedtime as needed. 1 Bottle Tobin Chad, [place] 09/06/18 1525

## 2018-10-04 DIAGNOSIS — M25561 Pain in right knee: Secondary | ICD-10-CM | POA: Diagnosis not present

## 2018-10-23 ENCOUNTER — Ambulatory Visit (INDEPENDENT_AMBULATORY_CARE_PROVIDER_SITE_OTHER): Payer: Medicare Other | Admitting: Family Medicine

## 2018-10-23 ENCOUNTER — Encounter: Payer: Self-pay | Admitting: Family Medicine

## 2018-10-23 VITALS — BP 130/82 | HR 58 | Temp 97.7°F | Ht 69.5 in | Wt 217.8 lb

## 2018-10-23 DIAGNOSIS — K219 Gastro-esophageal reflux disease without esophagitis: Secondary | ICD-10-CM | POA: Diagnosis not present

## 2018-10-23 DIAGNOSIS — R739 Hyperglycemia, unspecified: Secondary | ICD-10-CM

## 2018-10-23 DIAGNOSIS — E785 Hyperlipidemia, unspecified: Secondary | ICD-10-CM

## 2018-10-23 DIAGNOSIS — R0981 Nasal congestion: Secondary | ICD-10-CM | POA: Diagnosis not present

## 2018-10-23 DIAGNOSIS — M1712 Unilateral primary osteoarthritis, left knee: Secondary | ICD-10-CM

## 2018-10-23 MED ORDER — TRAMADOL HCL 50 MG PO TABS: 50.0000 mg | ORAL_TABLET | Freq: Two times a day (BID) | ORAL | 1 refills | Status: DC | PRN

## 2018-10-23 NOTE — Assessment & Plan Note (Signed)
S: doing well on omeprazole 20mg  thoughWe did discuss throat clearing issues could be related. A/P: Reasonably well-controlled outside of phlegm issues/throat clearing- consider b12 with future labs

## 2018-10-23 NOTE — Assessment & Plan Note (Signed)
S:Only issue he gets is some phlegm clearing (possible GERD)- discussed could also be allergy related. Has seen Dr. Ernesto Rutherford in past and apparently has deviated septum. He does not want surgery A/P: mild poor control- we discussed trial neil med sinus rinse and discussed appropriate water source to use

## 2018-10-23 NOTE — Progress Notes (Signed)
Phone [phone]   Subjective:  Jose [name] is a 78 y.o. year old very pleasant male patient who presents for/with See problem oriented charting ROS- throat clearing issues/phlegm at times. No chest pain or shortness of breath. No headaches or blurry vision.    Past Medical History-  Patient Active Problem List   Diagnosis Date Noted  . Atherosclerosis of coronary artery 02/09/2007    Priority: High  . Barrett's esophagus 02/09/2007    Priority: High  . Primary osteoarthritis of knee 03/21/2018    Priority: Medium  . Primary osteoarthritis of left knee 02/27/2018    Priority: Medium  . Elevated PSA 08/03/2016    Priority: Medium  . Benign prostatic hypertrophy (BPH) with nocturia 05/27/2016    Priority: Medium  . Hyperlipidemia 02/09/2007    Priority: Medium  . Sinus bradycardia 03/19/2015    Priority: Low  . Diverticulosis of colon with hemorrhage     Priority: Low  . Actinic keratosis 08/07/2014    Priority: Low  . Elevated blood pressure 03/29/2014    Priority: Low  . Hyperglycemia 02/26/2013    Priority: Low  . OBESITY 03/20/2010    Priority: Low  . Generalized anxiety disorder 08/22/2008    Priority: Low  . ERECTILE DYSFUNCTION 08/22/2008    Priority: Low  . Diverticulosis of large intestine 02/09/2007    Priority: Low  . GERD (gastroesophageal reflux disease) 10/23/2018  . Nasal congestion 10/23/2018  . Lumbosacral radiculopathy 06/17/2015  . Numbness 06/17/2015  . Right leg numbness   . Acute posthemorrhagic anemia 10/17/2014  . Gastrointestinal hemorrhage 10/15/2014    Medications- reviewed and updated Current Outpatient Medications  Medication Sig Dispense Refill  . aspirin EC 81 MG tablet Take 1 tablet (81 mg total) by mouth 2 (two) times daily. For DVT prophylaxis (Patient taking differently: Take 81 mg by mouth daily. For DVT prophylaxis) 60 tablet 0  . omeprazole (PRILOSEC) 20 MG capsule TAKE 1 CAPSULE DAILY 90 capsule 3  . Polyethyl  Glycol-Propyl Glycol (SYSTANE) 0.4-0.3 % GEL ophthalmic gel Place 1 application into both eyes at bedtime as needed. 1 Bottle 0  . rosuvastatin (CRESTOR) 20 MG tablet Take 1 tablet (20 mg total) by mouth daily. 90 tablet 3  . tadalafil (ADCIRCA/CIALIS) 20 MG tablet TAKE ONE-HALF (1/2) TABLET DAILY AS NEEDED FOR ERECTILE DYSFUNCTION 8 tablet 18  . traMADol (ULTRAM) 50 MG tablet Take 1-2 tablets (50-100 mg total) by mouth 2 (two) times daily as needed for severe pain. Post-operatively 60 tablet 1   No current facility-administered medications for this visit.      Objective:  BP 130/82 (BP Location: Left Arm, Patient Position: Sitting, Cuff Size: Normal)   Pulse (!) 58   Temp 97.7 F (36.5 C) (Oral)   Ht 5' 9.5" (1.765 m)   Wt 217 lb 12.8 oz (98.8 kg)   SpO2 95%   BMI 31.70 kg/m  Gen: NAD, resting comfortably Mild nasal turbinate erythema and clear discharge CV: RRR no murmurs rubs or gallops Lungs: CTAB no crackles, wheeze, rhonchi Abdomen: soft/nontender initially- later notes mild tenderness sin lower abdomen with standing- states has history of possible hernia/nondistended/normal bowel sounds. No rebound or guarding.   Ext: no edema Skin: warm, dry    Assessment and Plan   #Hyperglycemia s: Diet controlled in the past- weight has been trending up since July though. He thinks pain pills at that time were cutting his appetite. Eats a lot of sweets A/P: encouraged weight loss- will delay a1c  until next visit and give him a chance to turn around weight gain.    #Hyperlipidemia  s: Well controlled on rosuvastatin 20 mg.  Ideal LDL under 70  A/P: Has been at goal-continue current medication.  Repeat lipids October 2020 or later   #Elevated PSA-following up with urology yearly  # states history possible hernia- none felt on exam today. Intermittent pain issues in lower abdomen but mild and not persistent.   Primary osteoarthritis of left knee S: History of left knee  replacement-with continued pain in the left knee despite this.   In past- patient using 1 tramadol in the morning and 1 in the evening for his knees.  We received a letter from her for winter requesting that we begin his long-term tramadol prescription.  At present, somedays he gets by- other days needs. Works 3 days a week - and states worse when he is working and being on feet more A/P: Pain is reasonably well controlled with intermittent tramadol-I refilled medication for him today. -PDMP reviewed and has received hydrocodone from dentist- since we took over tramadol no further rx from [facility]. Last refill was 08/04/18.   GERD (gastroesophageal reflux disease) S: doing well on omeprazole 20mg  thoughWe did discuss throat clearing issues could be related. A/P: Reasonably well-controlled outside of phlegm issues/throat clearing- consider b12 with future labs  Nasal congestion S:Only issue he gets is some phlegm clearing (possible GERD)- discussed could also be allergy related. Has seen Dr. Ernesto Rutherford in past and apparently has deviated septum. He does not want surgery A/P: mild poor control- we discussed trial neil med sinus rinse and discussed appropriate water source to use   Meds ordered this encounter  Medications  . traMADol (ULTRAM) 50 MG tablet    Sig: Take 1-2 tablets (50-100 mg total) by mouth 2 (two) times daily as needed for severe pain. Post-operatively    Dispense:  60 tablet    Refill:  1    Return precautions advised.  [name], MD

## 2018-10-23 NOTE — Patient Instructions (Addendum)
Health Maintenance Due  Topic Date Due  . TETANUS/TDAP Pt declined  08/30/2013   Try neil med sinus rinse  Going to let you skip labs today and try next visit  4-6 month follow up

## 2018-10-23 NOTE — Assessment & Plan Note (Signed)
S: History of left knee replacement-with continued pain in the left knee despite this.   In past- patient using 1 tramadol in the morning and 1 in the evening for his knees.  We received a letter from her for winter requesting that we begin his long-term tramadol prescription.  At present, somedays he gets by- other days needs. Works 3 days a week - and states worse when he is working and being on feet more A/P: Pain is reasonably well controlled with intermittent tramadol-I refilled medication for him today. -PDMP reviewed and has received hydrocodone from dentist- since we took over tramadol no further rx from [facility]. Last refill was 08/04/18.

## 2018-12-27 ENCOUNTER — Other Ambulatory Visit: Payer: Self-pay | Admitting: Family Medicine

## 2018-12-28 DIAGNOSIS — E785 Hyperlipidemia, unspecified: Secondary | ICD-10-CM | POA: Diagnosis not present

## 2018-12-28 DIAGNOSIS — Z955 Presence of coronary angioplasty implant and graft: Secondary | ICD-10-CM | POA: Insufficient documentation

## 2018-12-28 DIAGNOSIS — I10 Essential (primary) hypertension: Secondary | ICD-10-CM | POA: Diagnosis not present

## 2018-12-28 DIAGNOSIS — I2510 Atherosclerotic heart disease of native coronary artery without angina pectoris: Secondary | ICD-10-CM | POA: Diagnosis not present

## 2019-01-02 ENCOUNTER — Other Ambulatory Visit: Payer: Self-pay | Admitting: Family Medicine

## 2019-01-03 ENCOUNTER — Telehealth: Payer: Self-pay | Admitting: Family Medicine

## 2019-01-03 ENCOUNTER — Other Ambulatory Visit: Payer: Self-pay

## 2019-01-03 MED ORDER — TADALAFIL 20 MG PO TABS: ORAL_TABLET | ORAL | 18 refills | Status: DC

## 2019-01-03 NOTE — Telephone Encounter (Signed)
Rx filled today 

## 2019-01-03 NOTE — Telephone Encounter (Signed)
See note

## 2019-01-03 NOTE — Telephone Encounter (Signed)
Copied from [place] [id number]. Topic: Quick Communication - Rx Refill/Question >> Jan 03, 2019  1:04 PM Reyne Dumas L wrote: Medication: tadalafil (ADCIRCA/CIALIS) 20 MG tablet  Has the patient contacted their pharmacy? Yes - needs new script (Agent: If no, request that the patient contact the pharmacy for the refill.) (Agent: If yes, when and what did the pharmacy advise?)  Preferred Pharmacy (with phone number or street name): [facility], [place] [phone] (Phone) [phone] (Fax)  Agent: Please be advised that RX refills may take up to 3 business days. We ask that you follow-up with your pharmacy.

## 2019-01-03 NOTE — Telephone Encounter (Signed)
Rx refill Tadalafil 20mg  Last ov 10/23/18 Last fill 05/09/18  #8/18 Next ov 02/19/19

## 2019-02-05 ENCOUNTER — Ambulatory Visit (INDEPENDENT_AMBULATORY_CARE_PROVIDER_SITE_OTHER): Payer: Medicare Other | Admitting: Family Medicine

## 2019-02-05 ENCOUNTER — Encounter: Payer: Self-pay | Admitting: Family Medicine

## 2019-02-05 DIAGNOSIS — R195 Other fecal abnormalities: Secondary | ICD-10-CM | POA: Diagnosis not present

## 2019-02-05 DIAGNOSIS — Z7189 Other specified counseling: Secondary | ICD-10-CM | POA: Diagnosis not present

## 2019-02-05 NOTE — Progress Notes (Signed)
Phone [phone]   Subjective:  Virtual visit via phonenote Chief Complaint  Patient presents with  . Diarrhea   This visit type was conducted due to national recommendations for restrictions regarding the COVID-19 Pandemic (e.g. social distancing).  This format is felt to be most appropriate for this patient at this time balancing risks to patient and risks to population by having him in for in person visit.  All issues noted in this document were discussed and addressed.  No physical exam was performed (except for noted visual exam or audio findings with Telehealth visits).  The patient has consented to conduct a Telehealth visit and understands insurance will be billed.   Our team/I connected with [name] at  3:20 PM EDT by phone (patient did not have equipment for webex) and verified that I am speaking with the correct person using two identifiers.  Location patient: [facility] Location provider: [facility], office Persons participating in the virtual visit:  patient  Time on phone: 12 minutes Counseling provided about covid 19 symptoms and risks  Our team/I discussed the limitations of evaluation and management by telemedicine and the availability of in person appointments. In light of current covid-19 pandemic, patient also understands that we are trying to protect them by minimizing in office contact if at all possible.  The patient expressed consent for telemedicine visit and agreed to proceed. Patient understands insurance will be billed.   ROS- No fever, chills, cough, shortness of breath, body aches, new sore throat (sometimes gets some drainage in AM but has been ongoing issue), or loss of taste or smell  Past Medical History-  Patient Active Problem List   Diagnosis Date Noted  . Atherosclerosis of coronary artery 02/09/2007    Priority: High  . Barrett's esophagus 02/09/2007    Priority: High  . Primary osteoarthritis of knee 03/21/2018    Priority: Medium  .  Primary osteoarthritis of left knee 02/27/2018    Priority: Medium  . Elevated PSA 08/03/2016    Priority: Medium  . Benign prostatic hypertrophy (BPH) with nocturia 05/27/2016    Priority: Medium  . Hyperlipidemia 02/09/2007    Priority: Medium  . Sinus bradycardia 03/19/2015    Priority: Low  . Diverticulosis of colon with hemorrhage     Priority: Low  . Actinic keratosis 08/07/2014    Priority: Low  . Elevated blood pressure 03/29/2014    Priority: Low  . Hyperglycemia 02/26/2013    Priority: Low  . OBESITY 03/20/2010    Priority: Low  . Generalized anxiety disorder 08/22/2008    Priority: Low  . ERECTILE DYSFUNCTION 08/22/2008    Priority: Low  . Diverticulosis of large intestine 02/09/2007    Priority: Low  . GERD (gastroesophageal reflux disease) 10/23/2018  . Nasal congestion 10/23/2018  . Lumbosacral radiculopathy 06/17/2015  . Numbness 06/17/2015  . Right leg numbness   . Acute posthemorrhagic anemia 10/17/2014  . Gastrointestinal hemorrhage 10/15/2014    Medications- reviewed and updated Current Outpatient Medications  Medication Sig Dispense Refill  . aspirin EC 81 MG tablet Take 1 tablet (81 mg total) by mouth 2 (two) times daily. For DVT prophylaxis (Patient taking differently: Take 81 mg by mouth daily. For DVT prophylaxis) 60 tablet 0  . omeprazole (PRILOSEC) 20 MG capsule TAKE 1 CAPSULE DAILY 90 capsule 3  . Polyethyl Glycol-Propyl Glycol (SYSTANE) 0.4-0.3 % GEL ophthalmic gel Place 1 application into both eyes at bedtime as needed. 1 Bottle 0  . rosuvastatin (CRESTOR) 20 MG tablet  TAKE 1 TABLET DAILY 90 tablet 3  . tadalafil (ADCIRCA/CIALIS) 20 MG tablet TAKE ONE-HALF (1/2) TABLET DAILY AS NEEDED FOR ERECTILE DYSFUNCTION 8 tablet 18  . traMADol (ULTRAM) 50 MG tablet Take 1-2 tablets (50-100 mg total) by mouth 2 (two) times daily as needed for severe pain. Post-operatively 60 tablet 1   No current facility-administered medications for this visit.       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 He was in the store before it was shut down. [employer] Shut the store down for several hours and opened it back up. He tells me when he was at register they had plexi glass separating him and worker and worker was wearing mask.   Patient has not had any covid 19 symptoms- no cough, runny nose, fever per patient. This morning he did have some slightly loose bowel movements but that seems to have resolved.  A/P: I think overall risk of covid 19 is low. Had 2 mildly loose stools but unless he develops other symptoms we opted to not proceed with testing. He is going to check his temperature and call back- if was elevated 99.5 or above would likely test.    Future Appointments  Date Time Provider [place]  02/05/2019  3:20 PM Marin Olp, MD [facility]  02/19/2019  3:40 PM Marin Olp, MD [facility]   Lab/Order associations: Loose stools  Educated About Covid-19 Virus Infection  Return precautions advised.  [name], MD

## 2019-02-05 NOTE — Patient Instructions (Signed)
There are no preventive care reminders to display for this patient.  Depression screen [facility] 2/9 10/23/2018 12/19/2017 08/03/2016  Decreased Interest 0 0 0  Down, Depressed, Hopeless 0 0 0  PHQ - 2 Score 0 0 0

## 2019-02-19 ENCOUNTER — Encounter: Payer: Self-pay | Admitting: Family Medicine

## 2019-02-19 ENCOUNTER — Other Ambulatory Visit: Payer: Self-pay

## 2019-02-19 ENCOUNTER — Ambulatory Visit (INDEPENDENT_AMBULATORY_CARE_PROVIDER_SITE_OTHER): Payer: Medicare Other | Admitting: Family Medicine

## 2019-02-19 VITALS — BP 132/86 | HR 64 | Temp 98.4°F | Resp 18 | Ht 69.5 in | Wt 220.4 lb

## 2019-02-19 DIAGNOSIS — K219 Gastro-esophageal reflux disease without esophagitis: Secondary | ICD-10-CM

## 2019-02-19 DIAGNOSIS — K2270 Barrett's esophagus without dysplasia: Secondary | ICD-10-CM

## 2019-02-19 DIAGNOSIS — R609 Edema, unspecified: Secondary | ICD-10-CM | POA: Diagnosis not present

## 2019-02-19 DIAGNOSIS — E669 Obesity, unspecified: Secondary | ICD-10-CM | POA: Diagnosis not present

## 2019-02-19 DIAGNOSIS — I2510 Atherosclerotic heart disease of native coronary artery without angina pectoris: Secondary | ICD-10-CM

## 2019-02-19 DIAGNOSIS — Z79899 Other long term (current) drug therapy: Secondary | ICD-10-CM | POA: Diagnosis not present

## 2019-02-19 DIAGNOSIS — R739 Hyperglycemia, unspecified: Secondary | ICD-10-CM | POA: Diagnosis not present

## 2019-02-19 MED ORDER — TRAMADOL HCL 50 MG PO TABS: 50.0000 mg | ORAL_TABLET | Freq: Two times a day (BID) | ORAL | 1 refills | Status: DC | PRN

## 2019-02-19 NOTE — Patient Instructions (Addendum)
Please stop by lab before you go If you do not have mychart- we will call you about results within 5 business days of [place] receiving them.  If you have mychart- we will send your results within 3 business days of [place] receiving them.  If abnormal or we want to clarify a result, we will call or mychart you to make sure you receive the message.  If you have questions or concerns or don't hear within 5-7 days, please send [place] a message or call us.   No changes in medicines today

## 2019-02-19 NOTE — Progress Notes (Signed)
Phone [phone]   Subjective:  Jose [name] is a 78 y.o. year old very pleasant male patient who presents for/with See problem oriented charting Chief Complaint  Patient presents with  . Hyperlipidemia  . Gastroesophageal Reflux   ROS- some edema. No chest pain or shortness of breath reported. No fever or chills or cough reported.    Past Medical History-  Patient Active Problem List   Diagnosis Date Noted  . Atherosclerosis of coronary artery 02/09/2007    Priority: High  . Barrett's esophagus 02/09/2007    Priority: High  . Primary osteoarthritis of knee 03/21/2018    Priority: Medium  . Primary osteoarthritis of left knee 02/27/2018    Priority: Medium  . Elevated PSA 08/03/2016    Priority: Medium  . Benign prostatic hypertrophy (BPH) with nocturia 05/27/2016    Priority: Medium  . Hyperlipidemia 02/09/2007    Priority: Medium  . Sinus bradycardia 03/19/2015    Priority: Low  . Diverticulosis of colon with hemorrhage     Priority: Low  . Actinic keratosis 08/07/2014    Priority: Low  . Elevated blood pressure 03/29/2014    Priority: Low  . Hyperglycemia 02/26/2013    Priority: Low  . OBESITY 03/20/2010    Priority: Low  . Generalized anxiety disorder 08/22/2008    Priority: Low  . ERECTILE DYSFUNCTION 08/22/2008    Priority: Low  . Diverticulosis of large intestine 02/09/2007    Priority: Low  . GERD (gastroesophageal reflux disease) 10/23/2018  . Nasal congestion 10/23/2018  . Lumbosacral radiculopathy 06/17/2015  . Numbness 06/17/2015  . Right leg numbness   . Acute posthemorrhagic anemia 10/17/2014  . Gastrointestinal hemorrhage 10/15/2014    Medications- reviewed and updated Current Outpatient Medications  Medication Sig Dispense Refill  . aspirin EC 81 MG tablet Take 1 tablet (81 mg total) by mouth 2 (two) times daily. For DVT prophylaxis (Patient taking differently: Take 81 mg by mouth daily. For DVT prophylaxis) 60 tablet 0  . omeprazole  (PRILOSEC) 20 MG capsule TAKE 1 CAPSULE DAILY 90 capsule 3  . Polyethyl Glycol-Propyl Glycol (SYSTANE) 0.4-0.3 % GEL ophthalmic gel Place 1 application into both eyes at bedtime as needed. 1 Bottle 0  . rosuvastatin (CRESTOR) 20 MG tablet TAKE 1 TABLET DAILY 90 tablet 3  . tadalafil (ADCIRCA/CIALIS) 20 MG tablet TAKE ONE-HALF (1/2) TABLET DAILY AS NEEDED FOR ERECTILE DYSFUNCTION 8 tablet 18  . traMADol (ULTRAM) 50 MG tablet Take 1-2 tablets (50-100 mg total) by mouth 2 (two) times daily as needed for severe pain. Post-operatively 60 tablet 1   No current facility-administered medications for this visit.      Objective:  BP 132/86   Pulse 64   Temp 98.4 F (36.9 C) (Oral)   Resp 18   Ht 5' 9.5" (1.765 m)   Wt 220 lb 6.4 oz (100 kg)   SpO2 94%   BMI 32.08 kg/m  Gen: NAD, resting comfortably CV: RRR no murmurs rubs or gallops Lungs: CTAB no crackles, wheeze, rhonchi Abdomen: soft/nontender/nondistended/normal bowel sounds. No rebound or guarding.  Ext: 1+ edema Skin: warm, dry Neuro: grossly normal, moves all extremities     Assessment and Plan   #Prior diarrhea cleared up from a few weeks ago with just one day. Was afebrile.   # GERD/barretts esophagus S:  Patient compliant with omeprazole 20mg - one of symptoms has been throat clearignt. Also gets nasal congestion but has deviated septum and has not wanted surgery from Dr. Ernesto Rutherford visit in  past A/P: Stable. Continue current medications. Check b12 with long term PPI- he does take a b12 supplement -Appears to be on 2022 recall for next EGD with Dr. Fuller Plan  # Obesity/hyperglycemia/prediabetes S:goal for hyperglycemia/prediabetes has been weight loss but from last visit weight appears to be trending up. Using sugar substitute stevia.  Even though prior CBGs have been elevated last A1c looked good though. Wt Readings from Last 3 Encounters:  02/19/19 220 lb 6.4 oz (100 kg)  10/23/18 217 lb 12.8 oz (98.8 kg)  06/20/18 214 lb 12.8  oz (97.4 kg)  A/P: Unfortunately patient has been gaining weight which increases prediabetes risk- we will get updated A1c today.  For obesity- Encouraged need for healthy eating, regular exercise, weight loss.    #hyperlipidemia/CAD S:  controlled on rosuvastatin 20mg . Also taking aspirin for cad.  Reports recent cardiology visit-appears through care everywhere was seen April 2020 on [facility] Lab Results  Component Value Date   CHOL 107 06/20/2018   HDL 38.70 (L) 06/20/2018   LDLCALC 41 06/20/2018   LDLDIRECT 100.0 12/19/2017   TRIG 137.0 06/20/2018   CHOLHDL 3 06/20/2018   A/P: Stable x2- continue current medication cardiology follow-up  # Edema S:worse since knee replacement on the left July 2019- worse on the left leg. Has mild right knee issues  A/P: will update labs to make sure no causes outside venous insuffiencey (Though not ruling out diastolic CHF).  I suspect some venous insufficiency accompanied by prior injuries  #goes to sleep with tv on and poor sleep patterns- advised no tv hour before bed and to use something else for noise instead of tv.   #Using 60 tramadol in about 2 months-I did agree to refill-prior prescribed by [facility] but they requested transition to primary care.  [place] reviewed  Patient strongly requests 41-month follow-up Future Appointments  Date Time Provider [place]  06/04/2019  4:20 PM Marin Olp, MD [facility]   Lab/Order associations:   ICD-10-CM   1. Obesity (BMI 30-39.9)  E66.9   2. Hyperglycemia  R73.9 Hemoglobin A1c    Hemoglobin A1c  3. High risk medication use  Z79.899 Vitamin B12    Vitamin B12  4. Gastroesophageal reflux disease without esophagitis  K21.9 CBC    Comprehensive metabolic panel    Comprehensive metabolic panel    CBC  5. Edema, unspecified type  R60.9 TSH    TSH  6. Barrett's esophagus without dysplasia  K22.70   7. Atherosclerosis of native coronary artery of native heart without angina pectoris   I25.10     Meds ordered this encounter  Medications  . traMADol (ULTRAM) 50 MG tablet    Sig: Take 1-2 tablets (50-100 mg total) by mouth 2 (two) times daily as needed for severe pain. Post-operatively    Dispense:  60 tablet    Refill:  1    Return precautions advised.  [name], MD

## 2019-02-20 LAB — CBC
HCT: 45.3 % (ref 39.0–52.0)
Hemoglobin: 15.2 g/dL (ref 13.0–17.0)
MCHC: 33.6 g/dL (ref 30.0–36.0)
MCV: 87.2 fl (ref 78.0–100.0)
Platelets: 167 10*3/uL (ref 150.0–400.0)
RBC: 5.2 Mil/uL (ref 4.22–5.81)
RDW: 14.4 % (ref 11.5–15.5)
WBC: 6.7 10*3/uL (ref 4.0–10.5)

## 2019-02-20 LAB — VITAMIN B12: Vitamin B-12: 793 pg/mL (ref 211–911)

## 2019-02-20 LAB — COMPREHENSIVE METABOLIC PANEL
ALT: 16 U/L (ref 0–53)
AST: 14 U/L (ref 0–37)
Albumin: 4.1 g/dL (ref 3.5–5.2)
Alkaline Phosphatase: 88 U/L (ref 39–117)
BUN: 15 mg/dL (ref 6–23)
CO2: 25 mEq/L (ref 19–32)
Calcium: 9.1 mg/dL (ref 8.4–10.5)
Chloride: 108 mEq/L (ref 96–112)
Creatinine, Ser: 0.98 mg/dL (ref 0.40–1.50)
GFR: 74.46 mL/min (ref >60.00)
Glucose, Bld: 80 mg/dL (ref 70–99)
Potassium: 4 mEq/L (ref 3.5–5.1)
Sodium: 140 mEq/L (ref 135–145)
Total Bilirubin: 0.7 mg/dL (ref 0.2–1.2)
Total Protein: 6.3 g/dL (ref 6.0–8.3)

## 2019-02-20 LAB — HEMOGLOBIN A1C: Hgb A1c MFr Bld: 5.8 % (ref 4.6–6.5)

## 2019-02-20 LAB — TSH: TSH: 1.55 u[IU]/mL (ref 0.35–4.50)

## 2019-02-21 DIAGNOSIS — N410 Acute prostatitis: Secondary | ICD-10-CM

## 2019-02-21 DIAGNOSIS — N401 Enlarged prostate with lower urinary tract symptoms: Secondary | ICD-10-CM | POA: Diagnosis not present

## 2019-02-21 HISTORY — DX: Acute prostatitis: N41.0

## 2019-02-22 ENCOUNTER — Telehealth: Payer: Self-pay | Admitting: Hematology

## 2019-02-22 NOTE — Telephone Encounter (Signed)
Patient called in for lab results. / These were not in the [facility] basket, however, I did provide results to the patient. / Notes recorded by Marin Olp, MD on 02/20/2019 at 8:15 PM EDT  Your CBC was normal (blood counts, infection fighting cells, platelets).  Your CMET was normal (kidney, liver, and electrolytes, blood sugar).  Your thyroid was normal.  At risk for diabetes with hemoglobin a1c of 5.8 up from 5.6 last visit (at risk from 5.7-6.4). Healthy eating, regular exercise, weight loss advised. Above 6 we could consider metformin to help prevent diabetes.  B12 levels are normal  / Patient verbalized understanding.

## 2019-02-26 ENCOUNTER — Telehealth: Payer: Self-pay | Admitting: *Deleted

## 2019-02-26 ENCOUNTER — Other Ambulatory Visit: Payer: Self-pay

## 2019-02-26 ENCOUNTER — Ambulatory Visit (INDEPENDENT_AMBULATORY_CARE_PROVIDER_SITE_OTHER): Payer: Medicare Other | Admitting: Family Medicine

## 2019-02-26 ENCOUNTER — Ambulatory Visit: Payer: Self-pay | Admitting: *Deleted

## 2019-02-26 ENCOUNTER — Encounter: Payer: Self-pay | Admitting: Family Medicine

## 2019-02-26 DIAGNOSIS — R6889 Other general symptoms and signs: Secondary | ICD-10-CM

## 2019-02-26 DIAGNOSIS — Z20822 Contact with and (suspected) exposure to covid-19: Secondary | ICD-10-CM

## 2019-02-26 DIAGNOSIS — N390 Urinary tract infection, site not specified: Secondary | ICD-10-CM

## 2019-02-26 DIAGNOSIS — I2510 Atherosclerotic heart disease of native coronary artery without angina pectoris: Secondary | ICD-10-CM

## 2019-02-26 NOTE — Telephone Encounter (Signed)
FYI, please see below

## 2019-02-26 NOTE — Telephone Encounter (Signed)
Thanks for the update-I appreciate Dr. Sarajane Jews seeing him today

## 2019-02-26 NOTE — Progress Notes (Signed)
   Subjective:    Patient ID: Jose [name], male    DOB: 02-24-1941, 78 y.o.   MRN: [id number]  HPI Virtual Visit via Telephone Note  I connected with the patient on 02/26/19 at 10:45 AM EDT by telephone and verified that I am speaking with the correct person using two identifiers. We attempted to connect virtually but we had technical difficulties with the audio and video.     I discussed the limitations, risks, security and privacy concerns of performing an evaluation and management service by telephone and the availability of in person appointments. I also discussed with the patient that there may be a patient responsible charge related to this service. The patient expressed understanding and agreed to proceed.  Location patient: home Location provider: work or home office Participants present for the call: patient, provider Patient did not have a visit in the prior 7 days to address this/these issue(s).   History of Present Illness: Here for several issues. First he started having urinary urgency and burning 5 days ago so he went to [facility]. He was found to have a UTI, and he was started on Bactrim. Then the following day he developed a fever that went as high as 102 degrees. The fever hung around 100-101 all weekend, until this morning the fever seems to be gone. He also has a dry cough, though he denies any SOB or chest pain. No headache but he does have body aches. He has diarrhea but no nausea or vomiting. He was contacted by Urology this morning and the urine culture was  Back. They told him to stop the Bactrim, and they have called in a new antibiotic for him to take. He has not picked this up yet and he does not know the name of it. He is concerned that he may have the Covid-19 virus as well.    Observations/Objective: Patient sounds cheerful and well on the phone. I do not appreciate any SOB. Speech and thought processing are grossly intact. Patient reported vitals:   Assessment and Plan: He definitely he has a UTI. And I encouraged him to start taking the new antibiotic that Urology has called in for him. He should drink plenty of fluids. Otherwise he may have the Covid-19 virus as well, so we will arrange for him to be tested at the [facility] site today. He will stay out of work and will quarantine at home until the results are back.  Alysia Penna, MD   Follow Up Instructions:     [phone] 5-10 [phone] 11-20 [phone] 21-30 I did not refer this patient for an OV in the next 24 hours for this/these issue(s).  I discussed the assessment and treatment plan with the patient. The patient was provided an opportunity to ask questions and all were answered. The patient agreed with the plan and demonstrated an understanding of the instructions.   The patient was advised to call back or seek an in-person evaluation if the symptoms worsen or if the condition fails to improve as anticipated.  I provided 16 minutes of non-face-to-face time during this encounter.   Alysia Penna, MD    Review of Systems     Objective:   Physical Exam        Assessment & Plan:

## 2019-02-26 NOTE — Telephone Encounter (Signed)
Pt called with complaints of fever over the weekend (101.0-102.10); he also complains of loss of taste; the pt went to [facility] and was given an antibiotic (Septra); he would like a COVID test for piece of mind; he uses masks and social distances; recommendations made per nurse triage protocol; he verbalized understanding; the pt normally sees Dr Yong Channel, [place]; pt transferred to [facility] for scheduling.  Reason for Disposition . HIGH RISK patient (e.g., age > 79 years, diabetes, heart or lung disease, weak immune system)  Answer Assessment - Initial Assessment Questions 1. CLOSE CONTACT: "Who is the person with the confirmed or suspected COVID-19 infection that you were exposed to?"     Not sure; community spread 2. PLACE of CONTACT: "Where were you when you were exposed to COVID-19?" (e.g., home, school, medical waiting room; which city?)     [place], [place] 3. TYPE of CONTACT: "How much contact was there?" (e.g., sitting next to, live in same house, work in same office, same building)     Same building 4. DURATION of CONTACT: "How long were you in contact with the COVID-19 patient?" (e.g., a few seconds, passed by person, a few minutes, live with the patient)     Few minutes 5. DATE of CONTACT: "When did you have contact with a COVID-19 patient?" (e.g., how many days ago)     02/26/2019 6. TRAVEL: "Have you traveled out of the country recently?" If so, "When and where?"     * Also ask about out-of-state travel, since the CDC has identified some high-risk cities for community spread in the [place].     * Note: Travel becomes less relevant if there is widespread community transmission where the patient lives.    no 7. COMMUNITY SPREAD: "Are there lots of cases of COVID-19 (community spread) where you live?" (See public health department website, if unsure)       Major community spread 8. SYMPTOMS: "Do you have any symptoms?" (e.g., fever, cough, breathing difficulty)     fever 9. PREGNANCY  OR POSTPARTUM: "Is there any chance you are pregnant?" "When was your last menstrual period?" "Did you deliver in the last 2 weeks?"     n/a 10. HIGH RISK: "Do you have any heart or lung problems? Do you have a weak immune system?" (e.g., CHF, COPD, asthma, HIV positive, chemotherapy, renal failure, diabetes mellitus, sickle cell anemia)      MI, obese  Protocols used: CORONAVIRUS (COVID-19) DIAGNOSED OR SUSPECTED-A-AH, CORONAVIRUS (COVID-19) EXPOSURE-A-AH

## 2019-02-26 NOTE — Telephone Encounter (Signed)
-----   Message from Elie Confer, [place] sent at 02/26/2019 12:23 PM EDT ----- Good afternoon!!!  Dr. Sarajane Jews would like for the pt to be covid tested.  Pt would like to have this done at the [facility] facility please.  Fever over the weekend---101-102, diarrhea.  Thank you.

## 2019-02-26 NOTE — Telephone Encounter (Signed)
Patient scheduled today for covid testing @ 1:30 @  [facility]. Instructions given and order placed.

## 2019-02-27 ENCOUNTER — Encounter: Payer: Self-pay | Admitting: Family Medicine

## 2019-02-27 ENCOUNTER — Encounter: Payer: Self-pay | Admitting: Physical Therapy

## 2019-03-01 LAB — NOVEL CORONAVIRUS, NAA: SARS-CoV-2, NAA: NOT DETECTED

## 2019-03-08 DIAGNOSIS — R972 Elevated prostate specific antigen [PSA]: Secondary | ICD-10-CM | POA: Diagnosis not present

## 2019-03-08 DIAGNOSIS — N410 Acute prostatitis: Secondary | ICD-10-CM | POA: Diagnosis not present

## 2019-04-04 ENCOUNTER — Encounter: Payer: Self-pay | Admitting: Gastroenterology

## 2019-04-04 ENCOUNTER — Ambulatory Visit (INDEPENDENT_AMBULATORY_CARE_PROVIDER_SITE_OTHER): Payer: Medicare Other | Admitting: Gastroenterology

## 2019-04-04 ENCOUNTER — Other Ambulatory Visit (INDEPENDENT_AMBULATORY_CARE_PROVIDER_SITE_OTHER): Payer: Medicare Other

## 2019-04-04 VITALS — BP 152/80 | HR 48 | Temp 97.6°F | Ht 66.0 in | Wt 215.2 lb

## 2019-04-04 DIAGNOSIS — R1030 Lower abdominal pain, unspecified: Secondary | ICD-10-CM

## 2019-04-04 DIAGNOSIS — K2270 Barrett's esophagus without dysplasia: Secondary | ICD-10-CM | POA: Diagnosis not present

## 2019-04-04 DIAGNOSIS — M25562 Pain in left knee: Secondary | ICD-10-CM | POA: Diagnosis not present

## 2019-04-04 DIAGNOSIS — I2510 Atherosclerotic heart disease of native coronary artery without angina pectoris: Secondary | ICD-10-CM

## 2019-04-04 LAB — CREATININE, SERUM: Creatinine, Ser: 0.89 mg/dL (ref 0.40–1.50)

## 2019-04-04 LAB — LIPASE: Lipase: 28 U/L (ref 11.0–59.0)

## 2019-04-04 LAB — BUN: BUN: 11 mg/dL (ref 6–23)

## 2019-04-04 MED ORDER — OMEPRAZOLE 40 MG PO CPDR: 40.0000 mg | DELAYED_RELEASE_CAPSULE | Freq: Every day | ORAL | 3 refills | Status: DC

## 2019-04-04 NOTE — Progress Notes (Signed)
    History of Present Illness: This is a 78 year old male who relates problems with intermittent lower abdominal pain primarily with movement and direct pressure for several months.  He denies abdominal pain associated with meals or bowel movements.  He notes increased phlegm in his throat.  His heartburn, regurgitation have been under good control however he does occasionally have breakthrough symptoms and does note significant reflux symptoms when he misses a day of omeprazole or takes his daily dose later than normal. Denies weight loss, constipation, diarrhea, change in stool caliber, melena, hematochezia, nausea, vomiting, dysphagia, chest pain. .  EGD 09/2017 - Esophageal mucosal changes secondary to established long-segment Barrett's disease. Biopsied. - No endoscopic esophageal abnormality to explain patient's dysphagia. Esophagus dilated. Dilated. - Small hiatal hernia. - Normal duodenal bulb and second portion of the duodenum.  Colon 12/2012 L > R colon diverticulosis   Current Medications, Allergies, Past Medical History, Past Surgical History, Family History and Social History were reviewed in [employer] record.   Physical Exam: General: Well developed, well nourished, no acute distress Head: Normocephalic and atraumatic Eyes:  sclerae anicteric, EOMI Ears: Normal auditory acuity Mouth: No deformity or lesions Lungs: Clear throughout to auscultation Heart: Regular rate and rhythm; no murmurs, rubs or bruits Abdomen: Soft, large, mild lower abdominal tenderness and non distended. No masses, hepatosplenomegaly or hernias noted. Normal Bowel sounds Rectal: Not done Musculoskeletal: Symmetrical with no gross deformities  Pulses:  Normal pulses noted Extremities: No clubbing, cyanosis, edema or deformities noted Neurological: Alert oriented x 4, grossly nonfocal Psychological:  Alert and cooperative. Normal mood and affect   Assessment and  Recommendations:  1. Barrett's esophagus, long segment without dysplasia.  Increase omeprazole to 40 mg daily.  Closely follow antireflux measures.  A 3-year interval surveillance endoscopy is recommended in February 2022.  2. Lower abdominal pain and tenderness.  Etiology unclear.  Obtain lipase.  Schedule abdominal pelvic CT.  After CT reviewed consider a trial of an antispasmodic.  REV in 6 weeks.   3. Diverticulosis.  High-fiber diet with adequate daily water intake long-term.  4.  Increased phlegm.  Very unlikely to be GI related however will assess response to higher dose PPI and if symptoms persist I have advised him to contact his PCP.

## 2019-04-04 NOTE — Patient Instructions (Signed)
Your provider has requested that you go to the basement level for lab work before leaving today. Press "B" on the elevator. The lab is located at the first door on the left as you exit the elevator.  Increase your omeprazole to 40 mg daily. A new prescription has been sent to your pharmacy.   You have been scheduled for a CT scan of the abdomen and pelvis at [facility] ([address]---this is in the same building as [facility]).   You are scheduled on 04/20/19 at 4:00pm. You should arrive 15 minutes prior to your appointment time for registration. Please follow the written instructions below on the day of your exam:  WARNING: IF YOU ARE ALLERGIC TO IODINE/X-RAY DYE, PLEASE NOTIFY RADIOLOGY IMMEDIATELY AT [phone]! YOU WILL BE GIVEN A 13 HOUR PREMEDICATION PREP.  1) Do not eat or drink anything after 12:00pm (4 hours prior to your test) 2) You have been given 2 bottles of oral contrast to drink. The solution may taste better if refrigerated, but do NOT add ice or any other liquid to this solution. Shake well before drinking.    Drink 1 bottle of contrast @ 2:00pm (2 hours prior to your exam)  Drink 1 bottle of contrast @ 3:00pm (1 hour prior to your exam)  You may take any medications as prescribed with a small amount of water, if necessary. If you take any of the following medications: METFORMIN, GLUCOPHAGE, GLUCOVANCE, AVANDAMET, RIOMET, FORTAMET, [place] MET, JANUMET, GLUMETZA or METAGLIP, you MAY be asked to HOLD this medication 48 hours AFTER the exam.  The purpose of you drinking the oral contrast is to aid in the visualization of your intestinal tract. The contrast solution may cause some diarrhea. Depending on your individual set of symptoms, you may also receive an intravenous injection of x-ray contrast/dye. Plan on being at [facility] for 30 minutes or longer, depending on the type of exam you are having performed.  This test typically takes 30-45  minutes to complete.  If you have any questions regarding your exam or if you need to reschedule, you may call the CT department at [phone] between the hours of 8:00 am and 5:00 pm, Monday-Friday.  __________________________________________________________________ Thank you for choosing me and [facility].  Pricilla Riffle. Dagoberto Ligas., MD., [facility]

## 2019-04-13 DIAGNOSIS — N400 Enlarged prostate without lower urinary tract symptoms: Secondary | ICD-10-CM | POA: Diagnosis not present

## 2019-04-19 DIAGNOSIS — R972 Elevated prostate specific antigen [PSA]: Secondary | ICD-10-CM | POA: Diagnosis not present

## 2019-04-20 ENCOUNTER — Other Ambulatory Visit: Payer: Self-pay

## 2019-04-20 ENCOUNTER — Ambulatory Visit (INDEPENDENT_AMBULATORY_CARE_PROVIDER_SITE_OTHER)
Admission: RE | Admit: 2019-04-20 | Discharge: 2019-04-20 | Disposition: A | Discharge status: Home / Self Care | Payer: Medicare Other | Source: Ambulatory Visit | Attending: Gastroenterology | Admitting: Gastroenterology

## 2019-04-20 DIAGNOSIS — N281 Cyst of kidney, acquired: Secondary | ICD-10-CM | POA: Diagnosis not present

## 2019-04-20 DIAGNOSIS — K8020 Calculus of gallbladder without cholecystitis without obstruction: Secondary | ICD-10-CM | POA: Diagnosis not present

## 2019-04-20 DIAGNOSIS — K760 Fatty (change of) liver, not elsewhere classified: Secondary | ICD-10-CM | POA: Diagnosis not present

## 2019-04-20 DIAGNOSIS — R1030 Lower abdominal pain, unspecified: Secondary | ICD-10-CM | POA: Diagnosis not present

## 2019-04-20 MED ORDER — IOHEXOL 300 MG/ML  SOLN
100.0000 mL | Freq: Once | INTRAMUSCULAR | Status: AC | PRN
  Administered 2019-04-20: 100 mL via INTRAVENOUS

## 2019-04-25 ENCOUNTER — Telehealth: Payer: Self-pay | Admitting: Gastroenterology

## 2019-04-25 ENCOUNTER — Other Ambulatory Visit: Payer: Self-pay

## 2019-04-25 MED ORDER — DICYCLOMINE HCL 10 MG PO CAPS: 10.0000 mg | ORAL_CAPSULE | Freq: Three times a day (TID) | ORAL | 11 refills | Status: DC

## 2019-04-25 NOTE — Telephone Encounter (Signed)
Pt would like to discuss results of CT.

## 2019-04-25 NOTE — Telephone Encounter (Signed)
All the patient's questions answered.  He wanted to write down all the findings. He will call back for any additional questions or concerns.

## 2019-04-25 NOTE — Telephone Encounter (Signed)
See CT results for details

## 2019-05-10 ENCOUNTER — Telehealth: Payer: Self-pay | Admitting: Family Medicine

## 2019-05-10 NOTE — Telephone Encounter (Signed)
I left a message asking the patient to call me at [phone] to schedule AWV with [facility]. VDM (Dee-Dee) °

## 2019-06-04 ENCOUNTER — Ambulatory Visit (INDEPENDENT_AMBULATORY_CARE_PROVIDER_SITE_OTHER): Payer: Medicare Other | Admitting: Family Medicine

## 2019-06-04 ENCOUNTER — Encounter: Payer: Self-pay | Admitting: Family Medicine

## 2019-06-04 ENCOUNTER — Other Ambulatory Visit: Payer: Self-pay

## 2019-06-04 VITALS — BP 132/80 | HR 76 | Temp 98.1°F | Ht 66.0 in | Wt 217.4 lb

## 2019-06-04 DIAGNOSIS — E785 Hyperlipidemia, unspecified: Secondary | ICD-10-CM

## 2019-06-04 DIAGNOSIS — I2510 Atherosclerotic heart disease of native coronary artery without angina pectoris: Secondary | ICD-10-CM | POA: Diagnosis not present

## 2019-06-04 DIAGNOSIS — K760 Fatty (change of) liver, not elsewhere classified: Secondary | ICD-10-CM | POA: Diagnosis not present

## 2019-06-04 DIAGNOSIS — M1712 Unilateral primary osteoarthritis, left knee: Secondary | ICD-10-CM | POA: Diagnosis not present

## 2019-06-04 DIAGNOSIS — I714 Abdominal aortic aneurysm, without rupture, unspecified: Secondary | ICD-10-CM

## 2019-06-04 DIAGNOSIS — Z23 Encounter for immunization: Secondary | ICD-10-CM

## 2019-06-04 DIAGNOSIS — I700 Atherosclerosis of aorta: Secondary | ICD-10-CM

## 2019-06-04 DIAGNOSIS — I719 Aortic aneurysm of unspecified site, without rupture: Secondary | ICD-10-CM | POA: Insufficient documentation

## 2019-06-04 MED ORDER — TRAMADOL HCL 50 MG PO TABS: 50.0000 mg | ORAL_TABLET | Freq: Two times a day (BID) | ORAL | 3 refills | Status: DC | PRN

## 2019-06-04 NOTE — Assessment & Plan Note (Signed)
aortic atherosclerosis S: 3 cm aneurysm noted on ct 04/20/2019- 3 year repeat US planned.  Patient also was found to have aortic atherosclerosis A/P: We discussed aneurysm but also the fact that it is rather small-we will repeat imaging in 3 years.  We also discussed risk factor modification needed for aortic atherosclerosis

## 2019-06-04 NOTE — Assessment & Plan Note (Signed)
#   Fatty liver S:  Noted on CT 04/20/2019 done for lower abdominal pain- he was also started on Bentyl at that time- fortunately pain has largely resolved.  Patient has lost 3 pounds from last visit A/P: Noted on imaging-We will continue to trend LFTs which have been normal.  We discussed importance of healthy eating/regular exercise- we will continue efforts

## 2019-06-04 NOTE — Patient Instructions (Addendum)
Health Maintenance Due  Topic Date Due  . INFLUENZA VACCINE-today  03/31/2019   See me in 6 months unless you need [place] sooner- can refill meds for up to 6 months  No changes today

## 2019-06-04 NOTE — Progress Notes (Signed)
Phone [phone]   Subjective:  Jose [name] is a 78 y.o. year old very pleasant male patient who presents for/with See problem oriented charting Chief Complaint  Patient presents with  . Follow-up  . Gastroesophageal Reflux  . hyperglycemia  . Edema  . Hyperlipidemia   ROS- No fever, chills, cough, congestion, runny nose, shortness of breath, fatigue, body aches, sore throat, headache, nausea, vomiting, diarrhea, or new loss of taste or smell. No known contacts with covid 19 or someone being tested for covid 19.    Past Medical History-  Patient Active Problem List   Diagnosis Date Noted  . Aortic aneurysm ([place]) 06/04/2019    Priority: High  . Atherosclerosis of coronary artery 02/09/2007    Priority: High  . Barrett's esophagus 02/09/2007    Priority: High  . Fatty liver 06/04/2019    Priority: Medium  . GERD (gastroesophageal reflux disease) 10/23/2018    Priority: Medium  . Primary osteoarthritis of left knee 02/27/2018    Priority: Medium  . Elevated PSA 08/03/2016    Priority: Medium  . Benign prostatic hypertrophy (BPH) with nocturia 05/27/2016    Priority: Medium  . Hyperlipidemia 02/09/2007    Priority: Medium  . Aortic atherosclerosis ([place]) 06/04/2019    Priority: Low  . Acute prostatitis 02/21/2019    Priority: Low  . Sinus bradycardia 03/19/2015    Priority: Low  . Diverticulosis of colon with hemorrhage     Priority: Low  . Actinic keratosis 08/07/2014    Priority: Low  . Elevated blood pressure 03/29/2014    Priority: Low  . Hyperglycemia 02/26/2013    Priority: Low  . OBESITY 03/20/2010    Priority: Low  . Generalized anxiety disorder 08/22/2008    Priority: Low  . ERECTILE DYSFUNCTION 08/22/2008    Priority: Low  . Diverticulosis of large intestine 02/09/2007    Priority: Low  . Nasal congestion 10/23/2018  . Lumbosacral radiculopathy 06/17/2015  . Numbness 06/17/2015  . Right leg numbness   . Acute posthemorrhagic anemia  10/17/2014  . Gastrointestinal hemorrhage 10/15/2014    Medications- reviewed and updated Current Outpatient Medications  Medication Sig Dispense Refill  . aspirin EC 81 MG tablet Take 1 tablet (81 mg total) by mouth 2 (two) times daily. For DVT prophylaxis (Patient taking differently: Take 81 mg by mouth daily. For DVT prophylaxis) 60 tablet 0  . dicyclomine (BENTYL) 10 MG capsule Take 1 capsule (10 mg total) by mouth 3 (three) times daily before meals. 90 capsule 11  . omeprazole (PRILOSEC) 40 MG capsule Take 1 capsule (40 mg total) by mouth daily. 90 capsule 3  . rosuvastatin (CRESTOR) 20 MG tablet TAKE 1 TABLET DAILY 90 tablet 3  . tadalafil (ADCIRCA/CIALIS) 20 MG tablet TAKE ONE-HALF (1/2) TABLET DAILY AS NEEDED FOR ERECTILE DYSFUNCTION 8 tablet 18  . traMADol (ULTRAM) 50 MG tablet Take 1-2 tablets (50-100 mg total) by mouth 2 (two) times daily as needed for severe pain. Post-operatively 60 tablet 3   No current facility-administered medications for this visit.      Objective:  BP 132/80   Pulse 76   Temp 98.1 F (36.7 C)   Ht 5\' 6"  (1.676 m)   Wt 217 lb 6.4 oz (98.6 kg)   SpO2 93%   BMI 35.09 kg/m  Gen: NAD, resting comfortably CV: RRR no murmurs rubs or gallops Lungs: CTAB no crackles, wheeze, rhonchi Abdomen: soft/nontender/nondistended/normal bowel sounds.  Ext: no edema Skin: warm, dry     Assessment  and Plan   # Fatty liver S:  Noted on CT 04/20/2019 done for lower abdominal pain- he was also started on Bentyl at that time- fortunately pain has largely resolved.  Patient has lost 3 pounds from last visit A/P: Noted on imaging-We will continue to trend LFTs which have been normal.  We discussed importance of healthy eating/regular exercise- we will continue efforts  # Aortic aneurysm/aortic atherosclerosis S: 3 cm aneurysm noted on ct 04/20/2019- 3 year repeat US planned.  Patient also was found to have aortic atherosclerosis A/P: We discussed aneurysm but also the  fact that it is rather small-we will repeat imaging in 3 years.  We also discussed risk factor modification needed for aortic atherosclerosis  #Hyperglycemia s: Diet controlled in the past--recent increase in A1c again Lab Results  Component Value Date   HGBA1C 5.8 02/19/2019  A/P: Discussed importance of weight loss for this along with fatty liver-congratulated on 3 pound weight loss   #Hyperlipidemia/CAD s: Well controlled on rosuvastatin 20 mg.  Ideal LDL under 70  Patient compliant with aspirin and denies chest pain or shortness of breath Lab Results  Component Value Date   CHOL 107 06/20/2018   HDL 38.70 (L) 06/20/2018   LDLCALC 41 06/20/2018   LDLDIRECT 100.0 12/19/2017   TRIG 137.0 06/20/2018   CHOLHDL 3 06/20/2018    A/P: Lipids have  been at goal-continue current medication- planning on 6 months repeat and will need to update full lipids at that time  CAD appears stable-continue current medication and follow-up with cardiology.  He reports he may be having an updated stress test in the next year as apparently has been over 3 years possibly close to 5  #Knee pain  S: History of left knee replacement-with continued pain in the left knee despite this patient using 1 tramadol in the morning and 1 in the evening for his knees.  We received a letter from [facility] in 2020 requesting that we begin his long-term tramadol prescription. A/P: Chronic knee pain- patient is using approximately 1 tramadol a day-really helps him when he is up on his feet- refilled #60 with 3 refills to last for 6 months  #Elevated PSA-following up with urology yearly typically- had a case of prostatitis and will be having 57-month repeat. 02/19/19. Dr. Louis Meckel detected PSA  5.5 but had just finished antibiotic for prostatitis.   Recommended follow up: 36-month follow-up planned and will need labs at that time  Lab/Order associations:   ICD-10-CM   1. Primary osteoarthritis of left knee  M17.12   2.  Atherosclerosis of native coronary artery of native heart without angina pectoris  I25.10   3. Abdominal aortic aneurysm (AAA) without rupture (HCC) Chronic I71.4   4. Fatty liver  K76.0   5. Aortic atherosclerosis (HCC)  I70.0   6. Need for immunization against influenza  Z23 Flu Vaccine QUAD High Dose(Fluad)  7. Hyperlipidemia, unspecified hyperlipidemia type  E78.5     Meds ordered this encounter  Medications  . traMADol (ULTRAM) 50 MG tablet    Sig: Take 1-2 tablets (50-100 mg total) by mouth 2 (two) times daily as needed for severe pain. Post-operatively    Dispense:  60 tablet    Refill:  3    Return precautions advised.  [name], MD

## 2019-07-06 ENCOUNTER — Ambulatory Visit (INDEPENDENT_AMBULATORY_CARE_PROVIDER_SITE_OTHER)
Admission: EM | Admit: 2019-07-06 | Discharge: 2019-07-06 | Disposition: A | Discharge status: Home / Self Care | Payer: Medicare Other | Source: Home / Self Care

## 2019-07-06 DIAGNOSIS — H9201 Otalgia, right ear: Secondary | ICD-10-CM

## 2019-07-06 DIAGNOSIS — M545 Low back pain, unspecified: Secondary | ICD-10-CM

## 2019-07-06 LAB — POCT URINALYSIS DIP (MANUAL ENTRY)
Glucose, UA: NEGATIVE mg/dL
Ketones, POC UA: NEGATIVE mg/dL
Leukocytes, UA: NEGATIVE
Nitrite, UA: NEGATIVE
Protein Ur, POC: 100 mg/dL — AB
Spec Grav, UA: >=1.03 — AB (ref 1.010–1.025)
Urobilinogen, UA: 1 E.U./dL
pH, UA: 5.5 (ref 5.0–8.0)

## 2019-07-06 MED ORDER — KETOROLAC TROMETHAMINE 30 MG/ML IJ SOLN
30.0000 mg | Freq: Once | INTRAMUSCULAR | Status: AC
  Administered 2019-07-06: 30 mg via INTRAMUSCULAR

## 2019-07-06 MED ORDER — DICLOFENAC SODIUM 1 % TD GEL: 2.0000 g | Freq: Four times a day (QID) | TRANSDERMAL | 0 refills | Status: DC

## 2019-07-06 NOTE — ED Triage Notes (Signed)
Pt c/o rt flank pain radiating around to lower abdomen x1wk, states today his urine is dark. Pt c/o pain behind lt ear x1wk

## 2019-07-06 NOTE — ED Provider Notes (Signed)
[name]    CSN: [id number] Arrival date & time: 07/06/19  1945      History   Chief Complaint Chief Complaint  Patient presents with  . Flank Pain    HPI Jose [name] is a 78 y.o. male with history of basal cell cancer, kidney stones, hypertension presenting for right low back pain for the last week.  States is gotten worse over the last few days.  States pain is "tender ", constant, nonradiating.  Patient nursing history of multiple kidney stones, has undergone lithotripsy in the past due to enlarged stones.  Patient has not tried nothing for symptoms.  Patient endorses history of BPH, though denies weakened stream, dribbling, incontinence.  No rectal pain, hematochezia, melena, painful BM. Patient also noting sharp, shooting pain that is intermittent behind his right ear.  Patient does have history of SCC of affected area: Is status post removal.  Patient denies change in hearing, tinnitus, dizziness, headache, fever.  Denies trauma to the area, rash.  Patient does wear hearing aids, though denies change in device prior to symptom onset.  Has not tried anything for this.   Past Medical History:  Diagnosis Date  . Acute lower GI bleeding 01/25/2013; 09/2014   diverticular bleed  . Acute prostatitis 02/21/2019  . ANXIETY 08/22/2008  . Arthritis    "a little bit" (04/14/2015)  . Barrett's esophagus 2003   hematochezia as a result since 2003  . Basal cell cancer    removed from right forearm.   . Bradycardia 04/14/2015  . CORONARY ARTERY DISEASE    2 stents  . Depression   . DIVERTICULOSIS, COLON 2003  . ERECTILE DYSFUNCTION 08/22/2008  . GERD (gastroesophageal reflux disease)   . Heart murmur 04/14/2015   "they think I did; that's why I'm here" (04/14/2015)  . History of kidney stones   . HYPERLIPIDEMIA 02/09/2007  . Kidney stones    "blasted or passed" (04/14/2015)  . MI (myocardial infarction) ([place]) 2000  . Numbness    right side - history of   . OBESITY  03/20/2010  . Squamous cell cancer of skin of earlobe    removed from area behind right earlobe  . Stroke [facility])    4 yrs ago possible mini stroke -  tests never found anything    Patient Active Problem List   Diagnosis Date Noted  . Aortic aneurysm ([place]) 06/04/2019  . Fatty liver 06/04/2019  . Aortic atherosclerosis ([place]) 06/04/2019  . Acute prostatitis 02/21/2019  . GERD (gastroesophageal reflux disease) 10/23/2018  . Nasal congestion 10/23/2018  . Primary osteoarthritis of left knee 02/27/2018  . Elevated PSA 08/03/2016  . Benign prostatic hypertrophy (BPH) with nocturia 05/27/2016  . Lumbosacral radiculopathy 06/17/2015  . Numbness 06/17/2015  . Right leg numbness   . Sinus bradycardia 03/19/2015  . Acute posthemorrhagic anemia 10/17/2014  . Diverticulosis of colon with hemorrhage   . Gastrointestinal hemorrhage 10/15/2014  . Actinic keratosis 08/07/2014  . Elevated blood pressure 03/29/2014  . Hyperglycemia 02/26/2013  . OBESITY 03/20/2010  . Generalized anxiety disorder 08/22/2008  . ERECTILE DYSFUNCTION 08/22/2008  . Hyperlipidemia 02/09/2007  . Atherosclerosis of coronary artery 02/09/2007  . Barrett's esophagus 02/09/2007  . Diverticulosis of large intestine 02/09/2007    Past Surgical History:  Procedure Laterality Date  . BASAL CELL CARCINOMA EXCISION     posterior right ear.   Marland Kitchen CATARACT EXTRACTION, BILATERAL    . COLONOSCOPY N/A 01/26/2013   Procedure: COLONOSCOPY;  Surgeon: Milus Banister,  MD;  Location: [facility];  Service: Endoscopy;  Laterality: N/A;  . CORONARY ANGIOPLASTY WITH STENT PLACEMENT  09/06/1998   LAD stent per Dr Elonda Husky in [place]. 2 stents.   . CYSTOSCOPY W/ URETERAL STENT PLACEMENT Left 12/21/2016   Procedure: CYSTOSCOPY WITH RETROGRADE PYELOGRAM/URETERAL STENT PLACEMENT;  Surgeon: Alexis Frock, MD;  Location: [facility];  Service: Urology;  Laterality: Left;  . ESOPHAGOGASTRODUODENOSCOPY (EGD) WITH ESOPHAGEAL DILATION     "q time I have  an EGD; usually q 3-4 years" (04/14/2015)  . EXTRACORPOREAL SHOCK WAVE LITHOTRIPSY Left 12/30/2016   Procedure: LEFT EXTRACORPOREAL SHOCK WAVE LITHOTRIPSY (ESWL);  Surgeon: Carolan Clines, MD;  Location: [facility];  Service: Urology;  Laterality: Left;  . LITHOTRIPSY  X 2  . SQUAMOUS CELL CARCINOMA EXCISION     right forearm  . TONSILLECTOMY  1971  . TOTAL KNEE ARTHROPLASTY Left 03/21/2018  . TOTAL KNEE ARTHROPLASTY Left 03/21/2018   Procedure: LEFT TOTAL KNEE ARTHROPLASTY;  Surgeon: Renette Butters, MD;  Location: [facility];  Service: Orthopedics;  Laterality: Left;  . UPPER GASTROINTESTINAL ENDOSCOPY    . URETERAL STENT PLACEMENT     2019       Home Medications    Prior to Admission medications   Medication Sig Start Date End Date Taking? Authorizing Provider  aspirin EC 81 MG tablet Take 1 tablet (81 mg total) by mouth 2 (two) times daily. For DVT prophylaxis Patient taking differently: Take 81 mg by mouth daily. For DVT prophylaxis 03/21/18   Prudencio Burly III, PA-C  diclofenac sodium (VOLTAREN) 1 % GEL Apply 2 g topically 4 (four) times daily. 07/06/19   Hall-Potvin, [place], PA-C  dicyclomine (BENTYL) 10 MG capsule Take 1 capsule (10 mg total) by mouth 3 (three) times daily before meals. 04/25/19   Ladene Artist, MD  omeprazole (PRILOSEC) 40 MG capsule Take 1 capsule (40 mg total) by mouth daily. 04/04/19   Ladene Artist, MD  rosuvastatin (CRESTOR) 20 MG tablet TAKE 1 TABLET DAILY 01/03/19   Marin Olp, MD  tadalafil (ADCIRCA/CIALIS) 20 MG tablet TAKE ONE-HALF (1/2) TABLET DAILY AS NEEDED FOR ERECTILE DYSFUNCTION 01/03/19   Marin Olp, MD  traMADol (ULTRAM) 50 MG tablet Take 1-2 tablets (50-100 mg total) by mouth 2 (two) times daily as needed for severe pain. Post-operatively 06/04/19   Marin Olp, MD    Family History Family History  Problem Relation Age of Onset  . Alcohol abuse Father   . Liver disease Father   . Heart disease Brother        CABG   . Colon cancer Neg Hx     Social History Social History   Tobacco Use  . Smoking status: Former Smoker    Packs/day: 1.00    Years: 30.00    Pack years: 30.00    Types: Cigarettes  . Smokeless tobacco: Never Used  . Tobacco comment: "quit smoking cigarettes in ~ 1990"  Substance Use Topics  . Alcohol use: Yes    Comment: 04/14/2015 "might drink a couple beers/month"  . Drug use: No     Allergies   Lipitor [atorvastatin]   Review of Systems Review of Systems  Constitutional: Negative for activity change, appetite change, fatigue and fever.  HENT: Positive for ear pain. Negative for congestion, dental problem, facial swelling, hearing loss, sinus pain, sore throat, trouble swallowing and voice change.   Eyes: Negative for photophobia, pain and visual disturbance.  Respiratory: Negative for cough, shortness of breath  and wheezing.   Cardiovascular: Negative for chest pain, palpitations and leg swelling.  Gastrointestinal: Negative for abdominal distention, abdominal pain, blood in stool, constipation, diarrhea, nausea, rectal pain and vomiting.  Genitourinary: Negative for discharge, dysuria, frequency, genital sores, hematuria, penile pain, penile swelling, scrotal swelling, testicular pain and urgency.  Musculoskeletal: Negative for arthralgias and myalgias.  Neurological: Negative for dizziness and headaches.     Physical Exam Triage Vital Signs ED Triage Vitals [07/06/19 1954]  Enc Vitals Group     BP (!) 148/92     Pulse Rate 70     Resp 20     Temp 98.2 F (36.8 C)     Temp Source Oral     SpO2 95 %     Weight      Height      Head Circumference      Peak Flow      Pain Score      Pain Loc      Pain Edu?      Excl. in [place]?    No data found.  Updated Vital Signs BP (!) 148/92 (BP Location: Left Arm)   Pulse 70   Temp 98.2 F (36.8 C) (Oral)   Resp 20   SpO2 95%   Visual Acuity Right Eye Distance:   Left Eye Distance:   Bilateral Distance:     Right Eye Near:   Left Eye Near:    Bilateral Near:     Physical Exam Constitutional:      General: He is not in acute distress. HENT:     Head: Normocephalic and atraumatic.     Right Ear: Tympanic membrane, ear canal and external ear normal.     Left Ear: Tympanic membrane, ear canal and external ear normal.     Ears:     Comments: Negative tragal tenderness bilaterally.  Right postauricular area with mild edema.  No erythema, TTP, fluctuance.    Mouth/Throat:     Mouth: Mucous membranes are moist.     Pharynx: Oropharynx is clear.  Eyes:     General: No scleral icterus.    Conjunctiva/sclera: Conjunctivae normal.     Pupils: Pupils are equal, round, and reactive to light.  Neck:     Musculoskeletal: Neck supple. No muscular tenderness.  Cardiovascular:     Rate and Rhythm: Normal rate.  Pulmonary:     Effort: Pulmonary effort is normal. No respiratory distress.     Breath sounds: No wheezing.  Abdominal:     General: There is no distension.     Tenderness: There is no abdominal tenderness. There is no right CVA tenderness or left CVA tenderness.  Musculoskeletal:     Comments: No bony deformity or scoliosis of lumbar spine.  No spinous process tenderness, PSIS tenderness.  Patient with vague right left-sided tenderness.  Lymphadenopathy:     Cervical: No cervical adenopathy.  Skin:    Capillary Refill: Capillary refill takes less than 2 seconds.     Coloration: Skin is not jaundiced or pale.     Findings: No rash.  Neurological:     General: No focal deficit present.     Mental Status: He is alert and oriented to person, place, and time.      UC Treatments / Results  Labs (all labs ordered are listed, but only abnormal results are displayed) Labs Reviewed  POCT URINALYSIS DIP (MANUAL ENTRY) - Abnormal; Notable for the following components:      Result Value  Bilirubin, UA small (*)    Spec Grav, UA >=1.030 (*)    Blood, UA trace-lysed (*)    Protein Ur, POC  =100 (*)    All other components within normal limits    EKG   Radiology No results found.  Procedures Procedures (including critical care time)  Medications Ordered in UC Medications  ketorolac (TORADOL) 30 MG/ML injection 30 mg (30 mg Intramuscular Given 07/06/19 2017)    Initial Impression / Assessment and Plan / UC Course  I have reviewed the triage vital signs and the nursing notes.  Pertinent labs & imaging results that were available during my care of the patient were reviewed by me and considered in my medical decision making (see chart for details).     1.  Acute right-sided low back pain without sciatica POCT urine dipstick done in office, reviewed by me: Positive for bilirubin, trace lysed blood, protein.  Negative leukocyte, nitrates: Culture deferred.  Given patient's history, symptoms highly suspicious for renal colic second to stone.  Does not appear to be obstructing given good urine output.  Low concern for prostatitis given lack of rectal pain, decreased stream, bowel issues.  Patient afebrile, nontoxic: Given Toradol injection in office which he tolerated well.  Reviewed strict ER return precautions, which patient verbalized understanding of.    2.  Will trial diclofenac gel behind the ear, ice for posterior ear swelling.  Given patient's history, advised that he follow-up with his dermatologist for further evaluation/management. Final Clinical Impressions(s) / UC Diagnoses   Final diagnoses:  Acute right-sided low back pain without sciatica  Right ear pain     Discharge Instructions     Apply diclofenac gel over spot in your ear that is painful. May also use ice for additional pain relief. If no improvement, follow-up with your skin doctor.  Right low back pain likely kidney stones given your history and urine test today: Toradol should help with this, though if your pain worsens, you develop blood in your urine, or inability urinate you should go to the  ER for further evaluation.    ED Prescriptions    Medication Sig Dispense Auth. Provider   diclofenac sodium (VOLTAREN) 1 % GEL Apply 2 g topically 4 (four) times daily. 100 g Hall-Potvin, [place], PA-C     PDMP not reviewed this encounter.   Hall-Potvin, [place], [place] 07/06/19 2025

## 2019-07-06 NOTE — Discharge Instructions (Signed)
Apply diclofenac gel over spot in your ear that is painful. May also use ice for additional pain relief. If no improvement, follow-up with your skin doctor.  Right low back pain likely kidney stones given your history and urine test today: Toradol should help with this, though if your pain worsens, you develop blood in your urine, or inability urinate you should go to the ER for further evaluation.

## 2019-07-07 ENCOUNTER — Emergency Department (HOSPITAL_COMMUNITY): Payer: Medicare Other

## 2019-07-07 ENCOUNTER — Encounter (HOSPITAL_COMMUNITY): Payer: Self-pay | Admitting: Emergency Medicine

## 2019-07-07 ENCOUNTER — Emergency Department (HOSPITAL_COMMUNITY)
Admission: EM | Admit: 2019-07-07 | Discharge: 2019-07-07 | Disposition: A | Discharge status: Home / Self Care | Payer: Medicare Other | Source: Home / Self Care | Attending: Emergency Medicine | Admitting: Emergency Medicine

## 2019-07-07 ENCOUNTER — Other Ambulatory Visit: Payer: Self-pay

## 2019-07-07 DIAGNOSIS — M545 Low back pain: Secondary | ICD-10-CM | POA: Diagnosis not present

## 2019-07-07 DIAGNOSIS — Z85828 Personal history of other malignant neoplasm of skin: Secondary | ICD-10-CM | POA: Insufficient documentation

## 2019-07-07 DIAGNOSIS — Z87891 Personal history of nicotine dependence: Secondary | ICD-10-CM | POA: Insufficient documentation

## 2019-07-07 DIAGNOSIS — R112 Nausea with vomiting, unspecified: Secondary | ICD-10-CM | POA: Diagnosis not present

## 2019-07-07 DIAGNOSIS — K8020 Calculus of gallbladder without cholecystitis without obstruction: Secondary | ICD-10-CM | POA: Diagnosis not present

## 2019-07-07 DIAGNOSIS — R519 Headache, unspecified: Secondary | ICD-10-CM | POA: Insufficient documentation

## 2019-07-07 DIAGNOSIS — Z7982 Long term (current) use of aspirin: Secondary | ICD-10-CM | POA: Insufficient documentation

## 2019-07-07 DIAGNOSIS — R0602 Shortness of breath: Secondary | ICD-10-CM | POA: Diagnosis not present

## 2019-07-07 DIAGNOSIS — I2510 Atherosclerotic heart disease of native coronary artery without angina pectoris: Secondary | ICD-10-CM | POA: Insufficient documentation

## 2019-07-07 DIAGNOSIS — J9601 Acute respiratory failure with hypoxia: Secondary | ICD-10-CM | POA: Diagnosis not present

## 2019-07-07 DIAGNOSIS — N400 Enlarged prostate without lower urinary tract symptoms: Secondary | ICD-10-CM | POA: Diagnosis not present

## 2019-07-07 DIAGNOSIS — I119 Hypertensive heart disease without heart failure: Secondary | ICD-10-CM | POA: Diagnosis not present

## 2019-07-07 DIAGNOSIS — H9201 Otalgia, right ear: Secondary | ICD-10-CM | POA: Diagnosis not present

## 2019-07-07 DIAGNOSIS — R531 Weakness: Secondary | ICD-10-CM | POA: Diagnosis not present

## 2019-07-07 DIAGNOSIS — U071 COVID-19: Secondary | ICD-10-CM | POA: Insufficient documentation

## 2019-07-07 DIAGNOSIS — Z79899 Other long term (current) drug therapy: Secondary | ICD-10-CM | POA: Insufficient documentation

## 2019-07-07 LAB — CBC WITH DIFFERENTIAL/PLATELET
Abs Immature Granulocytes: 0 10*3/uL (ref 0.00–0.07)
Basophils Absolute: 0 10*3/uL (ref 0.0–0.1)
Basophils Relative: 1 %
Eosinophils Absolute: 0 10*3/uL (ref 0.0–0.5)
Eosinophils Relative: 0 %
HCT: 45.1 % (ref 39.0–52.0)
Hemoglobin: 15.1 g/dL (ref 13.0–17.0)
Immature Granulocytes: 0 %
Lymphocytes Relative: 27 %
Lymphs Abs: 0.5 10*3/uL — ABNORMAL LOW (ref 0.7–4.0)
MCH: 29.3 pg (ref 26.0–34.0)
MCHC: 33.5 g/dL (ref 30.0–36.0)
MCV: 87.4 fL (ref 80.0–100.0)
Monocytes Absolute: 0.5 10*3/uL (ref 0.1–1.0)
Monocytes Relative: 28 %
Neutro Abs: 0.8 10*3/uL — ABNORMAL LOW (ref 1.7–7.7)
Neutrophils Relative %: 44 %
Platelets: 108 10*3/uL — ABNORMAL LOW (ref 150–400)
RBC: 5.16 MIL/uL (ref 4.22–5.81)
RDW: 13.5 % (ref 11.5–15.5)
WBC: 1.8 10*3/uL — ABNORMAL LOW (ref 4.0–10.5)
nRBC: 0 % (ref 0.0–0.2)

## 2019-07-07 LAB — COMPREHENSIVE METABOLIC PANEL
ALT: 21 U/L (ref 0–44)
AST: 30 U/L (ref 15–41)
Albumin: 3.4 g/dL — ABNORMAL LOW (ref 3.5–5.0)
Alkaline Phosphatase: 85 U/L (ref 38–126)
Anion gap: 9 (ref 5–15)
BUN: 8 mg/dL (ref 8–23)
CO2: 21 mmol/L — ABNORMAL LOW (ref 22–32)
Calcium: 8.4 mg/dL — ABNORMAL LOW (ref 8.9–10.3)
Chloride: 103 mmol/L (ref 98–111)
Creatinine, Ser: 0.95 mg/dL (ref 0.61–1.24)
GFR calc Af Amer: >60 mL/min (ref >60)
GFR calc non Af Amer: >60 mL/min (ref >60)
Glucose, Bld: 93 mg/dL (ref 70–99)
Potassium: 3.5 mmol/L (ref 3.5–5.1)
Sodium: 133 mmol/L — ABNORMAL LOW (ref 135–145)
Total Bilirubin: 0.6 mg/dL (ref 0.3–1.2)
Total Protein: 6.6 g/dL (ref 6.5–8.1)

## 2019-07-07 LAB — URINALYSIS, ROUTINE W REFLEX MICROSCOPIC
Bacteria, UA: NONE SEEN
Bilirubin Urine: NEGATIVE
Glucose, UA: NEGATIVE mg/dL
Hgb urine dipstick: NEGATIVE
Ketones, ur: NEGATIVE mg/dL
Leukocytes,Ua: NEGATIVE
Nitrite: NEGATIVE
Protein, ur: 30 mg/dL — AB
Specific Gravity, Urine: 1.026 (ref 1.005–1.030)
pH: 5 (ref 5.0–8.0)

## 2019-07-07 LAB — TROPONIN I (HIGH SENSITIVITY)
Troponin I (High Sensitivity): 17 ng/L (ref <18)
Troponin I (High Sensitivity): 18 ng/L — ABNORMAL HIGH (ref <18)

## 2019-07-07 LAB — LIPASE, BLOOD: Lipase: 32 U/L (ref 11–51)

## 2019-07-07 MED ORDER — HYDROCODONE-ACETAMINOPHEN 5-325 MG PO TABS: 1.0000 | ORAL_TABLET | ORAL | 0 refills | Status: DC | PRN

## 2019-07-07 MED ORDER — HYDROCODONE-ACETAMINOPHEN 5-325 MG PO TABS: 1.0000 | ORAL_TABLET | Freq: Once | ORAL | Status: DC

## 2019-07-07 MED ORDER — DOXYCYCLINE HYCLATE 100 MG PO CAPS: 100.0000 mg | ORAL_CAPSULE | Freq: Two times a day (BID) | ORAL | 0 refills | Status: DC

## 2019-07-07 NOTE — ED Notes (Signed)
EDP [place] at bedside 

## 2019-07-07 NOTE — Discharge Instructions (Addendum)
Please take your Vicodin, as prescribed.  Do not take Tylenol in addition to the Vicodin. Please note that this medication can make you drowsy and prone to falls.  Do not take Vicodin in addition to your prescribed tramadol.  Please take your doxycycline, as prescribed.    Please return to the ED should he develop any chest pain or difficulty breathing, worsening headache, visual changes, numbness, weakness, or other neurologic deficits, inability to urinate, or any other new or worsening symptoms.    If you live with, or provide care at home for, a person confirmed to have, or being evaluated for, COVID-19 infection please follow these guidelines to prevent infection:  Follow healthcare providers instructions Make sure that you understand and can help the patient follow any healthcare provider instructions for all care.  Provide for the patients basic needs You should help the patient with basic needs in the home and provide support for getting groceries, prescriptions, and other personal needs.  Monitor the patients symptoms If they are getting sicker, call his or her medical provider a  This will help the healthcare providers office take steps to keep other people from getting infected. Ask the healthcare provider to call the local or state health department.  Limit the number of people who have contact with the patient If possible, have only one caregiver for the patient. Other household members should stay in another home or place of residence. If this is not possible, they should stay in another room, or be separated from the patient as much as possible. Use a separate bathroom, if available. Restrict visitors who do not have an essential need to be in the home.  Keep older adults, very young children, and other sick people away from the patient Keep older adults, very young children, and those who have compromised immune systems or chronic health conditions away from the  patient. This includes people with chronic heart, lung, or kidney conditions, diabetes, and cancer.  Ensure good ventilation Make sure that shared spaces in the home have good air flow, such as from an air conditioner or an opened window, weather permitting.  Wash your hands often Wash your hands often and thoroughly with soap and water for at least 20 seconds. You can use an alcohol based hand sanitizer if soap and water are not available and if your hands are not visibly dirty. Avoid touching your eyes, nose, and mouth with unwashed hands. Use disposable paper towels to dry your hands. If not available, use dedicated cloth towels and replace them when they become wet.  Wear a facemask and gloves Wear a disposable facemask at all times in the room and gloves when you touch or have contact with the patients blood, body fluids, and/or secretions or excretions, such as sweat, saliva, sputum, nasal mucus, vomit, urine, or feces.  Ensure the mask fits over your nose and mouth tightly, and do not touch it during use. Throw out disposable facemasks and gloves after using them. Do not reuse. Wash your hands immediately after removing your facemask and gloves. If your personal clothing becomes contaminated, carefully remove clothing and launder. Wash your hands after handling contaminated clothing. Place all used disposable facemasks, gloves, and other waste in a lined container before disposing them with other household waste. Remove gloves and wash your hands immediately after handling these items.  Do not share dishes, glasses, or other household items with the patient Avoid sharing household items. You should not share dishes, drinking glasses, cups, eating  utensils, towels, bedding, or other items After the person uses these items, you should wash them thoroughly with soap and water.  Wash laundry thoroughly Immediately remove and wash clothes or bedding that have blood, body fluids, and/or  secretions or excretions, such as sweat, saliva, sputum, nasal mucus, vomit, urine, or feces, on them. Wear gloves when handling laundry from the patient. Read and follow directions on labels of laundry or clothing items and detergent. In general, wash and dry with the warmest temperatures recommended on the label.  Clean all areas the individual has used often Clean all touchable surfaces, such as counters, tabletops, doorknobs, bathroom fixtures, toilets, phones, keyboards, tablets, and bedside tables, every day. Also, clean any surfaces that may have blood, body fluids, and/or secretions or excretions on them. Wear gloves when cleaning surfaces the patient has come in contact with. Use a diluted bleach solution (e.g., dilute bleach with 1 part bleach and 10 parts water) or a household disinfectant with a label that says EPA-registered for coronaviruses. To make a bleach solution at home, add 1 tablespoon of bleach to 1 quart (4 cups) of water. For a larger supply, add  cup of bleach to 1 gallon (16 cups) of water. Read labels of cleaning products and follow recommendations provided on product labels. Labels contain instructions for safe and effective use of the cleaning product including precautions you should take when applying the product, such as wearing gloves or eye protection and making sure you have good ventilation during use of the product. Remove gloves and wash hands immediately after cleaning.  Monitor yourself for signs and symptoms of illness Caregivers and household members are considered close contacts, should monitor their health, and will be asked to limit movement outside of the home to the extent possible. Follow the monitoring steps for close contacts listed on the symptom monitoring form.   ? If you have additional questions, contact your local health department or call the epidemiologist on call at [phone] (available 24/7). ? This guidance is subject to change. For  the most up-to-date guidance from [facility], please refer to their website: [email or web address]

## 2019-07-07 NOTE — ED Triage Notes (Signed)
Pt here for eval of bilateral lower back pain, worse on the right, and headaches located to right posterior head for 1 week. States he was seen at [facility] and told he might have a kidney stone, hx of stones. Denies N/V/D. Urine is dark. Pt reports some tenderness to abdomen.

## 2019-07-07 NOTE — ED Provider Notes (Signed)
[facility] EMERGENCY DEPARTMENT Provider Note   CSN: [id number] Arrival date & time: 07/07/19  [id number]     History   Chief Complaint Chief Complaint  Patient presents with   Back Pain   Headache    HPI Jose [name] is a 78 y.o. male with PMH significant for squamous cell cancer, basal cell cancer, HLD, ACS, GERD, and kidney stones who presents to the ED with complaints of progressively worsening right-sided flank pain.  Patient was evaluated by UC yesterday and was informed that he may have a kidney stone given his history and symptoms.  He was provided diclofenac gel and was instructed to return to the ED should he develop any worsening symptoms or inability to urinate.  Patient states that he was in the [place] with his wife and another couple last weekend.  He has had this type of headache before, but typically resolves with Tylenol.  He has taken 6-7 500 mg pills of Tylenol each day, with no relief.  Currently his headache is a 7 out of 10 in his back pain is a 4 out of 10.  He reports that he may be dehydrated and has had diminished urine output.  He denies any fever or chills, runny nose or sore throat, dizziness, chest pain or shortness of breath, cough, abdominal discomfort, nausea or vomiting, obvious sick contacts, or change in bowel habits.     HPI  Past Medical History:  Diagnosis Date   Acute lower GI bleeding 01/25/2013; 09/2014   diverticular bleed   Acute prostatitis 02/21/2019   ANXIETY 08/22/2008   Arthritis    "a little bit" (04/14/2015)   Barrett's esophagus 2003   hematochezia as a result since 2003   Basal cell cancer    removed from right forearm.    Bradycardia 04/14/2015   CORONARY ARTERY DISEASE    2 stents   Depression    DIVERTICULOSIS, COLON 2003   ERECTILE DYSFUNCTION 08/22/2008   GERD (gastroesophageal reflux disease)    Heart murmur 04/14/2015   "they think I did; that's why I'm here" (04/14/2015)   History of  kidney stones    HYPERLIPIDEMIA 02/09/2007   Kidney stones    "blasted or passed" (04/14/2015)   MI (myocardial infarction) ([place]) 2000   Numbness    right side - history of    OBESITY 03/20/2010   Squamous cell cancer of skin of earlobe    removed from area behind right earlobe   Stroke ([place])    4 yrs ago possible mini stroke -  tests never found anything    Patient Active Problem List   Diagnosis Date Noted   Aortic aneurysm ([place]) 06/04/2019   Fatty liver 06/04/2019   Aortic atherosclerosis ([place]) 06/04/2019   Acute prostatitis 02/21/2019   GERD (gastroesophageal reflux disease) 10/23/2018   Nasal congestion 10/23/2018   Primary osteoarthritis of left knee 02/27/2018   Elevated PSA 08/03/2016   Benign prostatic hypertrophy (BPH) with nocturia 05/27/2016   Lumbosacral radiculopathy 06/17/2015   Numbness 06/17/2015   Right leg numbness    Sinus bradycardia 03/19/2015   Acute posthemorrhagic anemia 10/17/2014   Diverticulosis of colon with hemorrhage    Gastrointestinal hemorrhage 10/15/2014   Actinic keratosis 08/07/2014   Elevated blood pressure 03/29/2014   Hyperglycemia 02/26/2013   OBESITY 03/20/2010   Generalized anxiety disorder 08/22/2008   ERECTILE DYSFUNCTION 08/22/2008   Hyperlipidemia 02/09/2007   Atherosclerosis of coronary artery 02/09/2007   Barrett's esophagus 02/09/2007   Diverticulosis  of large intestine 02/09/2007    Past Surgical History:  Procedure Laterality Date   BASAL CELL CARCINOMA EXCISION     posterior right ear.    CATARACT EXTRACTION, BILATERAL     COLONOSCOPY N/A 01/26/2013   Procedure: COLONOSCOPY;  Surgeon: Milus Banister, MD;  Location: [facility];  Service: Endoscopy;  Laterality: N/A;   CORONARY ANGIOPLASTY WITH STENT PLACEMENT  09/06/1998   LAD stent per Dr Elonda Husky in [place]. 2 stents.    CYSTOSCOPY W/ URETERAL STENT PLACEMENT Left 12/21/2016   Procedure: CYSTOSCOPY WITH RETROGRADE  PYELOGRAM/URETERAL STENT PLACEMENT;  Surgeon: Alexis Frock, MD;  Location: [facility];  Service: Urology;  Laterality: Left;   ESOPHAGOGASTRODUODENOSCOPY (EGD) WITH ESOPHAGEAL DILATION     "q time I have an EGD; usually q 3-4 years" (04/14/2015)   EXTRACORPOREAL SHOCK WAVE LITHOTRIPSY Left 12/30/2016   Procedure: LEFT EXTRACORPOREAL SHOCK WAVE LITHOTRIPSY (ESWL);  Surgeon: Carolan Clines, MD;  Location: [facility];  Service: Urology;  Laterality: Left;   LITHOTRIPSY  X 2   SQUAMOUS CELL CARCINOMA EXCISION     right forearm   TONSILLECTOMY  1971   TOTAL KNEE ARTHROPLASTY Left 03/21/2018   TOTAL KNEE ARTHROPLASTY Left 03/21/2018   Procedure: LEFT TOTAL KNEE ARTHROPLASTY;  Surgeon: Renette Butters, MD;  Location: [facility];  Service: Orthopedics;  Laterality: Left;   UPPER GASTROINTESTINAL ENDOSCOPY     URETERAL STENT PLACEMENT     2019        Home Medications    Prior to Admission medications   Medication Sig Start Date End Date Taking? Authorizing Provider  aspirin EC 81 MG tablet Take 1 tablet (81 mg total) by mouth 2 (two) times daily. For DVT prophylaxis Patient taking differently: Take 81 mg by mouth daily. For DVT prophylaxis 03/21/18  Yes Prudencio Burly III, PA-C  omeprazole (PRILOSEC) 40 MG capsule Take 1 capsule (40 mg total) by mouth daily. 04/04/19  Yes Ladene Artist, MD  rosuvastatin (CRESTOR) 20 MG tablet TAKE 1 TABLET DAILY 01/03/19  Yes Marin Olp, MD  traMADol (ULTRAM) 50 MG tablet Take 1-2 tablets (50-100 mg total) by mouth 2 (two) times daily as needed for severe pain. Post-operatively 06/04/19  Yes Marin Olp, MD  diclofenac sodium (VOLTAREN) 1 % GEL Apply 2 g topically 4 (four) times daily. Patient not taking: Reported on 07/07/2019 07/06/19   Hall-Potvin, [place], PA-C  dicyclomine (BENTYL) 10 MG capsule Take 1 capsule (10 mg total) by mouth 3 (three) times daily before meals. Patient not taking: Reported on 07/07/2019 04/25/19   Ladene Artist,  MD  doxycycline (VIBRAMYCIN) 100 MG capsule Take 1 capsule (100 mg total) by mouth 2 (two) times daily. 07/07/19   Corena Herter, PA-C  HYDROcodone-acetaminophen (NORCO/VICODIN) 5-325 MG tablet Take 1-2 tablets by mouth every 4 (four) hours as needed for severe pain. 07/07/19   Corena Herter, PA-C  tadalafil (ADCIRCA/CIALIS) 20 MG tablet TAKE ONE-HALF (1/2) TABLET DAILY AS NEEDED FOR ERECTILE DYSFUNCTION 01/03/19   Marin Olp, MD    Family History Family History  Problem Relation Age of Onset   Alcohol abuse Father    Liver disease Father    Heart disease Brother        CABG   Colon cancer Neg Hx     Social History Social History   Tobacco Use   Smoking status: Former Smoker    Packs/day: 1.00    Years: 30.00    Pack years: 30.00  Types: Cigarettes   Smokeless tobacco: Never Used   Tobacco comment: "quit smoking cigarettes in ~ 1990"  Substance Use Topics   Alcohol use: Yes    Comment: 04/14/2015 "might drink a couple beers/month"   Drug use: No     Allergies   Lipitor [atorvastatin]   Review of Systems Review of Systems  All other systems reviewed and are negative.    Physical Exam Updated Vital Signs BP 134/90    Pulse 69    Temp 98.6 F (37 C)    Resp (!) 22    SpO2 92%   Physical Exam Vitals signs and nursing note reviewed. Exam conducted with a chaperone present.  Constitutional:      Appearance: Normal appearance.  HENT:     Head: Normocephalic and atraumatic.     Right Ear: Tympanic membrane, ear canal and external ear normal.     Left Ear: Tympanic membrane, ear canal and external ear normal.  Eyes:     General: No scleral icterus.    Conjunctiva/sclera: Conjunctivae normal.  Cardiovascular:     Rate and Rhythm: Normal rate and regular rhythm.     Pulses: Normal pulses.  Pulmonary:     Comments: Mildly increased work of breathing.  Normal breath sounds bilaterally.  No chest tenderness to palpation. Abdominal:     General:  Abdomen is flat. There is no distension.     Palpations: Abdomen is soft.     Tenderness: There is no abdominal tenderness. There is right CVA tenderness. There is no guarding.  Skin:    General: Skin is dry.  Neurological:     Mental Status: He is alert.     GCS: GCS eye subscore is 4. GCS verbal subscore is 5. GCS motor subscore is 6.  Psychiatric:        Mood and Affect: Mood normal.        Behavior: Behavior normal.        Thought Content: Thought content normal.      ED Treatments / Results  Labs (all labs ordered are listed, but only abnormal results are displayed) Labs Reviewed  COMPREHENSIVE METABOLIC PANEL - Abnormal; Notable for the following components:      Result Value   Sodium 133 (*)    CO2 21 (*)    Calcium 8.4 (*)    Albumin 3.4 (*)    All other components within normal limits  CBC WITH DIFFERENTIAL/PLATELET - Abnormal; Notable for the following components:   WBC 1.8 (*)    Platelets 108 (*)    Neutro Abs 0.8 (*)    Lymphs Abs 0.5 (*)    All other components within normal limits  URINALYSIS, ROUTINE W REFLEX MICROSCOPIC - Abnormal; Notable for the following components:   APPearance HAZY (*)    Protein, ur 30 (*)    All other components within normal limits  TROPONIN I (HIGH SENSITIVITY) - Abnormal; Notable for the following components:   Troponin I (High Sensitivity) 18 (*)    All other components within normal limits  LIPASE, BLOOD  TROPONIN I (HIGH SENSITIVITY)    EKG None  Radiology Dg Chest 2 View  Result Date: 07/07/2019 CLINICAL DATA:  Shortness of breath, back pain EXAM: CHEST - 2 VIEW COMPARISON:  04/14/2015 FINDINGS: There is mild bilateral interstitial thickening. There is no focal consolidation. There is no pleural effusion or pneumothorax. There is stable cardiomegaly. There is no acute osseous abnormality. IMPRESSION: Cardiomegaly with mild pulmonary vascular congestion. Electronically  Signed   By: Kathreen Devoid   On: 07/07/2019 16:00    Ct Renal Stone Study  Result Date: 07/07/2019 CLINICAL DATA:  Pt states he has lower right side abdominal pain EXAM: CT ABDOMEN AND PELVIS WITHOUT CONTRAST TECHNIQUE: Multidetector CT imaging of the abdomen and pelvis was performed following the standard protocol without IV contrast. COMPARISON:  CT abdomen pelvis 04/20/2019 FINDINGS: Lower chest: Coronary artery calcification. No pericardial or pleural effusion. There are scattered ill-defined ground-glass opacities in the left lung base. Evaluation of the abdominal viscera is somewhat limited by the lack of IV contrast. Hepatobiliary: No focal liver abnormality is seen. Small amount of layering gallstones in the gallbladder. No gallbladder wall thickening, or biliary dilatation. Pancreas: Unremarkable Spleen: Normal in size without focal abnormality. Adrenals/Urinary Tract: Adrenal glands are unremarkable. Bilateral renal cysts. No renal calculi or hydronephrosis. Bladder is unremarkable. Stomach/Bowel: Stomach is within normal limits. Appendix appears normal. No evidence of bowel wall thickening, distention, or inflammatory changes. Multiple scattered colonic diverticula without evidence of diverticulitis. Vascular/Lymphatic: Extensive aortoiliac atherosclerotic calcification with a 3.3 cm aneurysm of the infrarenal abdominal aorta. Reproductive: Prostate is unremarkable. Other: Mild periumbilical fat containing hernia. No abdominopelvic ascites. Musculoskeletal: No acute or significant osseous findings. IMPRESSION: 1. No renal calculi or hydronephrosis. 2. No other acute intra-abdominal pathology on a noncontrast scan. 3. 3.3 cm infrarenal abdominal aortic aneurysm. 4. Cholelithiasis without evidence of acute cholecystitis. 5. Scattered colonic diverticula without evidence of diverticulitis. 6. Coronary artery calcification. 7. Scattered ill-defined ground-glass opacities in the left lung base which are nonspecific and may represent atelectasis, atypical  infection or inflammation not excluded. Electronically Signed   By: Audie Pinto M.D.   On: 07/07/2019 10:59    Procedures Procedures (including critical care time)  Medications Ordered in ED Medications - No data to display   Initial Impression / Assessment and Plan / ED Course  I have reviewed the triage vital signs and the nursing notes.  Pertinent labs & imaging results that were available during my care of the patient were reviewed by me and considered in my medical decision making (see chart for details).       Given patient's extensive history of kidney stones, obtained CT renal stone study which was interpreted and demonstrates no acute intra-abdominal pathology, no obstructing renal calculi or hydronephrosis, and no evidence of diverticulitis.  CT does reveal scattered ill-defined opacities in left lung base, possibly concerning for atypical infection.  Patient does have mild increased work of breathing on my physical exam and was tachypneic in the 30s.  Ambulate patient with pulse oximeter and he was able to maintain saturation between 95-97 on room air, only briefly dipping to 88% immediately prior to sitting down on the bed.  Given patient's mildly increased work of breathing on my physical exam, despite no reported chest pain or shortness of breath, obtained EKG, chest x-ray, and high-sensitivity troponin.  His initial high-sensitivity troponin was 17, well within normal months.  His EKG and chest x-ray demonstrated no acute findings.  Patient has leukocytopenia to 1.8 WBC, but otherwise is laboratory work-up was interpreted and is otherwise reassuring.  Patient reports that his headache has been insidious and was not a "thunderclap" presentation.  He does not complain of any neurologic deficits and there are no abnormal neurologic findings on physical exam.  While he reports pain in the area of his right mastoid, he denies any tenderness to palpation is neither erythematous  nor swollen and I am not concerned  for mastoiditis.  Exam of his right ear demonstrates no evidence of AOM.  Do not feel as though CT head is warranted at this time.  Given that it has gone on for approximately 1 week without relief from Tylenol, proposed imaging, but patient declined as it had resolved in the past.  Shared decision making.   Offered admission as option to the patient for his suspected pneumonia and acute onset, intermittent hypoxia, but patient declined.  Will start patient on doxycycline outpatient and prescribed Vicodin for setting to take, as needed.  Will obtain COVID-19 testing, encouraged patient to follow isolation guidelines until he receives the results of his testing.  Recommend that he follow-up with his PCP, Dr. Yong Channel, as needed.    Instructed patient to return to the ED should he develop any chest pain or difficulty breathing, worsening headache, visual changes, numbness, weakness, or other neurologic deficits, inability to urinate, or any other new or worsening symptoms. All of the evaluation and work-up results were discussed with the patient and any family at bedside. They were provided opportunity to ask any additional questions and have none at this time. They have expressed understanding of verbal discharge instructions as well as return precautions and are agreeable to the plan.   Jose [name] was evaluated in Emergency Department on 07/07/2019 for the symptoms described in the history of present illness. He was evaluated in the context of the global COVID-19 pandemic, which necessitated consideration that the patient might be at risk for infection with the SARS-CoV-2 virus that causes COVID-19. Institutional protocols and algorithms that pertain to the evaluation of patients at risk for COVID-19 are in a state of rapid change based on information released by regulatory bodies including the CDC and federal and state organizations. These policies and algorithms were  followed during the patient's care in the ED.   Final Clinical Impressions(s) / ED Diagnoses   Final diagnoses:  Acute nonintractable headache, unspecified headache type    ED Discharge Orders         Ordered    HYDROcodone-acetaminophen (NORCO/VICODIN) 5-325 MG tablet  Every 4 hours PRN     07/07/19 1556    doxycycline (VIBRAMYCIN) 100 MG capsule  2 times daily     07/07/19 1606           Corena Herter, PA-C 07/07/19 1607    Tegeler, Gwenyth Allegra, MD 07/08/19 [phone]

## 2019-07-07 NOTE — ED Notes (Signed)
Walked patient around the nurses station oxygen level stayed at 97 and 95 room air the patient got ready to sit on the bed and it went down to 88 but went right back up after sitting on the bed patient is resting with call bell in reach

## 2019-07-07 NOTE — ED Notes (Signed)
Patient verbalizes understanding of discharge instructions. Opportunity for questioning and answers were provided. Armband removed by staff, pt discharged from ED.  

## 2019-07-07 NOTE — ED Notes (Signed)
Patient transported to CT 

## 2019-07-08 LAB — SARS CORONAVIRUS 2 (TAT 6-24 HRS): SARS Coronavirus 2: POSITIVE — AB

## 2019-07-09 ENCOUNTER — Inpatient Hospital Stay (HOSPITAL_COMMUNITY)
Admission: EM | Admit: 2019-07-09 | Discharge: 2019-07-13 | DRG: 177 | Disposition: A | Discharge status: Home / Self Care | Payer: Medicare Other | Attending: Family Medicine | Admitting: Family Medicine

## 2019-07-09 ENCOUNTER — Telehealth: Payer: Self-pay | Admitting: Family Medicine

## 2019-07-09 ENCOUNTER — Emergency Department (HOSPITAL_COMMUNITY): Payer: Medicare Other

## 2019-07-09 DIAGNOSIS — J9600 Acute respiratory failure, unspecified whether with hypoxia or hypercapnia: Secondary | ICD-10-CM | POA: Diagnosis not present

## 2019-07-09 DIAGNOSIS — K219 Gastro-esophageal reflux disease without esophagitis: Secondary | ICD-10-CM | POA: Diagnosis present

## 2019-07-09 DIAGNOSIS — Z7401 Bed confinement status: Secondary | ICD-10-CM | POA: Diagnosis not present

## 2019-07-09 DIAGNOSIS — N529 Male erectile dysfunction, unspecified: Secondary | ICD-10-CM | POA: Diagnosis present

## 2019-07-09 DIAGNOSIS — I252 Old myocardial infarction: Secondary | ICD-10-CM | POA: Diagnosis not present

## 2019-07-09 DIAGNOSIS — Z85828 Personal history of other malignant neoplasm of skin: Secondary | ICD-10-CM | POA: Diagnosis not present

## 2019-07-09 DIAGNOSIS — R0981 Nasal congestion: Secondary | ICD-10-CM | POA: Diagnosis not present

## 2019-07-09 DIAGNOSIS — N400 Enlarged prostate without lower urinary tract symptoms: Secondary | ICD-10-CM | POA: Diagnosis present

## 2019-07-09 DIAGNOSIS — Z96652 Presence of left artificial knee joint: Secondary | ICD-10-CM | POA: Diagnosis present

## 2019-07-09 DIAGNOSIS — M545 Low back pain: Secondary | ICD-10-CM | POA: Diagnosis present

## 2019-07-09 DIAGNOSIS — D696 Thrombocytopenia, unspecified: Secondary | ICD-10-CM | POA: Diagnosis present

## 2019-07-09 DIAGNOSIS — Z8249 Family history of ischemic heart disease and other diseases of the circulatory system: Secondary | ICD-10-CM

## 2019-07-09 DIAGNOSIS — I119 Hypertensive heart disease without heart failure: Secondary | ICD-10-CM | POA: Diagnosis present

## 2019-07-09 DIAGNOSIS — R0602 Shortness of breath: Secondary | ICD-10-CM | POA: Diagnosis not present

## 2019-07-09 DIAGNOSIS — R531 Weakness: Secondary | ICD-10-CM | POA: Diagnosis not present

## 2019-07-09 DIAGNOSIS — K2270 Barrett's esophagus without dysplasia: Secondary | ICD-10-CM | POA: Diagnosis present

## 2019-07-09 DIAGNOSIS — I714 Abdominal aortic aneurysm, without rupture: Secondary | ICD-10-CM | POA: Diagnosis present

## 2019-07-09 DIAGNOSIS — U071 COVID-19: Principal | ICD-10-CM | POA: Diagnosis present

## 2019-07-09 DIAGNOSIS — Z8616 Personal history of COVID-19: Secondary | ICD-10-CM | POA: Diagnosis present

## 2019-07-09 DIAGNOSIS — R52 Pain, unspecified: Secondary | ICD-10-CM | POA: Diagnosis not present

## 2019-07-09 DIAGNOSIS — R7989 Other specified abnormal findings of blood chemistry: Secondary | ICD-10-CM | POA: Diagnosis not present

## 2019-07-09 DIAGNOSIS — Z87442 Personal history of urinary calculi: Secondary | ICD-10-CM | POA: Diagnosis not present

## 2019-07-09 DIAGNOSIS — Z8673 Personal history of transient ischemic attack (TIA), and cerebral infarction without residual deficits: Secondary | ICD-10-CM | POA: Diagnosis not present

## 2019-07-09 DIAGNOSIS — K760 Fatty (change of) liver, not elsewhere classified: Secondary | ICD-10-CM | POA: Diagnosis present

## 2019-07-09 DIAGNOSIS — E876 Hypokalemia: Secondary | ICD-10-CM | POA: Diagnosis not present

## 2019-07-09 DIAGNOSIS — Z8619 Personal history of other infectious and parasitic diseases: Secondary | ICD-10-CM

## 2019-07-09 DIAGNOSIS — R519 Headache, unspecified: Secondary | ICD-10-CM | POA: Diagnosis present

## 2019-07-09 DIAGNOSIS — Z6835 Body mass index (BMI) 35.0-35.9, adult: Secondary | ICD-10-CM

## 2019-07-09 DIAGNOSIS — I2510 Atherosclerotic heart disease of native coronary artery without angina pectoris: Secondary | ICD-10-CM | POA: Diagnosis present

## 2019-07-09 DIAGNOSIS — F419 Anxiety disorder, unspecified: Secondary | ICD-10-CM | POA: Diagnosis present

## 2019-07-09 DIAGNOSIS — I719 Aortic aneurysm of unspecified site, without rupture: Secondary | ICD-10-CM | POA: Diagnosis present

## 2019-07-09 DIAGNOSIS — Z888 Allergy status to other drugs, medicaments and biological substances status: Secondary | ICD-10-CM

## 2019-07-09 DIAGNOSIS — M5489 Other dorsalgia: Secondary | ICD-10-CM | POA: Diagnosis not present

## 2019-07-09 DIAGNOSIS — Z79899 Other long term (current) drug therapy: Secondary | ICD-10-CM

## 2019-07-09 DIAGNOSIS — F329 Major depressive disorder, single episode, unspecified: Secondary | ICD-10-CM | POA: Diagnosis present

## 2019-07-09 DIAGNOSIS — J9601 Acute respiratory failure with hypoxia: Secondary | ICD-10-CM | POA: Diagnosis present

## 2019-07-09 DIAGNOSIS — H9201 Otalgia, right ear: Secondary | ICD-10-CM | POA: Diagnosis present

## 2019-07-09 DIAGNOSIS — R112 Nausea with vomiting, unspecified: Secondary | ICD-10-CM | POA: Diagnosis not present

## 2019-07-09 DIAGNOSIS — E785 Hyperlipidemia, unspecified: Secondary | ICD-10-CM | POA: Diagnosis present

## 2019-07-09 DIAGNOSIS — E669 Obesity, unspecified: Secondary | ICD-10-CM | POA: Diagnosis present

## 2019-07-09 DIAGNOSIS — M2550 Pain in unspecified joint: Secondary | ICD-10-CM | POA: Diagnosis not present

## 2019-07-09 DIAGNOSIS — Z87891 Personal history of nicotine dependence: Secondary | ICD-10-CM

## 2019-07-09 DIAGNOSIS — Z7982 Long term (current) use of aspirin: Secondary | ICD-10-CM

## 2019-07-09 DIAGNOSIS — I5031 Acute diastolic (congestive) heart failure: Secondary | ICD-10-CM | POA: Diagnosis not present

## 2019-07-09 LAB — CBC WITH DIFFERENTIAL/PLATELET
Abs Immature Granulocytes: 0.01 10*3/uL (ref 0.00–0.07)
Basophils Absolute: 0 10*3/uL (ref 0.0–0.1)
Basophils Relative: 0 %
Eosinophils Absolute: 0 10*3/uL (ref 0.0–0.5)
Eosinophils Relative: 0 %
HCT: 45.8 % (ref 39.0–52.0)
Hemoglobin: 15.3 g/dL (ref 13.0–17.0)
Immature Granulocytes: 0 %
Lymphocytes Relative: 12 %
Lymphs Abs: 0.3 10*3/uL — ABNORMAL LOW (ref 0.7–4.0)
MCH: 28.9 pg (ref 26.0–34.0)
MCHC: 33.4 g/dL (ref 30.0–36.0)
MCV: 86.6 fL (ref 80.0–100.0)
Monocytes Absolute: 0.6 10*3/uL (ref 0.1–1.0)
Monocytes Relative: 22 %
Neutro Abs: 1.8 10*3/uL (ref 1.7–7.7)
Neutrophils Relative %: 66 %
Platelets: 93 10*3/uL — ABNORMAL LOW (ref 150–400)
RBC: 5.29 MIL/uL (ref 4.22–5.81)
RDW: 13.3 % (ref 11.5–15.5)
WBC: 2.7 10*3/uL — ABNORMAL LOW (ref 4.0–10.5)
nRBC: 0 % (ref 0.0–0.2)

## 2019-07-09 LAB — COMPREHENSIVE METABOLIC PANEL
ALT: 25 U/L (ref 0–44)
AST: 46 U/L — ABNORMAL HIGH (ref 15–41)
Albumin: 3.4 g/dL — ABNORMAL LOW (ref 3.5–5.0)
Alkaline Phosphatase: 79 U/L (ref 38–126)
Anion gap: 14 (ref 5–15)
BUN: 11 mg/dL (ref 8–23)
CO2: 18 mmol/L — ABNORMAL LOW (ref 22–32)
Calcium: 8.8 mg/dL — ABNORMAL LOW (ref 8.9–10.3)
Chloride: 101 mmol/L (ref 98–111)
Creatinine, Ser: 1.07 mg/dL (ref 0.61–1.24)
GFR calc Af Amer: >60 mL/min (ref >60)
GFR calc non Af Amer: >60 mL/min (ref >60)
Glucose, Bld: 116 mg/dL — ABNORMAL HIGH (ref 70–99)
Potassium: 3.7 mmol/L (ref 3.5–5.1)
Sodium: 133 mmol/L — ABNORMAL LOW (ref 135–145)
Total Bilirubin: 1.3 mg/dL — ABNORMAL HIGH (ref 0.3–1.2)
Total Protein: 6.6 g/dL (ref 6.5–8.1)

## 2019-07-09 LAB — LACTATE DEHYDROGENASE: LDH: 310 U/L — ABNORMAL HIGH (ref 98–192)

## 2019-07-09 LAB — FIBRINOGEN: Fibrinogen: 536 mg/dL — ABNORMAL HIGH (ref 210–475)

## 2019-07-09 LAB — PROCALCITONIN: Procalcitonin: <0.1 ng/mL

## 2019-07-09 LAB — TRIGLYCERIDES: Triglycerides: 52 mg/dL (ref <150)

## 2019-07-09 LAB — LACTIC ACID, PLASMA
Lactic Acid, Venous: 1.4 mmol/L (ref 0.5–1.9)
Lactic Acid, Venous: 1.3 mmol/L (ref 0.5–1.9)

## 2019-07-09 LAB — D-DIMER, QUANTITATIVE: D-Dimer, Quant: 1.14 ug/mL-FEU — ABNORMAL HIGH (ref 0.00–0.50)

## 2019-07-09 MED ORDER — ONDANSETRON HCL 4 MG PO TABS: 4.0000 mg | ORAL_TABLET | Freq: Four times a day (QID) | ORAL | Status: DC | PRN

## 2019-07-09 MED ORDER — PANTOPRAZOLE SODIUM 40 MG PO TBEC
80.0000 mg | DELAYED_RELEASE_TABLET | Freq: Every day | ORAL | Status: DC
  Administered 2019-07-10 – 2019-07-13 (×4): 80 mg via ORAL
  Filled 2019-07-09 (×4): qty 2

## 2019-07-09 MED ORDER — ACETAMINOPHEN 325 MG PO TABS: 650.0000 mg | ORAL_TABLET | Freq: Four times a day (QID) | ORAL | Status: DC | PRN

## 2019-07-09 MED ORDER — ROSUVASTATIN CALCIUM 20 MG PO TABS
20.0000 mg | ORAL_TABLET | Freq: Every day | ORAL | Status: DC
  Administered 2019-07-10 – 2019-07-12 (×3): 20 mg via ORAL
  Filled 2019-07-09 (×3): qty 1

## 2019-07-09 MED ORDER — ALBUTEROL SULFATE HFA 108 (90 BASE) MCG/ACT IN AERS
2.0000 | INHALATION_SPRAY | Freq: Four times a day (QID) | RESPIRATORY_TRACT | Status: DC
  Administered 2019-07-09 – 2019-07-13 (×12): 2 via RESPIRATORY_TRACT
  Filled 2019-07-09 (×3): qty 6.7

## 2019-07-09 MED ORDER — ONDANSETRON HCL 4 MG/2ML IJ SOLN: 4.0000 mg | Freq: Four times a day (QID) | INTRAMUSCULAR | Status: DC | PRN

## 2019-07-09 MED ORDER — DEXAMETHASONE 6 MG PO TABS
6.0000 mg | ORAL_TABLET | ORAL | Status: DC
  Administered 2019-07-09 – 2019-07-12 (×4): 6 mg via ORAL
  Filled 2019-07-09 (×2): qty 1
  Filled 2019-07-09: qty 2
  Filled 2019-07-09: qty 1

## 2019-07-09 MED ORDER — GUAIFENESIN-DM 100-10 MG/5ML PO SYRP: 10.0000 mL | ORAL_SOLUTION | ORAL | Status: DC | PRN

## 2019-07-09 MED ORDER — SODIUM CHLORIDE 0.9 % IV SOLN
200.0000 mg | Freq: Once | INTRAVENOUS | Status: AC
  Administered 2019-07-09: 22:00:00 200 mg via INTRAVENOUS
  Filled 2019-07-09: qty 40

## 2019-07-09 MED ORDER — ENOXAPARIN SODIUM 60 MG/0.6ML ~~LOC~~ SOLN
50.0000 mg | SUBCUTANEOUS | Status: DC
  Filled 2019-07-09 (×2): qty 0.5

## 2019-07-09 MED ORDER — ASPIRIN EC 81 MG PO TBEC
81.0000 mg | DELAYED_RELEASE_TABLET | Freq: Every day | ORAL | Status: DC
  Administered 2019-07-10 – 2019-07-11 (×2): 81 mg via ORAL
  Filled 2019-07-09 (×2): qty 1

## 2019-07-09 MED ORDER — HYDROCOD POLST-CPM POLST ER 10-8 MG/5ML PO SUER: 5.0000 mL | Freq: Two times a day (BID) | ORAL | Status: DC | PRN

## 2019-07-09 MED ORDER — SODIUM CHLORIDE 0.9 % IV SOLN
100.0000 mg | INTRAVENOUS | Status: AC
  Administered 2019-07-10 – 2019-07-13 (×4): 100 mg via INTRAVENOUS
  Filled 2019-07-09: qty 20
  Filled 2019-07-09: qty 100
  Filled 2019-07-09 (×3): qty 20

## 2019-07-09 NOTE — Progress Notes (Signed)
Pharmacy Note - Remdesivir Dosing  O:  ALT: 25 CXR (07/09/2019): patchy interstitial changes consistent with patient's clinical history of COVID-19  Requiring supplemental O2: 2L [place] after O2 sat was 88-90% on RA (per ED RN note)   A: 78 yo male presented on 07/09/2019 with SOB, nausea, vomiting, diarrhea and with recent positive COVID-19 test on 07/07/2019. Patient meets criteria for remdesivir.  P:   Begin remdesivir 200 mg IV x 1, followed by 100 mg IV daily x 4 days  Monitor ALT, clinical progress   07/09/2019 8:10 PM

## 2019-07-09 NOTE — Telephone Encounter (Signed)
Patient is calling back to check on advise from Dr. Yong Channel. Please advise [phone]- [phone]

## 2019-07-09 NOTE — ED Notes (Signed)
Pt placed on 2L [place], Oxygen Saturation was 88%-90% on RA

## 2019-07-09 NOTE — Telephone Encounter (Signed)
Appears patient went back to the hospital and tested positive for COVID-19.  It may be the nausea and vomiting were actually related to COVID-19.  Please tell patient I am sorry but I was unable to review my messages until the evening but I am certainly glad he went in to get care

## 2019-07-09 NOTE — H&P (Signed)
History and Physical    Jose [name] [id number] DOB: [birth year] DOA: 07/09/2019  PCP: Marin Olp, MD  Patient coming from: Home  I have personally briefly reviewed patient's old medical records in [place]  Chief Complaint: SOB  HPI: Jose [name] is a 78 y.o. male with medical history significant of Barrett's esophagus, diverticular bleed.  Patient seen in ED x2 days ago with headache, flank pain.  CT stone study: Neg for Stone, did show some scattered ground glass opacities in lower lungs, non-specific.  Found to have PNA.  Covid testing performed, started on doxycycline.  Returns to ED today now with N/V/D he attributes to the doxycycline.  Also increased SOB.   ED Course: New O2 requirement.  COVID from x2 days ago has come back positive.  WBC 2.7k, up slightly from 1.8k x2 days ago.   Review of Systems: As per HPI, otherwise all review of systems negative.  Past Medical History:  Diagnosis Date  . Acute lower GI bleeding 01/25/2013; 09/2014   diverticular bleed  . Acute prostatitis 02/21/2019  . ANXIETY 08/22/2008  . Arthritis    "a little bit" (04/14/2015)  . Barrett's esophagus 2003   hematochezia as a result since 2003  . Basal cell cancer    removed from right forearm.   . Bradycardia 04/14/2015  . CORONARY ARTERY DISEASE    2 stents  . Depression   . DIVERTICULOSIS, COLON 2003  . ERECTILE DYSFUNCTION 08/22/2008  . GERD (gastroesophageal reflux disease)   . Heart murmur 04/14/2015   "they think I did; that's why I'm here" (04/14/2015)  . History of kidney stones   . HYPERLIPIDEMIA 02/09/2007  . Kidney stones    "blasted or passed" (04/14/2015)  . MI (myocardial infarction) ([place]) 2000  . Numbness    right side - history of   . OBESITY 03/20/2010  . Squamous cell cancer of skin of earlobe    removed from area behind right earlobe  . Stroke [facility])    4 yrs ago possible mini stroke -  tests never found anything    Past Surgical  History:  Procedure Laterality Date  . BASAL CELL CARCINOMA EXCISION     posterior right ear.   Marland Kitchen CATARACT EXTRACTION, BILATERAL    . COLONOSCOPY N/A 01/26/2013   Procedure: COLONOSCOPY;  Surgeon: Milus Banister, MD;  Location: [facility];  Service: Endoscopy;  Laterality: N/A;  . CORONARY ANGIOPLASTY WITH STENT PLACEMENT  09/06/1998   LAD stent per Dr Elonda Husky in [place]. 2 stents.   . CYSTOSCOPY W/ URETERAL STENT PLACEMENT Left 12/21/2016   Procedure: CYSTOSCOPY WITH RETROGRADE PYELOGRAM/URETERAL STENT PLACEMENT;  Surgeon: Alexis Frock, MD;  Location: [facility];  Service: Urology;  Laterality: Left;  . ESOPHAGOGASTRODUODENOSCOPY (EGD) WITH ESOPHAGEAL DILATION     "q time I have an EGD; usually q 3-4 years" (04/14/2015)  . EXTRACORPOREAL SHOCK WAVE LITHOTRIPSY Left 12/30/2016   Procedure: LEFT EXTRACORPOREAL SHOCK WAVE LITHOTRIPSY (ESWL);  Surgeon: Carolan Clines, MD;  Location: [facility];  Service: Urology;  Laterality: Left;  . LITHOTRIPSY  X 2  . SQUAMOUS CELL CARCINOMA EXCISION     right forearm  . TONSILLECTOMY  1971  . TOTAL KNEE ARTHROPLASTY Left 03/21/2018  . TOTAL KNEE ARTHROPLASTY Left 03/21/2018   Procedure: LEFT TOTAL KNEE ARTHROPLASTY;  Surgeon: Renette Butters, MD;  Location: [facility];  Service: Orthopedics;  Laterality: Left;  . UPPER GASTROINTESTINAL ENDOSCOPY    . URETERAL STENT PLACEMENT  2019     reports that he has quit smoking. His smoking use included cigarettes. He has a 30.00 pack-year smoking history. He has never used smokeless tobacco. He reports current alcohol use. He reports that he does not use drugs.  Allergies  Allergen Reactions  . Lipitor [Atorvastatin]     REACTION: muscle weakness    Family History  Problem Relation Age of Onset  . Alcohol abuse Father   . Liver disease Father   . Heart disease Brother        CABG  . Colon cancer Neg Hx      Prior to Admission medications   Medication Sig Start Date End Date Taking? Authorizing Provider   aspirin EC 81 MG tablet Take 1 tablet (81 mg total) by mouth 2 (two) times daily. For DVT prophylaxis Patient taking differently: Take 81 mg by mouth daily. For DVT prophylaxis 03/21/18   Prudencio Burly III, PA-C  omeprazole (PRILOSEC) 40 MG capsule Take 1 capsule (40 mg total) by mouth daily. 04/04/19   Ladene Artist, MD  rosuvastatin (CRESTOR) 20 MG tablet TAKE 1 TABLET DAILY 01/03/19   Marin Olp, MD  tadalafil (ADCIRCA/CIALIS) 20 MG tablet TAKE ONE-HALF (1/2) TABLET DAILY AS NEEDED FOR ERECTILE DYSFUNCTION 01/03/19   Marin Olp, MD  traMADol (ULTRAM) 50 MG tablet Take 1-2 tablets (50-100 mg total) by mouth 2 (two) times daily as needed for severe pain. Post-operatively 06/04/19   Marin Olp, MD    Physical Exam: Vitals:   07/09/19 1930 07/09/19 1945 07/09/19 2000 07/09/19 2015  BP: 139/90 (!) 139/96 (!) 128/114 136/73  Pulse: 89 80 83 (!) 105  Resp: (!) 31 (!) 33 (!) 36 (!) 29  Temp:      TempSrc:      SpO2: 93% 91% 91% 93%  Weight:    98.4 kg  Height:    5\' 6"  (1.676 m)    Constitutional: NAD, calm, comfortable Eyes: PERRL, lids and conjunctivae normal ENMT: Mucous membranes are moist. Posterior pharynx clear of any exudate or lesions.Normal dentition.  Neck: normal, supple, no masses, no thyromegaly Respiratory: clear to auscultation bilaterally, no wheezing, no crackles. Normal respiratory effort. No accessory muscle use.  Cardiovascular: Regular rate and rhythm, no murmurs / rubs / gallops. No extremity edema. 2+ pedal pulses. No carotid bruits.  Abdomen: no tenderness, no masses palpated. No hepatosplenomegaly. Bowel sounds positive.  Musculoskeletal: no clubbing / cyanosis. No joint deformity upper and lower extremities. Good ROM, no contractures. Normal muscle tone.  Skin: no rashes, lesions, ulcers. No induration Neurologic: CN 2-12 grossly intact. Sensation intact, DTR normal. Strength 5/5 in all 4.  Psychiatric: Normal judgment and insight.  Alert and oriented x 3. Normal mood.    Labs on Admission: I have personally reviewed following labs and imaging studies  CBC: Recent Labs  Lab 07/07/19 1020 07/09/19 1803  WBC 1.8* 2.7*  NEUTROABS 0.8* 1.8  HGB 15.1 15.3  HCT 45.1 45.8  MCV 87.4 86.6  PLT 108* 93*   Basic Metabolic Panel: Recent Labs  Lab 07/07/19 1020 07/09/19 1803  NA 133* 133*  K 3.5 3.7  CL 103 101  CO2 21* 18*  GLUCOSE 93 116*  BUN 8 11  CREATININE 0.95 1.07  CALCIUM 8.4* 8.8*   GFR: Estimated Creatinine Clearance: 65.5 mL/min (by C-G formula based on SCr of 1.07 mg/dL). Liver Function Tests: Recent Labs  Lab 07/07/19 1020 07/09/19 1803  AST 30 46*  ALT 21 25  ALKPHOS 85 79  BILITOT 0.6 1.3*  PROT 6.6 6.6  ALBUMIN 3.4* 3.4*   Recent Labs  Lab 07/07/19 1020  LIPASE 32   No results for input(s): AMMONIA in the last 168 hours. Coagulation Profile: No results for input(s): INR, PROTIME in the last 168 hours. Cardiac Enzymes: No results for input(s): CKTOTAL, CKMB, CKMBINDEX, TROPONINI in the last 168 hours. BNP (last 3 results) No results for input(s): PROBNP in the last 8760 hours. HbA1C: No results for input(s): HGBA1C in the last 72 hours. CBG: No results for input(s): GLUCAP in the last 168 hours. Lipid Profile: Recent Labs    07/09/19 1803  TRIG 52   Thyroid Function Tests: No results for input(s): TSH, T4TOTAL, FREET4, T3FREE, THYROIDAB in the last 72 hours. Anemia Panel: No results for input(s): VITAMINB12, FOLATE, FERRITIN, TIBC, IRON, RETICCTPCT in the last 72 hours. Urine analysis:    Component Value Date/Time   COLORURINE YELLOW 07/07/2019 1021   APPEARANCEUR HAZY (A) 07/07/2019 1021   LABSPEC 1.026 07/07/2019 1021   PHURINE 5.0 07/07/2019 1021   GLUCOSEU NEGATIVE 07/07/2019 1021   GLUCOSEU NEGATIVE 04/18/2009 0808   HGBUR NEGATIVE 07/07/2019 1021   HGBUR 2+ 02/27/2008 0819   BILIRUBINUR NEGATIVE 07/07/2019 1021   BILIRUBINUR small (A) 07/06/2019 1959    KETONESUR NEGATIVE 07/07/2019 1021   PROTEINUR 30 (A) 07/07/2019 1021   UROBILINOGEN 1.0 07/06/2019 1959   UROBILINOGEN 1.0 04/14/2015 1359   NITRITE NEGATIVE 07/07/2019 1021   LEUKOCYTESUR NEGATIVE 07/07/2019 1021    Radiological Exams on Admission: Dg Chest Port 1 View  Result Date: 07/09/2019 CLINICAL DATA:  History of COVID-19 positivity with shortness of breath EXAM: PORTABLE CHEST 1 VIEW COMPARISON:  07/07/2019 FINDINGS: Cardiac shadow is mildly prominent but accentuated by the portable technique. Aortic calcifications are noted. Patient is rotated to the right accentuating the mediastinal markings. Patchy increased interstitial markings are seen bilaterally particularly in the right mid lung and left lung base increased from the prior exam consistent with the patient's given clinical history. No sizable effusion is noted. No bony abnormality is seen. IMPRESSION: Patchy interstitial changes consistent with the patient's given clinical history Electronically Signed   By: Inez Catalina M.D.   On: 07/09/2019 19:02    EKG: Independently reviewed.  Assessment/Plan Principal Problem:   Acute respiratory failure due to COVID-19 ([place])    1. New O2 requirement due to COVID-19 PNA - 1. COVID pathway 2. remdesivir 3. Decadron 4. Daily labs 5. Cont pulse ox 6. CRP pending 7. Procalcitonin pending 2. H/o diverticular bleed - 1. No active bleed 2. Watch for bleeding with DVT ppx 3. H/o Barrett's esophagus - 1. Q3YR EGDs 2. Continue PPI, especially with decadron 4. H/o small infrarenal AAA - 1. Next imaging due in 2023  DVT prophylaxis: Lovenox Code Status: Full Family Communication: No family in room Disposition Plan: Home after admit Consults called: None Admission status: Admit to inpatient  Severity of Illness: The appropriate patient status for this patient is INPATIENT. Inpatient status is judged to be reasonable and necessary in order to provide the required intensity of  service to ensure the patient's safety. The patient's presenting symptoms, physical exam findings, and initial radiographic and laboratory data in the context of their chronic comorbidities is felt to place them at high risk for further clinical deterioration. Furthermore, it is not anticipated that the patient will be medically stable for discharge from the hospital within 2 midnights of admission. The following factors support the patient status  of inpatient.   IP status due to new O2 requirement from COVID-19 PNA.  * I certify that at the point of admission it is my clinical judgment that the patient will require inpatient hospital care spanning beyond 2 midnights from the point of admission due to high intensity of service, high risk for further deterioration and high frequency of surveillance required.*    ,  M. DO [facility]  How to contact the [facility] Attending or Consulting provider [place] or covering provider during after hours [place], for this patient?  1. Check the care team in [facility] and look for a) attending/consulting [facility] provider listed and b) the [facility] team listed 2. Log into [email or web address]  Amion Physician Scheduling and messaging for groups and whole hospitals  On call and physician scheduling software for group practices, residents, hospitalists and other medical providers for call, clinic, rotation and shift schedules. On[employer] is a hospital-wide system for scheduling doctors and paging doctors on call. EasyPlot is for scientific plotting and data analysis.  [email or web address]  and use [place]'s universal password to access. If you do not have the password, please contact the hospital operator.  3. Locate the [facility] provider you are looking for under [facility] and page to a number that you can be directly reached. 4. If you still have difficulty reaching the provider, please page the [facility] (Director on Call) for the Hospitalists listed on amion for  assistance.  07/09/2019, 8:35 PM

## 2019-07-09 NOTE — Telephone Encounter (Signed)
Called and spoke with pt regarding below pt states he is having a reaction to doxycycline for pneumonia he started taking this Saturday evening and developed diarrhea and nauseous after taking this. He would like to know if there is something else you can Rx for him that will not cause these side effects because its making him feel worse.

## 2019-07-09 NOTE — ED Notes (Signed)
Attempted to draw blood, will attempt later or will have Phlebotomy attempt

## 2019-07-09 NOTE — Telephone Encounter (Signed)
Patient called back again noone has reach ed out to patient .  Please advise   Called and spoke with pt regarding below pt states he is having a reaction to doxycycline for pneumonia he started taking this Saturday evening and developed diarrhea and nauseous after taking this. He would like to know if there is something else you can Rx for him that will not cause these side effects because its making him feel worse.

## 2019-07-09 NOTE — Telephone Encounter (Signed)
Copied from [place] [phone]. Topic: General - Other >> Jul 09, 2019  8:01 AM Keene Breath wrote: Reason for CRM: Patient went to the ER over the weekend and got doxycycline (VIBRAMYCIN) 100 MG capsule, which is giving him a bad reaction.    He would like a different antibiotic.  Please call asap with a different medication.  CB# [phone]

## 2019-07-09 NOTE — Telephone Encounter (Signed)
See below

## 2019-07-09 NOTE — ED Triage Notes (Signed)
Pt was here Saturday for Pneumonia. Tested then and is now here today with a Covid-19 positive result. Given Antibiotic on Saturday and felt ill with N/V/D with it.   VSS currently on RA

## 2019-07-09 NOTE — ED Provider Notes (Addendum)
[facility] EMERGENCY DEPARTMENT Provider Note   CSN: [id number] Arrival date & time: 07/09/19  1622     History   Chief Complaint Chief Complaint  Patient presents with   Covid/ pneumonia    HPI Jose [name] is a 78 y.o. male.     HPI Patient presents with shortness of breath nausea vomiting diarrhea.  Recently in the ER for flank pain and feeling bad.  X-ray reassuring but CT scan showed potential atypical pneumonia.  Started on doxycycline.  Patient then developed more nausea vomiting diarrhea.  Has had occasional sats dropped to the 80s then but with ambulated and sats remained in the 90s.  Offered admission but went home instead.  Since then he has not been doing that well.  States he has had more nausea vomiting diarrhea.  States he blames it on the doxycycline.  More fatigued.  More short of breath.  States that he thinks he needs a different antibiotic. Past Medical History:  Diagnosis Date   Acute lower GI bleeding 01/25/2013; 09/2014   diverticular bleed   Acute prostatitis 02/21/2019   ANXIETY 08/22/2008   Arthritis    "a little bit" (04/14/2015)   Barrett's esophagus 2003   hematochezia as a result since 2003   Basal cell cancer    removed from right forearm.    Bradycardia 04/14/2015   CORONARY ARTERY DISEASE    2 stents   Depression    DIVERTICULOSIS, COLON 2003   ERECTILE DYSFUNCTION 08/22/2008   GERD (gastroesophageal reflux disease)    Heart murmur 04/14/2015   "they think I did; that's why I'm here" (04/14/2015)   History of kidney stones    HYPERLIPIDEMIA 02/09/2007   Kidney stones    "blasted or passed" (04/14/2015)   MI (myocardial infarction) ([place]) 2000   Numbness    right side - history of    OBESITY 03/20/2010   Squamous cell cancer of skin of earlobe    removed from area behind right earlobe   Stroke ([place])    4 yrs ago possible mini stroke -  tests never found anything    Patient Active Problem List     Diagnosis Date Noted   Aortic aneurysm ([place]) 06/04/2019   Fatty liver 06/04/2019   Aortic atherosclerosis ([place]) 06/04/2019   Acute prostatitis 02/21/2019   GERD (gastroesophageal reflux disease) 10/23/2018   Nasal congestion 10/23/2018   Primary osteoarthritis of left knee 02/27/2018   Elevated PSA 08/03/2016   Benign prostatic hypertrophy (BPH) with nocturia 05/27/2016   Lumbosacral radiculopathy 06/17/2015   Numbness 06/17/2015   Right leg numbness    Sinus bradycardia 03/19/2015   Acute posthemorrhagic anemia 10/17/2014   Diverticulosis of colon with hemorrhage    Gastrointestinal hemorrhage 10/15/2014   Actinic keratosis 08/07/2014   Elevated blood pressure 03/29/2014   Hyperglycemia 02/26/2013   OBESITY 03/20/2010   Generalized anxiety disorder 08/22/2008   ERECTILE DYSFUNCTION 08/22/2008   Hyperlipidemia 02/09/2007   Atherosclerosis of coronary artery 02/09/2007   Barrett's esophagus 02/09/2007   Diverticulosis of large intestine 02/09/2007    Past Surgical History:  Procedure Laterality Date   BASAL CELL CARCINOMA EXCISION     posterior right ear.    CATARACT EXTRACTION, BILATERAL     COLONOSCOPY N/A 01/26/2013   Procedure: COLONOSCOPY;  Surgeon: Milus Banister, MD;  Location: [place];  Service: Endoscopy;  Laterality: N/A;   CORONARY ANGIOPLASTY WITH STENT PLACEMENT  09/06/1998   LAD stent per Dr Elonda Husky in [facility]  point. 2 stents.    CYSTOSCOPY W/ URETERAL STENT PLACEMENT Left 12/21/2016   Procedure: CYSTOSCOPY WITH RETROGRADE PYELOGRAM/URETERAL STENT PLACEMENT;  Surgeon: Alexis Frock, MD;  Location: [facility];  Service: Urology;  Laterality: Left;   ESOPHAGOGASTRODUODENOSCOPY (EGD) WITH ESOPHAGEAL DILATION     "q time I have an EGD; usually q 3-4 years" (04/14/2015)   EXTRACORPOREAL SHOCK WAVE LITHOTRIPSY Left 12/30/2016   Procedure: LEFT EXTRACORPOREAL SHOCK WAVE LITHOTRIPSY (ESWL);  Surgeon: Carolan Clines, MD;  Location: [facility];   Service: Urology;  Laterality: Left;   LITHOTRIPSY  X 2   SQUAMOUS CELL CARCINOMA EXCISION     right forearm   TONSILLECTOMY  1971   TOTAL KNEE ARTHROPLASTY Left 03/21/2018   TOTAL KNEE ARTHROPLASTY Left 03/21/2018   Procedure: LEFT TOTAL KNEE ARTHROPLASTY;  Surgeon: Renette Butters, MD;  Location: [place];  Service: Orthopedics;  Laterality: Left;   UPPER GASTROINTESTINAL ENDOSCOPY     URETERAL STENT PLACEMENT     2019        Home Medications    Prior to Admission medications   Medication Sig Start Date End Date Taking? Authorizing Provider  aspirin EC 81 MG tablet Take 1 tablet (81 mg total) by mouth 2 (two) times daily. For DVT prophylaxis Patient taking differently: Take 81 mg by mouth daily. For DVT prophylaxis 03/21/18   Prudencio Burly III, PA-C  diclofenac sodium (VOLTAREN) 1 % GEL Apply 2 g topically 4 (four) times daily. Patient not taking: Reported on 07/07/2019 07/06/19   Hall-Potvin, [place], PA-C  dicyclomine (BENTYL) 10 MG capsule Take 1 capsule (10 mg total) by mouth 3 (three) times daily before meals. Patient not taking: Reported on 07/07/2019 04/25/19   Ladene Artist, MD  doxycycline (VIBRAMYCIN) 100 MG capsule Take 1 capsule (100 mg total) by mouth 2 (two) times daily. 07/07/19   Corena Herter, PA-C  HYDROcodone-acetaminophen (NORCO/VICODIN) 5-325 MG tablet Take 1-2 tablets by mouth every 4 (four) hours as needed for severe pain. 07/07/19   Corena Herter, PA-C  omeprazole (PRILOSEC) 40 MG capsule Take 1 capsule (40 mg total) by mouth daily. 04/04/19   Ladene Artist, MD  rosuvastatin (CRESTOR) 20 MG tablet TAKE 1 TABLET DAILY 01/03/19   Marin Olp, MD  tadalafil (ADCIRCA/CIALIS) 20 MG tablet TAKE ONE-HALF (1/2) TABLET DAILY AS NEEDED FOR ERECTILE DYSFUNCTION 01/03/19   Marin Olp, MD  traMADol (ULTRAM) 50 MG tablet Take 1-2 tablets (50-100 mg total) by mouth 2 (two) times daily as needed for severe pain. Post-operatively 06/04/19   Marin Olp, MD    Family History Family History  Problem Relation Age of Onset   Alcohol abuse Father    Liver disease Father    Heart disease Brother        CABG   Colon cancer Neg Hx     Social History Social History   Tobacco Use   Smoking status: Former Smoker    Packs/day: 1.00    Years: 30.00    Pack years: 30.00    Types: Cigarettes   Smokeless tobacco: Never Used   Tobacco comment: "quit smoking cigarettes in ~ 1990"  Substance Use Topics   Alcohol use: Yes    Comment: 04/14/2015 "might drink a couple beers/month"   Drug use: No     Allergies   Lipitor [atorvastatin]   Review of Systems Review of Systems  Constitutional: Positive for appetite change. Negative for fever.  HENT: Negative for congestion.   Respiratory:  Positive for cough and shortness of breath.   Gastrointestinal: Positive for diarrhea.  Genitourinary: Negative for flank pain.  Musculoskeletal: Negative for back pain.  Skin: Negative for rash.  Neurological: Positive for weakness.  Psychiatric/Behavioral: Negative for confusion.     Physical Exam Updated Vital Signs BP (!) 139/96    Pulse 80    Temp 99.1 F (37.3 C) (Oral)    Resp (!) 33    SpO2 91%   Physical Exam Vitals signs and nursing note reviewed.  HENT:     Head: Normocephalic.  Eyes:     General: No scleral icterus. Cardiovascular:     Rate and Rhythm: Normal rate.     Comments: Mildly harsh breath sounds.  Tachypnea.  Will desaturate with speech. Pulmonary:     Comments: Mildly harsh breath sounds.  Tachypnea.  Will desaturate with speech. Abdominal:     Tenderness: There is no abdominal tenderness.  Musculoskeletal:        General: No tenderness.  Skin:    General: Skin is warm.     Capillary Refill: Capillary refill takes less than 2 seconds.  Neurological:     Mental Status: He is alert. Mental status is at baseline.      ED Treatments / Results  Labs (all labs ordered are listed, but only  abnormal results are displayed) Labs Reviewed  CBC WITH DIFFERENTIAL/PLATELET - Abnormal; Notable for the following components:      Result Value   WBC 2.7 (*)    Platelets 93 (*)    Lymphs Abs 0.3 (*)    All other components within normal limits  COMPREHENSIVE METABOLIC PANEL - Abnormal; Notable for the following components:   Sodium 133 (*)    CO2 18 (*)    Glucose, Bld 116 (*)    Calcium 8.8 (*)    Albumin 3.4 (*)    AST 46 (*)    Total Bilirubin 1.3 (*)    All other components within normal limits  D-DIMER, QUANTITATIVE (NOT AT [facility]) - Abnormal; Notable for the following components:   D-Dimer, Quant 1.14 (*)    All other components within normal limits  LACTATE DEHYDROGENASE - Abnormal; Notable for the following components:   LDH 310 (*)    All other components within normal limits  FIBRINOGEN - Abnormal; Notable for the following components:   Fibrinogen 536 (*)    All other components within normal limits  CULTURE, BLOOD (ROUTINE X 2)  CULTURE, BLOOD (ROUTINE X 2)  LACTIC ACID, PLASMA  TRIGLYCERIDES  LACTIC ACID, PLASMA  PROCALCITONIN  C-REACTIVE PROTEIN  FERRITIN    EKG None  Radiology Dg Chest Port 1 View  Result Date: 07/09/2019 CLINICAL DATA:  History of COVID-19 positivity with shortness of breath EXAM: PORTABLE CHEST 1 VIEW COMPARISON:  07/07/2019 FINDINGS: Cardiac shadow is mildly prominent but accentuated by the portable technique. Aortic calcifications are noted. Patient is rotated to the right accentuating the mediastinal markings. Patchy increased interstitial markings are seen bilaterally particularly in the right mid lung and left lung base increased from the prior exam consistent with the patient's given clinical history. No sizable effusion is noted. No bony abnormality is seen. IMPRESSION: Patchy interstitial changes consistent with the patient's given clinical history Electronically Signed   By: Inez Catalina M.D.   On: 07/09/2019 19:02     Procedures Procedures (including critical care time)  Medications Ordered in ED Medications - No data to display   Initial Impression / Assessment and Plan /  ED Course  I have reviewed the triage vital signs and the nursing notes.  Pertinent labs & imaging results that were available during my care of the patient were reviewed by me and considered in my medical decision making (see chart for details).       Patient here with nausea vomiting and feeling more short of breath and fatigued.  Recently started on doxycycline for possible pneumonia but 2 days ago Covid test did come back positive.  Patient claims nausea and vomiting he thinks is from the doxycycline but also could be from the Covid.  He does get mildly hypoxic on room air down to 88%.  X-ray shows changes consistent with Covid infection.  Will admit to hospitalist.  RANIEL DIGGES was evaluated in Emergency Department on 07/09/2019 for the symptoms described in the history of present illness. He was evaluated in the context of the global COVID-19 pandemic, which necessitated consideration that the patient might be at risk for infection with the SARS-CoV-2 virus that causes COVID-19. Institutional protocols and algorithms that pertain to the evaluation of patients at risk for COVID-19 are in a state of rapid change based on information released by regulatory bodies including the CDC and federal and state organizations. These policies and algorithms were followed during the patient's care in the ED.  Final Clinical Impressions(s) / ED Diagnoses   Final diagnoses:  [id number]    ED Discharge Orders    None       Davonna Belling, MD 07/09/19 Lona Kettle    Davonna Belling, MD 07/23/19 [phone]

## 2019-07-10 ENCOUNTER — Encounter (HOSPITAL_COMMUNITY): Payer: Self-pay

## 2019-07-10 ENCOUNTER — Inpatient Hospital Stay (HOSPITAL_COMMUNITY): Payer: Self-pay

## 2019-07-10 ENCOUNTER — Other Ambulatory Visit: Payer: Self-pay

## 2019-07-10 DIAGNOSIS — U071 COVID-19: Principal | ICD-10-CM

## 2019-07-10 DIAGNOSIS — J9600 Acute respiratory failure, unspecified whether with hypoxia or hypercapnia: Secondary | ICD-10-CM

## 2019-07-10 LAB — COMPREHENSIVE METABOLIC PANEL
ALT: 23 U/L (ref 0–44)
AST: 37 U/L (ref 15–41)
Albumin: 3.2 g/dL — ABNORMAL LOW (ref 3.5–5.0)
Alkaline Phosphatase: 76 U/L (ref 38–126)
Anion gap: 13 (ref 5–15)
BUN: 9 mg/dL (ref 8–23)
CO2: 19 mmol/L — ABNORMAL LOW (ref 22–32)
Calcium: 8.7 mg/dL — ABNORMAL LOW (ref 8.9–10.3)
Chloride: 104 mmol/L (ref 98–111)
Creatinine, Ser: 1.04 mg/dL (ref 0.61–1.24)
GFR calc Af Amer: >60 mL/min (ref >60)
GFR calc non Af Amer: >60 mL/min (ref >60)
Glucose, Bld: 136 mg/dL — ABNORMAL HIGH (ref 70–99)
Potassium: 3.6 mmol/L (ref 3.5–5.1)
Sodium: 136 mmol/L (ref 135–145)
Total Bilirubin: 0.7 mg/dL (ref 0.3–1.2)
Total Protein: 6.5 g/dL (ref 6.5–8.1)

## 2019-07-10 LAB — CBC WITH DIFFERENTIAL/PLATELET
Abs Immature Granulocytes: 0.02 10*3/uL (ref 0.00–0.07)
Basophils Absolute: 0 10*3/uL (ref 0.0–0.1)
Basophils Relative: 0 %
Eosinophils Absolute: 0 10*3/uL (ref 0.0–0.5)
Eosinophils Relative: 0 %
HCT: 44.3 % (ref 39.0–52.0)
Hemoglobin: 15.1 g/dL (ref 13.0–17.0)
Immature Granulocytes: 1 %
Lymphocytes Relative: 19 %
Lymphs Abs: 0.3 10*3/uL — ABNORMAL LOW (ref 0.7–4.0)
MCH: 29.2 pg (ref 26.0–34.0)
MCHC: 34.1 g/dL (ref 30.0–36.0)
MCV: 85.5 fL (ref 80.0–100.0)
Monocytes Absolute: 0.2 10*3/uL (ref 0.1–1.0)
Monocytes Relative: 13 %
Neutro Abs: 1 10*3/uL — ABNORMAL LOW (ref 1.7–7.7)
Neutrophils Relative %: 67 %
Platelets: 94 10*3/uL — ABNORMAL LOW (ref 150–400)
RBC: 5.18 MIL/uL (ref 4.22–5.81)
RDW: 13.4 % (ref 11.5–15.5)
WBC: 1.5 10*3/uL — ABNORMAL LOW (ref 4.0–10.5)
nRBC: 0 % (ref 0.0–0.2)

## 2019-07-10 LAB — C-REACTIVE PROTEIN
CRP: 4.1 mg/dL — ABNORMAL HIGH (ref <1.0)
CRP: 5.8 mg/dL — ABNORMAL HIGH (ref <1.0)

## 2019-07-10 LAB — D-DIMER, QUANTITATIVE: D-Dimer, Quant: 1.27 ug/mL-FEU — ABNORMAL HIGH (ref 0.00–0.50)

## 2019-07-10 LAB — PROCALCITONIN: Procalcitonin: <0.1 ng/mL

## 2019-07-10 LAB — FERRITIN: Ferritin: 272 ng/mL (ref 24–336)

## 2019-07-10 MED ORDER — VITAMIN C 500 MG PO TABS
500.0000 mg | ORAL_TABLET | Freq: Every day | ORAL | Status: DC
  Administered 2019-07-10 – 2019-07-13 (×4): 500 mg via ORAL
  Filled 2019-07-10 (×4): qty 1

## 2019-07-10 MED ORDER — ZINC SULFATE 220 (50 ZN) MG PO CAPS
220.0000 mg | ORAL_CAPSULE | Freq: Every day | ORAL | Status: DC
  Administered 2019-07-10 – 2019-07-13 (×4): 220 mg via ORAL
  Filled 2019-07-10 (×4): qty 1

## 2019-07-10 NOTE — Progress Notes (Signed)
Patient seen this AM. Full note will follow

## 2019-07-10 NOTE — ED Notes (Signed)
ED TO INPATIENT HANDOFF REPORT    S Name/Age/Gender Jose [name] 78 y.o. male Room/Bed: 035C/035C  Code Status   Code Status: Full Code  Home/SNF/Other Home {Patient oriented x4 Is this baseline? yes  Triage Complete: Triage complete  Chief Complaint pneumonia, Covid-19  Triage Note Pt was here Saturday for Pneumonia. Tested then and is now here today with a Covid-19 positive result. Given Antibiotic on Saturday and felt ill with N/V/D with it.   VSS currently on RA   Allergies Allergies  Allergen Reactions  . Lipitor [Atorvastatin]     REACTION: muscle weakness    Level of Care/Admitting Diagnosis ED Disposition    ED Disposition Condition [facility] Area: [place] [[id number]]  Level of Care: Med-Surg [16]  Covid Evaluation: Confirmed COVID Positive  Diagnosis: Acute respiratory failure due to COVID-19 [id number]  Admitting Physician: Doreatha Massed  Attending Physician: Etta Quill [phone]  Estimated length of stay: past midnight tomorrow  Certification:: I certify this patient will need inpatient services for at least 2 midnights  PT Class (Do Not Modify): Inpatient [101]  PT Acc Code (Do Not Modify): Private [1]       B Medical/Surgery History Past Medical History:  Diagnosis Date  . Acute lower GI bleeding 01/25/2013; 09/2014   diverticular bleed  . Acute prostatitis 02/21/2019  . ANXIETY 08/22/2008  . Arthritis    "a little bit" (04/14/2015)  . Barrett's esophagus 2003   hematochezia as a result since 2003  . Basal cell cancer    removed from right forearm.   . Bradycardia 04/14/2015  . CORONARY ARTERY DISEASE    2 stents  . Depression   . DIVERTICULOSIS, COLON 2003  . ERECTILE DYSFUNCTION 08/22/2008  . GERD (gastroesophageal reflux disease)   . Heart murmur 04/14/2015   "they think I did; that's why I'm here" (04/14/2015)  . History of kidney stones   . HYPERLIPIDEMIA 02/09/2007  .  Kidney stones    "blasted or passed" (04/14/2015)  . MI (myocardial infarction) ([place]) 2000  . Numbness    right side - history of   . OBESITY 03/20/2010  . Squamous cell cancer of skin of earlobe    removed from area behind right earlobe  . Stroke [facility])    4 yrs ago possible mini stroke -  tests never found anything   Past Surgical History:  Procedure Laterality Date  . BASAL CELL CARCINOMA EXCISION     posterior right ear.   Marland Kitchen CATARACT EXTRACTION, BILATERAL    . COLONOSCOPY N/A 01/26/2013   Procedure: COLONOSCOPY;  Surgeon: Milus Banister, MD;  Location: [place];  Service: Endoscopy;  Laterality: N/A;  . CORONARY ANGIOPLASTY WITH STENT PLACEMENT  09/06/1998   LAD stent per Dr Elonda Husky in [place]. 2 stents.   . CYSTOSCOPY W/ URETERAL STENT PLACEMENT Left 12/21/2016   Procedure: CYSTOSCOPY WITH RETROGRADE PYELOGRAM/URETERAL STENT PLACEMENT;  Surgeon: Alexis Frock, MD;  Location: [facility];  Service: Urology;  Laterality: Left;  . ESOPHAGOGASTRODUODENOSCOPY (EGD) WITH ESOPHAGEAL DILATION     "q time I have an EGD; usually q 3-4 years" (04/14/2015)  . EXTRACORPOREAL SHOCK WAVE LITHOTRIPSY Left 12/30/2016   Procedure: LEFT EXTRACORPOREAL SHOCK WAVE LITHOTRIPSY (ESWL);  Surgeon: Carolan Clines, MD;  Location: [facility];  Service: Urology;  Laterality: Left;  . LITHOTRIPSY  X 2  . SQUAMOUS CELL CARCINOMA EXCISION     right forearm  . TONSILLECTOMY  1971  .  TOTAL KNEE ARTHROPLASTY Left 03/21/2018  . TOTAL KNEE ARTHROPLASTY Left 03/21/2018   Procedure: LEFT TOTAL KNEE ARTHROPLASTY;  Surgeon: Renette Butters, MD;  Location: [place];  Service: Orthopedics;  Laterality: Left;  . UPPER GASTROINTESTINAL ENDOSCOPY    . URETERAL STENT PLACEMENT     2019     A IV Location/Drains/Wounds Patient Lines/Drains/Airways Status   Active Line/Drains/Airways    Name:   Placement date:   Placement time:   Site:   Days:   Peripheral IV 07/09/19 Right Antecubital   07/09/19    1801    Antecubital   1    Ureteral Drain/Stent Left ureter 6 Fr.   12/21/16    1647    Left ureter   931   Incision (Closed) 03/21/18 Leg Left   03/21/18    1110     476          Intake/Output Last 24 hours No intake or output data in the 24 hours ending 07/10/19 0357  Labs/Imaging Results for orders placed or performed during the hospital encounter of 07/09/19 (from the past 48 hour(s))  Lactic acid, plasma     Status: None   Collection Time: 07/09/19  6:03 PM  Result Value Ref Range   Lactic Acid, Venous 1.4 0.5 - 1.9 mmol/L    Comment: Performed at [facility], [place] [address] [place], [place] [address]  CBC WITH DIFFERENTIAL     Status: Abnormal   Collection Time: 07/09/19  6:03 PM  Result Value Ref Range   WBC 2.7 (L) 4.0 - 10.5 K/uL   RBC 5.29 4.22 - 5.81 MIL/uL   Hemoglobin 15.3 13.0 - 17.0 g/dL   HCT 45.8 39.0 - 52.0 %   MCV 86.6 80.0 - 100.0 fL   MCH 28.9 26.0 - 34.0 pg   MCHC 33.4 30.0 - 36.0 g/dL   RDW 13.3 11.5 - 15.5 %   Platelets 93 (L) 150 - 400 K/uL    Comment: REPEATED TO VERIFY PLATELET COUNT CONFIRMED BY SMEAR Immature Platelet Fraction may be clinically indicated, consider ordering this additional test [id number]    nRBC 0.0 0.0 - 0.2 %   Neutrophils Relative % 66 %   Neutro Abs 1.8 1.7 - 7.7 K/uL   Lymphocytes Relative 12 %   Lymphs Abs 0.3 (L) 0.7 - 4.0 K/uL   Monocytes Relative 22 %   Monocytes Absolute 0.6 0.1 - 1.0 K/uL   Eosinophils Relative 0 %   Eosinophils Absolute 0.0 0.0 - 0.5 K/uL   Basophils Relative 0 %   Basophils Absolute 0.0 0.0 - 0.1 K/uL   Immature Granulocytes 0 %   Abs Immature Granulocytes 0.01 0.00 - 0.07 K/uL    Comment: Performed at [facility], [place] [address], [place], [place] [address]  Comprehensive metabolic panel     Status: Abnormal   Collection Time: 07/09/19  6:03 PM  Result Value Ref Range   Sodium 133 (L) 135 - 145 mmol/L   Potassium 3.7 3.5 - 5.1 mmol/L   Chloride 101 98 - 111 mmol/L   CO2 18 (L) 22 - 32 mmol/L    Glucose, Bld 116 (H) 70 - 99 mg/dL   BUN 11 8 - 23 mg/dL   Creatinine, Ser 1.07 0.61 - 1.24 mg/dL   Calcium 8.8 (L) 8.9 - 10.3 mg/dL   Total Protein 6.6 6.5 - 8.1 g/dL   Albumin 3.4 (L) 3.5 - 5.0 g/dL   AST 46 (H)  15 - 41 U/L   ALT 25 0 - 44 U/L   Alkaline Phosphatase 79 38 - 126 U/L   Total Bilirubin 1.3 (H) 0.3 - 1.2 mg/dL   GFR calc non Af Amer >60 >60 mL/min   GFR calc Af Amer >60 >60 mL/min   Anion gap 14 5 - 15    Comment: Performed at [facility] [address], [place], [place] [address]  D-dimer, quantitative     Status: Abnormal   Collection Time: 07/09/19  6:03 PM  Result Value Ref Range   D-Dimer, Quant 1.14 (H) 0.00 - 0.50 ug/mL-FEU    Comment: (NOTE) At the manufacturer cut-off of 0.50 ug/mL FEU, this assay has been documented to exclude PE with a sensitivity and negative predictive value of 97 to 99%.  At this time, this assay has not been approved by the FDA to exclude DVT/VTE. Results should be correlated with clinical presentation. Performed at [facility], [place] [address], [place], [place] [address]   Procalcitonin     Status: None   Collection Time: 07/09/19  6:03 PM  Result Value Ref Range   Procalcitonin <0.10 ng/mL    Comment:        Interpretation: PCT (Procalcitonin) <= 0.5 ng/mL: Systemic infection (sepsis) is not likely. Local bacterial infection is possible. (NOTE)       Sepsis PCT Algorithm           Lower Respiratory Tract                                      Infection PCT Algorithm    ----------------------------     ----------------------------         PCT < 0.25 ng/mL                PCT < 0.10 ng/mL         Strongly encourage             Strongly discourage   discontinuation of antibiotics    initiation of antibiotics    ----------------------------     -----------------------------       PCT 0.25 - 0.50 ng/mL            PCT 0.10 - 0.25 ng/mL               OR       >80% decrease in PCT            Discourage initiation of                                             antibiotics      Encourage discontinuation           of antibiotics    ----------------------------     -----------------------------         PCT >= 0.50 ng/mL              PCT 0.26 - 0.50 ng/mL               AND        <80% decrease in PCT             Encourage initiation of  antibiotics       Encourage continuation           of antibiotics    ----------------------------     -----------------------------        PCT >= 0.50 ng/mL                  PCT > 0.50 ng/mL               AND         increase in PCT                  Strongly encourage                                      initiation of antibiotics    Strongly encourage escalation           of antibiotics                                     -----------------------------                                           PCT <= 0.25 ng/mL                                                 OR                                        > 80% decrease in PCT                                     Discontinue / Do not initiate                                             antibiotics Performed at [facility], [address], [place], [place] [address]   Lactate dehydrogenase     Status: Abnormal   Collection Time: 07/09/19  6:03 PM  Result Value Ref Range   LDH 310 (H) 98 - 192 U/L    Comment: Performed at [facility] [address], [place], [place] [address]  Triglycerides     Status: None   Collection Time: 07/09/19  6:03 PM  Result Value Ref Range   Triglycerides 52 <150 mg/dL    Comment: Performed at [facility] [address] [place], [place] [address]  Fibrinogen     Status: Abnormal   Collection Time: 07/09/19  6:03 PM  Result Value Ref Range   Fibrinogen 536 (H) 210 - 475 mg/dL    Comment: Performed at [facility] [address], [place], [place] [address]  Lactic acid, plasma     Status: None   Collection Time: 07/09/19  10:51 PM  Result  Value Ref Range   Lactic Acid, Venous 1.3 0.5 - 1.9 mmol/L    Comment: Performed at [facility] [address], [place], [place] [address]  C-reactive protein     Status: Abnormal   Collection Time: 07/09/19 10:51 PM  Result Value Ref Range   CRP 4.1 (H) <1.0 mg/dL    Comment: Performed at [facility] [address] [place], [place] [address]  Ferritin     Status: None   Collection Time: 07/09/19 10:51 PM  Result Value Ref Range   Ferritin 272 24 - 336 ng/mL    Comment: Performed at [facility] [address], [place], [place] [address]  Procalcitonin     Status: None   Collection Time: 07/09/19 10:51 PM  Result Value Ref Range   Procalcitonin <0.10 ng/mL    Comment:        Interpretation: PCT (Procalcitonin) <= 0.5 ng/mL: Systemic infection (sepsis) is not likely. Local bacterial infection is possible. (NOTE)       Sepsis PCT Algorithm           Lower Respiratory Tract                                      Infection PCT Algorithm    ----------------------------     ----------------------------         PCT < 0.25 ng/mL                PCT < 0.10 ng/mL         Strongly encourage             Strongly discourage   discontinuation of antibiotics    initiation of antibiotics    ----------------------------     -----------------------------       PCT 0.25 - 0.50 ng/mL            PCT 0.10 - 0.25 ng/mL               OR       >80% decrease in PCT            Discourage initiation of                                            antibiotics      Encourage discontinuation           of antibiotics    ----------------------------     -----------------------------         PCT >= 0.50 ng/mL              PCT 0.26 - 0.50 ng/mL               AND        <80% decrease in PCT             Encourage initiation of                                             antibiotics       Encourage continuation           of antibiotics    ----------------------------      -----------------------------  PCT >= 0.50 ng/mL                  PCT > 0.50 ng/mL               AND         increase in PCT                  Strongly encourage                                      initiation of antibiotics    Strongly encourage escalation           of antibiotics                                     -----------------------------                                           PCT <= 0.25 ng/mL                                                 OR                                        > 80% decrease in PCT                                     Discontinue / Do not initiate                                             antibiotics Performed at [facility], [address], [place], [place] [address]    Dg Chest Port 1 View  Result Date: 07/09/2019 CLINICAL DATA:  History of COVID-19 positivity with shortness of breath EXAM: PORTABLE CHEST 1 VIEW COMPARISON:  07/07/2019 FINDINGS: Cardiac shadow is mildly prominent but accentuated by the portable technique. Aortic calcifications are noted. Patient is rotated to the right accentuating the mediastinal markings. Patchy increased interstitial markings are seen bilaterally particularly in the right mid lung and left lung base increased from the prior exam consistent with the patient's given clinical history. No sizable effusion is noted. No bony abnormality is seen. IMPRESSION: Patchy interstitial changes consistent with the patient's given clinical history Electronically Signed   By: Inez Catalina M.D.   On: 07/09/2019 19:02    Pending Labs Unresulted Labs (From admission, onward)    Start     Ordered   07/10/19 0500  CBC with Differential/Platelet  Daily,   R     07/09/19 2007   07/10/19 0500  Comprehensive metabolic panel  Daily,   R     07/09/19 2007   07/10/19 0500  C-reactive protein  Daily,   R     07/09/19 2007  07/10/19 0500  D-dimer, quantitative (not at [facility])  Daily,   R     07/09/19 2007   07/09/19 1755  Blood  Culture (routine x 2)  BLOOD CULTURE X 2,   STAT     07/09/19 1755          Vitals/Pain Today's Vitals   07/10/19 0249 07/10/19 0300 07/10/19 0326 07/10/19 0330  BP: 117/72 122/70  134/72  Pulse: 78 (!) 59  (!) 53  Resp: 18 (!) 32  (!) 22  Temp:      TempSrc:      SpO2: 94% 92%  92%  Weight:      Height:      PainSc: 0-No pain  0-No pain     Isolation Precautions Airborne and Contact precautions  Medications Medications  aspirin EC tablet 81 mg (81 mg Oral Not Given 07/10/19 0243)  pantoprazole (PROTONIX) EC tablet 80 mg (has no administration in time range)  rosuvastatin (CRESTOR) tablet 20 mg (has no administration in time range)  enoxaparin (LOVENOX) injection 50 mg (50 mg Subcutaneous Refused 07/09/19 2148)  albuterol (VENTOLIN HFA) 108 (90 Base) MCG/ACT inhaler 2 puff (2 puffs Inhalation Given 07/10/19 0243)  dexamethasone (DECADRON) tablet 6 mg (6 mg Oral Given 07/09/19 2135)  ondansetron (ZOFRAN) tablet 4 mg (has no administration in time range)    Or  ondansetron (ZOFRAN) injection 4 mg (has no administration in time range)  acetaminophen (TYLENOL) tablet 650 mg (has no administration in time range)  guaiFENesin-dextromethorphan (ROBITUSSIN DM) 100-10 MG/5ML syrup 10 mL (has no administration in time range)  chlorpheniramine-HYDROcodone (TUSSIONEX) 10-8 MG/5ML suspension 5 mL (has no administration in time range)  remdesivir 200 mg in sodium chloride 0.9 % 250 mL IVPB (0 mg Intravenous Stopped 07/09/19 2222)    Followed by  remdesivir 100 mg in sodium chloride 0.9 % 250 mL IVPB (has no administration in time range)    Mobility walks Low fall risk   Focused Assessments Respirations unlabored , denies pain , 4 lpm/[place]   R Recommendations: See Admitting Provider Note  Report given to:   Additional Notes:

## 2019-07-10 NOTE — ED Notes (Signed)
Lunch Tray Ordered @ 1102. 

## 2019-07-10 NOTE — ED Notes (Signed)
[facility]ed [facility] for pt transport to [facility] spoke with thomas.

## 2019-07-10 NOTE — ED Notes (Signed)
Admitting MD ( Dr. Fabio Neighbors) notified on patient's elevated D-dimer result.

## 2019-07-10 NOTE — ED Notes (Signed)
GVC/MS   Breakfast ordered

## 2019-07-10 NOTE — ED Notes (Signed)
Canceled [facility] transport, called [facility] for pt transport.

## 2019-07-10 NOTE — Progress Notes (Signed)
PROGRESS NOTE    Jose [name]  [id number] DOB: July 10, 1941 DOA: 07/09/2019 PCP: Marin Olp, MD    Brief Narrative:  78 y.o. male with medical history significant of Barrett's esophagus, diverticular bleed.  Patient seen in ED x2 days ago with headache, flank pain.  CT stone study: Neg for Stone, did show some scattered ground glass opacities in lower lungs, non-specific.  Found to have PNA.  Covid testing performed, started on doxycycline.  Returns to ED today now with N/V/D he attributes to the doxycycline.  Also increased SOB.  Assessment & Plan:   Principal Problem:   Acute respiratory failure due to COVID-19 ([place])  1. New O2 requirement due to COVID-19 PNA - 1. Presented hypoxemic with pos CXR findings 2. Currently on remdesivir and dexamethasone  3. Will start vic c and zinc 4. Wean O2 as tolerated 5. Continue to wean O2 as tolerated 6. D-dimer 1.27, CRP 5.8 7. Cardiomegaly noted on CXR. Will check 2d echo 2. H/o diverticular bleed - 1. No evidence of acute blood loss 2. Repeat cbc in AM 3. H/o Barrett's esophagus - 1. Q3YR EGDs 2. Continue PPI as tolerated 4. H/o small infrarenal AAA - 1. Next imaging reportedly due in 2023 2. Seems stable at this time  DVT prophylaxis: Lovenox subQ Code Status: Full Family Communication: Pt in room, family not at bedside Disposition Plan: Uncertain at this time  Consultants:     Procedures:     Antimicrobials: Anti-infectives (From admission, onward)   Start     Dose/Rate Route Frequency Ordered Stop   07/10/19 1600  remdesivir 100 mg in sodium chloride 0.9 % 250 mL IVPB     100 mg 500 mL/hr over 30 Minutes Intravenous Every 24 hours 07/09/19 2017 07/14/19 1559   07/09/19 2100  remdesivir 200 mg in sodium chloride 0.9 % 250 mL IVPB     200 mg 500 mL/hr over 30 Minutes Intravenous Once 07/09/19 2017 07/09/19 2222       Subjective: Claims to be feeling well and asking about gong home  despite being on 4LNC this AM  Objective: Vitals:   07/10/19 1100 07/10/19 1230 07/10/19 1422 07/10/19 1437  BP: 122/82 132/78  124/73  Pulse: 62 (!) 56  (!) 58  Resp: (!) 23 (!) 31  (!) 21  Temp:    98.1 F (36.7 C)  TempSrc:   Oral Oral  SpO2: 93% 90%  93%  Weight:      Height:       No intake or output data in the 24 hours ending 07/10/19 1459 Filed Weights   07/09/19 2015  Weight: 98.4 kg    Examination:  General exam: Appears calm and comfortable  Respiratory system: No auditble wheezing. Respiratory effort normal. Cardiovascular system: S1 & S2 heard, regular Gastrointestinal system: Abdomen is nondistended, soft and nontender. No organomegaly or masses felt. Normal bowel sounds heard. Central nervous system: Alert and oriented. No focal neurological deficits. Extremities: Symmetric 5 x 5 power. Skin: No rashes, lesions  Psychiatry: Judgement and insight appear normal. Mood & affect appropriate.   Data Reviewed: I have personally reviewed following labs and imaging studies  CBC: Recent Labs  Lab 07/07/19 1020 07/09/19 1803 07/10/19 0448  WBC 1.8* 2.7* 1.5*  NEUTROABS 0.8* 1.8 1.0*  HGB 15.1 15.3 15.1  HCT 45.1 45.8 44.3  MCV 87.4 86.6 85.5  PLT 108* 93* 94*   Basic Metabolic Panel: Recent Labs  Lab 07/07/19 1020 07/09/19 1803 07/10/19 0448  NA 133* 133* 136  K 3.5 3.7 3.6  CL 103 101 104  CO2 21* 18* 19*  GLUCOSE 93 116* 136*  BUN 8 11 9   CREATININE 0.95 1.07 1.04  CALCIUM 8.4* 8.8* 8.7*   GFR: Estimated Creatinine Clearance: 67.4 mL/min (by C-G formula based on SCr of 1.04 mg/dL). Liver Function Tests: Recent Labs  Lab 07/07/19 1020 07/09/19 1803 07/10/19 0448  AST 30 46* 37  ALT 21 25 23   ALKPHOS 85 79 76  BILITOT 0.6 1.3* 0.7  PROT 6.6 6.6 6.5  ALBUMIN 3.4* 3.4* 3.2*   Recent Labs  Lab 07/07/19 1020  LIPASE 32   No results for input(s): AMMONIA in the last 168 hours. Coagulation Profile: No results for input(s): INR, PROTIME  in the last 168 hours. Cardiac Enzymes: No results for input(s): CKTOTAL, CKMB, CKMBINDEX, TROPONINI in the last 168 hours. BNP (last 3 results) No results for input(s): PROBNP in the last 8760 hours. HbA1C: No results for input(s): HGBA1C in the last 72 hours. CBG: No results for input(s): GLUCAP in the last 168 hours. Lipid Profile: Recent Labs    07/09/19 1803  TRIG 52   Thyroid Function Tests: No results for input(s): TSH, T4TOTAL, FREET4, T3FREE, THYROIDAB in the last 72 hours. Anemia Panel: Recent Labs    07/09/19 2251  FERRITIN 272   Sepsis Labs: Recent Labs  Lab 07/09/19 1803 07/09/19 2251  PROCALCITON <0.10 <0.10  LATICACIDVEN 1.4 1.3    Recent Results (from the past 240 hour(s))  SARS CORONAVIRUS 2 (TAT 6-24 HRS) Nasopharyngeal Nasopharyngeal Swab     Status: Abnormal   Collection Time: 07/07/19  4:19 PM   Specimen: Nasopharyngeal Swab  Result Value Ref Range Status   SARS Coronavirus 2 POSITIVE (A) NEGATIVE Final    Comment: RESULT CALLED TO, READ BACK BY AND VERIFIED WITH: EMAILED TO Aretta Nip [id number] 07/08/2019 T. TYSOR (NOTE) SARS-CoV-2 target nucleic acids are DETECTED. The SARS-CoV-2 RNA is generally detectable in upper and lower respiratory specimens during the acute phase of infection. Positive results are indicative of active infection with SARS-CoV-2. Clinical  correlation with patient history and other diagnostic information is necessary to determine patient infection status. Positive results do  not rule out bacterial infection or co-infection with other viruses. The expected result is Negative. Fact Sheet for Patients: SugarRoll.be Fact Sheet for Healthcare Providers: https://www.woods-mathews.com/ This test is not yet approved or cleared by the [place] FDA and  has been authorized for detection and/or diagnosis of SARS-CoV-2 by FDA under an Emergency Use Authorization (EUA). This EUA will  remain  in effect (meaning this test can be  used) for the duration of the COVID-19 declaration under Section 564(b)(1) of the Act, 21 U.S.C. section 360bbb-3(b)(1), unless the authorization is terminated or revoked sooner. Performed at [facility], [place] [address], [place], [place] [address]   Blood Culture (routine x 2)     Status: None (Preliminary result)   Collection Time: 07/09/19  6:03 PM   Specimen: BLOOD  Result Value Ref Range Status   Specimen Description BLOOD RIGHT ANTECUBITAL  Final   Special Requests   Final    BOTTLES DRAWN AEROBIC AND ANAEROBIC Blood Culture results may not be optimal due to an inadequate volume of blood received in culture bottles   Culture   Final    NO GROWTH < 12 HOURS Performed at [facility], [place] [address], [place], [place] [address]    Report Status PENDING  Incomplete  Blood Culture (routine x 2)     Status: None (Preliminary result)   Collection Time: 07/09/19  6:48 PM   Specimen: BLOOD  Result Value Ref Range Status   Specimen Description BLOOD LEFT ANTECUBITAL  Final   Special Requests   Final    BOTTLES DRAWN AEROBIC AND ANAEROBIC Blood Culture results may not be optimal due to an inadequate volume of blood received in culture bottles   Culture   Final    NO GROWTH < 12 HOURS Performed at [facility], [place] [address], [place], [place] [address]    Report Status PENDING  Incomplete     Radiology Studies: Dg Chest Port 1 View  Result Date: 07/09/2019 CLINICAL DATA:  History of COVID-19 positivity with shortness of breath EXAM: PORTABLE CHEST 1 VIEW COMPARISON:  07/07/2019 FINDINGS: Cardiac shadow is mildly prominent but accentuated by the portable technique. Aortic calcifications are noted. Patient is rotated to the right accentuating the mediastinal markings. Patchy increased interstitial markings are seen bilaterally particularly in the right mid lung and left lung base increased from the prior exam consistent  with the patient's given clinical history. No sizable effusion is noted. No bony abnormality is seen. IMPRESSION: Patchy interstitial changes consistent with the patient's given clinical history Electronically Signed   By: Inez Catalina M.D.   On: 07/09/2019 19:02    Scheduled Meds: . albuterol  2 puff Inhalation Q6H  . aspirin EC  81 mg Oral Daily  . dexamethasone  6 mg Oral Q24H  . enoxaparin (LOVENOX) injection  50 mg Subcutaneous Q24H  . pantoprazole  80 mg Oral Daily  . rosuvastatin  20 mg Oral Daily   Continuous Infusions: . remdesivir 100 mg in NS 250 mL       LOS: 1 day   Marylu Lund, MD [facility] Pager On Amion  If 7PM-7AM, please contact night-coverage 07/10/2019, 2:59 PM

## 2019-07-11 ENCOUNTER — Inpatient Hospital Stay (HOSPITAL_COMMUNITY): Payer: Medicare Other

## 2019-07-11 DIAGNOSIS — I5031 Acute diastolic (congestive) heart failure: Secondary | ICD-10-CM

## 2019-07-11 LAB — COMPREHENSIVE METABOLIC PANEL
ALT: 30 U/L (ref 0–44)
AST: 46 U/L — ABNORMAL HIGH (ref 15–41)
Albumin: 3.2 g/dL — ABNORMAL LOW (ref 3.5–5.0)
Alkaline Phosphatase: 70 U/L (ref 38–126)
Anion gap: 8 (ref 5–15)
BUN: 19 mg/dL (ref 8–23)
CO2: 22 mmol/L (ref 22–32)
Calcium: 8.7 mg/dL — ABNORMAL LOW (ref 8.9–10.3)
Chloride: 107 mmol/L (ref 98–111)
Creatinine, Ser: 0.77 mg/dL (ref 0.61–1.24)
GFR calc Af Amer: >60 mL/min (ref >60)
GFR calc non Af Amer: >60 mL/min (ref >60)
Glucose, Bld: 119 mg/dL — ABNORMAL HIGH (ref 70–99)
Potassium: 3.7 mmol/L (ref 3.5–5.1)
Sodium: 137 mmol/L (ref 135–145)
Total Bilirubin: 0.9 mg/dL (ref 0.3–1.2)
Total Protein: 6.3 g/dL — ABNORMAL LOW (ref 6.5–8.1)

## 2019-07-11 LAB — CBC WITH DIFFERENTIAL/PLATELET
Abs Immature Granulocytes: 0.01 10*3/uL (ref 0.00–0.07)
Basophils Absolute: 0 10*3/uL (ref 0.0–0.1)
Basophils Relative: 0 %
Eosinophils Absolute: 0 10*3/uL (ref 0.0–0.5)
Eosinophils Relative: 0 %
HCT: 43 % (ref 39.0–52.0)
Hemoglobin: 14.3 g/dL (ref 13.0–17.0)
Immature Granulocytes: 0 %
Lymphocytes Relative: 16 %
Lymphs Abs: 0.4 10*3/uL — ABNORMAL LOW (ref 0.7–4.0)
MCH: 28.5 pg (ref 26.0–34.0)
MCHC: 33.3 g/dL (ref 30.0–36.0)
MCV: 85.8 fL (ref 80.0–100.0)
Monocytes Absolute: 0.5 10*3/uL (ref 0.1–1.0)
Monocytes Relative: 20 %
Neutro Abs: 1.6 10*3/uL — ABNORMAL LOW (ref 1.7–7.7)
Neutrophils Relative %: 64 %
Platelets: 104 10*3/uL — ABNORMAL LOW (ref 150–400)
RBC: 5.01 MIL/uL (ref 4.22–5.81)
RDW: 13.6 % (ref 11.5–15.5)
WBC: 2.5 10*3/uL — ABNORMAL LOW (ref 4.0–10.5)
nRBC: 0 % (ref 0.0–0.2)

## 2019-07-11 LAB — ECHOCARDIOGRAM COMPLETE
Height: 66 in
Weight: 3472 oz

## 2019-07-11 LAB — D-DIMER, QUANTITATIVE: D-Dimer, Quant: 1.09 ug/mL-FEU — ABNORMAL HIGH (ref 0.00–0.50)

## 2019-07-11 LAB — C-REACTIVE PROTEIN: CRP: 4.5 mg/dL — ABNORMAL HIGH (ref <1.0)

## 2019-07-11 NOTE — Progress Notes (Signed)
Pt requesting to have his wife drop off a toothbrush, toothpaste and mouthwash due to the fact that he "does not like the products the hospital provides". Pt educated on drop off times from 10-2pm during the day. Pt states that he was told until 730pm and he was "tired of people telling him one thing and doing another". Pt also stated that he was "pissed off and tired of the bullshit.". Pt stated that he "hates this place and his wife is 67 and he doesn't want her to get covid". Pt yelling at this RN during this incident. Pt educated on policy for drop offs but that the CN would probably make an exception since he was told the wrong thing. Verified with CN and educated pt that he may have his wife drop off his needed belongings today. Pt verbalizes understanding.

## 2019-07-11 NOTE — Progress Notes (Signed)
PROGRESS NOTE  Jose [name]  [id number] DOB: 06/07/1941 DOA: 07/09/2019 PCP: Marin Olp, MD   Brief Narrative: Jose [name] is a 78 y.o. male with a history of Barrett's esophagus and diverticular hemorrhage who returned to the ED 11/9 for increasing dyspnea, nausea and vomiting. He had been evaluated for flank pain in the ED 2 days prior and was sent home with doxycycline for possible atypical pneumonia with scattered GGOs noted at the lung bases on CT stone study which ruled out radiopaque stone. Since that time, he'd grown weaker, having vomiting and shortness of breath. Test that was pending in the ED returned positive for covid-19 and he returned to the ED where he had bilateral infiltrates, hypoxia, and was admitted to be given remdesivir, steroids, and supplemental oxygen.   Assessment & Plan: Principal Problem:   Acute respiratory failure due to COVID-19 [facility])  Acute hypoxic respiratory failure due to covid-19 pneumonia:  - Continue remdesivir 11/9 - 11/13 (PM dosing) - Continue steroids. CRP making modest improvements. PCT negative.  - Continue airborne, contact precautions. PPE including surgical gown, gloves, cap, shoe covers, and CAPR used during this encounter in a negative pressure room.  - Trend inflammatory markers, monitor LFTs.  - Avoid NSAIDs - Recommend proning and aggressive use of incentive spirometry.  History of Barrett's esophagus - Continue PPI - Outpatient follow up for q3y EGD  History of diverticular hemorrhage: No evidence of GIB. - Monitor CBC.   Hyperlipidemia:  - Continue statin. ALT wnl.  Obesity: BMI 35. Noted  Nausea and vomiting: Improving. ?if due to doxycycline or covid. Will avoid doxycycline regardless.  - Continue symptomatic management.  AAA:  - Next imaging reportedly due 2023.   Thrombocytopenia: Acute, mild, likely due to covid.  - Continue monitoring. Continue DVT ppx.   Diastolic dysfunction: No clinical  evidence of CHF at this time.  - Control BP. No indication for diuresis.   DVT prophylaxis: Continue lovenox 0.5mg /kg q24h Code Status: Full Family Communication: None at bedside Disposition Plan: Home pending clinical course after completion of remdesivir  Consultants:   None  Procedures: Echocardiogram 07/11/2019 1. Left ventricular ejection fraction, by visual estimation, is 60 to 65%. The left ventricle has normal function. Left ventricular septal wall thickness was mildly increased. Mildly increased left ventricular posterior wall thickness. There is mildly  increased left ventricular hypertrophy.  2. Left ventricular diastolic parameters are consistent with Grade I diastolic dysfunction (impaired relaxation).  3. Global right ventricle has normal systolic function.The right ventricular size is normal. No increase in right ventricular wall thickness.  4. Left atrial size was mildly dilated.  5. Right atrial size was normal.  6. The mitral valve is normal in structure. Trace mitral valve regurgitation. No evidence of mitral stenosis.  7. The tricuspid valve is normal in structure. Tricuspid valve regurgitation is mild.  8. The aortic valve is tricuspid. Aortic valve regurgitation is trivial. No evidence of aortic valve sclerosis or stenosis.  9. The pulmonic valve was normal in structure. Pulmonic valve regurgitation is not visualized. 10. Normal pulmonary artery systolic pressure. 11. The inferior vena cava is normal in size with greater than 50% respiratory variability, suggesting right atrial pressure of 3 mmHg.  Antimicrobials:  Remdesivir 11/9 - 11/13   Subjective: Breath is fine, only mildly short of breath on exertion. Feels steady on his feet. Nausea and vomiting are improving and he's able to eat some. Degree of hypoxia seems to exceed dyspnea.  Objective: Vitals:  07/10/19 1700 07/10/19 1943 07/11/19 0617 07/11/19 0825  BP: 115/65 126/74 105/84 138/75  Pulse: 67 64  (!) 56 (!) 57  Resp: 18 19 18 18   Temp: 97.8 F (36.6 C) (!) 97.4 F (36.3 C) (!) 97.4 F (36.3 C) 97.7 F (36.5 C)  TempSrc: Oral Oral Oral Oral  SpO2: 91% 92% 91% 94%  Weight:      Height:        Intake/Output Summary (Last 24 hours) at 07/11/2019 1347 Last data filed at 07/11/2019 1051 Gross per 24 hour  Intake 730 ml  Output 4 ml  Net 726 ml   Filed Weights   07/09/19 2015  Weight: 98.4 kg    Gen: 78 y.o. male in no distress Pulm: Non-labored breathing supplemental oxygen. Clear to auscultation bilaterally.  CV: Regular rate and rhythm. No murmur, rub, or gallop. No JVD, no significant pedal edema. GI: Abdomen soft, non-tender, non-distended, with normoactive bowel sounds. No organomegaly or masses felt. Ext: Warm, no deformities Skin: No rashes, lesions or ulcers Neuro: Alert and oriented. No focal neurological deficits. Psych: Judgement and insight appear normal. Mood & affect appropriate.   Data Reviewed: I have personally reviewed following labs and imaging studies  CBC: Recent Labs  Lab 07/07/19 1020 07/09/19 1803 07/10/19 0448 07/11/19 0120  WBC 1.8* 2.7* 1.5* 2.5*  NEUTROABS 0.8* 1.8 1.0* 1.6*  HGB 15.1 15.3 15.1 14.3  HCT 45.1 45.8 44.3 43.0  MCV 87.4 86.6 85.5 85.8  PLT 108* 93* 94* [id number]*   Basic Metabolic Panel: Recent Labs  Lab 07/07/19 1020 07/09/19 1803 07/10/19 0448 07/11/19 0120  NA 133* 133* 136 137  K 3.5 3.7 3.6 3.7  CL 103 101 104 107  CO2 21* 18* 19* 22  GLUCOSE 93 116* 136* 119*  BUN 8 11 9 19   CREATININE 0.95 1.07 1.04 0.77  CALCIUM 8.4* 8.8* 8.7* 8.7*   GFR: Estimated Creatinine Clearance: 87.6 mL/min (by C-G formula based on SCr of 0.77 mg/dL). Liver Function Tests: Recent Labs  Lab 07/07/19 1020 07/09/19 1803 07/10/19 0448 07/11/19 0120  AST 30 46* 37 46*  ALT 21 25 23 30   ALKPHOS 85 79 76 70  BILITOT 0.6 1.3* 0.7 0.9  PROT 6.6 6.6 6.5 6.3*  ALBUMIN 3.4* 3.4* 3.2* 3.2*   Recent Labs  Lab 07/07/19 1020   LIPASE 32   No results for input(s): AMMONIA in the last 168 hours. Coagulation Profile: No results for input(s): INR, PROTIME in the last 168 hours. Cardiac Enzymes: No results for input(s): CKTOTAL, CKMB, CKMBINDEX, TROPONINI in the last 168 hours. BNP (last 3 results) No results for input(s): PROBNP in the last 8760 hours. HbA1C: No results for input(s): HGBA1C in the last 72 hours. CBG: No results for input(s): GLUCAP in the last 168 hours. Lipid Profile: Recent Labs    07/09/19 1803  TRIG 52   Thyroid Function Tests: No results for input(s): TSH, T4TOTAL, FREET4, T3FREE, THYROIDAB in the last 72 hours. Anemia Panel: Recent Labs    07/09/19 2251  FERRITIN 272   Urine analysis:    Component Value Date/Time   COLORURINE YELLOW 07/07/2019 1021   APPEARANCEUR HAZY (A) 07/07/2019 1021   LABSPEC 1.026 07/07/2019 1021   PHURINE 5.0 07/07/2019 1021   GLUCOSEU NEGATIVE 07/07/2019 1021   GLUCOSEU NEGATIVE 04/18/2009 0808   HGBUR NEGATIVE 07/07/2019 1021   HGBUR 2+ 02/27/2008 [place] 07/07/2019 1021   BILIRUBINUR small (A) 07/06/2019 [place] 07/07/2019  1021   PROTEINUR 30 (A) 07/07/2019 1021   UROBILINOGEN 1.0 07/06/2019 1959   UROBILINOGEN 1.0 04/14/2015 1359   NITRITE NEGATIVE 07/07/2019 1021   LEUKOCYTESUR NEGATIVE 07/07/2019 1021   Recent Results (from the past 240 hour(s))  SARS CORONAVIRUS 2 (TAT 6-24 HRS) Nasopharyngeal Nasopharyngeal Swab     Status: Abnormal   Collection Time: 07/07/19  4:19 PM   Specimen: Nasopharyngeal Swab  Result Value Ref Range Status   SARS Coronavirus 2 POSITIVE (A) NEGATIVE Final    Comment: RESULT CALLED TO, READ BACK BY AND VERIFIED WITH: EMAILED TO Aretta Nip [id number] 07/08/2019 T. TYSOR (NOTE) SARS-CoV-2 target nucleic acids are DETECTED. The SARS-CoV-2 RNA is generally detectable in upper and lower respiratory specimens during the acute phase of infection. Positive results are indicative of  active infection with SARS-CoV-2. Clinical  correlation with patient history and other diagnostic information is necessary to determine patient infection status. Positive results do  not rule out bacterial infection or co-infection with other viruses. The expected result is Negative. Fact Sheet for Patients: SugarRoll.be Fact Sheet for Healthcare Providers: https://www.woods-mathews.com/ This test is not yet approved or cleared by the [place] FDA and  has been authorized for detection and/or diagnosis of SARS-CoV-2 by FDA under an Emergency Use Authorization (EUA). This EUA will remain  in effect (meaning this test can be  used) for the duration of the COVID-19 declaration under Section 564(b)(1) of the Act, 21 U.S.C. section 360bbb-3(b)(1), unless the authorization is terminated or revoked sooner. Performed at [facility], [place] [address] [place], [place] [address]   Blood Culture (routine x 2)     Status: None (Preliminary result)   Collection Time: 07/09/19  6:03 PM   Specimen: BLOOD  Result Value Ref Range Status   Specimen Description BLOOD RIGHT ANTECUBITAL  Final   Special Requests   Final    BOTTLES DRAWN AEROBIC AND ANAEROBIC Blood Culture results may not be optimal due to an inadequate volume of blood received in culture bottles   Culture   Final    NO GROWTH 2 DAYS Performed at [facility], [place] [address], [place], [place] [address]    Report Status PENDING  Incomplete  Blood Culture (routine x 2)     Status: None (Preliminary result)   Collection Time: 07/09/19  6:48 PM   Specimen: BLOOD  Result Value Ref Range Status   Specimen Description BLOOD LEFT ANTECUBITAL  Final   Special Requests   Final    BOTTLES DRAWN AEROBIC AND ANAEROBIC Blood Culture results may not be optimal due to an inadequate volume of blood received in culture bottles   Culture   Final    NO GROWTH 2 DAYS Performed at [facility], [place] [address] [place],  [address]    Report Status PENDING  Incomplete      Radiology Studies: Dg Chest Port 1 View  Result Date: 07/09/2019 CLINICAL DATA:  History of COVID-19 positivity with shortness of breath EXAM: PORTABLE CHEST 1 VIEW COMPARISON:  07/07/2019 FINDINGS: Cardiac shadow is mildly prominent but accentuated by the portable technique. Aortic calcifications are noted. Patient is rotated to the right accentuating the mediastinal markings. Patchy increased interstitial markings are seen bilaterally particularly in the right mid lung and left lung base increased from the prior exam consistent with the patient's given clinical history. No sizable effusion is noted. No bony abnormality is seen. IMPRESSION: Patchy interstitial changes consistent with the patient's given clinical  history Electronically Signed   By: Inez Catalina M.D.   On: 07/09/2019 19:02    Scheduled Meds: . albuterol  2 puff Inhalation Q6H  . aspirin EC  81 mg Oral Daily  . dexamethasone  6 mg Oral Q24H  . enoxaparin (LOVENOX) injection  50 mg Subcutaneous Q24H  . pantoprazole  80 mg Oral Daily  . rosuvastatin  20 mg Oral Daily  . vitamin C  500 mg Oral Daily  . zinc sulfate  220 mg Oral Daily   Continuous Infusions: . remdesivir 100 mg in NS 250 mL 100 mg (07/10/19 1719)     LOS: 2 days   Time spent: 25 minutes.  Patrecia Pour, MD [facility] [email or web address] 07/11/2019, 1:47 PM

## 2019-07-11 NOTE — Progress Notes (Signed)
  Echocardiogram 2D Echocardiogram has been performed.  Darlina Sicilian M 07/11/2019, 8:52 AM

## 2019-07-12 DIAGNOSIS — K2270 Barrett's esophagus without dysplasia: Secondary | ICD-10-CM

## 2019-07-12 DIAGNOSIS — R7989 Other specified abnormal findings of blood chemistry: Secondary | ICD-10-CM

## 2019-07-12 DIAGNOSIS — E785 Hyperlipidemia, unspecified: Secondary | ICD-10-CM

## 2019-07-12 DIAGNOSIS — R0981 Nasal congestion: Secondary | ICD-10-CM

## 2019-07-12 DIAGNOSIS — K219 Gastro-esophageal reflux disease without esophagitis: Secondary | ICD-10-CM

## 2019-07-12 DIAGNOSIS — E669 Obesity, unspecified: Secondary | ICD-10-CM | POA: Diagnosis present

## 2019-07-12 DIAGNOSIS — D696 Thrombocytopenia, unspecified: Secondary | ICD-10-CM

## 2019-07-12 DIAGNOSIS — J9601 Acute respiratory failure with hypoxia: Secondary | ICD-10-CM | POA: Diagnosis present

## 2019-07-12 DIAGNOSIS — K760 Fatty (change of) liver, not elsewhere classified: Secondary | ICD-10-CM

## 2019-07-12 LAB — CBC WITH DIFFERENTIAL/PLATELET
Abs Immature Granulocytes: 0.03 10*3/uL (ref 0.00–0.07)
Basophils Absolute: 0 10*3/uL (ref 0.0–0.1)
Basophils Relative: 0 %
Eosinophils Absolute: 0 10*3/uL (ref 0.0–0.5)
Eosinophils Relative: 0 %
HCT: 43.4 % (ref 39.0–52.0)
Hemoglobin: 14.6 g/dL (ref 13.0–17.0)
Immature Granulocytes: 1 %
Lymphocytes Relative: 10 %
Lymphs Abs: 0.4 10*3/uL — ABNORMAL LOW (ref 0.7–4.0)
MCH: 29 pg (ref 26.0–34.0)
MCHC: 33.6 g/dL (ref 30.0–36.0)
MCV: 86.1 fL (ref 80.0–100.0)
Monocytes Absolute: 0.5 10*3/uL (ref 0.1–1.0)
Monocytes Relative: 12 %
Neutro Abs: 3.3 10*3/uL (ref 1.7–7.7)
Neutrophils Relative %: 77 %
Platelets: 160 10*3/uL (ref 150–400)
RBC: 5.04 MIL/uL (ref 4.22–5.81)
RDW: 13.8 % (ref 11.5–15.5)
WBC: 4.3 10*3/uL (ref 4.0–10.5)
nRBC: 0 % (ref 0.0–0.2)

## 2019-07-12 LAB — D-DIMER, QUANTITATIVE: D-Dimer, Quant: 0.89 ug/mL-FEU — ABNORMAL HIGH (ref 0.00–0.50)

## 2019-07-12 LAB — COMPREHENSIVE METABOLIC PANEL
ALT: 53 U/L — ABNORMAL HIGH (ref 0–44)
AST: 69 U/L — ABNORMAL HIGH (ref 15–41)
Albumin: 3.1 g/dL — ABNORMAL LOW (ref 3.5–5.0)
Alkaline Phosphatase: 70 U/L (ref 38–126)
Anion gap: 12 (ref 5–15)
BUN: 23 mg/dL (ref 8–23)
CO2: 18 mmol/L — ABNORMAL LOW (ref 22–32)
Calcium: 8.9 mg/dL (ref 8.9–10.3)
Chloride: 108 mmol/L (ref 98–111)
Creatinine, Ser: 0.85 mg/dL (ref 0.61–1.24)
GFR calc Af Amer: >60 mL/min (ref >60)
GFR calc non Af Amer: >60 mL/min (ref >60)
Glucose, Bld: 164 mg/dL — ABNORMAL HIGH (ref 70–99)
Potassium: 3.4 mmol/L — ABNORMAL LOW (ref 3.5–5.1)
Sodium: 138 mmol/L (ref 135–145)
Total Bilirubin: 1 mg/dL (ref 0.3–1.2)
Total Protein: 6.1 g/dL — ABNORMAL LOW (ref 6.5–8.1)

## 2019-07-12 LAB — C-REACTIVE PROTEIN: CRP: 2.1 mg/dL — ABNORMAL HIGH (ref <1.0)

## 2019-07-12 MED ORDER — LORATADINE 10 MG PO TABS
10.0000 mg | ORAL_TABLET | Freq: Every day | ORAL | Status: DC | PRN
  Administered 2019-07-12: 11:00:00 10 mg via ORAL
  Filled 2019-07-12: qty 1

## 2019-07-12 MED ORDER — SALINE SPRAY 0.65 % NA SOLN
1.0000 | NASAL | Status: DC | PRN
  Filled 2019-07-12: qty 44

## 2019-07-12 MED ORDER — POTASSIUM CHLORIDE CRYS ER 20 MEQ PO TBCR
20.0000 meq | EXTENDED_RELEASE_TABLET | Freq: Once | ORAL | Status: AC
  Administered 2019-07-12: 10:00:00 20 meq via ORAL
  Filled 2019-07-12: qty 1

## 2019-07-12 NOTE — Progress Notes (Signed)
Patient states that he has updated spouse via phone, patient does not wish for [place] to update family via phone at this time.

## 2019-07-12 NOTE — Progress Notes (Addendum)
PROGRESS NOTE  Jose [name]  [id number] DOB: April 24, 1941 DOA: 07/09/2019 PCP: Marin Olp, MD   Brief Narrative: Jose [name] is a 78 y.o. male with a history of Barrett's esophagus and diverticular hemorrhage who returned to the ED 11/9 for increasing dyspnea, nausea and vomiting. He had been evaluated for flank pain in the ED 2 days prior and was sent home with doxycycline for possible atypical pneumonia with scattered GGOs noted at the lung bases on CT stone study which ruled out radiopaque stone. Since that time, he'd grown weaker, having vomiting and shortness of breath. Test that was pending in the ED returned positive for covid-19 and he returned to the ED where he had bilateral infiltrates, hypoxia, and was admitted to be given remdesivir, steroids, and supplemental oxygen.   Assessment & Plan: Principal Problem:   Acute respiratory failure due to COVID-19 [facility]) Active Problems:   Barrett's esophagus   HLD (hyperlipidemia)   GERD (gastroesophageal reflux disease)   Nasal congestion   Aortic aneurysm (HCC)   Fatty liver   Acute respiratory failure with hypoxia (HCC)   Obesity (BMI 30-39.9)   Thrombocytopenia (HCC)   LFT elevation  Acute hypoxic respiratory failure due to covid-19 pneumonia:  - Continue remdesivir 11/9 - 11/13 (PM dosing) - Continue steroids. CRP continuing modest improvements. PCT negative.  - Continue airborne, contact precautions. PPE including surgical gown, gloves, cap, shoe covers, and CAPR used during this encounter in a negative pressure room.  - Trend inflammatory markers, monitor LFTs.  - Avoid NSAIDs - Recommend proning and aggressive use of incentive spirometry.  LFT elevation: Mild, due to covid vs. remdesivir. Also has reported history of fatty liver - Continue daily monitoring while on remdesivir.   History of Barrett's esophagus - Continue PPI - Outpatient follow up for q3y EGD  History of diverticular hemorrhage: No  evidence of GIB, no anemia. - Monitor CBC intermittently.   Hyperlipidemia:  - Continue statin. ALT wnl.  Allergic rhinitis:  - Nasal saline, antihistamine prn.  Hypokalemia: Mild - Replace and monitor  Obesity: BMI 35. Noted  Nausea and vomiting: Resolved. ?if due to doxycycline or covid. Will avoid doxycycline regardless.  - Continue symptomatic management.  AAA:  - Next imaging reportedly due 2023.   Thrombocytopenia: Acute, mild, likely due to covid. Resolved as of 11/12. - Continue DVT ppx. Recheck at follow up and consider appropriate work up if this returns.  Diastolic dysfunction: No clinical evidence of CHF at this time.  - Control BP. No indication for diuresis.   DVT prophylaxis: Continue lovenox 0.5mg /kg q24h Code Status: Full Family Communication: None at bedside.  Disposition Plan: Home pending clinical course after completion of remdesivir. PT/OT evaluations today, check ambulatory pulse oximetry.  Consultants:   None  Procedures: Echocardiogram 07/11/2019 1. Left ventricular ejection fraction, by visual estimation, is 60 to 65%. The left ventricle has normal function. Left ventricular septal wall thickness was mildly increased. Mildly increased left ventricular posterior wall thickness. There is mildly  increased left ventricular hypertrophy.  2. Left ventricular diastolic parameters are consistent with Grade I diastolic dysfunction (impaired relaxation).  3. Global right ventricle has normal systolic function.The right ventricular size is normal. No increase in right ventricular wall thickness.  4. Left atrial size was mildly dilated.  5. Right atrial size was normal.  6. The mitral valve is normal in structure. Trace mitral valve regurgitation. No evidence of mitral stenosis.  7. The tricuspid valve is normal in structure. Tricuspid valve  regurgitation is mild.  8. The aortic valve is tricuspid. Aortic valve regurgitation is trivial. No evidence of aortic  valve sclerosis or stenosis.  9. The pulmonic valve was normal in structure. Pulmonic valve regurgitation is not visualized. 10. Normal pulmonary artery systolic pressure. 11. The inferior vena cava is normal in size with greater than 50% respiratory variability, suggesting right atrial pressure of 3 mmHg.  Antimicrobials:  Remdesivir 11/9 - 11/13   Subjective: Feels breathing is improved, has some chest pain on right side with deep breaths/coughing, not exertional or worsened. Coughing up sputum more, no hemoptysis. Has nasal irritation and itchy eyes consistent with hay fever he has chronically, requesting antihistamine.   Objective: Vitals:   07/11/19 1940 07/12/19 0300 07/12/19 0452 07/12/19 1000  BP: 129/80  104/66 124/82  Pulse: 61  71 76  Resp: 18  20 14   Temp: 97.7 F (36.5 C)  97.8 F (36.6 C) (!) 97.3 F (36.3 C)  TempSrc: Oral  Oral Oral  SpO2: 91% 90% 91% 100%  Weight:      Height:        Intake/Output Summary (Last 24 hours) at 07/12/2019 1139 Last data filed at 07/12/2019 0500 Gross per 24 hour  Intake -  Output 352 ml  Net -352 ml   Filed Weights   07/09/19 2015  Weight: 98.4 kg   Gen: 78 y.o. male in no distress Pulm: Nonlabored breathing supplemental oxygen. No crackles or wheezes. CV: Regular rate and rhythm. No murmur, rub, or gallop. No JVD, no dependent edema. GI: Abdomen soft, non-tender, non-distended, with normoactive bowel sounds.  Ext: Warm, no deformities Skin: No rashes, lesions or ulcers on visualized skin. Neuro: Alert and oriented. No focal neurological deficits. Psych: Judgement and insight appear fair. Mood euthymic & affect congruent. Behavior is appropriate.    Data Reviewed: I have personally reviewed following labs and imaging studies  CBC: Recent Labs  Lab 07/07/19 1020 07/09/19 1803 07/10/19 0448 07/11/19 0120 07/12/19 0200  WBC 1.8* 2.7* 1.5* 2.5* 4.3  NEUTROABS 0.8* 1.8 1.0* 1.6* 3.3  HGB 15.1 15.3 15.1 14.3 14.6   HCT 45.1 45.8 44.3 43.0 43.4  MCV 87.4 86.6 85.5 85.8 86.1  PLT 108* 93* 94* 104* 0000000   Basic Metabolic Panel: Recent Labs  Lab 07/07/19 1020 07/09/19 1803 07/10/19 0448 07/11/19 0120 07/12/19 0200  NA 133* 133* 136 137 138  K 3.5 3.7 3.6 3.7 3.4*  CL 103 101 104 107 108  CO2 21* 18* 19* 22 18*  GLUCOSE 93 116* 136* 119* 164*  BUN 8 11 9 19 23   CREATININE 0.95 1.07 1.04 0.77 0.85  CALCIUM 8.4* 8.8* 8.7* 8.7* 8.9   GFR: Estimated Creatinine Clearance: 82.4 mL/min (by C-G formula based on SCr of 0.85 mg/dL). Liver Function Tests: Recent Labs  Lab 07/07/19 1020 07/09/19 1803 07/10/19 0448 07/11/19 0120 07/12/19 0200  AST 30 46* 37 46* 69*  ALT 21 25 23 30  53*  ALKPHOS 85 79 76 70 70  BILITOT 0.6 1.3* 0.7 0.9 1.0  PROT 6.6 6.6 6.5 6.3* 6.1*  ALBUMIN 3.4* 3.4* 3.2* 3.2* 3.1*   Recent Labs  Lab 07/07/19 1020  LIPASE 32   No results for input(s): AMMONIA in the last 168 hours. Coagulation Profile: No results for input(s): INR, PROTIME in the last 168 hours. Cardiac Enzymes: No results for input(s): CKTOTAL, CKMB, CKMBINDEX, TROPONINI in the last 168 hours. BNP (last 3 results) No results for input(s): PROBNP in the last 8760 hours. HbA1C:  No results for input(s): HGBA1C in the last 72 hours. CBG: No results for input(s): GLUCAP in the last 168 hours. Lipid Profile: Recent Labs    07/09/19 1803  TRIG 52   Thyroid Function Tests: No results for input(s): TSH, T4TOTAL, FREET4, T3FREE, THYROIDAB in the last 72 hours. Anemia Panel: Recent Labs    07/09/19 2251  FERRITIN 272   Urine analysis:    Component Value Date/Time   COLORURINE YELLOW 07/07/2019 1021   APPEARANCEUR HAZY (A) 07/07/2019 1021   LABSPEC 1.026 07/07/2019 1021   PHURINE 5.0 07/07/2019 1021   GLUCOSEU NEGATIVE 07/07/2019 1021   GLUCOSEU NEGATIVE 04/18/2009 0808   HGBUR NEGATIVE 07/07/2019 1021   HGBUR 2+ 02/27/2008 0819   BILIRUBINUR NEGATIVE 07/07/2019 1021   BILIRUBINUR small (A)  07/06/2019 1959   KETONESUR NEGATIVE 07/07/2019 1021   PROTEINUR 30 (A) 07/07/2019 1021   UROBILINOGEN 1.0 07/06/2019 1959   UROBILINOGEN 1.0 04/14/2015 1359   NITRITE NEGATIVE 07/07/2019 1021   LEUKOCYTESUR NEGATIVE 07/07/2019 1021   Recent Results (from the past 240 hour(s))  SARS CORONAVIRUS 2 (TAT 6-24 HRS) Nasopharyngeal Nasopharyngeal Swab     Status: Abnormal   Collection Time: 07/07/19  4:19 PM   Specimen: Nasopharyngeal Swab  Result Value Ref Range Status   SARS Coronavirus 2 POSITIVE (A) NEGATIVE Final    Comment: RESULT CALLED TO, READ BACK BY AND VERIFIED WITH: EMAILED TO Aretta Nip [id number] 07/08/2019 T. TYSOR (NOTE) SARS-CoV-2 target nucleic acids are DETECTED. The SARS-CoV-2 RNA is generally detectable in upper and lower respiratory specimens during the acute phase of infection. Positive results are indicative of active infection with SARS-CoV-2. Clinical  correlation with patient history and other diagnostic information is necessary to determine patient infection status. Positive results do  not rule out bacterial infection or co-infection with other viruses. The expected result is Negative. Fact Sheet for Patients: SugarRoll.be Fact Sheet for Healthcare Providers: https://www.woods-mathews.com/ This test is not yet approved or cleared by the [place] FDA and  has been authorized for detection and/or diagnosis of SARS-CoV-2 by FDA under an Emergency Use Authorization (EUA). This EUA will remain  in effect (meaning this test can be  used) for the duration of the COVID-19 declaration under Section 564(b)(1) of the Act, 21 U.S.C. section 360bbb-3(b)(1), unless the authorization is terminated or revoked sooner. Performed at [facility], [place] [address], [place], [place] [address]   Blood Culture (routine x 2)     Status: None (Preliminary result)   Collection Time: 07/09/19  6:03 PM   Specimen: BLOOD  Result Value  Ref Range Status   Specimen Description BLOOD RIGHT ANTECUBITAL  Final   Special Requests   Final    BOTTLES DRAWN AEROBIC AND ANAEROBIC Blood Culture results may not be optimal due to an inadequate volume of blood received in culture bottles   Culture   Final    NO GROWTH 3 DAYS Performed at [facility], [place] [address] [place], [place] [address]    Report Status PENDING  Incomplete  Blood Culture (routine x 2)     Status: None (Preliminary result)   Collection Time: 07/09/19  6:48 PM   Specimen: BLOOD  Result Value Ref Range Status   Specimen Description BLOOD LEFT ANTECUBITAL  Final   Special Requests   Final    BOTTLES DRAWN AEROBIC AND ANAEROBIC Blood Culture results may not be optimal due to an inadequate volume of blood received in culture bottles   Culture  Final    NO GROWTH 3 DAYS Performed at [facility], [place] [address] [place], [place] [address]    Report Status PENDING  Incomplete      Radiology Studies: No results found.  Scheduled Meds: . albuterol  2 puff Inhalation Q6H  . dexamethasone  6 mg Oral Q24H  . enoxaparin (LOVENOX) injection  50 mg Subcutaneous Q24H  . pantoprazole  80 mg Oral Daily  . rosuvastatin  20 mg Oral Daily  . vitamin C  500 mg Oral Daily  . zinc sulfate  220 mg Oral Daily   Continuous Infusions: . remdesivir 100 mg in NS 250 mL 100 mg (07/11/19 1556)     LOS: 3 days   Time spent: 35 minutes.  Patrecia Pour, MD [facility] [email or web address] 07/12/2019, 11:39 AM

## 2019-07-12 NOTE — Plan of Care (Signed)
Spent time at beginning of shift discussing/educating pt on use of IS/Flutter valve and benefits from this.  Pt verbalized his willingness to use both when awake.  Able to decrease Oxygen to [facility].  Pt did some walking in place at beside and sats dropped after 2 minutes of performing stationary walking before sats dropped to 86-87%.    Pt continues to refuse lovenox shots, stating he doesn't think they will help him.  Reeducated pt with same results.    Problem: Respiratory: Goal: Complications related to the disease process, condition or treatment will be avoided or minimized Outcome: Progressing

## 2019-07-12 NOTE — Evaluation (Addendum)
Physical Therapy Evaluation Patient Details Name: Jose [name] MRN: [id number] DOB: Feb 27, 1941 Today's Date: 07/12/2019   History of Present Illness  Jose [name] is a 78 y.o. male with a history of Barrett's esophagus and diverticular hemorrhage seen in ED 11/7 for flank pain, returns 11/9 with increasing dyspnea, Nausea and vomiting.Patient tested positive for COVID where he had bilateral infiltrates, hypoxia.  Clinical Impression  The patient received on 3  l [place] (finger probe) with SPO2 >95%. Patient ambulated x 100' on RA with SPO2 87%, then placed on 3 L for another 100' with SPO2 93%. Placed probe on ear with increased SPO2 reading compared to finger. Patient ambulated in room on RA with SPO2 98% (ear probe reading) Left patient on RA and notified nursing. Patient will benefit from skilled PT to ensure increased mobility, practice a few steps and monitpr SPO2 for needs at DC of supplemental oxygen. Patient did push the monitor.  patient  Encouraged use of flutter valve after mobilizing when SOB and continue IS    Follow Up Recommendations No PT follow up    Equipment Recommendations  None recommended by PT    Recommendations for Other Services       Precautions / Restrictions Monitor sats   Mobility  Bed Mobility Overal bed mobility: Independent                Transfers Overall transfer level: Independent Equipment used: None                Ambulation/Gait Ambulation/Gait assistance: Supervision Gait Distance (Feet): 200 Feet Assistive device: patient pushed monitor Gait Pattern/deviations: WFL(Within Functional Limits)     General Gait Details: decreased  speed, no balance loss,  Stairs            Wheelchair Mobility    Modified Rankin (Stroke Patients Only)       Balance Overall balance assessment: No apparent balance deficits (not formally assessed)                                           Pertinent  Vitals/Pain Pain Assessment: No/denies pain    Home Living Family/patient expects to be discharged to:: Private residence   Available Help at Discharge: Family;Available 24 hours/day Type of Home: House       Home Layout: Two level Home Equipment: Walker - 2 wheels;Bedside commode Additional Comments: Has a man cave in basement where he satays but has to come up 12 to shower and eat.    Prior Function Level of Independence: Independent               Hand Dominance   Dominant Hand: Right    Extremity/Trunk Assessment   Upper Extremity Assessment Upper Extremity Assessment: Generalized weakness    Lower Extremity Assessment Lower Extremity Assessment: Generalized weakness    Cervical / Trunk Assessment Cervical / Trunk Assessment: Normal  Communication   Communication: No difficulties;HOH  Cognition Arousal/Alertness: Awake/alert Behavior During Therapy: WFL for tasks assessed/performed Overall Cognitive Status: Within Functional Limits for tasks assessed                                        General Comments      Exercises     Assessment/Plan    PT Assessment Patient  needs continued PT services  PT Problem List Decreased strength;Decreased mobility;Cardiopulmonary status limiting activity;Decreased knowledge of use of DME;Decreased activity tolerance       PT Treatment Interventions Gait training;Patient/family education;Therapeutic activities;Therapeutic exercise    PT Goals (Current goals can be found in the Care Plan section)  Acute Rehab PT Goals Patient Stated Goal: go home on no O2 PT Goal Formulation: With patient Time For Goal Achievement: 07/26/19 Potential to Achieve Goals: Good    Frequency Min 3X/week   Barriers to discharge        Co-evaluation               AM-PAC PT "6 Clicks" Mobility  Outcome Measure Help needed turning from your back to your side while in a flat bed without using bedrails?:  None Help needed moving from lying on your back to sitting on the side of a flat bed without using bedrails?: None Help needed moving to and from a bed to a chair (including a wheelchair)?: None Help needed standing up from a chair using your arms (e.g., wheelchair or bedside chair)?: None Help needed to walk in hospital room?: None Help needed climbing 3-5 steps with a railing? : A Little 6 Click Score: 23    End of Session Equipment Utilized During Treatment: Oxygen Activity Tolerance: Patient tolerated treatment well Patient left: in chair;with call bell/phone within reach Nurse Communication: Mobility status PT Visit Diagnosis: Unsteadiness on feet (R26.81);Difficulty in walking, not elsewhere classified (R26.2)    Time: [id number] PT Time Calculation (min) (ACUTE ONLY): 69 min   Charges:   PT Evaluation $PT Eval Moderate Complexity: 1 Mod PT Treatments $Gait Training: 23-37 mins $Self Care/Home Management: [place]  Office [phone]   Claretha Cooper 07/12/2019, 1:38 PM

## 2019-07-13 LAB — COMPREHENSIVE METABOLIC PANEL
ALT: 75 U/L — ABNORMAL HIGH (ref 0–44)
AST: 76 U/L — ABNORMAL HIGH (ref 15–41)
Albumin: 3.1 g/dL — ABNORMAL LOW (ref 3.5–5.0)
Alkaline Phosphatase: 71 U/L (ref 38–126)
Anion gap: 11 (ref 5–15)
BUN: 21 mg/dL (ref 8–23)
CO2: 21 mmol/L — ABNORMAL LOW (ref 22–32)
Calcium: 8.6 mg/dL — ABNORMAL LOW (ref 8.9–10.3)
Chloride: 108 mmol/L (ref 98–111)
Creatinine, Ser: 1 mg/dL (ref 0.61–1.24)
GFR calc Af Amer: >60 mL/min (ref >60)
GFR calc non Af Amer: >60 mL/min (ref >60)
Glucose, Bld: 142 mg/dL — ABNORMAL HIGH (ref 70–99)
Potassium: 3.4 mmol/L — ABNORMAL LOW (ref 3.5–5.1)
Sodium: 140 mmol/L (ref 135–145)
Total Bilirubin: 0.8 mg/dL (ref 0.3–1.2)
Total Protein: 6 g/dL — ABNORMAL LOW (ref 6.5–8.1)

## 2019-07-13 LAB — CBC WITH DIFFERENTIAL/PLATELET
Abs Immature Granulocytes: 0.04 10*3/uL (ref 0.00–0.07)
Basophils Absolute: 0 10*3/uL (ref 0.0–0.1)
Basophils Relative: 0 %
Eosinophils Absolute: 0 10*3/uL (ref 0.0–0.5)
Eosinophils Relative: 0 %
HCT: 43.4 % (ref 39.0–52.0)
Hemoglobin: 14.5 g/dL (ref 13.0–17.0)
Immature Granulocytes: 1 %
Lymphocytes Relative: 9 %
Lymphs Abs: 0.5 10*3/uL — ABNORMAL LOW (ref 0.7–4.0)
MCH: 28.9 pg (ref 26.0–34.0)
MCHC: 33.4 g/dL (ref 30.0–36.0)
MCV: 86.5 fL (ref 80.0–100.0)
Monocytes Absolute: 0.6 10*3/uL (ref 0.1–1.0)
Monocytes Relative: 11 %
Neutro Abs: 4.3 10*3/uL (ref 1.7–7.7)
Neutrophils Relative %: 79 %
Platelets: 165 10*3/uL (ref 150–400)
RBC: 5.02 MIL/uL (ref 4.22–5.81)
RDW: 13.8 % (ref 11.5–15.5)
WBC: 5.5 10*3/uL (ref 4.0–10.5)
nRBC: 0 % (ref 0.0–0.2)

## 2019-07-13 LAB — D-DIMER, QUANTITATIVE: D-Dimer, Quant: 0.93 ug/mL-FEU — ABNORMAL HIGH (ref 0.00–0.50)

## 2019-07-13 LAB — C-REACTIVE PROTEIN: CRP: 0.9 mg/dL (ref <1.0)

## 2019-07-13 MED ORDER — DEXAMETHASONE 6 MG PO TABS: 6.0000 mg | ORAL_TABLET | ORAL | 0 refills | Status: DC

## 2019-07-13 NOTE — Evaluation (Signed)
Occupational Therapy Evaluation Patient Details Name: Jose [name] MRN: [id number] DOB: 09-Mar-1941 Today's Date: 07/13/2019    History of Present Illness Jose [name] is a 78 y.o. male with a history of Barrett's esophagus and diverticular hemorrhage seen in ED 11/7 for flank pain, returns 11/9 with increasing dyspnea, Nausea and vomiting.Patient tested positive for COVID where he had bilateral infiltrates, hypoxia.   Clinical Impression   This 78 y/o male presents with the above. PTA pt reports independence with ADL, iADL and functional mobility. Pt currently performing room and hallway level mobility without AD at [facility] - supervision level; performing ADL with supervision-mod independence. Pt on RA throughout session with SpO2 >92%. Further reviewed energy conservation and activity progression with functional tasks after return home. Pt asking appropriate questions throughout. He will benefit from continued OT services while in acute setting to progress pt towards his PLOF.     Follow Up Recommendations  No OT follow up;Supervision/Assistance - 24 hour    Equipment Recommendations  None recommended by OT    Recommendations for Other Services       Precautions / Restrictions Precautions Precaution Comments: monitor sats Restrictions Weight Bearing Restrictions: No      Mobility Bed Mobility               General bed mobility comments: OOB in recliner upon arrival  Transfers Overall transfer level: Independent Equipment used: None                  Balance Overall balance assessment: Mild deficits observed, not formally tested                                         ADL either performed or assessed with clinical judgement   ADL Overall ADL's : Needs assistance/impaired Eating/Feeding: Independent;Sitting   Grooming: Modified independent;Supervision/safety;Wash/dry hands;Standing Grooming Details (indicate cue type and  reason): sink level in bathroom Upper Body Bathing: Modified independent;Sitting   Lower Body Bathing: Supervison/ safety;Sit to/from stand   Upper Body Dressing : Modified independent;Sitting   Lower Body Dressing: Supervision/safety;Sit to/from stand   Toilet Transfer: Supervision/safety;Ambulation   Toileting- Clothing Manipulation and Hygiene: Modified independent;Supervision/safety;Sit to/from stand Toileting - Clothing Manipulation Details (indicate cue type and reason): pt completing toileting ADL without therapist assist; therapist in room for assist PRN    Tub/Shower Transfer Details (indicate cue type and reason): discussed use of shower seat for bathing ADL for energy conservation; pt reports he uses for increased safety since last TKA approx 1 year ago Functional mobility during ADLs: Min guard General ADL Comments: further educated pt in activity progression and energy conservation techniques in preparation for return home     Vision         Perception     Praxis      Pertinent Vitals/Pain       Hand Dominance Right   Extremity/Trunk Assessment Upper Extremity Assessment Upper Extremity Assessment: Generalized weakness   Lower Extremity Assessment Lower Extremity Assessment: Defer to PT evaluation   Cervical / Trunk Assessment Cervical / Trunk Assessment: Normal   Communication Communication Communication: No difficulties;HOH   Cognition Arousal/Alertness: Awake/alert Behavior During Therapy: WFL for tasks assessed/performed Overall Cognitive Status: Within Functional Limits for tasks assessed  General Comments       Exercises     Shoulder Instructions      Home Living Family/patient expects to be discharged to:: Private residence Living Arrangements: Spouse/significant other Available Help at Discharge: Family;Available 24 hours/day Type of Home: House       Home Layout: Two  level Alternate Level Stairs-Number of Steps: 12 Alternate Level Stairs-Rails: Right Bathroom Shower/Tub: Occupational psychologist: Standard     Home Equipment: Environmental consultant - 2 wheels;Bedside commode;Shower seat - built in   Additional Comments: Has a man cave in basement where he satays but has to come up 12 to shower and eat.      Prior Functioning/Environment Level of Independence: Independent        Comments: drives        OT Problem List: Decreased strength;Decreased activity tolerance;Cardiopulmonary status limiting activity;Impaired balance (sitting and/or standing)      OT Treatment/Interventions: Self-care/ADL training;Therapeutic exercise;Energy conservation;DME and/or AE instruction;Therapeutic activities;Patient/family education;Balance training    OT Goals(Current goals can be found in the care plan section) Acute Rehab OT Goals Patient Stated Goal: go home on no O2 OT Goal Formulation: With patient Time For Goal Achievement: 07/27/19 Potential to Achieve Goals: Good  OT Frequency: Min 3X/week   Barriers to D/C:            Co-evaluation              AM-PAC OT "6 Clicks" Daily Activity     Outcome Measure Help from another person eating meals?: None Help from another person taking care of personal grooming?: None Help from another person toileting, which includes using toliet, bedpan, or urinal?: None Help from another person bathing (including washing, rinsing, drying)?: A Little Help from another person to put on and taking off regular upper body clothing?: None Help from another person to put on and taking off regular lower body clothing?: A Little 6 Click Score: 22   End of Session Nurse Communication: Mobility status  Activity Tolerance: Patient tolerated treatment well Patient left: in chair;with call bell/phone within reach  OT Visit Diagnosis: Muscle weakness (generalized) (M62.81);Other (comment)(decreased activity tolerance)                 Time: 0930-1004 OT Time Calculation (min): 34 min Charges:  OT General Charges $OT Visit: 1 Visit OT Evaluation $OT Eval Moderate Complexity: 1 Mod OT Treatments $Self Care/Home Management : 8-22 mins  Lou Cal, OT Supplemental Rehabilitation Services Pager [phone] Office [place] 07/13/2019, 10:45 AM

## 2019-07-13 NOTE — Progress Notes (Signed)
Physical Therapy Treatment Patient Details Name: Jose [name] MRN: [id number] DOB: 25-Jul-1941 Today's Date: 07/13/2019    History of Present Illness Jose [name] is a 78 y.o. male with a history of Barrett's esophagus and diverticular hemorrhage seen in ED 11/7 for flank pain, returns 11/9 with increasing dyspnea, Nausea and vomiting. Patient tested positive for COVID where he had bilateral infiltrates, hypoxia.   PT Comments    Pt progressing well with mobility; indep with ambulation and ADL tasks in room. Preparing for d/c home today. Reviewed educ re: flutter valve/IS use, activity recommendations, fall risk reduction, energy conservation strategies (handout provided). Pt with good insight into decreased activity tolerance. If to remain admitted, will continue to follow acutely.    Follow Up Recommendations  No PT follow up     Equipment Recommendations  None recommended by PT    Recommendations for Other Services       Precautions / Restrictions Precautions Precautions: Fall Restrictions Weight Bearing Restrictions: No    Mobility  Bed Mobility               General bed mobility comments: OOB in recliner upon arrival  Transfers Overall transfer level: Independent Equipment used: None                Ambulation/Gait Ambulation/Gait assistance: Independent Gait Distance (Feet): 40 Feet Assistive device: None Gait Pattern/deviations: WFL(Within Functional Limits)     General Gait Details: Indep ambulation in room   Stairs Stairs: (Pt declined; discussed safety and energy conservation with stairs)           Wheelchair Mobility    Modified Rankin (Stroke Patients Only)       Balance                                            Cognition Arousal/Alertness: Awake/alert Behavior During Therapy: WFL for tasks assessed/performed Overall Cognitive Status: Within Functional Limits for tasks assessed                                         Exercises      General Comments General comments (skin integrity, edema, etc.): Pt preparing for d/c home. Already packed IS/flutter valve, reviewed importance of these. Reviewed educ re: activity recommendations, fall risk reduction, energy conservation strategies (handout provided)      Pertinent Vitals/Pain Pain Assessment: No/denies pain    Home Living                      Prior Function            PT Goals (current goals can now be found in the care plan section) Progress towards PT goals: Progressing toward goals    Frequency    Min 3X/week      PT Plan Current plan remains appropriate    Co-evaluation              AM-PAC PT "6 Clicks" Mobility   Outcome Measure  Help needed turning from your back to your side while in a flat bed without using bedrails?: None Help needed moving from lying on your back to sitting on the side of a flat bed without using bedrails?: None Help needed moving to and from a bed to a  chair (including a wheelchair)?: None Help needed standing up from a chair using your arms (e.g., wheelchair or bedside chair)?: None Help needed to walk in hospital room?: None Help needed climbing 3-5 steps with a railing? : A Little 6 Click Score: 23    End of Session   Activity Tolerance: Patient tolerated treatment well Patient left: in chair;with call bell/phone within reach Nurse Communication: Mobility status PT Visit Diagnosis: Unsteadiness on feet (R26.81);Difficulty in walking, not elsewhere classified (R26.2)     Time: [id number] PT Time Calculation (min) (ACUTE ONLY): 14 min  Charges:  $Self Care/Home Management: [place], PT, DPT Acute Rehabilitation Services  Pager [phone] Office [place] 07/13/2019, 4:11 PM

## 2019-07-13 NOTE — Discharge Summary (Signed)
Physician Discharge Summary  Jose [name] [id number] DOB: 06/09/1941 DOA: 07/09/2019  PCP: Marin Olp, MD  Admit date: 07/09/2019 Discharge date: 07/13/2019  Admitted From: Home Disposition: Home   Recommendations for Outpatient Follow-up:  1. Follow up with PCP in 1-2 weeks 2. Please obtain CMP/CBC in one week  Home Health: None recommended Equipment/Devices: None needed Discharge Condition: Stable CODE STATUS: Full Diet recommendation: Heart healthy  Brief/Interim Summary: Jose [name] is a 78 y.o. male with a history of Barrett's esophagus and diverticular hemorrhage who returned to the ED 11/9 for increasing dyspnea, nausea and vomiting. He had been evaluated for flank pain in the ED 2 days prior and was sent home with doxycycline for possible atypical pneumonia with scattered GGOs noted at the lung bases on CT stone study which ruled out radiopaque stone. Since that time, he'd grown weaker, having vomiting and shortness of breath. Test that was pending in the ED returned positive for covid-19 and he returned to the ED where he had bilateral infiltrates, hypoxia, and was admitted to be given remdesivir, steroids, and supplemental oxygen. Fortunately after 4 doses of remdesivir he has been liberated from supplemental oxygen, and is without hypoxia on exertion. He will complete the 5th dose and be discharged in stable condition.  Discharge Diagnoses:  Principal Problem:   Acute respiratory failure due to COVID-19 [facility]) Active Problems:   Barrett's esophagus   HLD (hyperlipidemia)   GERD (gastroesophageal reflux disease)   Nasal congestion   Aortic aneurysm (HCC)   Fatty liver   Acute respiratory failure with hypoxia (HCC)   Obesity (BMI 30-39.9)   Thrombocytopenia (HCC)   LFT elevation  Acute hypoxic respiratory failure due to covid-19 pneumonia:  - Complete remdesivir 11/9 - 11/13   - Continue steroids. CRP has normalized. PCT negative.  - Continue  airborne, contact precautions. PPE including surgical gown, gloves, cap, shoe covers, and CAPR used during this encounter in a negative pressure room.  - Avoid NSAIDs - Recommend continued use of incentive spirometry.  LFT elevation: Mild, due to covid vs. remdesivir. Also has reported history of fatty liver - Continue monitoring at follow up.  History of Barrett's esophagus - Continue PPI - Outpatient follow up for q3y EGD  History of diverticular hemorrhage: No evidence of GIB, no anemia. - Monitor CBC intermittently.   Hyperlipidemia:  - Continue statin. ALT elevation is mild.  Allergic rhinitis:  - Nasal saline, antihistamine prn.  Hypokalemia: Mild - Replace and monitor  Obesity: BMI 35. Noted  Nausea and vomiting: Resolved. ?if due to doxycycline or covid. Will avoid doxycycline regardless.   AAA:  - Next imaging reportedly due 2023.   Thrombocytopenia: Acute, mild, likely due to covid. Resolved as of 11/12.  Diastolic dysfunction: No clinical evidence of CHF at this time.  - Control BP. No indication for diuresis.   Discharge Instructions Discharge Instructions    Diet - low sodium heart healthy   Complete by: As directed    Discharge instructions   Complete by: As directed    You are being discharged from the hospital after treatment for covid-19 infection. You are felt to be stable enough to no longer require inpatient monitoring, testing, and treatment, though you will need to follow the recommendations below: - Continue taking decadron (steroid) daily for 5 more days - Remain in self-isolation for 21 days from your positive test to reduce risk of transmission of the virus.  - You can take vitamin C and zinc over  the counter. - Please follow up with your primary doctor - Do not take NSAID medications (including, but not limited to, ibuprofen, advil, motrin, naproxen, aleve, goody's powder, etc.) - Follow up with your doctor in the next week via  telehealth or seek medical attention right away if your symptoms get WORSE.  - Consider donating plasma after you have recovered (either 14 days after a negative test or 28 days after symptoms have completely resolved) because your antibodies to this virus may be helpful to give to others with life-threatening infections. Please go to the website www.[email or web address] if you would like to consider volunteering for plasma donation.    Directions for you at home:  Wear a facemask You should wear a facemask that covers your nose and mouth when you are in the same room with other people and when you visit a healthcare provider. People who live with or visit you should also wear a facemask while they are in the same room with you.  Separate yourself from other people in your home As much as possible, you should stay in a different room from other people in your home. Also, you should use a separate bathroom, if available.  Avoid sharing household items You should not share dishes, drinking glasses, cups, eating utensils, towels, bedding, or other items with other people in your home. After using these items, you should wash them thoroughly with soap and water.  Cover your coughs and sneezes Cover your mouth and nose with a tissue when you cough or sneeze, or you can cough or sneeze into your sleeve. Throw used tissues in a lined trash can, and immediately wash your hands with soap and water for at least 20 seconds or use an alcohol-based hand rub.  Wash your [facility] your hands often and thoroughly with soap and water for at least 20 seconds. You can use an alcohol-based hand sanitizer if soap and water are not available and if your hands are not visibly dirty. Avoid touching your eyes, nose, and mouth with unwashed hands.  Directions for those who live with, or provide care at home for you:  Limit the number of people who have contact with the patient If possible, have only one caregiver for  the patient. Other household members should stay in another home or place of residence. If this is not possible, they should stay in another room, or be separated from the patient as much as possible. Use a separate bathroom, if available. Restrict visitors who do not have an essential need to be in the home.  Ensure good ventilation Make sure that shared spaces in the home have good air flow, such as from an air conditioner or an opened window, weather permitting.  Wash your hands often Wash your hands often and thoroughly with soap and water for at least 20 seconds. You can use an alcohol based hand sanitizer if soap and water are not available and if your hands are not visibly dirty. Avoid touching your eyes, nose, and mouth with unwashed hands. Use disposable paper towels to dry your hands. If not available, use dedicated cloth towels and replace them when they become wet.  Wear a facemask and gloves Wear a disposable facemask at all times in the room and gloves when you touch or have contact with the patient's blood, body fluids, and/or secretions or excretions, such as sweat, saliva, sputum, nasal mucus, vomit, urine, or feces.  Ensure the mask fits over your nose and mouth tightly, and  do not touch it during use. Throw out disposable facemasks and gloves after using them. Do not reuse. Wash your hands immediately after removing your facemask and gloves. If your personal clothing becomes contaminated, carefully remove clothing and launder. Wash your hands after handling contaminated clothing. Place all used disposable facemasks, gloves, and other waste in a lined container before disposing them with other household waste. Remove gloves and wash your hands immediately after handling these items.  Do not share dishes, glasses, or other household items with the patient Avoid sharing household items. You should not share dishes, drinking glasses, cups, eating utensils, towels, bedding, or  other items with a patient who is confirmed to have, or being evaluated for, COVID-19 infection. After the person uses these items, you should wash them thoroughly with soap and water.  Wash laundry thoroughly Immediately remove and wash clothes or bedding that have blood, body fluids, and/or secretions or excretions, such as sweat, saliva, sputum, nasal mucus, vomit, urine, or feces, on them. Wear gloves when handling laundry from the patient. Read and follow directions on labels of laundry or clothing items and detergent. In general, wash and dry with the warmest temperatures recommended on the label.  Clean all areas the individual has used often Clean all touchable surfaces, such as counters, tabletops, doorknobs, bathroom fixtures, toilets, phones, keyboards, tablets, and bedside tables, every day. Also, clean any surfaces that may have blood, body fluids, and/or secretions or excretions on them. Wear gloves when cleaning surfaces the patient has come in contact with. Use a diluted bleach solution (e.g., dilute bleach with 1 part bleach and 10 parts water) or a household disinfectant with a label that says EPA-registered for coronaviruses. To make a bleach solution at home, add 1 tablespoon of bleach to 1 quart (4 cups) of water. For a larger supply, add  cup of bleach to 1 gallon (16 cups) of water. Read labels of cleaning products and follow recommendations provided on product labels. Labels contain instructions for safe and effective use of the cleaning product including precautions you should take when applying the product, such as wearing gloves or eye protection and making sure you have good ventilation during use of the product. Remove gloves and wash hands immediately after cleaning.  Monitor yourself for signs and symptoms of illness Caregivers and household members are considered close contacts, should monitor their health, and will be asked to limit movement outside of the home to  the extent possible. Follow the monitoring steps for close contacts listed on the symptom monitoring form.  If you have additional questions, contact your local health department or call the epidemiologist on call at [phone] (available 24/7). This guidance is subject to change. For the most up-to-date guidance from [facility], please refer to their website: [email or web address]   Increase activity slowly   Complete by: As directed    MyChart COVID-19 home monitoring program   Complete by: Jul 13, 2019    Is the patient willing to use the [place] for home monitoring?: Yes     Allergies as of 07/13/2019      Reactions   Lipitor [atorvastatin]    REACTION: muscle weakness      Medication List    TAKE these medications   aspirin EC 81 MG tablet Take 1 tablet (81 mg total) by mouth 2 (two) times daily. For DVT prophylaxis What changed: when to take this   dexamethasone 6 MG tablet Commonly known as: DECADRON Take 1 tablet (6 mg total)  by mouth daily. Start taking on: July 14, 2019   omeprazole 40 MG capsule Commonly known as: PRILOSEC Take 1 capsule (40 mg total) by mouth daily.   rosuvastatin 20 MG tablet Commonly known as: CRESTOR TAKE 1 TABLET DAILY   tadalafil 20 MG tablet Commonly known as: CIALIS TAKE ONE-HALF (1/2) TABLET DAILY AS NEEDED FOR ERECTILE DYSFUNCTION What changed:   how much to take  how to take this  when to take this  reasons to take this  additional instructions   traMADol 50 MG tablet Commonly known as: Ultram Take 1-2 tablets (50-100 mg total) by mouth 2 (two) times daily as needed for severe pain. Post-operatively      Follow-up Information    Marin Olp, MD Follow up.   Specialty: Family Medicine Contact information: [place] [address] [phone]          Allergies  Allergen Reactions  . Lipitor [Atorvastatin]      REACTION: muscle weakness    Consultations:  None  Procedures/Studies: Dg Chest 2 View  Result Date: 07/07/2019 CLINICAL DATA:  Shortness of breath, back pain EXAM: CHEST - 2 VIEW COMPARISON:  04/14/2015 FINDINGS: There is mild bilateral interstitial thickening. There is no focal consolidation. There is no pleural effusion or pneumothorax. There is stable cardiomegaly. There is no acute osseous abnormality. IMPRESSION: Cardiomegaly with mild pulmonary vascular congestion. Electronically Signed   By: Kathreen Devoid   On: 07/07/2019 16:00   Dg Chest Port 1 View  Result Date: 07/09/2019 CLINICAL DATA:  History of COVID-19 positivity with shortness of breath EXAM: PORTABLE CHEST 1 VIEW COMPARISON:  07/07/2019 FINDINGS: Cardiac shadow is mildly prominent but accentuated by the portable technique. Aortic calcifications are noted. Patient is rotated to the right accentuating the mediastinal markings. Patchy increased interstitial markings are seen bilaterally particularly in the right mid lung and left lung base increased from the prior exam consistent with the patient's given clinical history. No sizable effusion is noted. No bony abnormality is seen. IMPRESSION: Patchy interstitial changes consistent with the patient's given clinical history Electronically Signed   By: Inez Catalina M.D.   On: 07/09/2019 19:02   Ct Renal Stone Study  Result Date: 07/07/2019 CLINICAL DATA:  Pt states he has lower right side abdominal pain EXAM: CT ABDOMEN AND PELVIS WITHOUT CONTRAST TECHNIQUE: Multidetector CT imaging of the abdomen and pelvis was performed following the standard protocol without IV contrast. COMPARISON:  CT abdomen pelvis 04/20/2019 FINDINGS: Lower chest: Coronary artery calcification. No pericardial or pleural effusion. There are scattered ill-defined ground-glass opacities in the left lung base. Evaluation of the abdominal viscera is somewhat limited by the lack of IV contrast. Hepatobiliary: No focal  liver abnormality is seen. Small amount of layering gallstones in the gallbladder. No gallbladder wall thickening, or biliary dilatation. Pancreas: Unremarkable Spleen: Normal in size without focal abnormality. Adrenals/Urinary Tract: Adrenal glands are unremarkable. Bilateral renal cysts. No renal calculi or hydronephrosis. Bladder is unremarkable. Stomach/Bowel: Stomach is within normal limits. Appendix appears normal. No evidence of bowel wall thickening, distention, or inflammatory changes. Multiple scattered colonic diverticula without evidence of diverticulitis. Vascular/Lymphatic: Extensive aortoiliac atherosclerotic calcification with a 3.3 cm aneurysm of the infrarenal abdominal aorta. Reproductive: Prostate is unremarkable. Other: Mild periumbilical fat containing hernia. No abdominopelvic ascites. Musculoskeletal: No acute or significant osseous findings. IMPRESSION: 1. No renal calculi or hydronephrosis. 2. No other acute intra-abdominal pathology on a noncontrast scan. 3. 3.3 cm infrarenal abdominal aortic aneurysm. 4. Cholelithiasis without  evidence of acute cholecystitis. 5. Scattered colonic diverticula without evidence of diverticulitis. 6. Coronary artery calcification. 7. Scattered ill-defined ground-glass opacities in the left lung base which are nonspecific and may represent atelectasis, atypical infection or inflammation not excluded. Electronically Signed   By: Audie Pinto M.D.   On: 07/07/2019 10:59    Subjective: Feels well. Walked around the unit without assistance with lowest SpO2 94% on room air, very minimal dyspnea. Wants to go home. No abd pain, eating well. No N/V/D.  Discharge Exam: Vitals:   07/13/19 0310 07/13/19 0735  BP: 127/62 128/87  Pulse: 67 67  Resp:    Temp: 99.5 F (37.5 C) 97.8 F (36.6 C)  SpO2: 98% 98%   General: Pt is alert, awake, not in acute distress Cardiovascular: RRR, S1/S2 +, no rubs, no gallops Respiratory: CTA bilaterally, no wheezing,  no rhonchi Abdominal: Soft, NT, ND, bowel sounds + Extremities: No edema, no cyanosis  Labs: Basic Metabolic Panel: Recent Labs  Lab 07/09/19 1803 07/10/19 0448 07/11/19 0120 07/12/19 0200 07/13/19 0139  NA 133* 136 137 138 140  K 3.7 3.6 3.7 3.4* 3.4*  CL 101 104 107 108 108  CO2 18* 19* 22 18* 21*  GLUCOSE 116* 136* 119* 164* 142*  BUN 11 9 19 23 21   CREATININE 1.07 1.04 0.77 0.85 1.00  CALCIUM 8.8* 8.7* 8.7* 8.9 8.6*   Liver Function Tests: Recent Labs  Lab 07/09/19 1803 07/10/19 0448 07/11/19 0120 07/12/19 0200 07/13/19 0139  AST 46* 37 46* 69* 76*  ALT 25 23 30  53* 75*  ALKPHOS 79 76 70 70 71  BILITOT 1.3* 0.7 0.9 1.0 0.8  PROT 6.6 6.5 6.3* 6.1* 6.0*  ALBUMIN 3.4* 3.2* 3.2* 3.1* 3.1*   Recent Labs  Lab 07/07/19 1020  LIPASE 32   CBC: Recent Labs  Lab 07/09/19 1803 07/10/19 0448 07/11/19 0120 07/12/19 0200 07/13/19 0139  WBC 2.7* 1.5* 2.5* 4.3 5.5  NEUTROABS 1.8 1.0* 1.6* 3.3 4.3  HGB 15.3 15.1 14.3 14.6 14.5  HCT 45.8 44.3 43.0 43.4 43.4  MCV 86.6 85.5 85.8 86.1 86.5  PLT 93* 94* 104* 160 165   D-Dimer Recent Labs    07/12/19 0200 07/13/19 0139  DDIMER 0.89* 0.93*   Urinalysis    Component Value Date/Time   COLORURINE YELLOW 07/07/2019 1021   APPEARANCEUR HAZY (A) 07/07/2019 1021   LABSPEC 1.026 07/07/2019 1021   PHURINE 5.0 07/07/2019 1021   GLUCOSEU NEGATIVE 07/07/2019 1021   GLUCOSEU NEGATIVE 04/18/2009 0808   HGBUR NEGATIVE 07/07/2019 1021   HGBUR 2+ 02/27/2008 0819   BILIRUBINUR NEGATIVE 07/07/2019 1021   BILIRUBINUR small (A) 07/06/2019 1959   KETONESUR NEGATIVE 07/07/2019 1021   PROTEINUR 30 (A) 07/07/2019 1021   UROBILINOGEN 1.0 07/06/2019 1959   UROBILINOGEN 1.0 04/14/2015 1359   NITRITE NEGATIVE 07/07/2019 1021   LEUKOCYTESUR NEGATIVE 07/07/2019 1021    Microbiology Recent Results (from the past 240 hour(s))  SARS CORONAVIRUS 2 (TAT 6-24 HRS) Nasopharyngeal Nasopharyngeal Swab     Status: Abnormal   Collection  Time: 07/07/19  4:19 PM   Specimen: Nasopharyngeal Swab  Result Value Ref Range Status   SARS Coronavirus 2 POSITIVE (A) NEGATIVE Final    Comment: RESULT CALLED TO, READ BACK BY AND VERIFIED WITH: EMAILED TO Aretta Nip 0809 07/08/2019 T. TYSOR (NOTE) SARS-CoV-2 target nucleic acids are DETECTED. The SARS-CoV-2 RNA is generally detectable in upper and lower respiratory specimens during the acute phase of infection. Positive results are indicative of active infection  with SARS-CoV-2. Clinical  correlation with patient history and other diagnostic information is necessary to determine patient infection status. Positive results do  not rule out bacterial infection or co-infection with other viruses. The expected result is Negative. Fact Sheet for Patients: SugarRoll.be Fact Sheet for Healthcare Providers: https://www.woods-mathews.com/ This test is not yet approved or cleared by the [place] FDA and  has been authorized for detection and/or diagnosis of SARS-CoV-2 by FDA under an Emergency Use Authorization (EUA). This EUA will remain  in effect (meaning this test can be  used) for the duration of the COVID-19 declaration under Section 564(b)(1) of the Act, 21 U.S.C. section 360bbb-3(b)(1), unless the authorization is terminated or revoked sooner. Performed at [facility], [place] [address], [place], [place] [address]   Blood Culture (routine x 2)     Status: None (Preliminary result)   Collection Time: 07/09/19  6:03 PM   Specimen: BLOOD  Result Value Ref Range Status   Specimen Description BLOOD RIGHT ANTECUBITAL  Final   Special Requests   Final    BOTTLES DRAWN AEROBIC AND ANAEROBIC Blood Culture results may not be optimal due to an inadequate volume of blood received in culture bottles   Culture   Final    NO GROWTH 3 DAYS Performed at [facility], [place] [address], [place], [place] [address]    Report Status PENDING   Incomplete  Blood Culture (routine x 2)     Status: None (Preliminary result)   Collection Time: 07/09/19  6:48 PM   Specimen: BLOOD  Result Value Ref Range Status   Specimen Description BLOOD LEFT ANTECUBITAL  Final   Special Requests   Final    BOTTLES DRAWN AEROBIC AND ANAEROBIC Blood Culture results may not be optimal due to an inadequate volume of blood received in culture bottles   Culture   Final    NO GROWTH 3 DAYS Performed at [facility], [place] [address] [place], [place] [address]    Report Status PENDING  Incomplete    Time coordinating discharge: Approximately 40 minutes  Patrecia Pour, MD  [facility] 07/13/2019, 10:28 AM

## 2019-07-13 NOTE — Progress Notes (Signed)
SATURATION QUALIFICATIONS: (This note is used to comply with regulatory documentation for home oxygen)  Patient Saturations on Room Air at Rest = 98%  Patient Saturations on Room Air while Ambulating = 94%  Patient Saturations on N/A Liters of oxygen while Ambulating = N/A%  Please briefly explain why patient needs home oxygen: Pt able to sustain O2 sats >90% on RA with hallway level ambulation.   Full progress note to follow.  Lou Cal, OT [facility] Pager [phone] Office [phone]

## 2019-07-13 NOTE — Progress Notes (Signed)
Education provided to pt. Pt taken to daughter car in w/c. No other questions at this time.

## 2019-07-14 LAB — CULTURE, BLOOD (ROUTINE X 2)
Culture: NO GROWTH
Culture: NO GROWTH

## 2019-07-16 ENCOUNTER — Telehealth: Payer: Self-pay

## 2019-07-16 NOTE — Telephone Encounter (Signed)
Transition Care Management Follow-up Telephone Call  Date of discharge and from where: 07/13/19 [facility]   How have you been since you were released from the hospital? Stable; improving   Any questions or concerns? Yes ; patient with questions on when he will be able to leave his home and if he will need to be retested prior to this   Items Reviewed:  Did the pt receive and understand the discharge instructions provided? Yes   Medications obtained and verified? Yes   Any new allergies since your discharge? No   Dietary orders reviewed? Yes  Do you have support at home? Yes   Other (ie: DME, Home Health, etc) n/a   Functional Questionnaire: (I = Independent and D = Dependent) ADL's: I  Bathing/Dressing- I   Meal Prep- I  Eating- I  Maintaining continence- I  Transferring/Ambulation- I  Managing Meds- I   Follow up appointments reviewed:    PCP Hospital f/u appt confirmed? Yes  Scheduled to see Dr. Yong Channel on 07/17/19 @ 3:20.  [facility] f/u appt confirmed? n/a    Are transportation arrangements needed? No   If their condition worsens, is the pt aware to call  their PCP or go to the ED? Yes  Was the patient provided with contact information for the PCP's office or ED? Yes  Was the pt encouraged to call back with questions or concerns? Yes  Patient also with concerns that he did not receive a call back on the 9th during the day before the office closed.  Reassured patient that this is not the norm and his concerns would be addressed.

## 2019-07-16 NOTE — Telephone Encounter (Signed)
Noted thanks-we will discuss quarantine at visit tomorrow-unlikely to retest.

## 2019-07-17 ENCOUNTER — Encounter: Payer: Self-pay | Admitting: Family Medicine

## 2019-07-17 ENCOUNTER — Ambulatory Visit (INDEPENDENT_AMBULATORY_CARE_PROVIDER_SITE_OTHER): Payer: Medicare Other | Admitting: Family Medicine

## 2019-07-17 VITALS — Temp 98.1°F | Wt 210.0 lb

## 2019-07-17 DIAGNOSIS — J9600 Acute respiratory failure, unspecified whether with hypoxia or hypercapnia: Secondary | ICD-10-CM

## 2019-07-17 DIAGNOSIS — K760 Fatty (change of) liver, not elsewhere classified: Secondary | ICD-10-CM

## 2019-07-17 DIAGNOSIS — J1282 Pneumonia due to coronavirus disease 2019: Secondary | ICD-10-CM

## 2019-07-17 DIAGNOSIS — J1289 Other viral pneumonia: Secondary | ICD-10-CM

## 2019-07-17 DIAGNOSIS — I714 Abdominal aortic aneurysm, without rupture, unspecified: Secondary | ICD-10-CM

## 2019-07-17 DIAGNOSIS — E876 Hypokalemia: Secondary | ICD-10-CM

## 2019-07-17 DIAGNOSIS — U071 COVID-19: Secondary | ICD-10-CM | POA: Diagnosis not present

## 2019-07-17 DIAGNOSIS — R7989 Other specified abnormal findings of blood chemistry: Secondary | ICD-10-CM

## 2019-07-17 NOTE — Progress Notes (Signed)
Phone [phone] Virtual visit via Video note   Subjective:  Chief complaint: Chief Complaint  Patient presents with   Follow-up   hospital f/u- TCM for covid 19   This visit type was conducted due to national recommendations for restrictions regarding the COVID-19 Pandemic (e.g. social distancing).  This format is felt to be most appropriate for this patient at this time balancing risks to patient and risks to population by having him in for in person visit.  No physical exam was performed (except for noted visual exam or audio findings with Telehealth visits).    Our team/I connected with Jose [name] at  3:20 PM EST by a video enabled telemedicine application (doxy.me or caregility through epic) and verified that I am speaking with the correct person using two identifiers.  Location patient: [place] Location provider: [facility], office Persons participating in the virtual visit:  patient  Our team/I discussed the limitations of evaluation and management by telemedicine and the availability of in person appointments. In light of current covid-19 pandemic, patient also understands that we are trying to protect them by minimizing in office contact if at all possible.  The patient expressed consent for telemedicine visit and agreed to proceed. Patient understands insurance will be billed.   Jose [name] is a 78 y.o. year old very pleasant male patient who presents for transitional care management and hospital follow up for COVID-19. Patient was hospitalized from 07/09/2019 to 07/13/2019. A TCM phone call was completed on 07/16/2019 (within 2 business days). Medical complexity moderate    See problem oriented charting as well ROS-mild chest congestion.  No fever or chills.  Cough is improving.  Has some fatigue.  Past Medical History-  Patient Active Problem List   Diagnosis Date Noted   Acute respiratory failure due to COVID-19 [facility]) 07/09/2019    Priority: High    Aortic aneurysm ([place]) 06/04/2019    Priority: High   Atherosclerosis of coronary artery 02/09/2007    Priority: High   Barrett's esophagus 02/09/2007    Priority: High   Fatty liver 06/04/2019    Priority: Medium   GERD (gastroesophageal reflux disease) 10/23/2018    Priority: Medium   Primary osteoarthritis of left knee 02/27/2018    Priority: Medium   Elevated PSA 08/03/2016    Priority: Medium   Benign prostatic hypertrophy (BPH) with nocturia 05/27/2016    Priority: Medium   Hyperlipidemia 02/09/2007    Priority: Medium   Aortic atherosclerosis ([place]) 06/04/2019    Priority: Low   Acute prostatitis 02/21/2019    Priority: Low   Nasal congestion 10/23/2018    Priority: Low   Sinus bradycardia 03/19/2015    Priority: Low   Diverticulosis of colon with hemorrhage     Priority: Low   Actinic keratosis 08/07/2014    Priority: Low   Elevated blood pressure 03/29/2014    Priority: Low   Hyperglycemia 02/26/2013    Priority: Low   OBESITY 03/20/2010    Priority: Low   Generalized anxiety disorder 08/22/2008    Priority: Low   ERECTILE DYSFUNCTION 08/22/2008    Priority: Low   Diverticulosis of large intestine 02/09/2007    Priority: Low   Thrombocytopenia ([place]) 07/12/2019   LFT elevation 07/12/2019   Lumbosacral radiculopathy 06/17/2015   Numbness 06/17/2015   Right leg numbness    Acute posthemorrhagic anemia 10/17/2014   Gastrointestinal hemorrhage 10/15/2014    Medications- reviewed and updated A medical reconciliation was performed comparing current medicines to hospital  discharge medications. Current Outpatient Medications  Medication Sig Dispense Refill   aspirin EC 81 MG tablet Take 1 tablet (81 mg total) by mouth 2 (two) times daily. For DVT prophylaxis (Patient taking differently: Take 81 mg by mouth daily. For DVT prophylaxis) 60 tablet 0   dexamethasone (DECADRON) 6 MG tablet Take 1 tablet (6 mg total) by mouth daily. 5 tablet  0   omeprazole (PRILOSEC) 40 MG capsule Take 1 capsule (40 mg total) by mouth daily. 90 capsule 3   rosuvastatin (CRESTOR) 20 MG tablet TAKE 1 TABLET DAILY (Patient taking differently: Take 20 mg by mouth daily. ) 90 tablet 3   tadalafil (ADCIRCA/CIALIS) 20 MG tablet TAKE ONE-HALF (1/2) TABLET DAILY AS NEEDED FOR ERECTILE DYSFUNCTION (Patient taking differently: Take 10 mg by mouth daily as needed for erectile dysfunction. ) 8 tablet 18   traMADol (ULTRAM) 50 MG tablet Take 1-2 tablets (50-100 mg total) by mouth 2 (two) times daily as needed for severe pain. Post-operatively 60 tablet 3   No current facility-administered medications for this visit.      Objective:  Temp 98.1 F (36.7 C)    Wt 210 lb (95.3 kg)    BMI 33.89 kg/m  self reported vitals Gen: NAD, resting comfortably Lungs: nonlabored, normal respiratory rate  Skin: appears dry, no obvious rash  I personally reviewed chest x-ray from 07/07/2019 1.  Patchy infiltrates bilaterally-appears to be in left lower lung and right midlung field.  No pleural effusions or pneumothorax noted.  Mild cardiomegaly noted.  No bony abnormality.  I also personally reviewed chest x-ray from 07/09/2019 1.  Continued bilateral patchy infiltrates but more prominent than prior concerning for atypical pneumonia   No pleural effusions or pneumothorax noted.  Mild cardiomegaly noted.    Assessment and Plan:   Hospital F/u for COVID-19 S: Patient had been to the emergency room on November 6 for low back pain as well as November 7 for headache.  On November 6 there was concern he may have a kidney stone and was treated with Toradol.  On November 7 patient was tested for COVID-19 which came back positive in 2 days.  During November 7 emergency room visit-CT scan was obtained due to ongoing back issues-no stone was found but there was unlikely pneumonia found-patient was found to have increased work of breathing and oxygen saturations dropping into the 80s  but later oxygen levels improved-patient was offered admission but he wanted to go home.  Patient also declined head CT for headache.  He was started on doxycycline but had significant GI issues with this.  Patient returned to the hospital on November 9 after discovering positive COVID-19 diagnosis but also due to vomiting which he thought was related to the doxycycline.  Of note-patient is not sure where he got infected- wears mask regularly. Tried to practice social distancing. Helped a friend with front porch- was within 6 feet- he wore mask most of the time but friend did not- friend tested negative. Wife positive for COVID-19 as well-fortunately she is doing well despite being over age 63  By time of hospitalization patient developed bilateral infiltrates on x-ray.  Given hypoxia patient was given remdesivir, steroids and supplemental oxygen.  By discharge he was able to be weaned off of oxygen and was able to complete 5 doses of remdesivir.  He was discharged home on steroids.  Patient did initially have elevated CRP which trended down by discharge.  Procalcitonin was negative.  He was encouraged to  use incentive spirometry during hospitalization and at discharge.  Patient had mild LFT elevation likely related to Covid or remdesivir.  Also has history of fatty liver.  Patient is agreeable to come back in 1 month to have this rechecked.  Patient with history of Barrett's esophagus-he was continued on PPI.  Patient did have mild hypokalemia which was repleted during hospitalization.  Hyperlipidemia-patient was continued on statin given only very mild LFT elevation.  Today, patient reports he is doing well overall but is still experiencing some fatigue.  He feels he is making significant strides-Still with some chest congestion and cough. 5 day steroid given at discharge. Dark sputum changing to more cream color- looks more normal to him. Mild SOB if pushes himself but trying to take it easy.    A/P: 78 year old male recovering well from COVID-19 pneumonia.  Completed 5-day course of remdesivir.  He is completing Decadron today.  Encourage patient to be patient with his recovery.  Also I encouraged him to do an extended quarantine for 21 days given the extent of his disease to be on the safe side.  He asked me about traveling for Thanksgiving to spend time with some friends and I advised against this.  In regards to elevated LFTs-patient does agree to come by for CMP in 1 month.  That will also give [place] a chance to check in on prior hypokalemia.  We will order CBC and CMP.  Liver was noted on CT scan 04/20/2019 as well which could contribute  Of note-on prior CT scan on 04/20/2019 and infrarenal abdominal aortic aneurysm was noted with repeat planned in 3-year-we discussed this today and repeat planned in 3 years.  We will need to control his blood pressure but that has not been an issue  Of note-patient did decline Lovenox while hospitalized-advised him to watch closely for any signs or symptoms of DVT or PE.  Patient agrees to come by for lab work in early [place] will call to schedule  I offered to order PSA with labs in [place] would rather do that with urology to follow elevated PSA  Recommended follow up: Labs in early December then see me in April or sooner if needed Future Appointments  Date Time Provider [place]  08/02/2019  4:00 PM [facility]  12/03/2019  4:20 PM Marin Olp, MD [facility]   Lab/Order associations:   ICD-10-CM   1. Pneumonia due to COVID-19 virus  U07.1 CBC with Differential future   J12.89 CMET future  2. Acute respiratory failure due to COVID-19 (HCC)  U07.1    J96.00   3. Abdominal aortic aneurysm (AAA) without rupture (HCC)  I71.4   4. Fatty liver  K76.0   5. Hypokalemia  E87.6   6. Elevated LFTs  R79.89      Return precautions advised.  [name], MD

## 2019-07-17 NOTE — Patient Instructions (Signed)
Health Maintenance Due  Topic Date Due  . PNA vac Low Risk Adult (2 of 2 - PCV13)-will discuss next ov 08/22/2009   Depression screen [facility] [place] 2/9 06/04/2019 02/19/2019 10/23/2018  Decreased Interest 0 0 0  Down, Depressed, Hopeless 0 0 0  PHQ - 2 Score 0 0 0

## 2019-07-23 ENCOUNTER — Other Ambulatory Visit: Payer: Self-pay

## 2019-07-23 DIAGNOSIS — Z20828 Contact with and (suspected) exposure to other viral communicable diseases: Secondary | ICD-10-CM | POA: Diagnosis not present

## 2019-07-23 DIAGNOSIS — Z20822 Contact with and (suspected) exposure to covid-19: Secondary | ICD-10-CM

## 2019-07-25 LAB — NOVEL CORONAVIRUS, NAA: SARS-CoV-2, NAA: NOT DETECTED

## 2019-07-30 ENCOUNTER — Other Ambulatory Visit: Payer: Self-pay | Admitting: Family Medicine

## 2019-07-30 MED ORDER — DEXAMETHASONE 6 MG PO TABS: 6.0000 mg | ORAL_TABLET | ORAL | 0 refills | Status: DC

## 2019-07-30 NOTE — Telephone Encounter (Signed)
Copied from [place] [id number]. Topic: Quick Communication - Rx Refill/Question >> Jul 30, 2019 12:03 PM Parke Poisson wrote: Medication: dexamethasone (DECADRON) 6 MG tablet   Has the patient contacted their pharmacy? No (Agent: If no, request that the patient contact the pharmacy for the refill.It was prescribed when he was released form hospital. He feels he is better but still has congestion in lungs   Preferred Pharmacy (with phone number or street name): [id number] [address], [place] [phone] (Phone) [phone] (Fax)    Agent: Please be advised that RX refills may take up to 3 business days. We ask that you follow-up with your pharmacy.

## 2019-07-30 NOTE — Telephone Encounter (Signed)
Copied from [place] [id number]. Topic: Quick Communication - Rx Refill/Question >> Jul 30, 2019 12:03 PM Parke Poisson wrote: Medication: dexamethasone (DECADRON) 6 MG tablet   Has the patient contacted their pharmacy? No (Agent: If no, request that the patient contact the pharmacy for the refill.It was prescribed when he was released form hospital. He feels he is better but still has congestion in lungs   Preferred Pharmacy (with phone number or street name): [id number] [address], [place] [phone] (Phone) [phone] (Fax)    Agent: Please be advised that RX refills may take up to 3 business days. We ask that you follow-up with your pharmacy.

## 2019-07-30 NOTE — Telephone Encounter (Signed)
See note

## 2019-08-02 ENCOUNTER — Other Ambulatory Visit (INDEPENDENT_AMBULATORY_CARE_PROVIDER_SITE_OTHER): Payer: Medicare Other

## 2019-08-02 ENCOUNTER — Ambulatory Visit (INDEPENDENT_AMBULATORY_CARE_PROVIDER_SITE_OTHER): Payer: Medicare Other | Admitting: Family Medicine

## 2019-08-02 ENCOUNTER — Other Ambulatory Visit: Payer: Self-pay

## 2019-08-02 ENCOUNTER — Encounter: Payer: Self-pay | Admitting: Family Medicine

## 2019-08-02 VITALS — BP 130/80 | HR 75 | Temp 98.5°F | Ht 66.0 in | Wt 215.2 lb

## 2019-08-02 DIAGNOSIS — Z8619 Personal history of other infectious and parasitic diseases: Secondary | ICD-10-CM

## 2019-08-02 DIAGNOSIS — U071 COVID-19: Secondary | ICD-10-CM

## 2019-08-02 DIAGNOSIS — R0602 Shortness of breath: Secondary | ICD-10-CM

## 2019-08-02 DIAGNOSIS — R7989 Other specified abnormal findings of blood chemistry: Secondary | ICD-10-CM | POA: Diagnosis not present

## 2019-08-02 DIAGNOSIS — J1282 Pneumonia due to coronavirus disease 2019: Secondary | ICD-10-CM

## 2019-08-02 DIAGNOSIS — I2510 Atherosclerotic heart disease of native coronary artery without angina pectoris: Secondary | ICD-10-CM

## 2019-08-02 DIAGNOSIS — J1289 Other viral pneumonia: Secondary | ICD-10-CM | POA: Diagnosis not present

## 2019-08-02 NOTE — Patient Instructions (Addendum)
Health Maintenance Due  Topic Date Due  . PNA vac Low Risk Adult (2 of 2 - PCV13)-will get when he is better 08/22/2009   If d dimer is elevated we would plan to get a CT scan tomorrow- I feel better about potentially waiting to do this based on oxygen levels being similar to prior levels in office.   Try to start increasing activity levels a little bit each day as long as all the tests come back ok.   If you have worsening shortness of breath or oxygen levels drop below 90% persistently please let me know ASAP

## 2019-08-02 NOTE — Progress Notes (Addendum)
Phone [phone] In person visit   Subjective:   Jose [name] is a 78 y.o. year old very pleasant male patient who presents for/with See problem oriented charting Chief Complaint  Patient presents with  . Follow-up   ROS- complains of feeling winded with activity.  No chest pain.  No leg swelling or edema.  This visit occurred during the SARS-CoV-2 public health emergency.  Safety protocols were in place, including screening questions prior to the visit, additional usage of staff PPE, and extensive cleaning of exam room while observing appropriate contact time as indicated for disinfecting solutions.   Past Medical History-  Patient Active Problem List   Diagnosis Date Noted  . Acute respiratory failure due to COVID-19 [facility]) 07/09/2019    Priority: High  . Aortic aneurysm ([place]) 06/04/2019    Priority: High  . Atherosclerosis of coronary artery 02/09/2007    Priority: High  . Barrett's esophagus 02/09/2007    Priority: High  . Fatty liver 06/04/2019    Priority: Medium  . GERD (gastroesophageal reflux disease) 10/23/2018    Priority: Medium  . Primary osteoarthritis of left knee 02/27/2018    Priority: Medium  . Elevated PSA 08/03/2016    Priority: Medium  . Benign prostatic hypertrophy (BPH) with nocturia 05/27/2016    Priority: Medium  . Hyperlipidemia 02/09/2007    Priority: Medium  . Aortic atherosclerosis ([place]) 06/04/2019    Priority: Low  . Acute prostatitis 02/21/2019    Priority: Low  . Nasal congestion 10/23/2018    Priority: Low  . Sinus bradycardia 03/19/2015    Priority: Low  . Diverticulosis of colon with hemorrhage     Priority: Low  . Actinic keratosis 08/07/2014    Priority: Low  . Elevated blood pressure 03/29/2014    Priority: Low  . Hyperglycemia 02/26/2013    Priority: Low  . OBESITY 03/20/2010    Priority: Low  . Generalized anxiety disorder 08/22/2008    Priority: Low  . ERECTILE DYSFUNCTION 08/22/2008    Priority: Low  .  Diverticulosis of large intestine 02/09/2007    Priority: Low  . Thrombocytopenia ([place]) 07/12/2019  . LFT elevation 07/12/2019  . Lumbosacral radiculopathy 06/17/2015  . Numbness 06/17/2015  . Right leg numbness   . Acute posthemorrhagic anemia 10/17/2014  . Gastrointestinal hemorrhage 10/15/2014    Medications- reviewed and updated Current Outpatient Medications  Medication Sig Dispense Refill  . aspirin EC 81 MG tablet Take 1 tablet (81 mg total) by mouth 2 (two) times daily. For DVT prophylaxis (Patient taking differently: Take 81 mg by mouth daily. For DVT prophylaxis) 60 tablet 0  . dexamethasone (DECADRON) 6 MG tablet Take 1 tablet (6 mg total) by mouth daily. 5 tablet 0  . omeprazole (PRILOSEC) 40 MG capsule Take 1 capsule (40 mg total) by mouth daily. 90 capsule 3  . rosuvastatin (CRESTOR) 20 MG tablet TAKE 1 TABLET DAILY (Patient taking differently: Take 20 mg by mouth daily. ) 90 tablet 3  . tadalafil (ADCIRCA/CIALIS) 20 MG tablet TAKE ONE-HALF (1/2) TABLET DAILY AS NEEDED FOR ERECTILE DYSFUNCTION (Patient taking differently: Take 10 mg by mouth daily as needed for erectile dysfunction. ) 8 tablet 18  . traMADol (ULTRAM) 50 MG tablet Take 1-2 tablets (50-100 mg total) by mouth 2 (two) times daily as needed for severe pain. Post-operatively 60 tablet 3   No current facility-administered medications for this visit.      Objective:  BP 130/80   Pulse 75   Temp 98.5 F (  36.9 C)   Ht 5\' 6"  (1.676 m)   Wt 215 lb 3.2 oz (97.6 kg)   SpO2 93%   BMI 34.73 kg/m  Gen: NAD, resting comfortably CV: RRR no murmurs rubs or gallops Lungs: CTAB no crackles, wheeze, rhonchi, no respiratory distress Abdomen: soft/nontender/nondistended/normal bowel sounds. No rebound or guarding.  Ext: Trace edema bilaterally without calf pain Skin: warm, dry     Assessment and Plan  #COVID-19 S: Patient with Covid pneumonia-last seen for hospital follow-up on November 17 virtually.  He was coming  in today to monitor LFTs due to prior elevations as well as hypokalemia.  We did an extended 21 day quarantine and he is now out of that period.  Our phlebotomist reported to me today that patient had noted low oxygen levels at home and was complaining of some shortness of breath with activity as well as high heart rates. Patient did decline Lovenox while hospitalized.  I was concerned about potential pulmonary embolism and requested a D-dimer be added to labs and for patient to be worked in for a visit today.  When I saw patient-he states he just bought a pulse oximeter in the last week and he has been noticing his o2 has been running low and he is winded with activity.  He reports his levels usually arey running 91 to 92 percent (rarely down to 89%).  His oxygen level in office today was 93% which is consistent with the prior reading for last in-person visit and only 1% is below visit before that.HR noted may be up to 110 at night then was normal next day.  Heart rate is normal today  I was concerned patient was having worsening shortness of breath but he reports his symptoms have simply been slow to improve but he also reports he has been very sedentary. He does not know what oxygen levels were in last few weeks- just recently got the device.   Decadron was refilled per protocol on 07/30/2019.  He also wanted to make sure his lungs were clear today. A/P: Patient with continued shortness of breath after COVID-19-oxygen levels appear consistent with prior readings-home readings are slightly low in 91 to 92% range and can get as low as 89% with activity but resolved with deep breaths.  He asks if he would qualify for oxygen I told him that present he would not-if you would like to purchase this out of pocket he could but he does not want to do that.  He feels like he feels better on the Decadron and he would like to complete this course. -I still think it is reasonable to follow through with the D-dimer  test to rule out pulmonary embolism since he declined Lovenox while hospitalized.  Since his symptoms have been so stable I would not send him to the hospital unless he has worsening symptoms from this-instead would order stat outpatient imaging -Strongly consider chest x-ray but ultimately opted against as patient would get CT imaging which would cover this tomorrow if D-dimer elevated  Recommended follow up: As needed for acute concern-otherwise follow-up in April as planned Future Appointments  Date Time Provider [place]  12/03/2019  4:20 PM Marin Olp, MD [facility]   Lab/Order associations:   ICD-10-CM   1. COVID-19  U07.1   2. Shortness of breath  R06.02    Return precautions advised.  [name], MD

## 2019-08-03 ENCOUNTER — Other Ambulatory Visit: Payer: Medicare Other

## 2019-08-03 ENCOUNTER — Ambulatory Visit
Admission: RE | Admit: 2019-08-03 | Discharge: 2019-08-03 | Disposition: A | Discharge status: Home / Self Care | Payer: Medicare Other | Source: Ambulatory Visit | Attending: Family Medicine | Admitting: Family Medicine

## 2019-08-03 DIAGNOSIS — R0602 Shortness of breath: Secondary | ICD-10-CM | POA: Diagnosis not present

## 2019-08-03 DIAGNOSIS — R7989 Other specified abnormal findings of blood chemistry: Secondary | ICD-10-CM

## 2019-08-03 LAB — COMPREHENSIVE METABOLIC PANEL
ALT: 19 U/L (ref 0–53)
AST: 18 U/L (ref 0–37)
Albumin: 3.5 g/dL (ref 3.5–5.2)
Alkaline Phosphatase: 97 U/L (ref 39–117)
BUN: 15 mg/dL (ref 6–23)
CO2: 21 mEq/L (ref 19–32)
Calcium: 9.1 mg/dL (ref 8.4–10.5)
Chloride: 107 mEq/L (ref 96–112)
Creatinine, Ser: 0.93 mg/dL (ref 0.40–1.50)
GFR: 79.01 mL/min (ref >60.00)
Glucose, Bld: 149 mg/dL — ABNORMAL HIGH (ref 70–99)
Potassium: 4.2 mEq/L (ref 3.5–5.1)
Sodium: 137 mEq/L (ref 135–145)
Total Bilirubin: 0.4 mg/dL (ref 0.2–1.2)
Total Protein: 6.9 g/dL (ref 6.0–8.3)

## 2019-08-03 LAB — CBC WITH DIFFERENTIAL/PLATELET
Basophils Absolute: 0 10*3/uL (ref 0.0–0.1)
Basophils Relative: 0.2 % (ref 0.0–3.0)
Eosinophils Absolute: 0 10*3/uL (ref 0.0–0.7)
Eosinophils Relative: 0.1 % (ref 0.0–5.0)
HCT: 44.4 % (ref 39.0–52.0)
Hemoglobin: 14.8 g/dL (ref 13.0–17.0)
Lymphocytes Relative: 6.5 % — ABNORMAL LOW (ref 12.0–46.0)
Lymphs Abs: 0.6 10*3/uL — ABNORMAL LOW (ref 0.7–4.0)
MCHC: 33.5 g/dL (ref 30.0–36.0)
MCV: 87.6 fl (ref 78.0–100.0)
Monocytes Absolute: 0.3 10*3/uL (ref 0.1–1.0)
Monocytes Relative: 3.7 % (ref 3.0–12.0)
Neutro Abs: 8.2 10*3/uL — ABNORMAL HIGH (ref 1.4–7.7)
Neutrophils Relative %: 89.5 % — ABNORMAL HIGH (ref 43.0–77.0)
Platelets: 187 10*3/uL (ref 150.0–400.0)
RBC: 5.07 Mil/uL (ref 4.22–5.81)
RDW: 14.6 % (ref 11.5–15.5)
WBC: 9.2 10*3/uL (ref 4.0–10.5)

## 2019-08-03 LAB — D-DIMER, QUANTITATIVE: D-Dimer, Quant: 3.78 mcg/mL FEU — ABNORMAL HIGH (ref <0.50)

## 2019-08-03 MED ORDER — AZITHROMYCIN 250 MG PO TABS: ORAL_TABLET | ORAL | 0 refills | Status: DC

## 2019-08-03 MED ORDER — IOPAMIDOL (ISOVUE-370) INJECTION 76%
75.0000 mL | Freq: Once | INTRAVENOUS | Status: AC | PRN
  Administered 2019-08-03: 75 mL via INTRAVENOUS

## 2019-08-03 NOTE — Addendum Note (Signed)
Addended by: Marin Olp on: 08/03/2019 06:18 PM   Modules accepted: Orders

## 2019-08-03 NOTE — Addendum Note (Signed)
Addended by: Marin Olp on: 08/03/2019 07:46 AM   Modules accepted: Orders

## 2019-09-05 ENCOUNTER — Telehealth: Payer: Self-pay | Admitting: Family Medicine

## 2019-09-05 NOTE — Telephone Encounter (Signed)
Jose [name] called wanting to know if someone can call him to speak about the covid vaccine and to see if he would be a good candidate or not just due to him having covid in the past.

## 2019-09-05 NOTE — Telephone Encounter (Signed)
Patient had covid 19 last month. Wanted to know if he needs to get vaccine.

## 2019-09-06 NOTE — Telephone Encounter (Signed)
From CDC site :  "If I have already had COVID-19 and recovered, do I still need to get vaccinated with a COVID-19 vaccine? Yes. Due to the severe health risks associated with COVID-19 and the fact that re-infection with COVID-19 is possible, vaccine should be offered to you regardless of whether you already had COVID-19 infection. CDC is providing recommendations to federal, state, and local governments about who should be vaccinated first.  At this time, experts do not know how long someone is protected from getting sick again after recovering from COVID-19. The immunity someone gains from having an infection, called natural immunity, varies from person to person. Some early evidence suggests natural immunity may not last very long.  We won't know how long immunity produced by vaccination lasts until we have more data on how well the vaccines work.  Both natural immunity and vaccine-induced immunity are important aspects of COVID-19 that experts are trying to learn more about, and CDC will keep the public informed as new evidence becomes available."

## 2019-09-06 NOTE — Telephone Encounter (Signed)
Called l/m to let patient know I would send information to him in my chart message and to give office a call if any questions.

## 2019-09-12 DIAGNOSIS — R972 Elevated prostate specific antigen [PSA]: Secondary | ICD-10-CM | POA: Diagnosis not present

## 2019-09-14 ENCOUNTER — Telehealth: Payer: Self-pay | Admitting: Family Medicine

## 2019-09-14 NOTE — Telephone Encounter (Signed)
I left a message asking the patient to call and schedule Medicare AWV with Loma Sousa ([place]).  If patient calls back, please schedule Medicare Wellness Visit at next available opening. Last AWV 08/03/16 V[facility] ([place])

## 2019-10-19 ENCOUNTER — Ambulatory Visit: Payer: Medicare Other

## 2019-10-21 ENCOUNTER — Ambulatory Visit: Payer: Medicare Other | Attending: Internal Medicine

## 2019-10-21 DIAGNOSIS — Z23 Encounter for immunization: Secondary | ICD-10-CM | POA: Insufficient documentation

## 2019-10-21 NOTE — Progress Notes (Signed)
   Covid-19 Vaccination Clinic  Name:  Jose [name]    MRN: [id number] DOB: 12/12/1940  10/21/2019  [name] was observed post Covid-19 immunization for 15 minutes without incidence. He was provided with Vaccine Information Sheet and instruction to access the V-Safe system.   [name] was instructed to call 911 with any severe reactions post vaccine: Mar[place] Kitchen Difficulty breathing  . Swelling of your face and throat  . A fast heartbeat  . A bad rash all over your body  . Dizziness and weakness    Immunizations Administered    Name Date Dose VIS Date Route   Pfizer COVID-19 Vaccine 10/21/2019  9:00 AM 0.3 mL 08/10/2019 Intramuscular   Manufacturer: [place]   Lot: [id number]   [place]: [id number]

## 2019-10-22 ENCOUNTER — Telehealth: Payer: Self-pay

## 2019-10-22 IMAGING — CR DG WRIST COMPLETE 3+V*L*
4 series · 4 of 4 positions shown · non-contrast
Comparison: None.

CLINICAL DATA: Pt reports falling down the stairs this morning. Pt
denies loosing consciousness. Pt is alert and oriented in triage. Pt
reports pain to left wrist, rt middle finger, and noted to have
abrasions and swelling above the left eye.

EXAM:
LEFT WRIST - COMPLETE 3+ VIEW

[wrist pa]
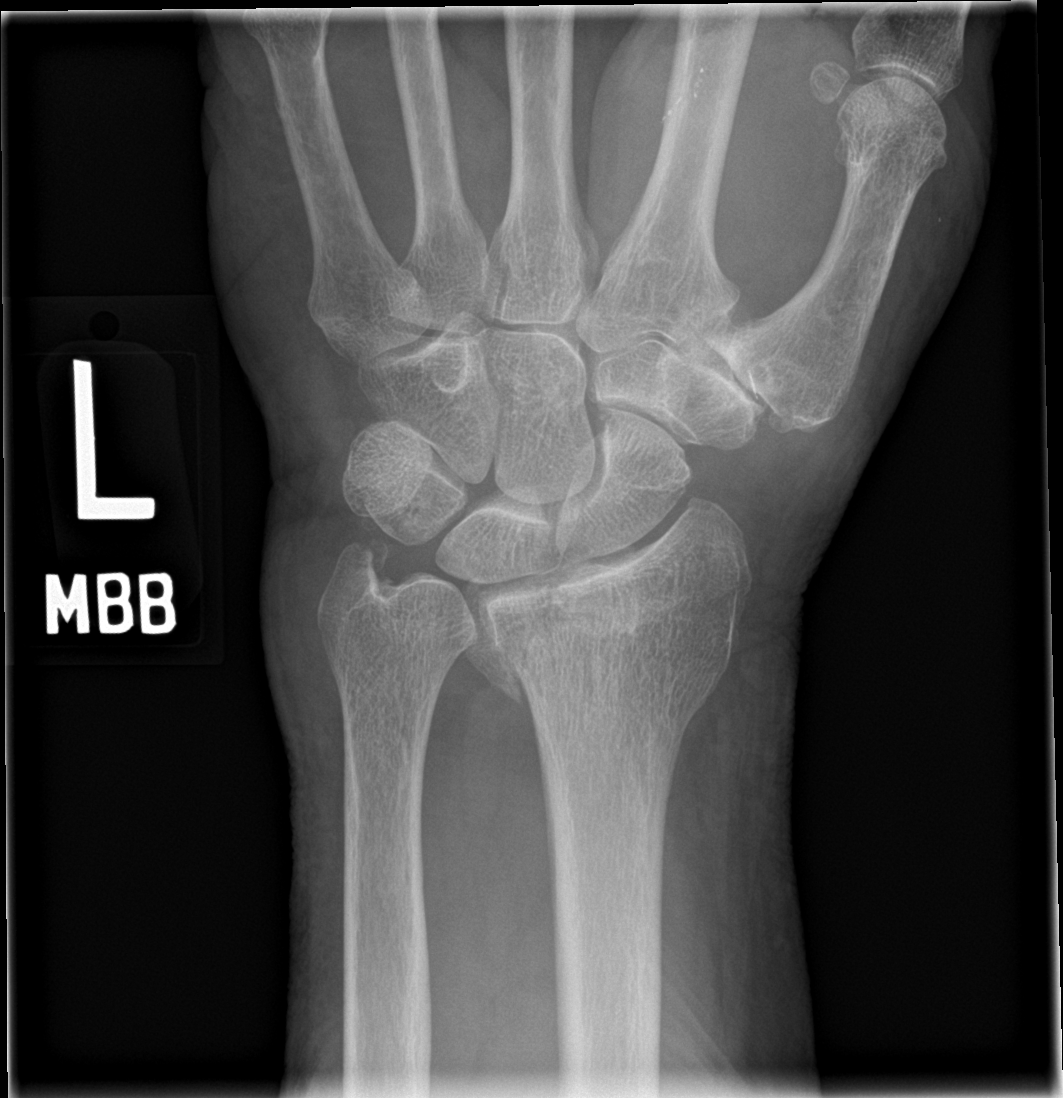

[wrist obl]
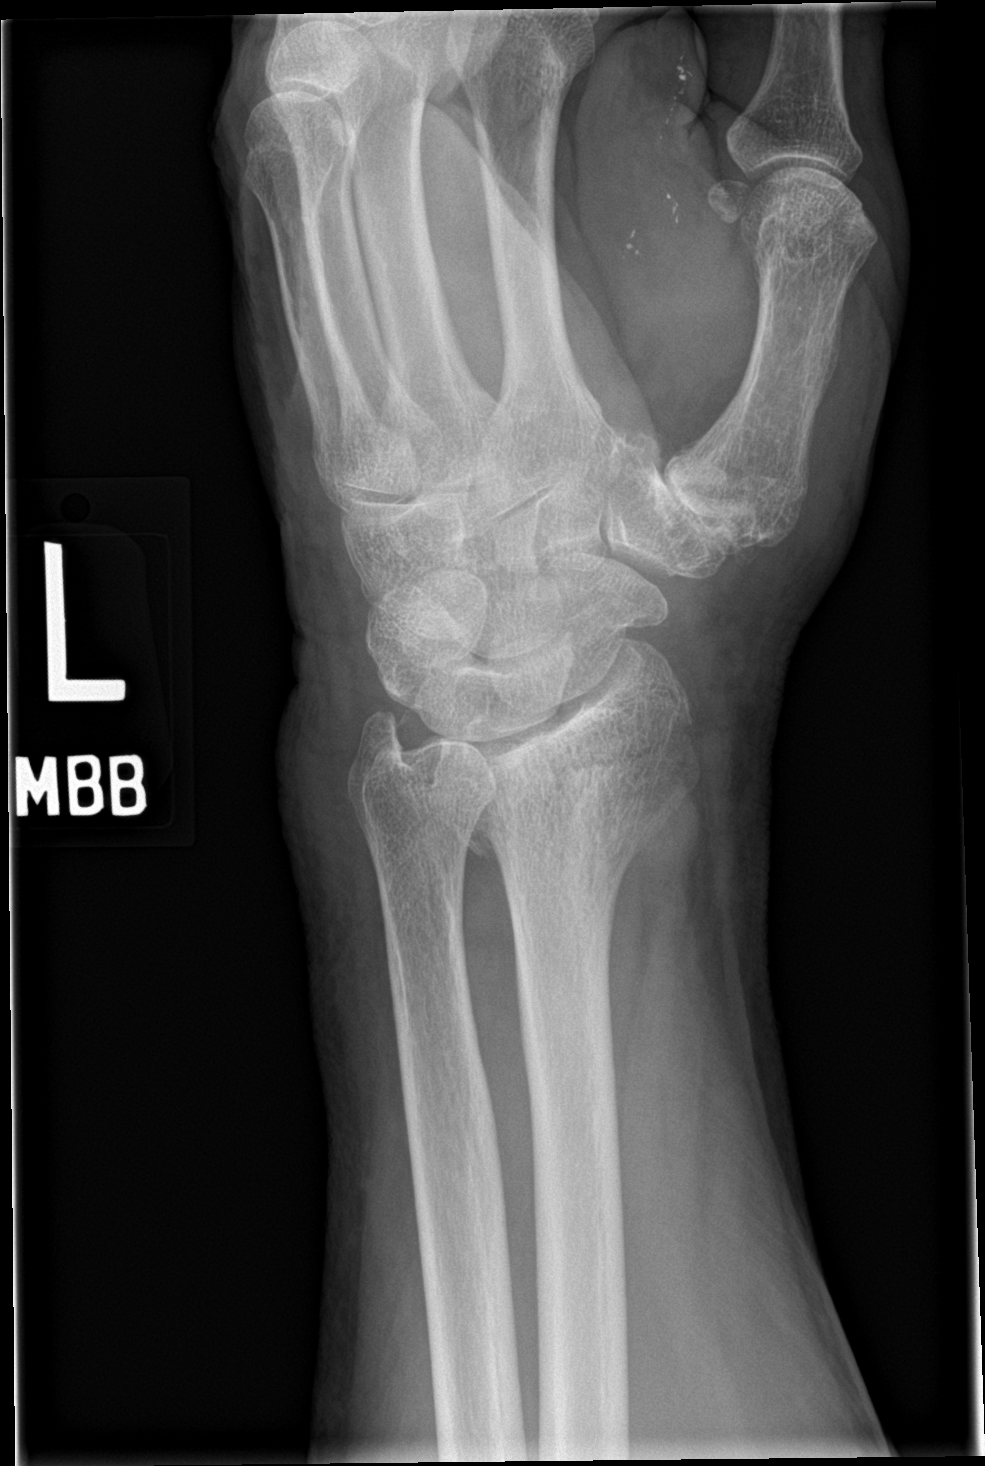

[wrist lat]
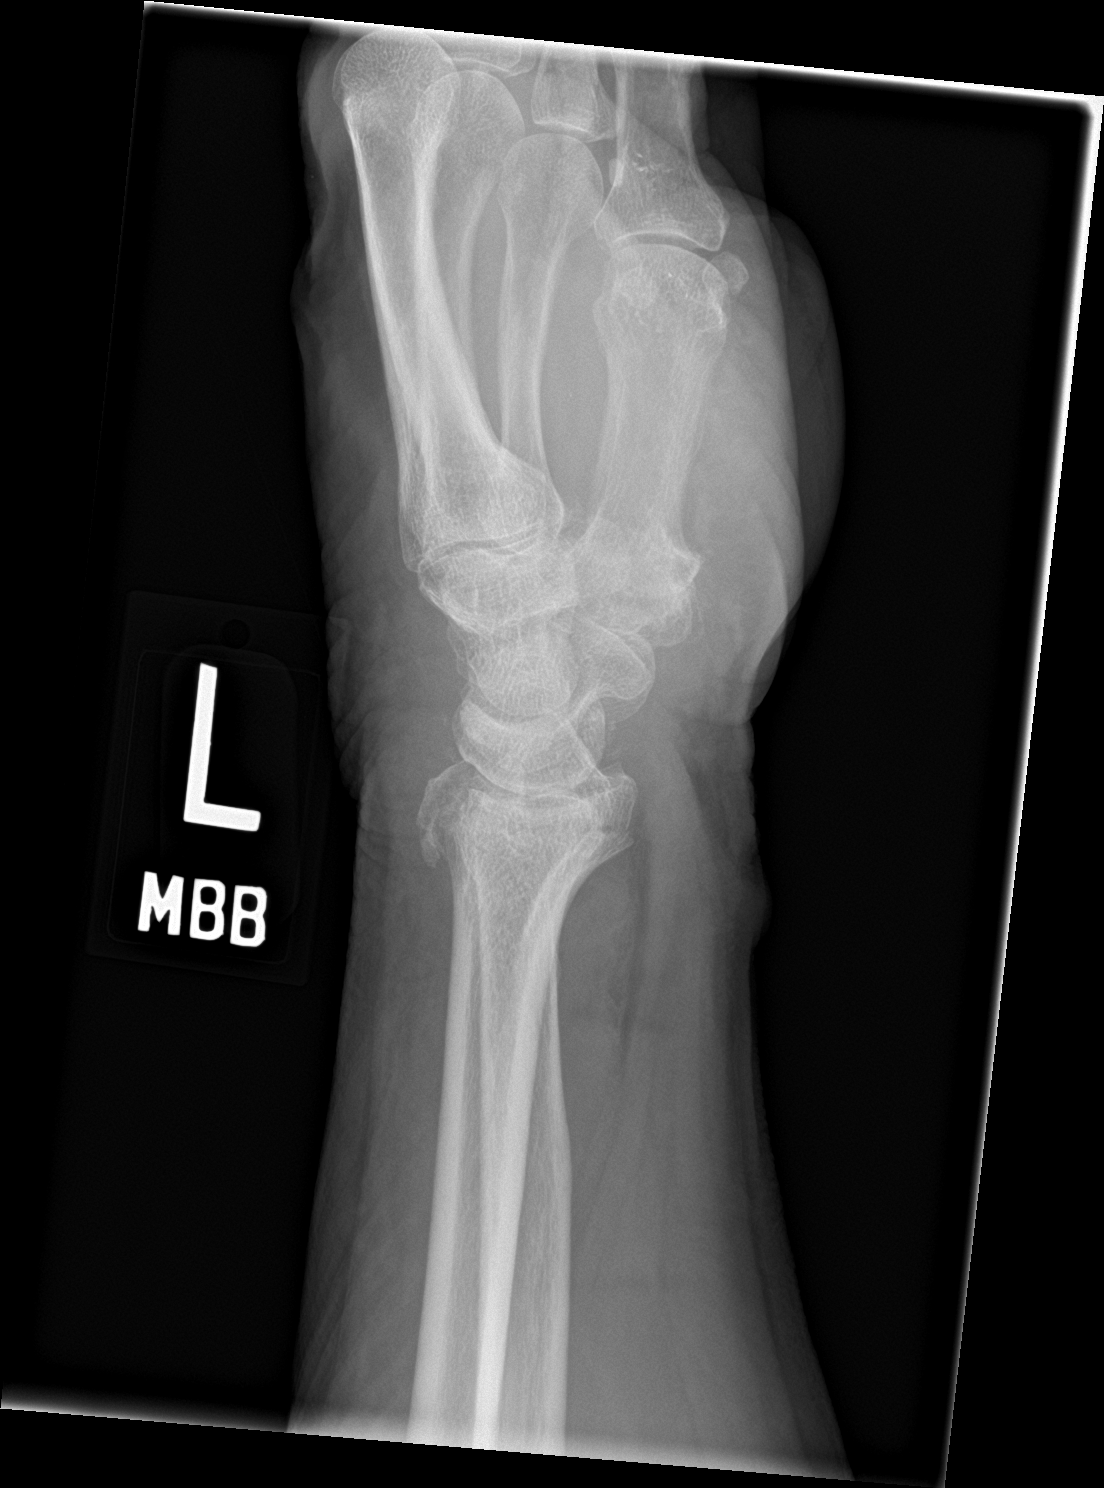

[wrist navicular]
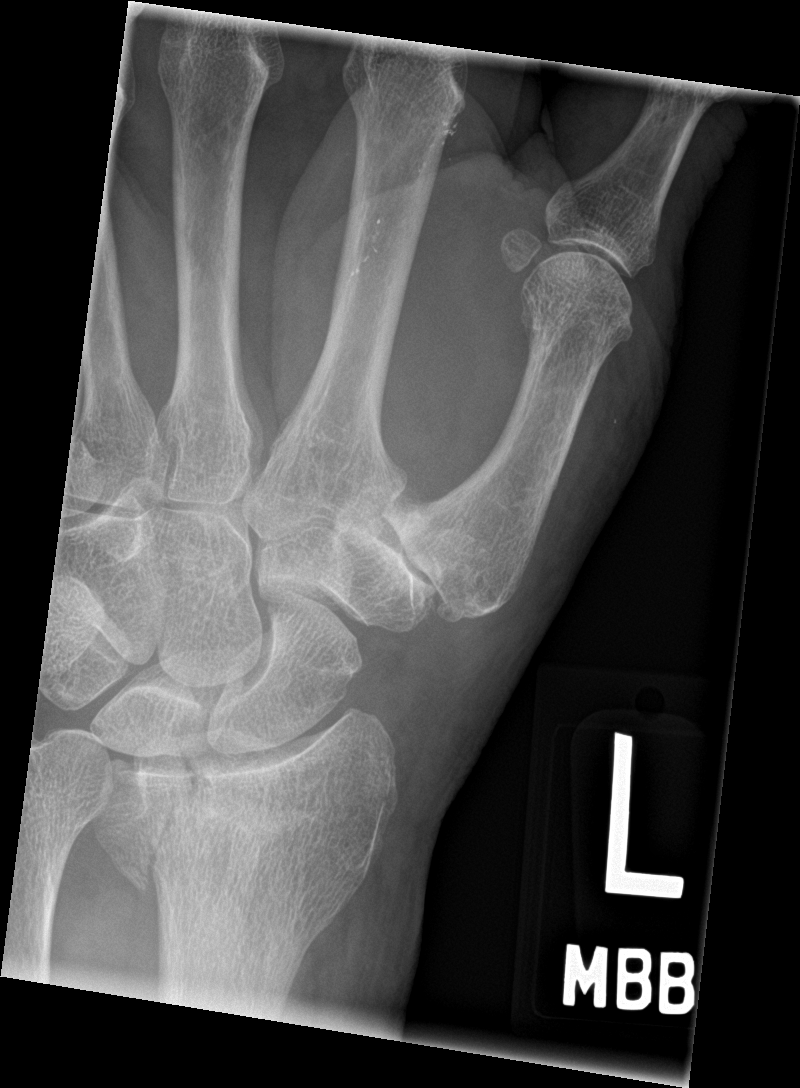

[4 of 4 positions shown; findings below may reference images not displayed]

FINDINGS: There is a comminuted fracture of the distal radius. Is a transverse
fracture component across the metaphysis with separate fracture
components intersecting the articular surface at the lunate facet.
Fracture is mildly impacted leading to 10 degrees of dorsal
angulation of the articular surface. There is a small associated
ulnar styloid fracture.

No other fractures.  No dislocation.

There is surrounding soft tissue swelling.

Bones are demineralized.
IMPRESSION: 1. Comminuted fracture of the distal radius, mildly impacted with 10
degrees of dorsal angulation of the articular surface.
2. Small associated ulnar styloid fracture.
3. No dislocation.

## 2019-10-22 NOTE — Telephone Encounter (Signed)
Patient received the covid vaccine Sunday 10/21/19 9am. Patient is now experiencing chills and nausea. Patient previously tested positive for covid in Nov 2020. Patient states that he did have a fever this morning. Oxygen level seems to be where it should be. Patient also experiencing body aches and was told not take tylenol before the covid vaccine but he want to know it is okay to take tylenol now since he is having all of these side effects. Please return patient call as soon as possible.

## 2019-10-22 NOTE — Telephone Encounter (Signed)
Called and spoke with pt and assured pt this was normal reaction to COVID shot and that he can take tylenol as needed.

## 2019-11-13 ENCOUNTER — Ambulatory Visit: Payer: Medicare Other | Attending: Internal Medicine

## 2019-11-13 DIAGNOSIS — Z23 Encounter for immunization: Secondary | ICD-10-CM

## 2019-11-13 NOTE — Progress Notes (Signed)
   Covid-19 Vaccination Clinic  Name:  Jose [name]    MRN: [id number] DOB: 1940/10/21  11/13/2019  [name] was observed post Covid-19 immunization for 15 minutes without incident. He was provided with Vaccine Information Sheet and instruction to access the V-Safe system.   [name] was instructed to call 911 with any severe reactions post vaccine: Marland Kitchen Difficulty breathing  . Swelling of face and throat  . A fast heartbeat  . A bad rash all over body  . Dizziness and weakness   Immunizations Administered    Name Date Dose VIS Date Route   Pfizer COVID-19 Vaccine 11/13/2019  3:43 PM 0.3 mL 08/10/2019 Intramuscular   Manufacturer: [place]   Lot: [id number]   [place]: [id number]

## 2019-12-03 ENCOUNTER — Ambulatory Visit (INDEPENDENT_AMBULATORY_CARE_PROVIDER_SITE_OTHER): Payer: Medicare Other | Admitting: Family Medicine

## 2019-12-03 ENCOUNTER — Ambulatory Visit (INDEPENDENT_AMBULATORY_CARE_PROVIDER_SITE_OTHER): Payer: Medicare Other

## 2019-12-03 ENCOUNTER — Encounter: Payer: Self-pay | Admitting: Family Medicine

## 2019-12-03 ENCOUNTER — Other Ambulatory Visit: Payer: Self-pay

## 2019-12-03 VITALS — BP 122/80 | HR 58 | Temp 98.3°F | Ht 66.0 in | Wt 222.0 lb

## 2019-12-03 DIAGNOSIS — I714 Abdominal aortic aneurysm, without rupture, unspecified: Secondary | ICD-10-CM

## 2019-12-03 DIAGNOSIS — K2270 Barrett's esophagus without dysplasia: Secondary | ICD-10-CM

## 2019-12-03 DIAGNOSIS — K219 Gastro-esophageal reflux disease without esophagitis: Secondary | ICD-10-CM

## 2019-12-03 DIAGNOSIS — E785 Hyperlipidemia, unspecified: Secondary | ICD-10-CM

## 2019-12-03 DIAGNOSIS — M1712 Unilateral primary osteoarthritis, left knee: Secondary | ICD-10-CM | POA: Diagnosis not present

## 2019-12-03 DIAGNOSIS — Z0000 Encounter for general adult medical examination without abnormal findings: Secondary | ICD-10-CM | POA: Diagnosis not present

## 2019-12-03 DIAGNOSIS — R739 Hyperglycemia, unspecified: Secondary | ICD-10-CM | POA: Diagnosis not present

## 2019-12-03 DIAGNOSIS — K760 Fatty (change of) liver, not elsewhere classified: Secondary | ICD-10-CM

## 2019-12-03 DIAGNOSIS — I2510 Atherosclerotic heart disease of native coronary artery without angina pectoris: Secondary | ICD-10-CM

## 2019-12-03 MED ORDER — TRAMADOL HCL 50 MG PO TABS: 50.0000 mg | ORAL_TABLET | Freq: Two times a day (BID) | ORAL | 5 refills | Status: DC | PRN

## 2019-12-03 NOTE — Patient Instructions (Addendum)
Health Maintenance Due  Topic Date Due  . PNA vac Low Risk Adult (2 of 2 - PCV13)- consider in 6 months 08/22/2009  . TETANUS/TDAP - need this if you get a cut/scrape. Can also get at pharmacy for prevention  08/30/2013   Thanks for doing labs  May consider cutting down on rosuvastatin depending on cholesterol #s  Need to lose to prevent diabetes and worsening of fatty liver  myfitnesspal is a good app to hep you count calories that can help you lose weight  Recommended follow up: Return in about 6 months (around 06/03/2020) for follow up- or sooner if needed.

## 2019-12-03 NOTE — Progress Notes (Signed)
This visit is being conducted via phone call due to the COVID-19 pandemic. This patient has given me verbal consent via phone to conduct this visit, patient states they are participating from their home address. Some vital signs may be absent or patient reported.   Patient identification: identified by name, DOB, and current address.  Location provider: [facility], Office Persons participating in the virtual visit: Denman George LPN, patient, and Dr. [name]    Subjective:   Jose [name] is a 79 y.o. male who presents for Medicare Annual/Subsequent preventive examination.  Review of Systems:   Cardiac Risk Factors include: advanced age (>95men, >14 women);male gender;dyslipidemia    Objective:    Vitals: There were no vitals taken for this visit.  There is no height or weight on file to calculate BMI.  Advanced Directives 12/03/2019 07/10/2019 07/07/2019 09/06/2018 03/22/2018 03/10/2018 12/30/2016  Does Patient Have a Medical Advance Directive? Yes No No No No No No  Type of Paramedic of [place];Living will - - - - - -  Does patient want to make changes to medical advance directive? No - Patient declined - - - - - -  Copy of [place] in Chart? No - copy requested - - - - - -  Would patient like information on creating a medical advance directive? - No - Patient declined No - Patient declined No - Patient declined No - Patient declined No - Patient declined No - Patient declined  Pre-existing out of facility DNR order (yellow form or pink MOST form) - - - - - - -    Tobacco Social History   Tobacco Use  Smoking Status Former Smoker  . Packs/day: 1.00  . Years: 30.00  . Pack years: 30.00  . Types: Cigarettes  Smokeless Tobacco Never Used  Tobacco Comment   "quit smoking cigarettes in ~ 1990"     Counseling given: Not Answered Comment: "quit smoking cigarettes in ~ 1990"   Clinical Intake:  Pre-visit preparation  completed: Yes  Pain : No/denies pain  Diabetes: No  How often do you need to have someone help you when you read instructions, pamphlets, or other written materials from your doctor or pharmacy?: 1 - Never  Interpreter Needed?: No  Information entered by :: Denman George LPN  Past Medical History:  Diagnosis Date  . Acute lower GI bleeding 01/25/2013; 09/2014   diverticular bleed  . Acute prostatitis 02/21/2019  . ANXIETY 08/22/2008  . Arthritis    "a little bit" (04/14/2015)  . Barrett's esophagus 2003   hematochezia as a result since 2003  . Basal cell cancer    removed from right forearm.   . Bradycardia 04/14/2015  . CORONARY ARTERY DISEASE    2 stents  . Depression   . DIVERTICULOSIS, COLON 2003  . ERECTILE DYSFUNCTION 08/22/2008  . GERD (gastroesophageal reflux disease)   . Heart murmur 04/14/2015   "they think I did; that's why I'm here" (04/14/2015)  . History of kidney stones   . HYPERLIPIDEMIA 02/09/2007  . Kidney stones    "blasted or passed" (04/14/2015)  . MI (myocardial infarction) ([place]) 2000  . Numbness    right side - history of   . OBESITY 03/20/2010  . Squamous cell cancer of skin of earlobe    removed from area behind right earlobe  . Stroke [facility])    4 yrs ago possible mini stroke -  tests never found anything   Past Surgical  History:  Procedure Laterality Date  . BASAL CELL CARCINOMA EXCISION     posterior right ear.   Marland Kitchen CATARACT EXTRACTION, BILATERAL    . COLONOSCOPY N/A 01/26/2013   Procedure: COLONOSCOPY;  Surgeon: Milus Banister, MD;  Location: [place];  Service: Endoscopy;  Laterality: N/A;  . CORONARY ANGIOPLASTY WITH STENT PLACEMENT  09/06/1998   LAD stent per Dr Elonda Husky in [place]. 2 stents.   . CYSTOSCOPY W/ URETERAL STENT PLACEMENT Left 12/21/2016   Procedure: CYSTOSCOPY WITH RETROGRADE PYELOGRAM/URETERAL STENT PLACEMENT;  Surgeon: Alexis Frock, MD;  Location: [facility];  Service: Urology;  Laterality: Left;  .  ESOPHAGOGASTRODUODENOSCOPY (EGD) WITH ESOPHAGEAL DILATION     "q time I have an EGD; usually q 3-4 years" (04/14/2015)  . EXTRACORPOREAL SHOCK WAVE LITHOTRIPSY Left 12/30/2016   Procedure: LEFT EXTRACORPOREAL SHOCK WAVE LITHOTRIPSY (ESWL);  Surgeon: Carolan Clines, MD;  Location: [facility];  Service: Urology;  Laterality: Left;  . LITHOTRIPSY  X 2  . SQUAMOUS CELL CARCINOMA EXCISION     right forearm  . TONSILLECTOMY  1971  . TOTAL KNEE ARTHROPLASTY Left 03/21/2018  . TOTAL KNEE ARTHROPLASTY Left 03/21/2018   Procedure: LEFT TOTAL KNEE ARTHROPLASTY;  Surgeon: Renette Butters, MD;  Location: [place];  Service: Orthopedics;  Laterality: Left;  . UPPER GASTROINTESTINAL ENDOSCOPY    . URETERAL STENT PLACEMENT     2019   Family History  Problem Relation Age of Onset  . Alcohol abuse Father   . Liver disease Father   . Heart disease Brother        CABG  . Colon cancer Neg Hx    Social History   Socioeconomic History  . Marital status: Married    Spouse name: Not on file  . Number of children: Not on file  . Years of education: Not on file  . Highest education level: Not on file  Occupational History  . Not on file  Tobacco Use  . Smoking status: Former Smoker    Packs/day: 1.00    Years: 30.00    Pack years: 30.00    Types: Cigarettes  . Smokeless tobacco: Never Used  . Tobacco comment: "quit smoking cigarettes in ~ 1990"  Substance and Sexual Activity  . Alcohol use: Yes    Comment: 04/14/2015 "might drink a couple beers/month"  . Drug use: No  . Sexual activity: Never  Other Topics Concern  . Not on file  Social History Narrative   Still working at [employer]   Married 35 years in 2015, 4 kids from first marriage, 3 grandkids   Hobbies: woodworking, tv movies- old action movies like Rambo   Social Determinants of Health   Financial Resource Strain:   . Difficulty of Paying Living Expenses:   Food Insecurity:   . Worried About Charity fundraiser in the Last  Year:   . Arboriculturist in the Last Year:   Transportation Needs:   . Film/video editor (Medical):   Marland Kitchen Lack of Transportation (Non-Medical):   Physical Activity:   . Days of Exercise per Week:   . Minutes of Exercise per Session:   Stress:   . Feeling of Stress :   Social Connections:   . Frequency of Communication with Friends and Family:   . Frequency of Social Gatherings with Friends and Family:   . Attends Religious Services:   . Active Member of Clubs or Organizations:   . Attends Archivist Meetings:   .  Marital Status:     Outpatient Encounter Medications as of 12/03/2019  Medication Sig  . aspirin EC 81 MG tablet Take 1 tablet (81 mg total) by mouth 2 (two) times daily. For DVT prophylaxis (Patient taking differently: Take 81 mg by mouth daily. For DVT prophylaxis)  . omeprazole (PRILOSEC) 40 MG capsule Take 1 capsule (40 mg total) by mouth daily.  . rosuvastatin (CRESTOR) 20 MG tablet TAKE 1 TABLET DAILY (Patient taking differently: Take 20 mg by mouth daily. )  . tadalafil (ADCIRCA/CIALIS) 20 MG tablet TAKE ONE-HALF (1/2) TABLET DAILY AS NEEDED FOR ERECTILE DYSFUNCTION (Patient taking differently: Take 10 mg by mouth daily as needed for erectile dysfunction. )  . traMADol (ULTRAM) 50 MG tablet Take 1-2 tablets (50-100 mg total) by mouth 2 (two) times daily as needed for severe pain. Post-operatively  . [DISCONTINUED] azithromycin (ZITHROMAX) 250 MG tablet Take 2 tabs on day 1, then 1 tab daily until finished  . [DISCONTINUED] dexamethasone (DECADRON) 6 MG tablet Take 1 tablet (6 mg total) by mouth daily.   No facility-administered encounter medications on file as of 12/03/2019.    Activities of Daily Living In your present state of health, do you have any difficulty performing the following activities: 12/03/2019 07/10/2019  Hearing? N Y  Vision? N N  Difficulty concentrating or making decisions? N N  Walking or climbing stairs? N N  Dressing or bathing? N N   Doing errands, shopping? N N  Preparing Food and eating ? N -  Using the Toilet? N -  In the past six months, have you accidently leaked urine? N -  Do you have problems with loss of bowel control? N -  Managing your Medications? N -  Managing your Finances? N -  Housekeeping or managing your Housekeeping? N -  Some recent data might be hidden    Patient Care Team: Marin Olp, MD as PCP - General (Family Medicine) [facility], [facility] Ladene Artist, MD as Consulting Physician (Gastroenterology) Talbert Cage, NP as Nurse Practitioner (Cardiology)   Assessment:   This is a routine wellness examination for [place].  Exercise Activities and Dietary recommendations Current Exercise Habits: The patient does not participate in regular exercise at present  Goals   None     Fall Risk Fall Risk  12/03/2019 02/19/2019 12/19/2017 08/03/2016 06/16/2015  Falls in the past year? 0 0 Yes No No  Number falls in past yr: 0 0 1 - -  Injury with Fall? 0 0 Yes - -  Risk for fall due to : - - Impaired balance/gait;Impaired mobility - -  Follow up Falls evaluation completed;Education provided;Falls prevention discussed Falls evaluation completed - - -   Is the patient's home free of loose throw rugs in walkways, pet beds, electrical cords, etc?   yes      Grab bars in the bathroom? yes      Handrails on the stairs?   yes      Adequate lighting?   yes  Depression Screen PHQ 2/9 Scores 12/03/2019 08/02/2019 07/17/2019 06/04/2019  PHQ - 2 Score 0 0 0 0    Cognitive Function   6CIT Screen 12/03/2019  What Year? 0 points  What month? 0 points  What time? 0 points  Count back from 20 0 points  Months in reverse 0 points  Repeat phrase 0 points  Total Score 0    Immunization History  Administered Date(s) Administered  . Fluad Quad(high Dose 65+) 06/04/2019  .  Influenza Split 08/23/2011  . Influenza, High Dose Seasonal PF 08/03/2016, 09/05/2017, 06/20/2018  .  Influenza,inj,Quad PF,6+ Mos 08/07/2014  . PFIZER SARS-COV-2 Vaccination 10/21/2019, 11/13/2019  . Pneumococcal Polysaccharide-23 08/22/2008  . Td 08/31/2003    Qualifies for Shingles Vaccine? Discussed and patient will check with pharmacy for coverage.  Patient education handout provided   Screening Tests Health Maintenance  Topic Date Due  . PNA vac Low Risk Adult (2 of 2 - PCV13) 08/22/2009  . TETANUS/TDAP  08/30/2013  . INFLUENZA VACCINE  03/30/2020   Cancer Screenings: Lung: Low Dose CT Chest recommended if Age 27-80 years, 30 pack-year currently smoking OR have quit w/in 15years. Patient does not qualify. Colorectal: colonoscopy 01/26/13     Plan:  I have personally reviewed and addressed the Medicare Annual Wellness questionnaire and have noted the following in the patient's chart:  A. Medical and social history B. Use of alcohol, tobacco or illicit drugs  C. Current medications and supplements D. Functional ability and status E.  Nutritional status F.  Physical activity G. Advance directives H. List of other physicians I.  Hospitalizations, surgeries, and ER visits in previous 12 months J.  [place] such as hearing and vision if needed, cognitive and depression L. Referrals, records requested, and appointments- none   In addition, I have reviewed and discussed with patient certain preventive protocols, quality metrics, and best practice recommendations. A written personalized care plan for preventive services as well as general preventive health recommendations were provided to patient.   Signed,  Denman George, LPN  Nurse Health Advisor   Nurse Notes: no additional

## 2019-12-03 NOTE — Patient Instructions (Signed)
Jose [name] , Thank you for taking time to come for your Medicare Wellness Visit. I appreciate your ongoing commitment to your health goals. Please review the following plan we discussed and let me know if I can assist you in the future.   Screening recommendations/referrals: Colorectal Screening: up to date; last 01/26/13  Vision and Dental Exams: Recommended annual ophthalmology exams for early detection of glaucoma and other disorders of the eye Recommended annual dental exams for proper oral hygiene  Vaccinations: Influenza vaccine: completed 06/04/19 Pneumococcal vaccine: Prevnar recommended  Tdap vaccine: recommended; Please call your insurance company to determine your out of pocket expense. You may receive this vaccine at your local pharmacy or Health Dept. Shingles vaccine: You may receive this vaccine at your local pharmacy. (see handout)   Advanced directives: Please bring a copy of your POA (Power of Attorney) and/or Living Will to your next appointment.  Goals: Recommend to drink at least 6-8 8oz glasses of water per day and consume a balanced diet rich in fresh fruits and vegetables.   Next appointment: Please schedule your Annual Wellness Visit with your Nurse Health Advisor in one year.  Preventive Care 77 Years and Older, Male Preventive care refers to lifestyle choices and visits with your health care provider that can promote health and wellness. What does preventive care include?  A yearly physical exam. This is also called an annual well check.  Dental exams once or twice a year.  Routine eye exams. Ask your health care provider how often you should have your eyes checked.  Personal lifestyle choices, including:  Daily care of your teeth and gums.  Regular physical activity.  Eating a healthy diet.  Avoiding tobacco and drug use.  Limiting alcohol use.  Practicing safe sex.  Taking low doses of aspirin every day if recommended by your health care  provider..  Taking vitamin and mineral supplements as recommended by your health care provider. What happens during an annual well check? The services and screenings done by your health care provider during your annual well check will depend on your age, overall health, lifestyle risk factors, and family history of disease. Counseling  Your health care provider may ask you questions about your:  Alcohol use.  Tobacco use.  Drug use.  Emotional well-being.  Home and relationship well-being.  Sexual activity.  Eating habits.  History of falls.  Memory and ability to understand (cognition).  Work and work Statistician. Screening  You may have the following tests or measurements:  Height, weight, and BMI.  Blood pressure.  Lipid and cholesterol levels. These may be checked every 5 years, or more frequently if you are over 77 years old.  Skin check.  Lung cancer screening. You may have this screening every year starting at age 18 if you have a 30-pack-year history of smoking and currently smoke or have quit within the past 15 years.  Fecal occult blood test (FOBT) of the stool. You may have this test every year starting at age 24.  Flexible sigmoidoscopy or colonoscopy. You may have a sigmoidoscopy every 5 years or a colonoscopy every 10 years starting at age 77.  Prostate cancer screening. Recommendations will vary depending on your family history and other risks.  Hepatitis C blood test.  Hepatitis B blood test.  Sexually transmitted disease (STD) testing.  Diabetes screening. This is done by checking your blood sugar (glucose) after you have not eaten for a while (fasting). You may have this done every 1-3 years.  Abdominal aortic aneurysm (AAA) screening. You may need this if you are a current or former smoker.  Osteoporosis. You may be screened starting at age 77 if you are at high risk. Talk with your health care provider about your test results, treatment  options, and if necessary, the need for more tests. Vaccines  Your health care provider may recommend certain vaccines, such as:  Influenza vaccine. This is recommended every year.  Tetanus, diphtheria, and acellular pertussis (Tdap, Td) vaccine. You may need a Td booster every 10 years.  Zoster vaccine. You may need this after age 77.  Pneumococcal 13-valent conjugate (PCV13) vaccine. One dose is recommended after age 77.  Pneumococcal polysaccharide (PPSV23) vaccine. One dose is recommended after age 77. Talk to your health care provider about which screenings and vaccines you need and how often you need them. This information is not intended to replace advice given to you by your health care provider. Make sure you discuss any questions you have with your health care provider. Document Released: 09/12/2015 Document Revised: 05/05/2016 Document Reviewed: 06/17/2015 Elsevier Interactive Patient Education  2017 [place] Prevention in the Home Falls can cause injuries. They can happen to people of all ages. There are many things you can do to make your home safe and to help prevent falls. What can I do on the outside of my home?  Regularly fix the edges of walkways and driveways and fix any cracks.  Remove anything that might make you trip as you walk through a door, such as a raised step or threshold.  Trim any bushes or trees on the path to your home.  Use bright outdoor lighting.  Clear any walking paths of anything that might make someone trip, such as rocks or tools.  Regularly check to see if handrails are loose or broken. Make sure that both sides of any steps have handrails.  Any raised decks and porches should have guardrails on the edges.  Have any leaves, snow, or ice cleared regularly.  Use sand or salt on walking paths during winter.  Clean up any spills in your garage right away. This includes oil or grease spills. What can I do in the  bathroom?  Use night lights.  Install grab bars by the toilet and in the tub and shower. Do not use towel bars as grab bars.  Use non-skid mats or decals in the tub or shower.  If you need to sit down in the shower, use a plastic, non-slip stool.  Keep the floor dry. Clean up any water that spills on the floor as soon as it happens.  Remove soap buildup in the tub or shower regularly.  Attach bath mats securely with double-sided non-slip rug tape.  Do not have throw rugs and other things on the floor that can make you trip. What can I do in the bedroom?  Use night lights.  Make sure that you have a light by your bed that is easy to reach.  Do not use any sheets or blankets that are too big for your bed. They should not hang down onto the floor.  Have a firm chair that has side arms. You can use this for support while you get dressed.  Do not have throw rugs and other things on the floor that can make you trip. What can I do in the kitchen?  Clean up any spills right away.  Avoid walking on wet floors.  Keep items that you use  a lot in easy-to-reach places.  If you need to reach something above you, use a strong step stool that has a grab bar.  Keep electrical cords out of the way.  Do not use floor polish or wax that makes floors slippery. If you must use wax, use non-skid floor wax.  Do not have throw rugs and other things on the floor that can make you trip. What can I do with my stairs?  Do not leave any items on the stairs.  Make sure that there are handrails on both sides of the stairs and use them. Fix handrails that are broken or loose. Make sure that handrails are as long as the stairways.  Check any carpeting to make sure that it is firmly attached to the stairs. Fix any carpet that is loose or worn.  Avoid having throw rugs at the top or bottom of the stairs. If you do have throw rugs, attach them to the floor with carpet tape.  Make sure that you have a  light switch at the top of the stairs and the bottom of the stairs. If you do not have them, ask someone to add them for you. What else can I do to help prevent falls?  Wear shoes that:  Do not have high heels.  Have rubber bottoms.  Are comfortable and fit you well.  Are closed at the toe. Do not wear sandals.  If you use a stepladder:  Make sure that it is fully opened. Do not climb a closed stepladder.  Make sure that both sides of the stepladder are locked into place.  Ask someone to hold it for you, if possible.  Clearly mark and make sure that you can see:  Any grab bars or handrails.  First and last steps.  Where the edge of each step is.  Use tools that help you move around (mobility aids) if they are needed. These include:  Canes.  Walkers.  Scooters.  Crutches.  Turn on the lights when you go into a dark area. Replace any light bulbs as soon as they burn out.  Set up your furniture so you have a clear path. Avoid moving your furniture around.  If any of your floors are uneven, fix them.  If there are any pets around you, be aware of where they are.  Review your medicines with your doctor. Some medicines can make you feel dizzy. This can increase your chance of falling. Ask your doctor what other things that you can do to help prevent falls. This information is not intended to replace advice given to you by your health care provider. Make sure you discuss any questions you have with your health care provider. Document Released: 06/12/2009 Document Revised: 01/22/2016 Document Reviewed: 09/20/2014 Elsevier Interactive Patient Education  2017 [employer].

## 2019-12-03 NOTE — Progress Notes (Signed)
Phone [phone] In person visit   Subjective:   Jose [name] is a 79 y.o. year old very pleasant male patient who presents for/with See problem oriented charting Chief Complaint  Patient presents with  . Follow-up    No concerns at this time.    This visit occurred during the SARS-CoV-2 public health emergency.  Safety protocols were in place, including screening questions prior to the visit, additional usage of staff PPE, and extensive cleaning of exam room while observing appropriate contact time as indicated for disinfecting solutions.   Past Medical History-  Patient Active Problem List   Diagnosis Date Noted  . History of COVID-19.  Hospitalization November 2020 with acute respiratory failure. 07/09/2019    Priority: High  . Aortic aneurysm ([place]) 06/04/2019    Priority: High  . Atherosclerosis of coronary artery 02/09/2007    Priority: High  . Barrett's esophagus 02/09/2007    Priority: High  . Fatty liver 06/04/2019    Priority: Medium  . GERD (gastroesophageal reflux disease) 10/23/2018    Priority: Medium  . Primary osteoarthritis of left knee 02/27/2018    Priority: Medium  . Elevated PSA 08/03/2016    Priority: Medium  . Benign prostatic hypertrophy (BPH) with nocturia 05/27/2016    Priority: Medium  . Hyperglycemia 02/26/2013    Priority: Medium  . Hyperlipidemia 02/09/2007    Priority: Medium  . Aortic atherosclerosis ([place]) 06/04/2019    Priority: Low  . Acute prostatitis 02/21/2019    Priority: Low  . Nasal congestion 10/23/2018    Priority: Low  . Lumbosacral radiculopathy 06/17/2015    Priority: Low  . Numbness 06/17/2015    Priority: Low  . Right leg numbness     Priority: Low  . Sinus bradycardia 03/19/2015    Priority: Low  . Diverticulosis of colon with hemorrhage     Priority: Low  . Actinic keratosis 08/07/2014    Priority: Low  . Elevated blood pressure 03/29/2014    Priority: Low  . OBESITY 03/20/2010    Priority: Low  .  Generalized anxiety disorder 08/22/2008    Priority: Low  . ERECTILE DYSFUNCTION 08/22/2008    Priority: Low  . Diverticulosis of large intestine 02/09/2007    Priority: Low  . Gastrointestinal hemorrhage 10/15/2014    Medications- reviewed and updated Current Outpatient Medications  Medication Sig Dispense Refill  . aspirin EC 81 MG tablet Take 1 tablet (81 mg total) by mouth 2 (two) times daily. For DVT prophylaxis (Patient taking differently: Take 81 mg by mouth daily. For DVT prophylaxis) 60 tablet 0  . omeprazole (PRILOSEC) 40 MG capsule Take 1 capsule (40 mg total) by mouth daily. 90 capsule 3  . rosuvastatin (CRESTOR) 20 MG tablet TAKE 1 TABLET DAILY (Patient taking differently: Take 20 mg by mouth daily. ) 90 tablet 3  . tadalafil (ADCIRCA/CIALIS) 20 MG tablet TAKE ONE-HALF (1/2) TABLET DAILY AS NEEDED FOR ERECTILE DYSFUNCTION (Patient taking differently: Take 10 mg by mouth daily as needed for erectile dysfunction. ) 8 tablet 18  . traMADol (ULTRAM) 50 MG tablet Take 1-2 tablets (50-100 mg total) by mouth 2 (two) times daily as needed for severe pain. 60 tablet 5   No current facility-administered medications for this visit.     Objective:  BP 122/80   Pulse (!) 58   Temp 98.3 F (36.8 C) (Temporal)   Ht 5\' 6"  (1.676 m)   Wt 222 lb (100.7 kg)   SpO2 95%   BMI 35.83 kg/m  Gen: NAD, resting comfortably CV: RRR no murmurs rubs or gallops Lungs: CTAB no crackles, wheeze, rhonchi    Assessment and Plan   # Hyperglycemia/insulin resistance/prediabetes S: Diet controlled in the past- weight has been trending up since July though. Enjoys eating and overdoes sweets Lab Results  Component Value Date   HGBA1C 5.8 02/19/2019   HGBA1C 5.6 06/20/2018   HGBA1C 5.9 12/19/2017   A/P: hopefully controlled- cutting back on sweets and exercising could both help fight off diabetes developing- encouraged both.   #Hyperlipidemia  #CAD-Followed in [place] s: Lipids well  controlled on rosuvastatin 20 mg.  Ideal LDL under 70  No reported chest pain.  Patient compliant with aspirin Lab Results  Component Value Date   CHOL 107 06/20/2018   HDL 38.70 (L) 06/20/2018   LDLCALC 41 06/20/2018   LDLDIRECT 100.0 12/19/2017   TRIG 52 07/09/2019   CHOLHDL 3 06/20/2018   A/P: Has been at goal-continue current medication and update lipid panel  - if ldl still as low as last time could consider 10 mg dose- this may slightly reduce diabetes risk- he reports had been on 10mg  in the past.  -CAD appears asymptomatic-continue aspirin and statin   #Left knee pain despite replacement/osteoarthritis/chronic pain S: History of left knee replacement-with continued pain in the left knee despite this patient using 1 tramadol in the morning and 1 in the evening for his knees- skips at times.  We received a letter from [facility] in 2020 requesting that we begin his long-term tramadol prescription. A/P: left knee pain reasonably controlled with tramadol- we will continue current rx   #GERD/Barrett's esophagus S: doing well on omeprazole 40mg  per Dr. Fuller Plan. Only issue he gets is some phlegm clearing- discussed could also be allergy related.  Lab Results  Component Value Date   [id number] 02/19/2019  A/P: still gets some breakthrough occasionally on current dose- continue current meds  # Fatty liver S:  Noted on CT 04/20/2019.  Unfortunately patient's weight is trending up-up 12 pound since November A/P: We discussed the importance of weight loss in context of fatty liver-encouraged healthy eating/regular exercise  # Aortic aneurysm S: 3 cm aneurysm noted on ct 04/20/2019- 3 year repeat US planned.  No abdominal pain reported A/P: Suspect stable-repeat ultrasound August 2023 or later  #Elevated PSA-following up with urology yearly at least. Was around 5.5 in august and apparently came down November 2020 around 4 - planned yearly follow up  Recommended follow up: Return in  about 6 months (around 06/03/2020) for follow up- or sooner if needed.  Lab/Order associations:   ICD-10-CM   1. Hyperlipidemia, unspecified hyperlipidemia type  E78.5 CBC with Differential/Platelet    Comprehensive metabolic panel    Lipid panel  2. Atherosclerosis of native coronary artery of native heart without angina pectoris  I25.10   3. Hyperglycemia  R73.9 Hemoglobin A1c  4. Primary osteoarthritis of left knee  M17.12   5. Gastroesophageal reflux disease, unspecified whether esophagitis present  K21.9   6. Barrett's esophagus without dysplasia  K22.70   7. Fatty liver  K76.0   8. Abdominal aortic aneurysm (AAA) without rupture (HCC) Chronic I71.4     Meds ordered this encounter  Medications  . traMADol (ULTRAM) 50 MG tablet    Sig: Take 1-2 tablets (50-100 mg total) by mouth 2 (two) times daily as needed for severe pain.    Dispense:  60 tablet    Refill:  5   Return precautions advised.  [name], MD

## 2019-12-04 LAB — COMPREHENSIVE METABOLIC PANEL
ALT: 16 U/L (ref 0–53)
AST: 18 U/L (ref 0–37)
Albumin: 4.1 g/dL (ref 3.5–5.2)
Alkaline Phosphatase: 93 U/L (ref 39–117)
BUN: 11 mg/dL (ref 6–23)
CO2: 27 mEq/L (ref 19–32)
Calcium: 9.4 mg/dL (ref 8.4–10.5)
Chloride: 107 mEq/L (ref 96–112)
Creatinine, Ser: 1.06 mg/dL (ref 0.40–1.50)
GFR: 67.87 mL/min (ref >60.00)
Glucose, Bld: 97 mg/dL (ref 70–99)
Potassium: 4.3 mEq/L (ref 3.5–5.1)
Sodium: 140 mEq/L (ref 135–145)
Total Bilirubin: 0.7 mg/dL (ref 0.2–1.2)
Total Protein: 6.5 g/dL (ref 6.0–8.3)

## 2019-12-04 LAB — LIPID PANEL
Cholesterol: 113 mg/dL (ref 0–200)
HDL: 39.4 mg/dL (ref >39.00)
LDL Cholesterol: 58 mg/dL (ref 0–99)
NonHDL: 73.67
Total CHOL/HDL Ratio: 3
Triglycerides: 79 mg/dL (ref 0.0–149.0)
VLDL: 15.8 mg/dL (ref 0.0–40.0)

## 2019-12-04 LAB — CBC WITH DIFFERENTIAL/PLATELET
Basophils Absolute: 0 10*3/uL (ref 0.0–0.1)
Basophils Relative: 0.5 % (ref 0.0–3.0)
Eosinophils Absolute: 0.1 10*3/uL (ref 0.0–0.7)
Eosinophils Relative: 1.8 % (ref 0.0–5.0)
HCT: 44.4 % (ref 39.0–52.0)
Hemoglobin: 15.2 g/dL (ref 13.0–17.0)
Lymphocytes Relative: 26.5 % (ref 12.0–46.0)
Lymphs Abs: 1.6 10*3/uL (ref 0.7–4.0)
MCHC: 34.2 g/dL (ref 30.0–36.0)
MCV: 85.9 fl (ref 78.0–100.0)
Monocytes Absolute: 0.8 10*3/uL (ref 0.1–1.0)
Monocytes Relative: 13.9 % — ABNORMAL HIGH (ref 3.0–12.0)
Neutro Abs: 3.5 10*3/uL (ref 1.4–7.7)
Neutrophils Relative %: 57.3 % (ref 43.0–77.0)
Platelets: 180 10*3/uL (ref 150.0–400.0)
RBC: 5.17 Mil/uL (ref 4.22–5.81)
RDW: 14 % (ref 11.5–15.5)
WBC: 6.1 10*3/uL (ref 4.0–10.5)

## 2019-12-04 LAB — HEMOGLOBIN A1C: Hgb A1c MFr Bld: 5.7 % (ref 4.6–6.5)

## 2020-01-06 ENCOUNTER — Other Ambulatory Visit: Payer: Self-pay | Admitting: Family Medicine

## 2020-01-07 NOTE — Telephone Encounter (Signed)
Last refill: 01/03/19 #8, 18 Last OV: 12/03/19 dx. 6 month f/u

## 2020-01-11 ENCOUNTER — Telehealth: Payer: Self-pay | Admitting: Family Medicine

## 2020-01-11 NOTE — Telephone Encounter (Signed)
Patient calling, stating that he has mail order on his Cialis needs prior Auth. He gave me this number directly to mail order. [phone]. Please Advise jk

## 2020-01-15 NOTE — Telephone Encounter (Signed)
PA has been started

## 2020-01-18 ENCOUNTER — Encounter: Payer: Self-pay | Admitting: Physician Assistant

## 2020-01-18 ENCOUNTER — Other Ambulatory Visit: Payer: Self-pay

## 2020-01-18 ENCOUNTER — Telehealth (INDEPENDENT_AMBULATORY_CARE_PROVIDER_SITE_OTHER): Payer: Medicare Other | Admitting: Physician Assistant

## 2020-01-18 VITALS — Temp 97.2°F | Ht 66.0 in | Wt 215.0 lb

## 2020-01-18 DIAGNOSIS — R05 Cough: Secondary | ICD-10-CM

## 2020-01-18 DIAGNOSIS — R059 Cough, unspecified: Secondary | ICD-10-CM

## 2020-01-18 MED ORDER — PREDNISONE 20 MG PO TABS: 40.0000 mg | ORAL_TABLET | Freq: Every day | ORAL | 0 refills | Status: DC

## 2020-01-18 MED ORDER — AZITHROMYCIN 250 MG PO TABS
ORAL_TABLET | ORAL | 0 refills | Status: DC
Start: 2020-01-18 — End: 2020-01-25

## 2020-01-18 NOTE — Progress Notes (Deleted)
Virtual Visit via Video   I connected with Jose [name] on 01/18/20 at 12:00 PM EDT by a video enabled telemedicine application and verified that I am speaking with the correct person using two identifiers. Location patient: Home Location provider: [facility], Office Persons participating in the virtual visit: [place], Son[place]ger PA-C, Anselmo Pickler, LPN   I discussed the limitations of evaluation and management by telemedicine and the availability of in person appointments. The patient expressed understanding and agreed to proceed.  I acted as a Education administrator for [employer], PA-C [employer], LPN   Subjective:   HPI:  Cough Pt c/o cough and expectorating thick white sputum, chest congestion started on Monday. Pt has used Zyrtec and Tylenol. Denies fever or chills.   ROS: See pertinent positives and negatives per HPI.  Patient Active Problem List   Diagnosis Date Noted  . History of COVID-19.  Hospitalization November 2020 with acute respiratory failure. 07/09/2019  . Aortic aneurysm ([place]) 06/04/2019  . Fatty liver 06/04/2019  . Aortic atherosclerosis ([place]) 06/04/2019  . Acute prostatitis 02/21/2019  . GERD (gastroesophageal reflux disease) 10/23/2018  . Nasal congestion 10/23/2018  . Primary osteoarthritis of left knee 02/27/2018  . Elevated PSA 08/03/2016  . Benign prostatic hypertrophy (BPH) with nocturia 05/27/2016  . Lumbosacral radiculopathy 06/17/2015  . Numbness 06/17/2015  . Right leg numbness   . Sinus bradycardia 03/19/2015  . Diverticulosis of colon with hemorrhage   . Gastrointestinal hemorrhage 10/15/2014  . Actinic keratosis 08/07/2014  . Elevated blood pressure 03/29/2014  . Hyperglycemia 02/26/2013  . OBESITY 03/20/2010  . Generalized anxiety disorder 08/22/2008  . ERECTILE DYSFUNCTION 08/22/2008  . Hyperlipidemia 02/09/2007  . Atherosclerosis of coronary artery 02/09/2007  . Barrett's esophagus 02/09/2007  . Diverticulosis  of large intestine 02/09/2007    Social History   Tobacco Use  . Smoking status: Former Smoker    Packs/day: 1.00    Years: 30.00    Pack years: 30.00    Types: Cigarettes  . Smokeless tobacco: Never Used  . Tobacco comment: "quit smoking cigarettes in ~ 1990"  Substance Use Topics  . Alcohol use: Yes    Comment: 04/14/2015 "might drink a couple beers/month"    Current Outpatient Medications:  .  aspirin EC 81 MG tablet, Take 1 tablet (81 mg total) by mouth 2 (two) times daily. For DVT prophylaxis (Patient taking differently: Take 81 mg by mouth daily. For DVT prophylaxis), Disp: 60 tablet, Rfl: 0 .  omeprazole (PRILOSEC) 40 MG capsule, Take 1 capsule (40 mg total) by mouth daily., Disp: 90 capsule, Rfl: 3 .  rosuvastatin (CRESTOR) 20 MG tablet, TAKE 1 TABLET DAILY, Disp: 90 tablet, Rfl: 3 .  tadalafil (CIALIS) 20 MG tablet, TAKE ONE-HALF (1/2) TABLET DAILY AS NEEDED FOR ERECTILE DYSFUNCTION, Disp: 8 tablet, Rfl: 17 .  traMADol (ULTRAM) 50 MG tablet, Take 1-2 tablets (50-100 mg total) by mouth 2 (two) times daily as needed for severe pain., Disp: 60 tablet, Rfl: 5  Allergies  Allergen Reactions  . Doxycycline     Vomiting but may have been due to COVID-19  . Lipitor [Atorvastatin]     REACTION: muscle weakness    Objective:   VITALS: Per patient if applicable, see vitals. GENERAL: Alert, appears well and in no acute distress. HEENT: Atraumatic, conjunctiva clear, no obvious abnormalities on inspection of external nose and ears. NECK: Normal movements of the head and neck. CARDIOPULMONARY: No increased WOB. Speaking in clear sentences. I:E ratio WNL.  MS: Moves all visible extremities without noticeable abnormality. PSYCH: Pleasant and cooperative, well-groomed. Speech normal rate and rhythm. Affect is appropriate. Insight and judgement are appropriate. Attention is focused, linear, and appropriate.  NEURO: CN grossly intact. Oriented as arrived to appointment on time with no  prompting. Moves both UE equally.  SKIN: No obvious lesions, wounds, erythema, or cyanosis noted on face or hands.  Assessment and Plan:   There are no diagnoses linked to this encounter.  . Reviewed expectations re: course of current medical issues. . Discussed self-management of symptoms. . Outlined signs and symptoms indicating need for more acute intervention. . Patient verbalized understanding and all questions were answered. Marland Kitchen Health Maintenance issues including appropriate healthy diet, exercise, and smoking avoidance were discussed with patient. . See orders for this visit as documented in the electronic medical record.  I discussed the assessment and treatment plan with the patient. The patient was provided an opportunity to ask questions and all were answered. The patient agreed with the plan and demonstrated an understanding of the instructions.   The patient was advised to call back or seek an in-person evaluation if the symptoms worsen or if the condition fails to improve as anticipated.   ***  Anselmo Pickler, LPN [phone]

## 2020-01-18 NOTE — Progress Notes (Signed)
TELEPHONE ENCOUNTER   Patient verbally agreed to telephone visit and is aware that copayment and coinsurance may apply. Patient was treated using telemedicine according to accepted telemedicine protocols.  Location of the patient: home Location of provider: [facility] office Names of all persons participating in the telemedicine service and role in the encounter: Inda Coke, PA , Grover Canavan  Subjective:   Chief Complaint  Patient presents with  . Cough     HPI   Cough Pt c/o cough and expectorating thick white sputum, chest congestion started on Monday. Has not improved with time. Pt has used Zyrtec and Tylenol. Denies fever, chills, chest pain, SOB.  History of COVID-19 in November, and is fully vaccinated.  Has a pulse ox and oxygen has been hovering around 93%.  Patient Active Problem List   Diagnosis Date Noted  . History of COVID-19.  Hospitalization November 2020 with acute respiratory failure. 07/09/2019  . Aortic aneurysm ([place]) 06/04/2019  . Fatty liver 06/04/2019  . Aortic atherosclerosis ([place]) 06/04/2019  . Acute prostatitis 02/21/2019  . GERD (gastroesophageal reflux disease) 10/23/2018  . Nasal congestion 10/23/2018  . Primary osteoarthritis of left knee 02/27/2018  . Elevated PSA 08/03/2016  . Benign prostatic hypertrophy (BPH) with nocturia 05/27/2016  . Lumbosacral radiculopathy 06/17/2015  . Numbness 06/17/2015  . Right leg numbness   . Sinus bradycardia 03/19/2015  . Diverticulosis of colon with hemorrhage   . Gastrointestinal hemorrhage 10/15/2014  . Actinic keratosis 08/07/2014  . Elevated blood pressure 03/29/2014  . Hyperglycemia 02/26/2013  . OBESITY 03/20/2010  . Generalized anxiety disorder 08/22/2008  . ERECTILE DYSFUNCTION 08/22/2008  . Hyperlipidemia 02/09/2007  . Atherosclerosis of coronary artery 02/09/2007  . Barrett's esophagus 02/09/2007  . Diverticulosis of large intestine 02/09/2007   Social History   Tobacco Use    . Smoking status: Former Smoker    Packs/day: 1.00    Years: 30.00    Pack years: 30.00    Types: Cigarettes  . Smokeless tobacco: Never Used  . Tobacco comment: "quit smoking cigarettes in ~ 1990"  Substance Use Topics  . Alcohol use: Yes    Comment: 04/14/2015 "might drink a couple beers/month"    Current Outpatient Medications:  .  aspirin EC 81 MG tablet, Take 1 tablet (81 mg total) by mouth 2 (two) times daily. For DVT prophylaxis (Patient taking differently: Take 81 mg by mouth daily. For DVT prophylaxis), Disp: 60 tablet, Rfl: 0 .  omeprazole (PRILOSEC) 40 MG capsule, Take 1 capsule (40 mg total) by mouth daily., Disp: 90 capsule, Rfl: 3 .  rosuvastatin (CRESTOR) 20 MG tablet, TAKE 1 TABLET DAILY, Disp: 90 tablet, Rfl: 3 .  tadalafil (CIALIS) 20 MG tablet, TAKE ONE-HALF (1/2) TABLET DAILY AS NEEDED FOR ERECTILE DYSFUNCTION, Disp: 8 tablet, Rfl: 17 .  traMADol (ULTRAM) 50 MG tablet, Take 1-2 tablets (50-100 mg total) by mouth 2 (two) times daily as needed for severe pain., Disp: 60 tablet, Rfl: 5 .  azithromycin (ZITHROMAX) 250 MG tablet, Take two tablets on day 1, then one daily x 4 days, Disp: 6 tablet, Rfl: 0 .  predniSONE (DELTASONE) 20 MG tablet, Take 2 tablets (40 mg total) by mouth daily., Disp: 10 tablet, Rfl: 0 Allergies  Allergen Reactions  . Doxycycline     Vomiting but may have been due to COVID-19  . Lipitor [Atorvastatin]     REACTION: muscle weakness    Assessment & Plan:   1. Cough   No red flags on discussion.  Will initiate oral azithromycin and oral prednisone per orders to cover for URI, possible bronchitis. Discussed taking medications as prescribed. Reviewed return precautions including fever, SOB, worsening cough or other concerns. Push fluids and rest. I recommend that patient follow-up if symptoms worsen or persist despite treatment x 7-10 days, sooner if needed.  I also instructed patient to monitor his oxygen and if it drops below 90% regularly, to  seek medical attention.  No orders of the defined types were placed in this encounter.  Meds ordered this encounter  Medications  . predniSONE (DELTASONE) 20 MG tablet    Sig: Take 2 tablets (40 mg total) by mouth daily.    Dispense:  10 tablet    Refill:  0    Order Specific Question:   Supervising Provider    Answer:   Vivi Barrack [id number]  . azithromycin (ZITHROMAX) 250 MG tablet    Sig: Take two tablets on day 1, then one daily x 4 days    Dispense:  6 tablet    Refill:  0    Order Specific Question:   Supervising Provider    Answer:   Maryruth Eve    Inda Coke, PA 01/18/2020  Time spent with the patient: 8 minutes, spent in obtaining information about his symptoms, reviewing his previous labs, evaluations, and treatments, counseling him about his condition (please see the discussed topics above), and developing a plan to further investigate it; he had a number of questions which I addressed.

## 2020-01-25 ENCOUNTER — Ambulatory Visit
Admission: EM | Admit: 2020-01-25 | Discharge: 2020-01-25 | Disposition: A | Discharge status: Home / Self Care | Payer: Medicare Other | Attending: Physician Assistant | Admitting: Physician Assistant

## 2020-01-25 ENCOUNTER — Encounter: Payer: Self-pay | Admitting: Emergency Medicine

## 2020-01-25 ENCOUNTER — Other Ambulatory Visit: Payer: Self-pay

## 2020-01-25 DIAGNOSIS — R059 Cough, unspecified: Secondary | ICD-10-CM

## 2020-01-25 DIAGNOSIS — J0140 Acute pansinusitis, unspecified: Secondary | ICD-10-CM

## 2020-01-25 MED ORDER — FLUTICASONE PROPIONATE 50 MCG/ACT NA SUSP
2.0000 | Freq: Every day | NASAL | 0 refills | Status: DC
Start: 2020-01-25 — End: 2022-02-12

## 2020-01-25 MED ORDER — AMOXICILLIN-POT CLAVULANATE 875-125 MG PO TABS
1.0000 | ORAL_TABLET | Freq: Two times a day (BID) | ORAL | 0 refills | Status: DC
Start: 2020-01-25 — End: 2020-06-03

## 2020-01-25 MED ORDER — IPRATROPIUM BROMIDE 0.06 % NA SOLN
2.0000 | Freq: Four times a day (QID) | NASAL | 0 refills | Status: DC
Start: 2020-01-25 — End: 2022-02-12

## 2020-01-25 NOTE — Discharge Instructions (Signed)
Augmentin for possible bacterial sinus infection. Start flonase, atrovent nasal spray for nasal congestion/drainage. You can use over the counter nasal saline rinse such as neti pot for nasal congestion. Keep hydrated, your urine should be clear to pale yellow in color. Tylenol/motrin for fever and pain. Monitor for any worsening of symptoms, chest pain, shortness of breath, wheezing, swelling of the throat, go to the emergency department for further evaluation needed.

## 2020-01-25 NOTE — ED Triage Notes (Signed)
Pt here for nasal congestion and cough x 1 week; pt sts took zithromax and prednisone without relief

## 2020-01-25 NOTE — ED Provider Notes (Signed)
[name]    CSN: [id number] Arrival date & time: 01/25/20  1713      History   Chief Complaint Chief Complaint  Patient presents with  . Cough  . Nasal Congestion    HPI Jose [name] is a 79 y.o. male.   79 year old male comes in for 1 week history of URI symptoms. Sinus pressure, nasal congestion, cough, body aches. Denies fever, chills, body aches. Denies abdominal pain, nausea, vomiting, diarrhea. Denies shortness of breath, loss of taste/smell. Denies chest pain. Former smoker. States did virtual visit with PCP when symptoms first started, and was started on azithromycin and prednisone without relief.      Past Medical History:  Diagnosis Date  . Acute lower GI bleeding 01/25/2013; 09/2014   diverticular bleed  . Acute prostatitis 02/21/2019  . ANXIETY 08/22/2008  . Arthritis    "a little bit" (04/14/2015)  . Barrett's esophagus 2003   hematochezia as a result since 2003  . Basal cell cancer    removed from right forearm.   . Bradycardia 04/14/2015  . CORONARY ARTERY DISEASE    2 stents  . Depression   . DIVERTICULOSIS, COLON 2003  . ERECTILE DYSFUNCTION 08/22/2008  . GERD (gastroesophageal reflux disease)   . Heart murmur 04/14/2015   "they think I did; that's why I'm here" (04/14/2015)  . History of kidney stones   . HYPERLIPIDEMIA 02/09/2007  . Kidney stones    "blasted or passed" (04/14/2015)  . MI (myocardial infarction) ([place]) 2000  . Numbness    right side - history of   . OBESITY 03/20/2010  . Squamous cell cancer of skin of earlobe    removed from area behind right earlobe  . Stroke [facility])    4 yrs ago possible mini stroke -  tests never found anything    Patient Active Problem List   Diagnosis Date Noted  . History of COVID-19.  Hospitalization November 2020 with acute respiratory failure. 07/09/2019  . Aortic aneurysm ([place]) 06/04/2019  . Fatty liver 06/04/2019  . Aortic atherosclerosis ([place]) 06/04/2019  . Acute prostatitis  02/21/2019  . GERD (gastroesophageal reflux disease) 10/23/2018  . Nasal congestion 10/23/2018  . Primary osteoarthritis of left knee 02/27/2018  . Elevated PSA 08/03/2016  . Benign prostatic hypertrophy (BPH) with nocturia 05/27/2016  . Lumbosacral radiculopathy 06/17/2015  . Numbness 06/17/2015  . Right leg numbness   . Sinus bradycardia 03/19/2015  . Diverticulosis of colon with hemorrhage   . Gastrointestinal hemorrhage 10/15/2014  . Actinic keratosis 08/07/2014  . Elevated blood pressure 03/29/2014  . Hyperglycemia 02/26/2013  . OBESITY 03/20/2010  . Generalized anxiety disorder 08/22/2008  . ERECTILE DYSFUNCTION 08/22/2008  . Hyperlipidemia 02/09/2007  . Atherosclerosis of coronary artery 02/09/2007  . Barrett's esophagus 02/09/2007  . Diverticulosis of large intestine 02/09/2007    Past Surgical History:  Procedure Laterality Date  . BASAL CELL CARCINOMA EXCISION     posterior right ear.   Marland Kitchen CATARACT EXTRACTION, BILATERAL    . COLONOSCOPY N/A 01/26/2013   Procedure: COLONOSCOPY;  Surgeon: Milus Banister, MD;  Location: [facility];  Service: Endoscopy;  Laterality: N/A;  . CORONARY ANGIOPLASTY WITH STENT PLACEMENT  09/06/1998   LAD stent per Dr Elonda Husky in [place]. 2 stents.   . CYSTOSCOPY W/ URETERAL STENT PLACEMENT Left 12/21/2016   Procedure: CYSTOSCOPY WITH RETROGRADE PYELOGRAM/URETERAL STENT PLACEMENT;  Surgeon: Alexis Frock, MD;  Location: [facility];  Service: Urology;  Laterality: Left;  . ESOPHAGOGASTRODUODENOSCOPY (EGD) WITH  ESOPHAGEAL DILATION     "q time I have an EGD; usually q 3-4 years" (04/14/2015)  . EXTRACORPOREAL SHOCK WAVE LITHOTRIPSY Left 12/30/2016   Procedure: LEFT EXTRACORPOREAL SHOCK WAVE LITHOTRIPSY (ESWL);  Surgeon: Carolan Clines, MD;  Location[facility];  Service: Urology;  Laterality: Left;  . LITHOTRIPSY  X 2  . SQUAMOUS CELL CARCINOMA EXCISION     right forearm  . TONSILLECTOMY  1971  . TOTAL KNEE ARTHROPLASTY Left 03/21/2018  . TOTAL KNEE  ARTHROPLASTY Left 03/21/2018   Procedure: LEFT TOTAL KNEE ARTHROPLASTY;  Surgeon: Renette Butters, MD;  Location: [facility];  Service: Orthopedics;  Laterality: Left;  . UPPER GASTROINTESTINAL ENDOSCOPY    . URETERAL STENT PLACEMENT     2019       Home Medications    Prior to Admission medications   Medication Sig Start Date End Date Taking? Authorizing Provider  amoxicillin-clavulanate (AUGMENTIN) 875-125 MG tablet Take 1 tablet by mouth every 12 (twelve) hours. 01/25/20   Ok Edwards, PA-C  aspirin EC 81 MG tablet Take 1 tablet (81 mg total) by mouth 2 (two) times daily. For DVT prophylaxis Patient taking differently: Take 81 mg by mouth daily. For DVT prophylaxis 03/21/18   Prudencio Burly III, PA-C  fluticasone (FLONASE) 50 MCG/ACT nasal spray Place 2 sprays into both nostrils daily. 01/25/20   Tasia Catchings, Kha Hari V, PA-C  ipratropium (ATROVENT) 0.06 % nasal spray Place 2 sprays into both nostrils 4 (four) times daily. 01/25/20   Tasia Catchings, Lis Savitt V, PA-C  omeprazole (PRILOSEC) 40 MG capsule Take 1 capsule (40 mg total) by mouth daily. 04/04/19   Ladene Artist, MD  rosuvastatin (CRESTOR) 20 MG tablet TAKE 1 TABLET DAILY 01/07/20   Marin Olp, MD  tadalafil (CIALIS) 20 MG tablet TAKE ONE-HALF (1/2) TABLET DAILY AS NEEDED FOR ERECTILE DYSFUNCTION 01/07/20   Marin Olp, MD  traMADol (ULTRAM) 50 MG tablet Take 1-2 tablets (50-100 mg total) by mouth 2 (two) times daily as needed for severe pain. 12/03/19   Marin Olp, MD    Family History Family History  Problem Relation Age of Onset  . Alcohol abuse Father   . Liver disease Father   . Heart disease Brother        CABG  . Colon cancer Neg Hx     Social History Social History   Tobacco Use  . Smoking status: Former Smoker    Packs/day: 1.00    Years: 30.00    Pack years: 30.00    Types: Cigarettes  . Smokeless tobacco: Never Used  . Tobacco comment: "quit smoking cigarettes in ~ 1990"  Substance Use Topics  . Alcohol use: Yes      Comment: 04/14/2015 "might drink a couple beers/month"  . Drug use: No     Allergies   Doxycycline and Lipitor [atorvastatin]   Review of Systems Review of Systems  Reason unable to perform ROS: See HPI as above.     Physical Exam Triage Vital Signs ED Triage Vitals [01/25/20 1723]  Enc Vitals Group     BP (!) 141/86     Pulse Rate 84     Resp 18     Temp 97.9 F (36.6 C)     Temp Source Oral     SpO2 94 %     Weight      Height      Head Circumference      Peak Flow      Pain Score  5     Pain Loc      Pain Edu?      Excl. in [place]?    No data found.  Updated Vital Signs BP (!) 141/86 (BP Location: Left Arm)   Pulse 84   Temp 97.9 F (36.6 C) (Oral)   Resp 18   SpO2 94%   Physical Exam Constitutional:      General: He is not in acute distress.    Appearance: He is well-developed. He is not ill-appearing, toxic-appearing or diaphoretic.  HENT:     Head: Normocephalic and atraumatic.     Right Ear: Tympanic membrane, ear canal and external ear normal. Tympanic membrane is not erythematous or bulging.     Left Ear: Tympanic membrane, ear canal and external ear normal. Tympanic membrane is not erythematous or bulging.     Nose: Congestion and rhinorrhea present.     Right Sinus: Maxillary sinus tenderness and frontal sinus tenderness present.     Left Sinus: Maxillary sinus tenderness and frontal sinus tenderness present.     Mouth/Throat:     Mouth: Mucous membranes are moist.     Pharynx: Oropharynx is clear. Uvula midline.  Eyes:     Conjunctiva/sclera: Conjunctivae normal.     Pupils: Pupils are equal, round, and reactive to light.  Cardiovascular:     Rate and Rhythm: Normal rate and regular rhythm.  Pulmonary:     Effort: Pulmonary effort is normal. No accessory muscle usage, prolonged expiration, respiratory distress or retractions.     Breath sounds: No decreased air movement or transmitted upper airway sounds. No decreased breath sounds.      Comments: LCTAB Musculoskeletal:     Cervical back: Normal range of motion and neck supple.  Skin:    General: Skin is warm and dry.  Neurological:     Mental Status: He is alert and oriented to person, place, and time.      UC Treatments / Results  Labs (all labs ordered are listed, but only abnormal results are displayed) Labs Reviewed - No data to display  EKG   Radiology No results found.  Procedures Procedures (including critical care time)  Medications Ordered in UC Medications - No data to display  Initial Impression / Assessment and Plan / UC Course  I have reviewed the triage vital signs and the nursing notes.  Pertinent labs & imaging results that were available during my care of the patient were reviewed by me and considered in my medical decision making (see chart for details).    Patient with significant sinus drainage, will cover for bacterial sinusitis with augmentin. Discussed possibility of viral illness vs allergic rhinitis as well. Will start other symptomatic management. Return precautions given. Patient expresses understanding and agrees to plan.  Final Clinical Impressions(s) / UC Diagnoses   Final diagnoses:  Acute non-recurrent pansinusitis  Cough   ED Prescriptions    Medication Sig Dispense Auth. Provider   amoxicillin-clavulanate (AUGMENTIN) 875-125 MG tablet Take 1 tablet by mouth every 12 (twelve) hours. 14 tablet Brinnley Lacap V, PA-C   ipratropium (ATROVENT) 0.06 % nasal spray Place 2 sprays into both nostrils 4 (four) times daily. 15 mL Story Vanvranken V, PA-C   fluticasone (FLONASE) 50 MCG/ACT nasal spray Place 2 sprays into both nostrils daily. 1 g Ok Edwards, PA-C     PDMP not reviewed this encounter.   Ok Edwards, PA-C 01/25/20 1922

## 2020-02-02 IMAGING — DX DG KNEE 1-2V PORT*L*
2 series · 2 of 2 positions shown · non-contrast
Comparison: None in PACs

CLINICAL DATA: Status post left total knee joint replacement.

EXAM:
PORTABLE LEFT KNEE - 1-2 VIEW

[knee ap]
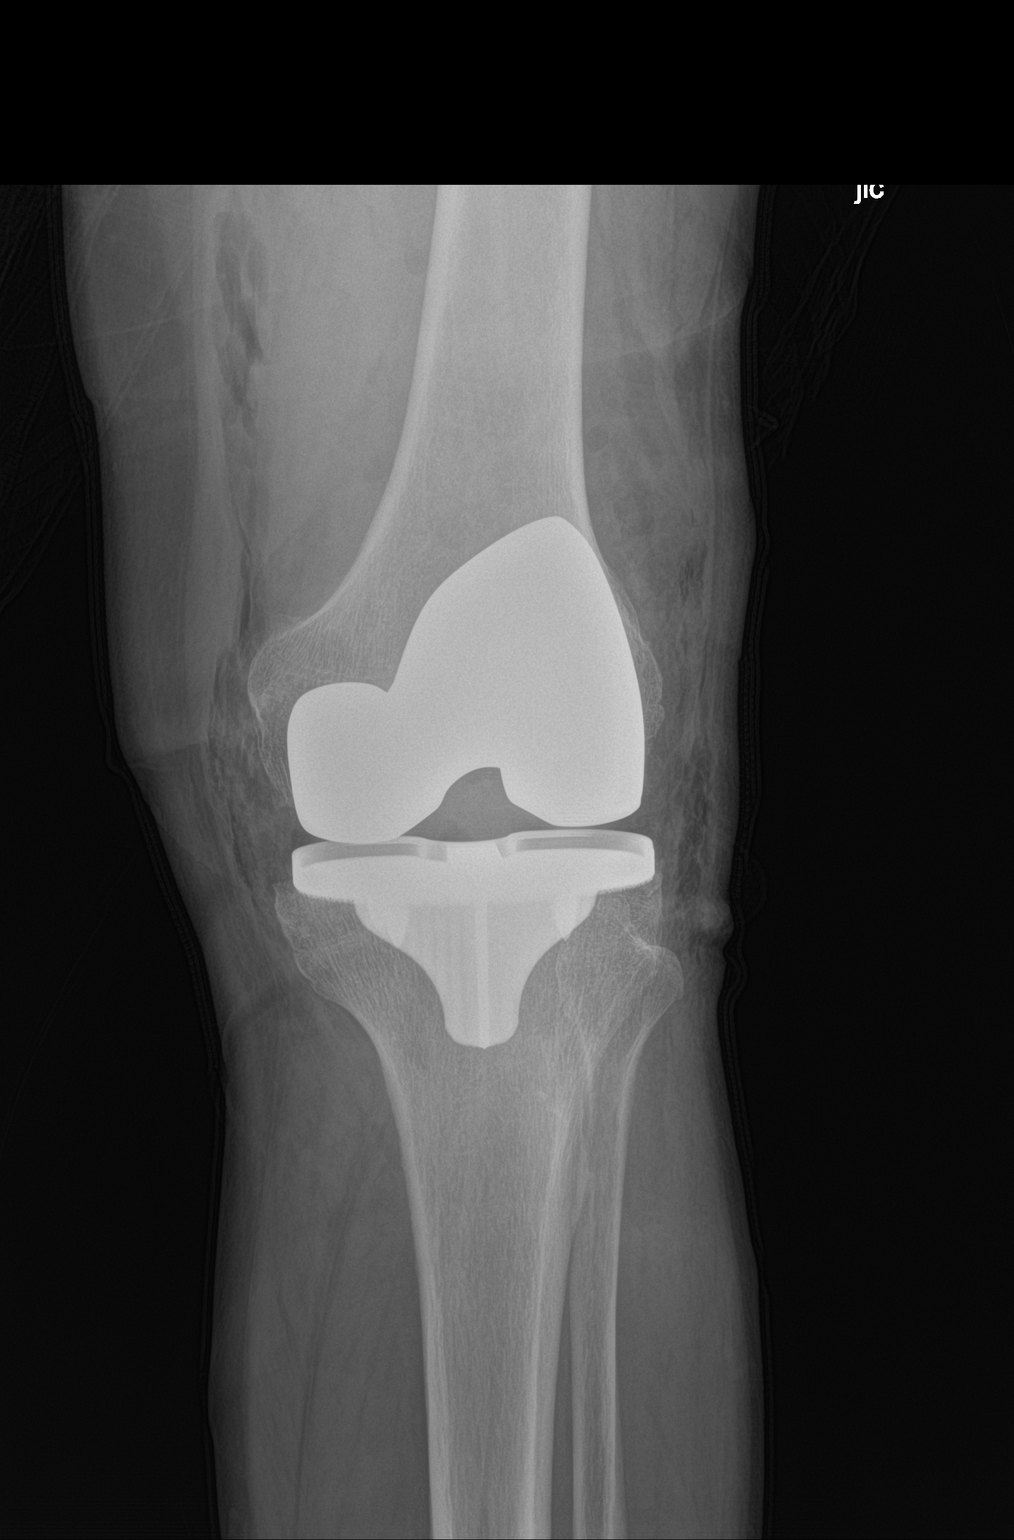

[knee lat]
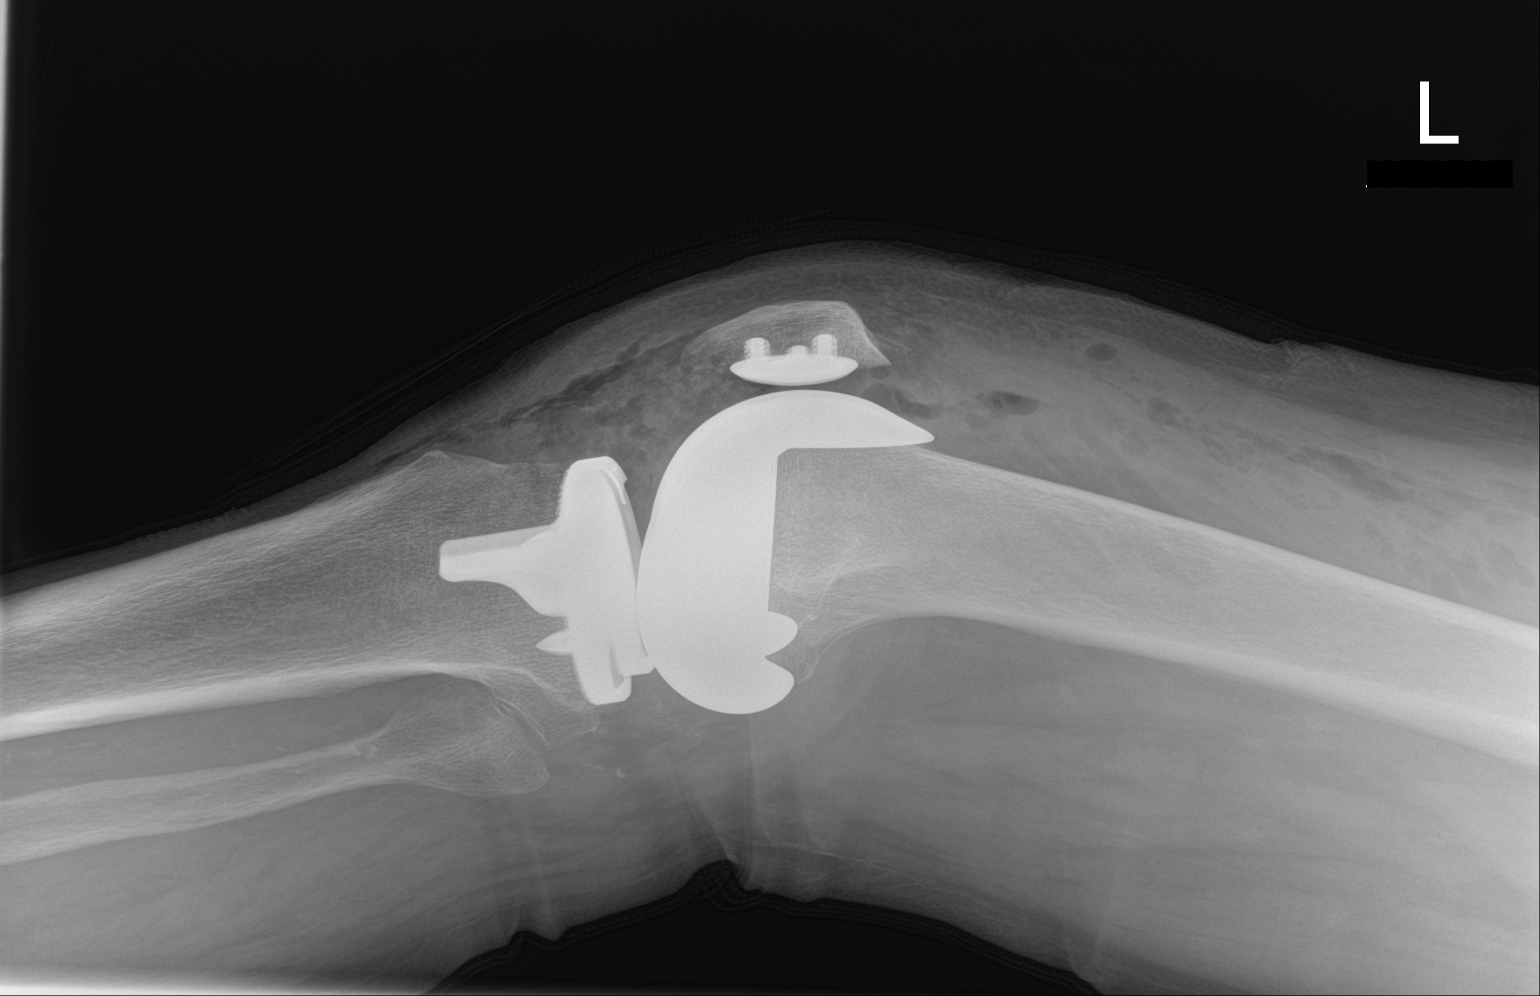

[2 of 2 positions shown; findings below may reference images not displayed]

FINDINGS: The patient has undergone total left knee joint prosthesis
placement. Radiographic positioning of the prosthetic components is
good. The interface with the native bone is normal. There are small
amounts of air in fluid in the anterior soft tissues.
IMPRESSION: No immediate postprocedure complication following left total knee
joint prosthesis placement.

## 2020-03-18 DIAGNOSIS — Z955 Presence of coronary angioplasty implant and graft: Secondary | ICD-10-CM | POA: Diagnosis not present

## 2020-03-18 DIAGNOSIS — E785 Hyperlipidemia, unspecified: Secondary | ICD-10-CM | POA: Diagnosis not present

## 2020-03-18 DIAGNOSIS — I10 Essential (primary) hypertension: Secondary | ICD-10-CM | POA: Diagnosis not present

## 2020-03-18 DIAGNOSIS — I2510 Atherosclerotic heart disease of native coronary artery without angina pectoris: Secondary | ICD-10-CM | POA: Diagnosis not present

## 2020-04-23 ENCOUNTER — Ambulatory Visit (INDEPENDENT_AMBULATORY_CARE_PROVIDER_SITE_OTHER): Payer: Medicare Other | Admitting: Otolaryngology

## 2020-04-23 ENCOUNTER — Other Ambulatory Visit: Payer: Self-pay

## 2020-04-23 VITALS — Temp 97.3°F

## 2020-04-23 DIAGNOSIS — H6123 Impacted cerumen, bilateral: Secondary | ICD-10-CM | POA: Diagnosis not present

## 2020-04-23 DIAGNOSIS — I2510 Atherosclerotic heart disease of native coronary artery without angina pectoris: Secondary | ICD-10-CM

## 2020-04-23 NOTE — Progress Notes (Signed)
HPI: Jose [name] is a 79 y.o. male who returns today for evaluation of wax in his ears.  He wears bilateral hearing aids.  He tries to keep the ears cleaned.  He was last seen here about 6 years ago but saw Dr. Ernesto Rutherford couple years ago.  He also has nasal congestion at night and asks about use of a nasal lavage..  Past Medical History:  Diagnosis Date  . Acute lower GI bleeding 01/25/2013; 09/2014   diverticular bleed  . Acute prostatitis 02/21/2019  . ANXIETY 08/22/2008  . Arthritis    "a little bit" (04/14/2015)  . Barrett's esophagus 2003   hematochezia as a result since 2003  . Basal cell cancer    removed from right forearm.   . Bradycardia 04/14/2015  . CORONARY ARTERY DISEASE    2 stents  . Depression   . DIVERTICULOSIS, COLON 2003  . ERECTILE DYSFUNCTION 08/22/2008  . GERD (gastroesophageal reflux disease)   . Heart murmur 04/14/2015   "they think I did; that's why I'm here" (04/14/2015)  . History of kidney stones   . HYPERLIPIDEMIA 02/09/2007  . Kidney stones    "blasted or passed" (04/14/2015)  . MI (myocardial infarction) ([place]) 2000  . Numbness    right side - history of   . OBESITY 03/20/2010  . Squamous cell cancer of skin of earlobe    removed from area behind right earlobe  . Stroke [facility])    4 yrs ago possible mini stroke -  tests never found anything   Past Surgical History:  Procedure Laterality Date  . BASAL CELL CARCINOMA EXCISION     posterior right ear.   Marland Kitchen CATARACT EXTRACTION, BILATERAL    . COLONOSCOPY N/A 01/26/2013   Procedure: COLONOSCOPY;  Surgeon: Milus Banister, MD;  Location: [facility];  Service: Endoscopy;  Laterality: N/A;  . CORONARY ANGIOPLASTY WITH STENT PLACEMENT  09/06/1998   LAD stent per Dr Elonda Husky in [place]. 2 stents.   . CYSTOSCOPY W/ URETERAL STENT PLACEMENT Left 12/21/2016   Procedure: CYSTOSCOPY WITH RETROGRADE PYELOGRAM/URETERAL STENT PLACEMENT;  Surgeon: Alexis Frock, MD;  [facility];  Service: Urology;  Laterality:  Left;  . ESOPHAGOGASTRODUODENOSCOPY (EGD) WITH ESOPHAGEAL DILATION     "q time I have an EGD; usually q 3-4 years" (04/14/2015)  . EXTRACORPOREAL SHOCK WAVE LITHOTRIPSY Left 12/30/2016   Procedure: LEFT EXTRACORPOREAL SHOCK WAVE LITHOTRIPSY (ESWL);  Surgeon: Carolan Clines, MD;  [facility];  Service: Urology;  Laterality: Left;  . LITHOTRIPSY  X 2  . SQUAMOUS CELL CARCINOMA EXCISION     right forearm  . TONSILLECTOMY  1971  . TOTAL KNEE ARTHROPLASTY Left 03/21/2018  . TOTAL KNEE ARTHROPLASTY Left 03/21/2018   Procedure: LEFT TOTAL KNEE ARTHROPLASTY;  Surgeon: Renette Butters, MD;  Location: [facility];  Service: Orthopedics;  Laterality: Left;  . UPPER GASTROINTESTINAL ENDOSCOPY    . URETERAL STENT PLACEMENT     2019   Social History   Socioeconomic History  . Marital status: Married    Spouse name: Not on file  . Number of children: Not on file  . Years of education: Not on file  . Highest education level: Not on file  Occupational History  . Not on file  Tobacco Use  . Smoking status: Former Smoker    Packs/day: 1.00    Years: 30.00    Pack years: 30.00    Types: Cigarettes  . Smokeless tobacco: Never Used  . Tobacco comment: "quit smoking  cigarettes in ~ 1990"  Vaping Use  . Vaping Use: Never used  Substance and Sexual Activity  . Alcohol use: Yes    Comment: 04/14/2015 "might drink a couple beers/month"  . Drug use: No  . Sexual activity: Never  Other Topics Concern  . Not on file  Social History Narrative   Still working at [employer]   Married 35 years in 2015, 4 kids from first marriage, 3 grandkids   Hobbies: woodworking, tv movies- old action movies like Rambo   Social Determinants of Health   Financial Resource Strain:   . Difficulty of Paying Living Expenses: Not on file  Food Insecurity:   . Worried About Charity fundraiser in the Last Year: Not on file  . Ran Out of Food in the Last Year: Not on file  Transportation Needs:   . Lack of  Transportation (Medical): Not on file  . Lack of Transportation (Non-Medical): Not on file  Physical Activity:   . Days of Exercise per Week: Not on file  . Minutes of Exercise per Session: Not on file  Stress:   . Feeling of Stress : Not on file  Social Connections:   . Frequency of Communication with Friends and Family: Not on file  . Frequency of Social Gatherings with Friends and Family: Not on file  . Attends Religious Services: Not on file  . Active Member of Clubs or Organizations: Not on file  . Attends Archivist Meetings: Not on file  . Marital Status: Not on file   Family History  Problem Relation Age of Onset  . Alcohol abuse Father   . Liver disease Father   . Heart disease Brother        CABG  . Colon cancer Neg Hx    Allergies  Allergen Reactions  . Doxycycline     Vomiting but may have been due to COVID-19  . Lipitor [Atorvastatin]     REACTION: muscle weakness   Prior to Admission medications   Medication Sig Start Date End Date Taking? Authorizing Provider  amoxicillin-clavulanate (AUGMENTIN) 875-125 MG tablet Take 1 tablet by mouth every 12 (twelve) hours. 01/25/20  Yes Yu, Amy V, PA-C  aspirin EC 81 MG tablet Take 1 tablet (81 mg total) by mouth 2 (two) times daily. For DVT prophylaxis Patient taking differently: Take 81 mg by mouth daily. For DVT prophylaxis 03/21/18  Yes Prudencio Burly III, PA-C  fluticasone (FLONASE) 50 MCG/ACT nasal spray Place 2 sprays into both nostrils daily. 01/25/20  Yes Yu, Amy V, PA-C  ipratropium (ATROVENT) 0.06 % nasal spray Place 2 sprays into both nostrils 4 (four) times daily. 01/25/20  Yes Yu, Amy V, PA-C  omeprazole (PRILOSEC) 40 MG capsule Take 1 capsule (40 mg total) by mouth daily. 04/04/19  Yes Ladene Artist, MD  rosuvastatin (CRESTOR) 20 MG tablet TAKE 1 TABLET DAILY 01/07/20  Yes Marin Olp, MD  tadalafil (CIALIS) 20 MG tablet TAKE ONE-HALF (1/2) TABLET DAILY AS NEEDED FOR ERECTILE DYSFUNCTION  01/07/20  Yes Marin Olp, MD  traMADol (ULTRAM) 50 MG tablet Take 1-2 tablets (50-100 mg total) by mouth 2 (two) times daily as needed for severe pain. 12/03/19  Yes Marin Olp, MD     Positive ROS: Otherwise negative  All other systems have been reviewed and were otherwise negative with the exception of those mentioned in the HPI and as above.  Physical Exam: Constitutional: Alert, well-appearing, no acute distress Ears:  External ears without lesions or tenderness. Ear canals with a large amount of wax and hairs in both ear canals that was cleaned with suction and forceps.  TMs were clear bilaterally. Nasal: External nose without lesions. Clear nasal passages Oral: Lips and gums without lesions. Tongue and palate mucosa without lesions. Posterior oropharynx clear. Neck: No palpable adenopathy or masses Respiratory: Breathing comfortably  Skin: No facial/neck lesions or rash noted.  Cerumen impaction removal  Date/Time: 04/23/2020 11:05 AM Performed by: Rozetta Nunnery, MD Authorized by: Rozetta Nunnery, MD   Consent:    Consent obtained:  Verbal   Consent given by:  Patient   Risks discussed:  Pain and bleeding Procedure details:    Location:  L ear and R ear   Procedure type: suction and forceps   Post-procedure details:    Inspection:  TM intact and canal normal   Hearing quality:  Improved   Patient tolerance of procedure:  Tolerated well, no immediate complications Comments:     TMs are clear bilaterally    Assessment: Bilateral cerumen buildup in patient with bilateral hearing aids. Chronic rhinitis  Plan: Ear canals were cleaned in the office.  He will follow-up in 1 to 2 years for recheck and cleaning. Suggested regular use of Nasacort or Flonase to help with nasal congestion at night.  He can also try the nasal lavage. He will follow-up as needed   Radene Journey, MD

## 2020-05-12 ENCOUNTER — Other Ambulatory Visit: Payer: Self-pay | Admitting: Gastroenterology

## 2020-06-02 NOTE — Patient Instructions (Addendum)
Health Maintenance Due  Topic Date Due  . TETANUS/TDAP - consider at your pharmacy 08/30/2013  . INFLUENZA VACCINE Declined in office flu shot today. He prefers to get it in November. Let us know when you get this 03/30/2020   Call back or schedule labs at the desk before you leave (if they are still here)  Call dr. Fuller Plan when you get close to finishing 40mg  and see if hes ok with 20mg  omeprazole  congrats on your awesome weight loss! Keep up the great work

## 2020-06-02 NOTE — Progress Notes (Signed)
Phone [phone] In person visit   Subjective:   Jose [name] is a 79 y.o. year old very pleasant male patient who presents for/with See problem oriented charting Chief Complaint  Patient presents with  . Coronary Artery Disease  . Hyperlipidemia   This visit occurred during the SARS-CoV-2 public health emergency.  Safety protocols were in place, including screening questions prior to the visit, additional usage of staff PPE, and extensive cleaning of exam room while observing appropriate contact time as indicated for disinfecting solutions.   Past Medical History-  Patient Active Problem List   Diagnosis Date Noted  . History of COVID-19.  Hospitalization November 2020 with acute respiratory failure. 07/09/2019    Priority: High  . Aortic aneurysm ([place]) 06/04/2019    Priority: High  . Atherosclerosis of coronary artery 02/09/2007    Priority: High  . Barrett's esophagus 02/09/2007    Priority: High  . Fatty liver 06/04/2019    Priority: Medium  . GERD (gastroesophageal reflux disease) 10/23/2018    Priority: Medium  . Primary osteoarthritis of left knee 02/27/2018    Priority: Medium  . Elevated PSA 08/03/2016    Priority: Medium  . Benign prostatic hypertrophy (BPH) with nocturia 05/27/2016    Priority: Medium  . Hyperglycemia 02/26/2013    Priority: Medium  . Hyperlipidemia 02/09/2007    Priority: Medium  . Aortic atherosclerosis ([place]) 06/04/2019    Priority: Low  . Acute prostatitis 02/21/2019    Priority: Low  . Nasal congestion 10/23/2018    Priority: Low  . Lumbosacral radiculopathy 06/17/2015    Priority: Low  . Numbness 06/17/2015    Priority: Low  . Right leg numbness     Priority: Low  . Sinus bradycardia 03/19/2015    Priority: Low  . Diverticulosis of colon with hemorrhage     Priority: Low  . Actinic keratosis 08/07/2014    Priority: Low  . Elevated blood pressure 03/29/2014    Priority: Low  . OBESITY 03/20/2010    Priority: Low  .  Generalized anxiety disorder 08/22/2008    Priority: Low  . ERECTILE DYSFUNCTION 08/22/2008    Priority: Low  . Diverticulosis of large intestine 02/09/2007    Priority: Low  . Gastrointestinal hemorrhage 10/15/2014    Medications- reviewed and updated Current Outpatient Medications  Medication Sig Dispense Refill  . aspirin EC 81 MG tablet Take 1 tablet (81 mg total) by mouth 2 (two) times daily. For DVT prophylaxis (Patient taking differently: Take 81 mg by mouth daily. For DVT prophylaxis) 60 tablet 0  . omeprazole (PRILOSEC) 40 MG capsule TAKE 1 CAPSULE DAILY 90 capsule 1  . rosuvastatin (CRESTOR) 20 MG tablet TAKE 1 TABLET DAILY 90 tablet 3  . tadalafil (CIALIS) 20 MG tablet TAKE ONE-HALF (1/2) TABLET DAILY AS NEEDED FOR ERECTILE DYSFUNCTION 8 tablet 17  . traMADol (ULTRAM) 50 MG tablet Take 1-2 tablets (50-100 mg total) by mouth 2 (two) times daily as needed for severe pain. 60 tablet 5  . fluticasone (FLONASE) 50 MCG/ACT nasal spray Place 2 sprays into both nostrils daily. (Patient not taking: Reported on 06/03/2020) 1 g 0  . ipratropium (ATROVENT) 0.06 % nasal spray Place 2 sprays into both nostrils 4 (four) times daily. (Patient not taking: Reported on 06/03/2020) 15 mL 0   No current facility-administered medications for this visit.     Objective:  BP 124/84   Pulse 62   Temp 98.6 F (37 C) (Temporal)   Resp 18   Ht  5\' 6"  (1.676 m)   Wt 193 lb 12.8 oz (87.9 kg)   SpO2 96%   BMI 31.28 kg/m  Gen: NAD, resting comfortably CV: RRR no murmurs rubs or gallops Lungs: CTAB no crackles, wheeze, rhonchi Abdomen: Significant weight loss noted Ext: no edema Skin: warm, dry     Assessment and Plan   #fatigue from prior covid has largely resolved.   #Hyperglycemia/obesity- states got tired of being overweight- wanted to see if it would help his knee s: Diet controlled in the past- weight has been trending up since July though  Down 22 lbs from last visit. Cut out sugary  drinks and candy (sparing splurges) and has cut down midnight snacks. Remains active with work Lab Results  Component Value Date   HGBA1C 5.7 12/03/2019  A/P: hopefully improved- will update a1c-he agrees to come back for labs   #CAD-followed in [place]- compliant with aspirin #Hyperlipidemia  s: Well controlled on rosuvastatin 20 mg.  Ideal LDL under 70. Saw cardiology recently Lab Results  Component Value Date   CHOL 113 12/03/2019   HDL 39.40 12/03/2019   LDLCALC 58 12/03/2019   LDLDIRECT 100.0 12/19/2017   TRIG 79.0 12/03/2019   CHOLHDL 3 12/03/2019   A/P:  CAD asymptomatic. HLD at goal LDL under 70- continue current meds.   # left knee OA S: History of left knee replacement-with continued pain in the left knee despite this patient using 1 tramadol in the morning and 1 in the evening for his knees.  We received a letter from [facility] in 2020 requesting that we begin his long-term tramadol prescription. He has bene tolerating A/P: Reasonable control on tramadol-refilled today for primarily twice daily use - may want mail order-he will reach out about this  #GERD/Barrett's esophagus S: doing well on omeprazole 40mg  per Dr. Fuller Plan. Only issue he gets is some phlegm clearing- discussed could also be allergy related.  A/P: Patient would like to see if he can tolerate 20 mg-I asked him to reach out to Dr. Fuller Plan when he is running low on 40 mg pills to see if Dr. Fuller Plan is okay with a lower trial  # Fatty liver S:  Noted on CT 04/20/2019. Peak weight 220 and now down to 193.8 A/P: I am hopeful this is improving with weight loss-suspect it is.  Update LFTs with labs-most recent LFTs have been improved  # Aortic aneurysm S: 3 cm aneurysm noted on ct 04/20/2019- 3 year repeat US planned.  A/P: Too early for repeat-continue to monitor  #Elevated PSA-following up with urology yearly at least. Was around 5.5 in august and apparently came down November 2020 around 4 or lower- planned  yearly follow up Lab Results  Component Value Date   PSA 2.93 03/16/2018   PSA 4.25 (H) 12/19/2017   PSA 3.07 07/28/2016   #History of COVID-19 with hospitalization November 2020-patient would like to see if he still has COVID-19 antibodies-he is also been vaccinated  Recommended follow up: Return in about 6 months (around 12/02/2020) for follow up- or sooner if needed.  Lab/Order associations:   ICD-10-CM   1. Gastroesophageal reflux disease, unspecified whether esophagitis present  K21.9   2. Generalized anxiety disorder  F41.1   3. Hyperglycemia  R73.9 Hemoglobin A1c  4. Hyperlipidemia, unspecified hyperlipidemia type  X72.6 COMPLETE METABOLIC PANEL WITH GFR  5. Encounter for hepatitis C screening test for low risk patient  Z11.59 Hepatitis C antibody  6. History of COVID-19  Z86.16 SARS CoV2  Serology(COVID19) AB(IgG,IgM),Immunoassay    Meds ordered this encounter  Medications  . traMADol (ULTRAM) 50 MG tablet    Sig: Take 1-2 tablets (50-100 mg total) by mouth 2 (two) times daily as needed for severe pain.    Dispense:  60 tablet    Refill:  5   Return precautions advised.  [name], MD

## 2020-06-03 ENCOUNTER — Encounter: Payer: Self-pay | Admitting: Family Medicine

## 2020-06-03 ENCOUNTER — Other Ambulatory Visit: Payer: Self-pay

## 2020-06-03 ENCOUNTER — Ambulatory Visit (INDEPENDENT_AMBULATORY_CARE_PROVIDER_SITE_OTHER): Payer: Medicare Other | Admitting: Family Medicine

## 2020-06-03 VITALS — BP 124/84 | HR 62 | Temp 98.6°F | Resp 18 | Ht 66.0 in | Wt 193.8 lb

## 2020-06-03 DIAGNOSIS — I714 Abdominal aortic aneurysm, without rupture, unspecified: Secondary | ICD-10-CM

## 2020-06-03 DIAGNOSIS — Z1159 Encounter for screening for other viral diseases: Secondary | ICD-10-CM | POA: Diagnosis not present

## 2020-06-03 DIAGNOSIS — K760 Fatty (change of) liver, not elsewhere classified: Secondary | ICD-10-CM | POA: Diagnosis not present

## 2020-06-03 DIAGNOSIS — R739 Hyperglycemia, unspecified: Secondary | ICD-10-CM | POA: Diagnosis not present

## 2020-06-03 DIAGNOSIS — Z8616 Personal history of COVID-19: Secondary | ICD-10-CM

## 2020-06-03 DIAGNOSIS — I2510 Atherosclerotic heart disease of native coronary artery without angina pectoris: Secondary | ICD-10-CM | POA: Diagnosis not present

## 2020-06-03 DIAGNOSIS — M1712 Unilateral primary osteoarthritis, left knee: Secondary | ICD-10-CM

## 2020-06-03 DIAGNOSIS — E785 Hyperlipidemia, unspecified: Secondary | ICD-10-CM

## 2020-06-03 MED ORDER — TRAMADOL HCL 50 MG PO TABS: 50.0000 mg | ORAL_TABLET | Freq: Two times a day (BID) | ORAL | 5 refills | Status: DC | PRN

## 2020-06-11 ENCOUNTER — Other Ambulatory Visit: Payer: Self-pay

## 2020-06-11 ENCOUNTER — Other Ambulatory Visit: Payer: Medicare Other

## 2020-06-11 DIAGNOSIS — Z8616 Personal history of COVID-19: Secondary | ICD-10-CM

## 2020-06-11 DIAGNOSIS — E785 Hyperlipidemia, unspecified: Secondary | ICD-10-CM

## 2020-06-11 DIAGNOSIS — Z1159 Encounter for screening for other viral diseases: Secondary | ICD-10-CM | POA: Diagnosis not present

## 2020-06-11 DIAGNOSIS — R739 Hyperglycemia, unspecified: Secondary | ICD-10-CM

## 2020-06-12 LAB — COMPLETE METABOLIC PANEL WITH GFR
AG Ratio: 1.6 (calc) (ref 1.0–2.5)
ALT: 15 U/L (ref 9–46)
AST: 21 U/L (ref 10–35)
Albumin: 4.1 g/dL (ref 3.6–5.1)
Alkaline phosphatase (APISO): 92 U/L (ref 35–144)
BUN: 15 mg/dL (ref 7–25)
CO2: 23 mmol/L (ref 20–32)
Calcium: 9.6 mg/dL (ref 8.6–10.3)
Chloride: 107 mmol/L (ref 98–110)
Creat: 1.05 mg/dL (ref 0.70–1.18)
GFR, Est African American: 80 mL/min/{1.73_m2} (ref >=60)
GFR, Est Non African American: 69 mL/min/{1.73_m2} (ref >=60)
Globulin: 2.5 g/dL (calc) (ref 1.9–3.7)
Glucose, Bld: 88 mg/dL (ref 65–99)
Potassium: 4.3 mmol/L (ref 3.5–5.3)
Sodium: 141 mmol/L (ref 135–146)
Total Bilirubin: 1 mg/dL (ref 0.2–1.2)
Total Protein: 6.6 g/dL (ref 6.1–8.1)

## 2020-06-12 LAB — HEPATITIS C ANTIBODY
Hepatitis C Ab: NONREACTIVE
SIGNAL TO CUT-OFF: 0.04 (ref <1.00)

## 2020-06-12 LAB — SARS COV-2 SEROLOGY(COVID-19)AB(IGG,IGM),IMMUNOASSAY
SARS CoV-2 AB IgG: POSITIVE — AB
SARS CoV-2 IgM: NEGATIVE

## 2020-06-12 LAB — HEMOGLOBIN A1C
Hgb A1c MFr Bld: 5.5 % of total Hgb (ref <5.7)
Mean Plasma Glucose: 111 (calc)
eAG (mmol/L): 6.2 (calc)

## 2020-06-13 ENCOUNTER — Telehealth: Payer: Self-pay

## 2020-06-13 NOTE — Telephone Encounter (Signed)
Pt would like a call to understand his labwork.

## 2020-06-13 NOTE — Telephone Encounter (Signed)
Spoke with the patient and he verbalized understanding his results. No other questions or concerns at this time.

## 2020-06-13 NOTE — Telephone Encounter (Signed)
I don't see where you have commented on his lab results. Please advise

## 2020-06-13 NOTE — Telephone Encounter (Signed)
I am running behind on labs but commented on his. I will try to complete all by Monda/possibly saturday

## 2020-08-08 ENCOUNTER — Telehealth: Payer: Self-pay | Admitting: Gastroenterology

## 2020-08-08 NOTE — Telephone Encounter (Signed)
Tried to reach Jose [name] to see why he wanted to switch to the lower dose of omeprazole and I got his voicemail. I left him a detailed message to call us back Monday.

## 2020-08-08 NOTE — Telephone Encounter (Signed)
Pt is requesting to change the dosage of her medication Omeprazole to 20mg .

## 2020-08-11 NOTE — Telephone Encounter (Signed)
Patient states he was unsure if he should continue omeprazole 40 mg daily or should decrease to 20 mg daily. Patient states he is not having any issues but spoke with his PCP and PCP recommended he discuss this decrease with his gastroenterologist. Informed patient that he is due for his recall EGD in February for barrett's esophagus and Dr. Fuller Plan would probably recommend that he remain on this regimen until EGD. Patient states he will remain on omeprazole 40 mg daily and discuss at his upcoming EGD.

## 2020-11-04 ENCOUNTER — Other Ambulatory Visit: Payer: Self-pay | Admitting: Gastroenterology

## 2020-12-09 ENCOUNTER — Telehealth: Payer: Medicare Other | Admitting: Family Medicine

## 2020-12-10 ENCOUNTER — Telehealth (INDEPENDENT_AMBULATORY_CARE_PROVIDER_SITE_OTHER): Payer: Medicare Other | Admitting: Family Medicine

## 2020-12-10 ENCOUNTER — Encounter: Payer: Self-pay | Admitting: Family Medicine

## 2020-12-10 VITALS — Wt 180.0 lb

## 2020-12-10 DIAGNOSIS — I2510 Atherosclerotic heart disease of native coronary artery without angina pectoris: Secondary | ICD-10-CM | POA: Diagnosis not present

## 2020-12-10 DIAGNOSIS — E785 Hyperlipidemia, unspecified: Secondary | ICD-10-CM | POA: Diagnosis not present

## 2020-12-10 DIAGNOSIS — Z79899 Other long term (current) drug therapy: Secondary | ICD-10-CM

## 2020-12-10 DIAGNOSIS — K760 Fatty (change of) liver, not elsewhere classified: Secondary | ICD-10-CM

## 2020-12-10 MED ORDER — TRAMADOL HCL 50 MG PO TABS: 50.0000 mg | ORAL_TABLET | Freq: Two times a day (BID) | ORAL | 5 refills | Status: DC | PRN

## 2020-12-10 NOTE — Patient Instructions (Addendum)
Health Maintenance Due  Topic Date Due  . TETANUS/TDAP Discuss.  08/30/2013      Recommended follow up: No follow-ups on file.

## 2020-12-10 NOTE — Progress Notes (Signed)
Phone [phone] Virtual visit via phonenote   Subjective:   Chief Complaint  Patient presents with  . Hyperglycemia  . Gastroesophageal Reflux  . Medication Refill    Tramadol    This visit type was conducted due to national recommendations for restrictions regarding the COVID-19 Pandemic (e.g. social distancing).  This format is felt to be most appropriate for this patient at this time balancing risks to patient and risks to population by having him in for in person visit.  All issues noted in this document were discussed and addressed.  No physical exam was performed (except for noted visual exam or audio findings with Telehealth visits).  The patient has consented to conduct a Telehealth visit and understands insurance will be billed.   Our team/I connected with Jose [name] at  3:00 PM EDT by phone (patient did not have equipment for webex) and verified that I am speaking with the correct person using two identifiers.  Location patient: [facility] Location provider: [facility], office Persons participating in the virtual visit:  patient  Time on phone: 12 minutes  Our team/I discussed the limitations of evaluation and management by telemedicine and the availability of in person appointments. In light of current covid-19 pandemic, patient also understands that we are trying to protect them by minimizing in office contact if at all possible.  The patient expressed consent for telemedicine visit and agreed to proceed. Patient understands insurance will be billed.   Past Medical History-  Patient Active Problem List   Diagnosis Date Noted  . History of COVID-19.  Hospitalization November 2020 with acute respiratory failure. 07/09/2019    Priority: High  . Aortic aneurysm ([place]) 06/04/2019    Priority: High  . Atherosclerosis of coronary artery 02/09/2007    Priority: High  . Barrett's esophagus 02/09/2007    Priority: High  . Fatty liver 06/04/2019    Priority: Medium  .  GERD (gastroesophageal reflux disease) 10/23/2018    Priority: Medium  . Primary osteoarthritis of left knee 02/27/2018    Priority: Medium  . Elevated PSA 08/03/2016    Priority: Medium  . Benign prostatic hypertrophy (BPH) with nocturia 05/27/2016    Priority: Medium  . Hyperglycemia 02/26/2013    Priority: Medium  . Hyperlipidemia 02/09/2007    Priority: Medium  . Aortic atherosclerosis ([place]) 06/04/2019    Priority: Low  . Acute prostatitis 02/21/2019    Priority: Low  . Nasal congestion 10/23/2018    Priority: Low  . Lumbosacral radiculopathy 06/17/2015    Priority: Low  . Numbness 06/17/2015    Priority: Low  . Right leg numbness     Priority: Low  . Sinus bradycardia 03/19/2015    Priority: Low  . Diverticulosis of colon with hemorrhage     Priority: Low  . Actinic keratosis 08/07/2014    Priority: Low  . Elevated blood pressure 03/29/2014    Priority: Low  . OBESITY 03/20/2010    Priority: Low  . Generalized anxiety disorder 08/22/2008    Priority: Low  . ERECTILE DYSFUNCTION 08/22/2008    Priority: Low  . Diverticulosis of large intestine 02/09/2007    Priority: Low  . Gastrointestinal hemorrhage 10/15/2014    Medications- reviewed and updated Current Outpatient Medications  Medication Sig Dispense Refill  . aspirin EC 81 MG tablet Take 1 tablet (81 mg total) by mouth 2 (two) times daily. For DVT prophylaxis (Patient taking differently: Take 81 mg by mouth daily. For DVT prophylaxis) 60 tablet 0  .  fluticasone (FLONASE) 50 MCG/ACT nasal spray Place 2 sprays into both nostrils daily. (Patient taking differently: Place 2 sprays into both nostrils as needed.) 1 g 0  . ipratropium (ATROVENT) 0.06 % nasal spray Place 2 sprays into both nostrils 4 (four) times daily. (Patient taking differently: Place 2 sprays into both nostrils as needed.) 15 mL 0  . omeprazole (PRILOSEC) 40 MG capsule TAKE 1 CAPSULE DAILY 90 capsule 0  . rosuvastatin (CRESTOR) 20 MG tablet TAKE 1  TABLET DAILY 90 tablet 3  . tadalafil (CIALIS) 20 MG tablet TAKE ONE-HALF (1/2) TABLET DAILY AS NEEDED FOR ERECTILE DYSFUNCTION 8 tablet 17  . traMADol (ULTRAM) 50 MG tablet Take 1-2 tablets (50-100 mg total) by mouth 2 (two) times daily as needed for severe pain. 60 tablet 5   No current facility-administered medications for this visit.     Objective:  Wt 180 lb (81.6 kg)   BMI 29.05 kg/m  self reported vitals  Nonlabored voice, normal speech      Assessment and Plan    #Hyperglycemia s: Diet controlled in the past- weight has been trending up since July though Lab Results  Component Value Date   HGBA1C 5.5 06/11/2020  A/P: a1c on last check normal plus continuing weight los- hold off on repeat for now   #CAD-followed in [facility]- remains on aspirin #Hyperlipidemia  s: Well controlled on rosuvastatin 20 mg.  Ideal LDL under 70 Lab Results  Component Value Date   CHOL 113 12/03/2019   HDL 39.40 12/03/2019   LDLCALC 58 12/03/2019   LDLDIRECT 100.0 12/19/2017   TRIG 79.0 12/03/2019   CHOLHDL 3 12/03/2019   A/P: Has been at goal-continue current medication    #osteoarthritis  S: History of left knee replacement-with continued pain in the left knee despite this patient using 1 tramadol in the morning and 1 in the evening for his knees.  We received a letter from [facility] in 2020 requesting that we begin his long-term tramadol prescription.  Reports some days doesn't take any but most day stakes 2 a day A/P:appears stable- continue current medicine. PDMP reviewed.    #GERD/Barrett's esophagus S: doing well on omeprazole 40mg  per Dr. Fuller Plan.  A/P: Stable. Continue current medications.  He will call son to schedule his 3 year endocopy - check b12 with logn term PPI use  # Fatty liver S:  Noted on CT 04/20/2019. Weight down 36 lbs from peak A/P: congratulated on progress on weight loss- main treatment for fatty liver  -monitor LFTs  # Aortic aneurysm S: 3 cm  aneurysm noted on ct 04/20/2019- 3 year repeat US planned.  A/P: likely stable- repeat next year.    #Elevated PSA-following up with urology yearly at least. Was around 5.5 in august and apparently came down November 2020 around 4 - planned yearly follow up- states around time for his yearly check  Recommended follow up:  Return in about 6 months (around 06/11/2021) for follow up- or sooner if needed.. Schedule lab visit for next wednesday  Lab/Order associations:   ICD-10-CM   1. Hyperlipidemia, unspecified hyperlipidemia type  E78.5 Comprehensive metabolic panel    CBC with Differential/Platelet    Lipid panel  2. Atherosclerosis of native coronary artery of native heart without angina pectoris  I25.10   3. Fatty liver  K76.0   4. High risk medication use  Z79.899 Vitamin B12    Meds ordered this encounter  Medications  . traMADol (ULTRAM) 50 MG tablet  Sig: Take 1-2 tablets (50-100 mg total) by mouth 2 (two) times daily as needed for severe pain.    Dispense:  60 tablet    Refill:  5    Return precautions advised.  Jose Reddish, MD

## 2020-12-16 ENCOUNTER — Telehealth: Payer: Self-pay | Admitting: Family Medicine

## 2020-12-16 NOTE — Progress Notes (Signed)
  Chronic Care Management   Outreach Note  12/16/2020 Name: Jose [name] MRN: [id number] DOB: 1941/05/19  Referred by: Marin Olp, MD Reason for referral : No chief complaint on file.   An unsuccessful telephone outreach was attempted today. The patient was referred to the pharmacist for assistance with care management and care coordination.   Follow Up Plan:   Lauretta Grill Upstream Scheduler

## 2020-12-17 ENCOUNTER — Encounter: Payer: Self-pay | Admitting: Family Medicine

## 2020-12-17 ENCOUNTER — Other Ambulatory Visit: Payer: Self-pay

## 2020-12-17 ENCOUNTER — Other Ambulatory Visit (INDEPENDENT_AMBULATORY_CARE_PROVIDER_SITE_OTHER): Payer: Medicare Other

## 2020-12-17 DIAGNOSIS — E785 Hyperlipidemia, unspecified: Secondary | ICD-10-CM

## 2020-12-17 DIAGNOSIS — Z79899 Other long term (current) drug therapy: Secondary | ICD-10-CM

## 2020-12-17 DIAGNOSIS — E538 Deficiency of other specified B group vitamins: Secondary | ICD-10-CM | POA: Insufficient documentation

## 2020-12-17 LAB — LIPID PANEL
Cholesterol: 110 mg/dL (ref 0–200)
HDL: 41.5 mg/dL (ref >39.00)
LDL Cholesterol: 59 mg/dL (ref 0–99)
NonHDL: 68.2
Total CHOL/HDL Ratio: 3
Triglycerides: 46 mg/dL (ref 0.0–149.0)
VLDL: 9.2 mg/dL (ref 0.0–40.0)

## 2020-12-17 LAB — COMPREHENSIVE METABOLIC PANEL
ALT: 14 U/L (ref 0–53)
AST: 17 U/L (ref 0–37)
Albumin: 3.8 g/dL (ref 3.5–5.2)
Alkaline Phosphatase: 84 U/L (ref 39–117)
BUN: 14 mg/dL (ref 6–23)
CO2: 26 mEq/L (ref 19–32)
Calcium: 9 mg/dL (ref 8.4–10.5)
Chloride: 104 mEq/L (ref 96–112)
Creatinine, Ser: 0.98 mg/dL (ref 0.40–1.50)
GFR: 74.48 mL/min (ref >60.00)
Glucose, Bld: 92 mg/dL (ref 70–99)
Potassium: 4 mEq/L (ref 3.5–5.1)
Sodium: 138 mEq/L (ref 135–145)
Total Bilirubin: 0.9 mg/dL (ref 0.2–1.2)
Total Protein: 6.2 g/dL (ref 6.0–8.3)

## 2020-12-17 LAB — CBC WITH DIFFERENTIAL/PLATELET
Basophils Absolute: 0 10*3/uL (ref 0.0–0.1)
Basophils Relative: 0.7 % (ref 0.0–3.0)
Eosinophils Absolute: 0.1 10*3/uL (ref 0.0–0.7)
Eosinophils Relative: 1.7 % (ref 0.0–5.0)
HCT: 42.5 % (ref 39.0–52.0)
Hemoglobin: 14.5 g/dL (ref 13.0–17.0)
Lymphocytes Relative: 23.5 % (ref 12.0–46.0)
Lymphs Abs: 0.9 10*3/uL (ref 0.7–4.0)
MCHC: 34.1 g/dL (ref 30.0–36.0)
MCV: 86.1 fl (ref 78.0–100.0)
Monocytes Absolute: 0.5 10*3/uL (ref 0.1–1.0)
Monocytes Relative: 14.4 % — ABNORMAL HIGH (ref 3.0–12.0)
Neutro Abs: 2.2 10*3/uL (ref 1.4–7.7)
Neutrophils Relative %: 59.7 % (ref 43.0–77.0)
Platelets: 138 10*3/uL — ABNORMAL LOW (ref 150.0–400.0)
RBC: 4.94 Mil/uL (ref 4.22–5.81)
RDW: 14 % (ref 11.5–15.5)
WBC: 3.6 10*3/uL — ABNORMAL LOW (ref 4.0–10.5)

## 2020-12-17 LAB — VITAMIN B12: Vitamin B-12: 138 pg/mL — ABNORMAL LOW (ref 211–911)

## 2021-01-02 ENCOUNTER — Telehealth: Payer: Self-pay | Admitting: Family Medicine

## 2021-01-02 NOTE — Chronic Care Management (AMB) (Signed)
  Chronic Care Management   Outreach Note  01/02/2021 Name: Jose [name] MRN: [id number] DOB: 05/11/1941  Referred by: Marin Olp, MD Reason for referral : No chief complaint on file.   A second unsuccessful telephone outreach was attempted today. The patient was referred to pharmacist for assistance with care management and care coordination.  Follow Up Plan:   Lauretta Grill Upstream Scheduler

## 2021-01-06 ENCOUNTER — Other Ambulatory Visit: Payer: Self-pay | Admitting: Family Medicine

## 2021-01-15 DIAGNOSIS — E785 Hyperlipidemia, unspecified: Secondary | ICD-10-CM | POA: Diagnosis not present

## 2021-01-15 DIAGNOSIS — I10 Essential (primary) hypertension: Secondary | ICD-10-CM | POA: Diagnosis not present

## 2021-01-15 DIAGNOSIS — R0600 Dyspnea, unspecified: Secondary | ICD-10-CM | POA: Diagnosis not present

## 2021-01-15 DIAGNOSIS — Z955 Presence of coronary angioplasty implant and graft: Secondary | ICD-10-CM | POA: Diagnosis not present

## 2021-01-15 DIAGNOSIS — R0609 Other forms of dyspnea: Secondary | ICD-10-CM | POA: Insufficient documentation

## 2021-01-15 DIAGNOSIS — I2510 Atherosclerotic heart disease of native coronary artery without angina pectoris: Secondary | ICD-10-CM | POA: Diagnosis not present

## 2021-01-30 ENCOUNTER — Telehealth: Payer: Self-pay | Admitting: Family Medicine

## 2021-01-30 NOTE — Progress Notes (Signed)
  Chronic Care Management   Outreach Note  01/30/2021 Name: Jose [name] MRN: [id number] DOB: 04-12-1941  Referred by: Marin Olp, MD Reason for referral : No chief complaint on file.   A second unsuccessful telephone outreach was attempted today. The patient was referred to pharmacist for assistance with care management and care coordination.  Follow Up Plan:   Lauretta Grill Upstream Scheduler

## 2021-02-06 ENCOUNTER — Other Ambulatory Visit: Payer: Self-pay

## 2021-02-06 ENCOUNTER — Ambulatory Visit (INDEPENDENT_AMBULATORY_CARE_PROVIDER_SITE_OTHER): Payer: Medicare Other

## 2021-02-06 DIAGNOSIS — Z0000 Encounter for general adult medical examination without abnormal findings: Secondary | ICD-10-CM | POA: Diagnosis not present

## 2021-02-06 NOTE — Progress Notes (Signed)
Virtual Visit via Telephone Note  I connected with  [name] on 02/06/21 at  8:00 AM EDT by telephone and verified that I am speaking with the correct person using two identifiers.  Medicare Annual Wellness visit completed telephonically due to Covid-19 pandemic.   Persons participating in this call: This Health Coach and this patient.   Location: Patient: Home Provider: Office    I discussed the limitations, risks, security and privacy concerns of performing an evaluation and management service by telephone and the availability of in person appointments. The patient expressed understanding and agreed to proceed.  Unable to perform video visit due to video visit attempted and failed and/or patient does not have video capability.   Some vital signs may be absent or patient reported.   Willette Brace, LPN   Subjective:   JEREMY DITULLIO is a 80 y.o. male who presents for Medicare Annual/Subsequent preventive examination.  Review of Systems     Cardiac Risk Factors include: advanced age (>60men, >37 women);dyslipidemia;male gender     Objective:    There were no vitals filed for this visit. There is no height or weight on file to calculate BMI.  Advanced Directives 02/06/2021 12/03/2019 07/10/2019 07/07/2019 09/06/2018 03/22/2018 03/10/2018  Does Patient Have a Medical Advance Directive? No Yes No No No No No  Type of Advance Directive - Healthcare Power of Pace;Living will - - - - -  Does patient want to make changes to medical advance directive? - No - Patient declined - - - - -  Copy of [place] in Chart? - No - copy requested - - - - -  Would patient like information on creating a medical advance directive? No - Patient declined - No - Patient declined No - Patient declined No - Patient declined No - Patient declined No - Patient declined  Pre-existing out of facility DNR order (yellow form or pink MOST form) - - - - - - -    Current  Medications (verified) Outpatient Encounter Medications as of 02/06/2021  Medication Sig   aspirin EC 81 MG tablet Take 1 tablet (81 mg total) by mouth 2 (two) times daily. For DVT prophylaxis (Patient taking differently: Take 81 mg by mouth daily. For DVT prophylaxis)   fluticasone (FLONASE) 50 MCG/ACT nasal spray Place 2 sprays into both nostrils daily. (Patient taking differently: Place 2 sprays into both nostrils as needed.)   ipratropium (ATROVENT) 0.06 % nasal spray Place 2 sprays into both nostrils 4 (four) times daily. (Patient taking differently: Place 2 sprays into both nostrils as needed.)   omeprazole (PRILOSEC) 40 MG capsule TAKE 1 CAPSULE DAILY   rosuvastatin (CRESTOR) 20 MG tablet TAKE 1 TABLET DAILY   tadalafil (CIALIS) 20 MG tablet TAKE ONE-HALF (1/2) TABLET DAILY AS NEEDED FOR ERECTILE DYSFUNCTION   traMADol (ULTRAM) 50 MG tablet Take 1-2 tablets (50-100 mg total) by mouth 2 (two) times daily as needed for severe pain.   No facility-administered encounter medications on file as of 02/06/2021.    Allergies (verified) Doxycycline and Lipitor [atorvastatin]   History: Past Medical History:  Diagnosis Date   Acute lower GI bleeding 01/25/2013; 09/2014   diverticular bleed   Acute prostatitis 02/21/2019   ANXIETY 08/22/2008   Arthritis    "a little bit" (04/14/2015)   Barrett's esophagus 2003   hematochezia as a result since 2003   Basal cell cancer    removed from right forearm.    Bradycardia 04/14/2015  CORONARY ARTERY DISEASE    2 stents   Depression    DIVERTICULOSIS, COLON 2003   ERECTILE DYSFUNCTION 08/22/2008   GERD (gastroesophageal reflux disease)    Heart murmur 04/14/2015   "they think I did; that's why I'm here" (04/14/2015)   History of kidney stones    HYPERLIPIDEMIA 02/09/2007   Kidney stones    "blasted or passed" (04/14/2015)   MI (myocardial infarction) ([facility]) 2000   Numbness    right side - history of    OBESITY 03/20/2010   Squamous cell cancer of  skin of earlobe    removed from area behind right earlobe   Stroke ([facility])    4 yrs ago possible mini stroke -  tests never found anything   Past Surgical History:  Procedure Laterality Date   BASAL CELL CARCINOMA EXCISION     posterior right ear.    CATARACT EXTRACTION, BILATERAL     COLONOSCOPY N/A 01/26/2013   Procedure: COLONOSCOPY;  Surgeon: Milus Banister, MD;  Location: [facility];  Service: Endoscopy;  Laterality: N/A;   CORONARY ANGIOPLASTY WITH STENT PLACEMENT  09/06/1998   LAD stent per Dr Elonda Husky in [place]. 2 stents.    CYSTOSCOPY W/ URETERAL STENT PLACEMENT Left 12/21/2016   Procedure: CYSTOSCOPY WITH RETROGRADE PYELOGRAM/URETERAL STENT PLACEMENT;  Surgeon: Alexis Frock, MD;  Location: [facility];  Service: Urology;  Laterality: Left;   ESOPHAGOGASTRODUODENOSCOPY (EGD) WITH ESOPHAGEAL DILATION     "q time I have an EGD; usually q 3-4 years" (04/14/2015)   EXTRACORPOREAL SHOCK WAVE LITHOTRIPSY Left 12/30/2016   Procedure: LEFT EXTRACORPOREAL SHOCK WAVE LITHOTRIPSY (ESWL);  Surgeon: Carolan Clines, MD;  Location: [facility];  Service: Urology;  Laterality: Left;   LITHOTRIPSY  X 2   SQUAMOUS CELL CARCINOMA EXCISION     right forearm   TONSILLECTOMY  1971   TOTAL KNEE ARTHROPLASTY Left 03/21/2018   TOTAL KNEE ARTHROPLASTY Left 03/21/2018   Procedure: LEFT TOTAL KNEE ARTHROPLASTY;  Surgeon: Renette Butters, MD;  Location: [facility];  Service: Orthopedics;  Laterality: Left;   UPPER GASTROINTESTINAL ENDOSCOPY     URETERAL STENT PLACEMENT     2019   Family History  Problem Relation Age of Onset   Alcohol abuse Father    Liver disease Father    Heart disease Brother        CABG   Colon cancer Neg Hx    Social History   Socioeconomic History   Marital status: Married    Spouse name: Not on file   Number of children: Not on file   Years of education: Not on file   Highest education level: Not on file  Occupational History   Not on file  Tobacco Use   Smoking status:  Former    Packs/day: 1.00    Years: 30.00    Pack years: 30.00    Types: Cigarettes   Smokeless tobacco: Never   Tobacco comments:    "quit smoking cigarettes in ~ 1990"  Vaping Use   Vaping Use: Never used  Substance and Sexual Activity   Alcohol use: Yes    Comment: 04/14/2015 "might drink a couple beers/month"   Drug use: No   Sexual activity: Never  Other Topics Concern   Not on file  Social History Narrative   Still working at [employer]   Married 35 years in 2015, 4 kids from first marriage, 3 grandkids   Hobbies: woodworking, tv movies- old action movies like [place]  Determinants of Health   Financial Resource Strain: Low Risk    Difficulty of Paying Living Expenses: Not hard at all  Food Insecurity: No Food Insecurity   Worried About [place] in the Last Year: Never true   Ran Out of Food in the Last Year: Never true  Transportation Needs: No Transportation Needs   Lack of Transportation (Medical): No   Lack of Transportation (Non-Medical): No  Physical Activity: Inactive   Days of Exercise per Week: 0 days   Minutes of Exercise per Session: 0 min  Stress: No Stress Concern Present   Feeling of Stress : Only a little  Social Connections: Moderately Isolated   Frequency of Communication with Friends and Family: More than three times a week   Frequency of Social Gatherings with Friends and Family: More than three times a week   Attends Religious Services: Never   Marine scientist or Organizations: No   Attends Music therapist: Never   Marital Status: Married    Tobacco Counseling Counseling given: Not Answered Tobacco comments: "quit smoking cigarettes in ~ 1990"   Clinical Intake:  Pre-visit preparation completed: Yes  Pain : No/denies pain     BMI - recorded: 29.05 Nutritional Status: BMI 25 -29 Overweight Nutritional Risks: None Diabetes: No  How often do you need to have someone help you when you  read instructions, pamphlets, or other written materials from your doctor or pharmacy?: 1 - Never  Diabetic?No  Interpreter Needed?: No  Information entered by :: Charlott Rakes, LPN   Activities of Daily Living In your present state of health, do you have any difficulty performing the following activities: 02/06/2021 12/10/2020  Hearing? Y N  Comment wears hearing aids -  Vision? N N  Difficulty concentrating or making decisions? N N  Walking or climbing stairs? N N  Dressing or bathing? N N  Doing errands, shopping? N N  Preparing Food and eating ? N -  Using the Toilet? N -  In the past six months, have you accidently leaked urine? N -  Do you have problems with loss of bowel control? N -  Managing your Medications? N -  Managing your Finances? N -  Housekeeping or managing your Housekeeping? N -  Some recent data might be hidden    Patient Care Team: Marin Olp, MD as PCP - General (Family Medicine) [facility], [facility] Ladene Artist, MD as Consulting Physician (Gastroenterology) Talbert Cage, NP as Nurse Practitioner (Cardiology)  Indicate any recent Medical Services you may have received from other than [facility] providers in the past year (date may be approximate).     Assessment:   This is a routine wellness examination for Rakeen.  Hearing/Vision screen No results found.  Dietary issues and exercise activities discussed: Current Exercise Habits: The patient has a physically strenuous job, but has no regular exercise apart from work. (works 3 days a week)   Goals Addressed             This Visit's Progress    Patient Stated       Continue to lose weight         Depression Screen PHQ 2/9 Scores 02/06/2021 12/10/2020 12/03/2019 08/02/2019 07/17/2019 06/04/2019 02/19/2019  PHQ - 2 Score 0 0 0 0 0 0 0  PHQ- 9 Score - 0 - - - - -    Fall Risk Fall Risk  02/06/2021 12/10/2020 12/03/2019 02/19/2019 12/19/2017  Falls in  the past year? 0 0  0 0 Yes  Number falls in past yr: 0 0 0 0 1  Injury with Fall? 0 0 0 0 Yes  Risk for fall due to : Impaired vision - - - Impaired balance/gait;Impaired mobility  Follow up Falls prevention discussed - Falls evaluation completed;Education provided;Falls prevention discussed Falls evaluation completed -    FALL RISK PREVENTION PERTAINING TO THE HOME:  Any stairs in or around the home? Yes  If so, are there any without handrails? No  Home free of loose throw rugs in walkways, pet beds, electrical cords, etc? Yes  Adequate lighting in your home to reduce risk of falls? Yes   ASSISTIVE DEVICES UTILIZED TO PREVENT FALLS:  Life alert? No  Use of a cane, walker or w/c? No  Grab bars in the bathroom? No  Shower chair or bench in shower? Yes  Elevated toilet seat or a handicapped toilet? No   TIMED UP AND GO:  Was the test performed? No     Cognitive Function:     6CIT Screen 02/06/2021 12/03/2019  What Year? 0 points 0 points  What month? 0 points 0 points  What time? 0 points 0 points  Count back from 20 0 points 0 points  Months in reverse 0 points 0 points  Repeat phrase 0 points 0 points  Total Score 0 0    Immunizations Immunization History  Administered Date(s) Administered   Fluad Quad(high Dose 65+) 06/04/2019   Influenza Split 08/23/2011   Influenza, High Dose Seasonal PF 08/03/2016, 09/05/2017, 06/20/2018, 08/29/2020   Influenza,inj,Quad PF,6+ Mos 08/07/2014   PFIZER(Purple Top)SARS-COV-2 Vaccination 10/21/2019, 11/13/2019   Pneumococcal Polysaccharide-23 08/22/2008   Td 08/31/2003    TDAP status: Due, Education has been provided regarding the importance of this vaccine. Advised may receive this vaccine at local pharmacy or Health Dept. Aware to provide a copy of the vaccination record if obtained from local pharmacy or Health Dept. Verbalized acceptance and understanding.  Flu Vaccine status: Up to date  Pneumococcal vaccine status: Due, Education has been  provided regarding the importance of this vaccine. Advised may receive this vaccine at local pharmacy or Health Dept. Aware to provide a copy of the vaccination record if obtained from local pharmacy or Health Dept. Verbalized acceptance and understanding.  Covid-19 vaccine status: Completed vaccines  Qualifies for Shingles Vaccine? Yes   Zostavax completed No   Shingrix Completed?: No.    Education has been provided regarding the importance of this vaccine. Patient has been advised to call insurance company to determine out of pocket expense if they have not yet received this vaccine. Advised may also receive vaccine at local pharmacy or Health Dept. Verbalized acceptance and understanding.  Screening Tests Health Maintenance  Topic Date Due   Zoster Vaccines- Shingrix (1 of 2) Never done   TETANUS/TDAP  08/30/2013   PNA vac Low Risk Adult (2 of 2 - PCV13) 06/03/2021 (Originally 08/22/2009)   INFLUENZA VACCINE  03/30/2021   Hepatitis C Screening  Completed   HPV VACCINES  Aged Out   COVID-19 Vaccine  Discontinued    Health Maintenance  Health Maintenance Due  Topic Date Due   Zoster Vaccines- Shingrix (1 of 2) Never done   TETANUS/TDAP  08/30/2013    Colorectal cancer screening: No longer required.    Additional Screening:  Hepatitis C Screening:  Completed 06/11/20  Vision Screening: Recommended annual ophthalmology exams for early detection of glaucoma and other disorders of the eye. Is  the patient up to date with their annual eye exam?  Yes  Who is the provider or what is the name of the office in which the patient attends annual eye exams? [facility]  If pt is not established with a provider, would they like to be referred to a provider to establish care? No .   Dental Screening: Recommended annual dental exams for proper oral hygiene  Community Resource Referral / Chronic Care Management: CRR required this visit?  No   CCM required this visit?  No      Plan:      I have personally reviewed and noted the following in the patient's chart:   Medical and social history Use of alcohol, tobacco or illicit drugs  Current medications and supplements including opioid prescriptions. Patient is currently taking opioid prescriptions. Information provided to patient regarding non-opioid alternatives. Patient advised to discuss non-opioid treatment plan with their provider. Functional ability and status Nutritional status Physical activity Advanced directives List of other physicians Hospitalizations, surgeries, and ER visits in previous 12 months Vitals Screenings to include cognitive, depression, and falls Referrals and appointments  In addition, I have reviewed and discussed with patient certain preventive protocols, quality metrics, and best practice recommendations. A written personalized care plan for preventive services as well as general preventive health recommendations were provided to patient.     Willette Brace, LPN   8/33/5825   Nurse Notes: None

## 2021-02-06 NOTE — Patient Instructions (Signed)
[name] , Thank you for taking time to come for your Medicare Wellness Visit. I appreciate your ongoing commitment to your health goals. Please review the following plan we discussed and let me know if I can assist you in the future.   Screening recommendations/referrals: Colonoscopy: No longer required last procedure  done 01/26/13 Recommended yearly ophthalmology/optometry visit for glaucoma screening and checkup Recommended yearly dental visit for hygiene and checkup  Vaccinations: Influenza vaccine: Up to date due 03/30/21 Pneumococcal vaccine: Due and discussed Tdap vaccine: Due and discussed Shingles vaccine: Shingrix discussed. Please contact your pharmacy for coverage information.    Covid-19: Completed 2/21 & 3/16  Advanced directives: Advance directive discussed with you today. Even though you declined this today please call our office should you change your mind and we can give you the proper paperwork for you to fill out.  Conditions/risks identified: Continue to lose weight   Next appointment: Follow up in one year for your annual wellness visit.   Preventive Care 1 Years and Older, Male Preventive care refers to lifestyle choices and visits with your health care provider that can promote health and wellness. What does preventive care include? A yearly physical exam. This is also called an annual well check. Dental exams once or twice a year. Routine eye exams. Ask your health care provider how often you should have your eyes checked. Personal lifestyle choices, including: Daily care of your teeth and gums. Regular physical activity. Eating a healthy diet. Avoiding tobacco and drug use. Limiting alcohol use. Practicing safe sex. Taking low doses of aspirin every day. Taking vitamin and mineral supplements as recommended by your health care provider. What happens during an annual well check? The services and screenings done by your health care provider during your  annual well check will depend on your age, overall health, lifestyle risk factors, and family history of disease. Counseling  Your health care provider may ask you questions about your: Alcohol use. Tobacco use. Drug use. Emotional well-being. Home and relationship well-being. Sexual activity. Eating habits. History of falls. Memory and ability to understand (cognition). Work and work Statistician. Screening  You may have the following tests or measurements: Height, weight, and BMI. Blood pressure. Lipid and cholesterol levels. These may be checked every 5 years, or more frequently if you are over 53 years old. Skin check. Lung cancer screening. You may have this screening every year starting at age 91 if you have a 30-pack-year history of smoking and currently smoke or have quit within the past 15 years. Fecal occult blood test (FOBT) of the stool. You may have this test every year starting at age 11. Flexible sigmoidoscopy or colonoscopy. You may have a sigmoidoscopy every 5 years or a colonoscopy every 10 years starting at age 64. Prostate cancer screening. Recommendations will vary depending on your family history and other risks. Hepatitis C blood test. Hepatitis B blood test. Sexually transmitted disease (STD) testing. Diabetes screening. This is done by checking your blood sugar (glucose) after you have not eaten for a while (fasting). You may have this done every 1-3 years. Abdominal aortic aneurysm (AAA) screening. You may need this if you are a current or former smoker. Osteoporosis. You may be screened starting at age 65 if you are at high risk. Talk with your health care provider about your test results, treatment options, and if necessary, the need for more tests. Vaccines  Your health care provider may recommend certain vaccines, such as: Influenza vaccine. This is  recommended every year. Tetanus, diphtheria, and acellular pertussis (Tdap, Td) vaccine. You may need a Td  booster every 10 years. Zoster vaccine. You may need this after age 38. Pneumococcal 13-valent conjugate (PCV13) vaccine. One dose is recommended after age 74. Pneumococcal polysaccharide (PPSV23) vaccine. One dose is recommended after age 39. Talk to your health care provider about which screenings and vaccines you need and how often you need them. This information is not intended to replace advice given to you by your health care provider. Make sure you discuss any questions you have with your health care provider. Document Released: 09/12/2015 Document Revised: 05/05/2016 Document Reviewed: 06/17/2015 Elsevier Interactive Patient Education  2017 [place] Prevention in the Home Falls can cause injuries. They can happen to people of all ages. There are many things you can do to make your home safe and to help prevent falls. What can I do on the outside of my home? Regularly fix the edges of walkways and driveways and fix any cracks. Remove anything that might make you trip as you walk through a door, such as a raised step or threshold. Trim any bushes or trees on the path to your home. Use bright outdoor lighting. Clear any walking paths of anything that might make someone trip, such as rocks or tools. Regularly check to see if handrails are loose or broken. Make sure that both sides of any steps have handrails. Any raised decks and porches should have guardrails on the edges. Have any leaves, snow, or ice cleared regularly. Use sand or salt on walking paths during winter. Clean up any spills in your garage right away. This includes oil or grease spills. What can I do in the bathroom? Use night lights. Install grab bars by the toilet and in the tub and shower. Do not use towel bars as grab bars. Use non-skid mats or decals in the tub or shower. If you need to sit down in the shower, use a plastic, non-slip stool. Keep the floor dry. Clean up any water that spills on the floor  as soon as it happens. Remove soap buildup in the tub or shower regularly. Attach bath mats securely with double-sided non-slip rug tape. Do not have throw rugs and other things on the floor that can make you trip. What can I do in the bedroom? Use night lights. Make sure that you have a light by your bed that is easy to reach. Do not use any sheets or blankets that are too big for your bed. They should not hang down onto the floor. Have a firm chair that has side arms. You can use this for support while you get dressed. Do not have throw rugs and other things on the floor that can make you trip. What can I do in the kitchen? Clean up any spills right away. Avoid walking on wet floors. Keep items that you use a lot in easy-to-reach places. If you need to reach something above you, use a strong step stool that has a grab bar. Keep electrical cords out of the way. Do not use floor polish or wax that makes floors slippery. If you must use wax, use non-skid floor wax. Do not have throw rugs and other things on the floor that can make you trip. What can I do with my stairs? Do not leave any items on the stairs. Make sure that there are handrails on both sides of the stairs and use them. Fix handrails that are  broken or loose. Make sure that handrails are as long as the stairways. Check any carpeting to make sure that it is firmly attached to the stairs. Fix any carpet that is loose or worn. Avoid having throw rugs at the top or bottom of the stairs. If you do have throw rugs, attach them to the floor with carpet tape. Make sure that you have a light switch at the top of the stairs and the bottom of the stairs. If you do not have them, ask someone to add them for you. What else can I do to help prevent falls? Wear shoes that: Do not have high heels. Have rubber bottoms. Are comfortable and fit you well. Are closed at the toe. Do not wear sandals. If you use a stepladder: Make sure that it is  fully opened. Do not climb a closed stepladder. Make sure that both sides of the stepladder are locked into place. Ask someone to hold it for you, if possible. Clearly mark and make sure that you can see: Any grab bars or handrails. First and last steps. Where the edge of each step is. Use tools that help you move around (mobility aids) if they are needed. These include: Canes. Walkers. Scooters. Crutches. Turn on the lights when you go into a dark area. Replace any light bulbs as soon as they burn out. Set up your furniture so you have a clear path. Avoid moving your furniture around. If any of your floors are uneven, fix them. If there are any pets around you, be aware of where they are. Review your medicines with your doctor. Some medicines can make you feel dizzy. This can increase your chance of falling. Ask your doctor what other things that you can do to help prevent falls. This information is not intended to replace advice given to you by your health care provider. Make sure you discuss any questions you have with your health care provider. Document Released: 06/12/2009 Document Revised: 01/22/2016 Document Reviewed: 09/20/2014 Elsevier Interactive Patient Education  2017 [employer].

## 2021-02-09 ENCOUNTER — Encounter: Payer: Self-pay | Admitting: Gastroenterology

## 2021-02-11 ENCOUNTER — Other Ambulatory Visit: Payer: Self-pay | Admitting: Gastroenterology

## 2021-03-05 ENCOUNTER — Ambulatory Visit (AMBULATORY_SURGERY_CENTER): Payer: Medicare Other | Admitting: *Deleted

## 2021-03-05 ENCOUNTER — Other Ambulatory Visit: Payer: Self-pay

## 2021-03-05 VITALS — Ht 66.0 in | Wt 182.0 lb

## 2021-03-05 DIAGNOSIS — K2270 Barrett's esophagus without dysplasia: Secondary | ICD-10-CM

## 2021-03-05 NOTE — Progress Notes (Signed)

## 2021-03-19 ENCOUNTER — Other Ambulatory Visit: Payer: Self-pay

## 2021-03-19 ENCOUNTER — Encounter: Payer: Self-pay | Admitting: Gastroenterology

## 2021-03-19 ENCOUNTER — Ambulatory Visit (AMBULATORY_SURGERY_CENTER): Payer: Medicare Other | Admitting: Gastroenterology

## 2021-03-19 VITALS — BP 115/65 | HR 44 | Temp 98.2°F | Resp 15 | Ht 66.0 in | Wt 182.0 lb

## 2021-03-19 DIAGNOSIS — K2270 Barrett's esophagus without dysplasia: Secondary | ICD-10-CM

## 2021-03-19 DIAGNOSIS — K449 Diaphragmatic hernia without obstruction or gangrene: Secondary | ICD-10-CM | POA: Diagnosis not present

## 2021-03-19 DIAGNOSIS — R1310 Dysphagia, unspecified: Secondary | ICD-10-CM | POA: Diagnosis not present

## 2021-03-19 MED ORDER — OMEPRAZOLE 40 MG PO CPDR: 40.0000 mg | DELAYED_RELEASE_CAPSULE | Freq: Every day | ORAL | 3 refills | Status: DC

## 2021-03-19 MED ORDER — SODIUM CHLORIDE 0.9 % IV SOLN
500.0000 mL | Freq: Once | INTRAVENOUS | Status: DC
Start: 2021-03-19 — End: 2021-03-19

## 2021-03-19 NOTE — Patient Instructions (Signed)
Clear liquids for two hours until 1:00 pm, soft foods for the rest of today. Tomorrow you may resume normal diet.     YOU HAD AN ENDOSCOPIC PROCEDURE TODAY AT [facility]:   Refer to the procedure report that was given to you for any specific questions about what was found during the examination.  If the procedure report does not answer your questions, please call your gastroenterologist to clarify.  If you requested that your care partner not be given the details of your procedure findings, then the procedure report has been included in a sealed envelope for you to review at your convenience later.  YOU SHOULD EXPECT: Some feelings of bloating in the abdomen. Passage of more gas than usual.  Walking can help get rid of the air that was put into your GI tract during the procedure and reduce the bloating. If you had a lower endoscopy (such as a colonoscopy or flexible sigmoidoscopy) you may notice spotting of blood in your stool or on the toilet paper. If you underwent a bowel prep for your procedure, you may not have a normal bowel movement for a few days.  Please Note:  You might notice some irritation and congestion in your nose or some drainage.  This is from the oxygen used during your procedure.  There is no need for concern and it should clear up in a day or so.  SYMPTOMS TO REPORT IMMEDIATELY:   Following upper endoscopy (EGD)  Vomiting of blood or coffee ground material  New chest pain or pain under the shoulder blades  Painful or persistently difficult swallowing  New shortness of breath  Fever of 100F or higher  Black, tarry-looking stools  For urgent or emergent issues, a gastroenterologist can be reached at any hour by calling [phone]. Do not use MyChart messaging for urgent concerns.    DIET:  Clear liquids for 2 hours until 1:00 pm, soft foods for the rest of today. Tomorrow you may proceed to your regular diet.  Drink plenty of fluids but you should  avoid alcoholic beverages for 24 hours.  ACTIVITY:  You should plan to take it easy for the rest of today and you should NOT DRIVE or use heavy machinery until tomorrow (because of the sedation medicines used during the test).    FOLLOW UP: Our staff will call the number listed on your records 48-72 hours following your procedure to check on you and address any questions or concerns that you may have regarding the information given to you following your procedure. If we do not reach you, we will leave a message.  We will attempt to reach you two times.  During this call, we will ask if you have developed any symptoms of COVID 19. If you develop any symptoms (ie: fever, flu-like symptoms, shortness of breath, cough etc.) before then, please call [phone].  If you test positive for Covid 19 in the 2 weeks post procedure, please call and report this information to [place].    If any biopsies were taken you will be contacted by phone or by letter within the next 1-3 weeks.  Please call us at [phone] if you have not heard about the biopsies in 3 weeks.    SIGNATURES/CONFIDENTIALITY: You and/or your care partner have signed paperwork which will be entered into your electronic medical record.  These signatures attest to the fact that that the information above on your After Visit Summary has been reviewed and is  understood.  Full responsibility of the confidentiality of this discharge information lies with you and/or your care-partner.

## 2021-03-19 NOTE — Progress Notes (Signed)
Called to room to assist during endoscopic procedure.  Patient ID and intended procedure confirmed with present staff. Received instructions for my participation in the procedure from the performing physician.  

## 2021-03-19 NOTE — Progress Notes (Signed)
pt tolerated well. VSS. awake and to recovery. Report given to RN.  Bite block removed without trauma.

## 2021-03-19 NOTE — Progress Notes (Signed)
Pt's states no medical or surgical changes since previsit or office visit.   Patients HR is 44 this am. He has been taken off of his atenolol in the past per Cardiology. Not symptomatic.   Cw vitals and KG IV.

## 2021-03-19 NOTE — Op Note (Signed)
[facility] Patient Name: Jose [name] Procedure Date: 03/19/2021 10:28 AM MRN: [id number] Endoscopist: Ladene Artist , MD Age: 80 Referring MD:  Date of Birth: 1940/10/17 Gender: Male Account #: [id number] Procedure:                Upper GI endoscopy Indications:              Surveillance for malignancy due to personal history                            of Barrett's esophagus, Dysphagia Medicines:                Monitored Anesthesia Care Procedure:                Pre-Anesthesia Assessment:                           - Prior to the procedure, a History and Physical                            was performed, and patient medications and                            allergies were reviewed. The patient's tolerance of                            previous anesthesia was also reviewed. The risks                            and benefits of the procedure and the sedation                            options and risks were discussed with the patient.                            All questions were answered, and informed consent                            was obtained. Prior Anticoagulants: The patient has                            taken no previous anticoagulant or antiplatelet                            agents. ASA Grade Assessment: III - A patient with                            severe systemic disease. After reviewing the risks                            and benefits, the patient was deemed in                            satisfactory condition to undergo the procedure.  After obtaining informed consent, the endoscope was                            passed under direct vision. Throughout the                            procedure, the patient's blood pressure, pulse, and                            oxygen saturations were monitored continuously. The                            Endoscope was introduced through the mouth, and                            advanced to the  second part of duodenum. The upper                            GI endoscopy was accomplished without difficulty.                            The patient tolerated the procedure well. Scope In: Scope Out: Findings:                 There were esophageal mucosal changes secondary to                            established long-segment Barrett's disease present                            in the distal esophagus. The maximum longitudinal                            extent of these mucosal changes was 4 cm in length.                            Mucosa was biopsied with a cold forceps for                            histology in 4 quadrants at intervals of 1 cm in                            the lower third of the esophagus. One specimen                            bottle was sent to pathology.                           No endoscopic abnormality was evident in the                            esophagus to explain the patient's complaint of  dysphagia. It was decided, however, to proceed with                            dilation of the entire esophagus. A guidewire was                            placed and the scope was withdrawn. Dilation was                            performed with a Savary dilator with no resistance                            at 17 mm.                           The exam of the esophagus was otherwise normal.                           A small hiatal hernia was present.                           The exam of the stomach was otherwise normal.                           The duodenal bulb and second portion of the                            duodenum were normal. Complications:            No immediate complications. Estimated Blood Loss:     Estimated blood loss was minimal. Impression:               - Esophageal mucosal changes secondary to                            established long-segment Barrett's disease.                            Biopsied.                            - No endoscopic esophageal abnormality to explain                            patient's dysphagia. Dilated.                           - Small hiatal hernia.                           - Normal duodenal bulb and second portion of the                            duodenum. Recommendation:           - Patient has a contact number available for  emergencies. The signs and symptoms of potential                            delayed complications were discussed with the                            patient. Return to normal activities tomorrow.                            Written discharge instructions were provided to the                            patient.                           - Clear liquid diet for 2 hours, then advance as                            tolerated to soft diet today.                           - Resume prior diet tomorrow.                           - Follow antireflux measures long term.                           - Continue present medications.                           - Await pathology results.                           - Prilosec (omeprazole) 40 mg PO daily, 1 year of                            refills.                           - Repeat upper endoscopy in 3 years for                            surveillance based on pathology results. Ladene Artist, MD 03/19/2021 10:48:31 AM This report has been signed electronically.

## 2021-03-23 ENCOUNTER — Telehealth: Payer: Self-pay | Admitting: *Deleted

## 2021-03-23 NOTE — Telephone Encounter (Signed)
  Follow up Call-  Call back number 03/19/2021  Post procedure Call Back phone  # [phone]  Permission to leave phone message Yes  Some recent data might be hidden     Patient questions:  Do you have a fever, pain , or abdominal swelling? No. Pain Score  0 *  Have you tolerated food without any problems? Yes.    Have you been able to return to your normal activities? Yes.    Do you have any questions about your discharge instructions: Diet   No. Medications  No. Follow up visit  No.  Do you have questions or concerns about your Care? No.  Actions: * If pain score is 4 or above: No action needed, pain <4.  Have you developed a fever since your procedure? no  2.   Have you had an respiratory symptoms (SOB or cough) since your procedure? no  3.   Have you tested positive for COVID 19 since your procedure no  4.   Have you had any family members/close contacts diagnosed with the COVID 19 since your procedure?  no   If yes to any of these questions please route to Joylene John, RN and Joella Prince, RN

## 2021-03-31 ENCOUNTER — Encounter: Payer: Self-pay | Admitting: Gastroenterology

## 2021-04-19 ENCOUNTER — Ambulatory Visit
Admission: EM | Admit: 2021-04-19 | Discharge: 2021-04-19 | Disposition: A | Discharge status: Home / Self Care | Payer: Medicare Other | Attending: Urgent Care | Admitting: Urgent Care

## 2021-04-19 DIAGNOSIS — R197 Diarrhea, unspecified: Secondary | ICD-10-CM

## 2021-04-19 DIAGNOSIS — A084 Viral intestinal infection, unspecified: Secondary | ICD-10-CM | POA: Diagnosis not present

## 2021-04-19 DIAGNOSIS — R195 Other fecal abnormalities: Secondary | ICD-10-CM

## 2021-04-19 DIAGNOSIS — R110 Nausea: Secondary | ICD-10-CM

## 2021-04-19 DIAGNOSIS — K5790 Diverticulosis of intestine, part unspecified, without perforation or abscess without bleeding: Secondary | ICD-10-CM

## 2021-04-19 MED ORDER — ONDANSETRON 8 MG PO TBDP: 8.0000 mg | ORAL_TABLET | Freq: Three times a day (TID) | ORAL | 0 refills | Status: DC | PRN

## 2021-04-19 MED ORDER — LOPERAMIDE HCL 2 MG PO CAPS: 2.0000 mg | ORAL_CAPSULE | Freq: Two times a day (BID) | ORAL | 0 refills | Status: DC | PRN

## 2021-04-19 NOTE — Discharge Instructions (Addendum)
Make sure you push fluids drinking mostly water but mix it with Gatorade.  Try to eat light meals including soups, broths and soft foods, fruits.  You may use Zofran for your nausea and vomiting once every 8 hours.  Imodium can help with diarrhea but use this carefully limiting it to 1-2 times per day only if you are having a lot of diarrhea.  Please return to the clinic if symptoms worsen or you start having severe abdominal pain not helped by taking Tylenol or start having bloody stools or blood in the vomit.  If you continue to have a lot of diarrhea, dark stools or develop chest pain, difficulty breathing, racing heart beat, frank red blood in your stool, then go to the emergency room.

## 2021-04-19 NOTE — ED Triage Notes (Signed)
Pt present diarrhea and nausea with abdomen pain. Symptom started last night. Pt states last night he took OTC medication relief.

## 2021-04-19 NOTE — ED Provider Notes (Signed)
[facility]   MRN: [id number] DOB: 30-Mar-1941  Subjective:   Jose [name] is a 80 y.o. male presenting for 1 day history of acute onset nausea, diarrhea, lower abdominal pain.  Patient states that he has not had any vomiting but is having a hard time with nausea.  He has had many profuse watery bouts of diarrhea.  Has had some small dark stools yesterday and today.  Reports that they have been mostly brown and watery however.  Denies any recent antibiotic use, recent hospitalizations, long distance travel.  He does have a history of diverticulosis but to his knowledge he has not seen any frank red bloody stools.  No chest pain, shortness of breath, heart racing, diaphoresis.  Patient does have a history of GI bleeding, acute prostatitis.  No pelvic pain.  No current facility-administered medications for this encounter.  Current Outpatient Medications:    loperamide (IMODIUM) 2 MG capsule, Take 1 capsule (2 mg total) by mouth 2 (two) times daily as needed for diarrhea or loose stools., Disp: 14 capsule, Rfl: 0   ondansetron (ZOFRAN-ODT) 8 MG disintegrating tablet, Take 1 tablet (8 mg total) by mouth every 8 (eight) hours as needed for nausea or vomiting., Disp: 20 tablet, Rfl: 0   aspirin EC 81 MG tablet, Take 1 tablet (81 mg total) by mouth 2 (two) times daily. For DVT prophylaxis (Patient taking differently: Take 81 mg by mouth daily. For DVT prophylaxis), Disp: 60 tablet, Rfl: 0   fluticasone (FLONASE) 50 MCG/ACT nasal spray, Place 2 sprays into both nostrils daily. (Patient taking differently: Place 2 sprays into both nostrils as needed.), Disp: 1 g, Rfl: 0   ipratropium (ATROVENT) 0.06 % nasal spray, Place 2 sprays into both nostrils 4 (four) times daily. (Patient taking differently: Place 2 sprays into both nostrils as needed.), Disp: 15 mL, Rfl: 0   omeprazole (PRILOSEC) 40 MG capsule, TAKE 1 CAPSULE DAILY, Disp: 90 capsule, Rfl: 0   omeprazole (PRILOSEC) 40 MG capsule,  Take 1 capsule (40 mg total) by mouth daily., Disp: 90 capsule, Rfl: 3   rosuvastatin (CRESTOR) 20 MG tablet, TAKE 1 TABLET DAILY, Disp: 90 tablet, Rfl: 3   tadalafil (CIALIS) 20 MG tablet, TAKE ONE-HALF (1/2) TABLET DAILY AS NEEDED FOR ERECTILE DYSFUNCTION, Disp: 8 tablet, Rfl: 17   traMADol (ULTRAM) 50 MG tablet, Take 1-2 tablets (50-100 mg total) by mouth 2 (two) times daily as needed for severe pain., Disp: 60 tablet, Rfl: 5   Allergies  Allergen Reactions   Doxycycline     Vomiting but may have been due to COVID-19   Lipitor [Atorvastatin]     REACTION: muscle weakness    Past Medical History:  Diagnosis Date   Acute lower GI bleeding 01/25/2013; 09/2014   diverticular bleed   Acute prostatitis 02/21/2019   ANXIETY 08/22/2008   Arthritis    "a little bit" (04/14/2015)   Barrett's esophagus 2003   hematochezia as a result since 2003   Basal cell cancer    removed from right forearm.    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    CATARACT EXTRACTION, BILATERAL     COLONOSCOPY N/A 01/26/2013   Procedure: COLONOSCOPY;  Surgeon: Milus Banister, MD;  Location: [facility];  Service: Endoscopy;  Laterality: N/A;   CORONARY ANGIOPLASTY WITH STENT PLACEMENT  09/06/1998   LAD stent per Dr  Elonda Husky in [place]. 2 stents.    CYSTOSCOPY W/ URETERAL STENT PLACEMENT Left 12/21/2016   Procedure: CYSTOSCOPY WITH RETROGRADE PYELOGRAM/URETERAL STENT PLACEMENT;  Surgeon: Alexis Frock, MD;  Location: [facility];  Service: Urology;  Laterality: Left;   ESOPHAGOGASTRODUODENOSCOPY (EGD) WITH ESOPHAGEAL DILATION     "q time I have an EGD; usually q 3-4 years" (04/14/2015)   EXTRACORPOREAL SHOCK WAVE LITHOTRIPSY Left 12/30/2016   Procedure: LEFT EXTRACORPOREAL SHOCK WAVE LITHOTRIPSY (ESWL);  Surgeon: Carolan Clines, MD;  Location: [facility];  Service: Urology;  Laterality: Left;   LITHOTRIPSY  X 2   SQUAMOUS CELL CARCINOMA EXCISION     right forearm   TONSILLECTOMY  1971   TOTAL KNEE ARTHROPLASTY Left 03/21/2018   TOTAL KNEE ARTHROPLASTY Left 03/21/2018   Procedure: LEFT TOTAL KNEE ARTHROPLASTY;  Surgeon: Renette Butters, MD;  Location: [facility];  Service: Orthopedics;  Laterality: Left;   UPPER GASTROINTESTINAL ENDOSCOPY     URETERAL STENT PLACEMENT     2019    Family History  Problem Relation Age of Onset   Alcohol abuse Father    Liver disease Father    Heart disease Brother        CABG   Colon cancer Neg Hx     Social History   Tobacco Use   Smoking status: Former    Packs/day: 1.00    Years: 30.00    Pack years: 30.00    Types: Cigarettes   Smokeless tobacco: Never   Tobacco comments:    "quit smoking cigarettes in ~ 1990"  Vaping Use   Vaping Use: Never used  Substance Use Topics   Alcohol use: Yes    Comment: Beer-monthly per pt   Drug use: No    ROS   Objective:   Vitals: BP 131/86 (BP Location: Left Arm)   Pulse 66   Temp 97.6 F (36.4 C) (Oral)   Resp 16   SpO2 96%   Physical Exam Constitutional:      General: He is not in acute distress.    Appearance: Normal appearance. He is well-developed. He is not ill-appearing, toxic-appearing or diaphoretic.  HENT:     Head: Normocephalic and atraumatic.     Right Ear: External ear normal.     Left Ear:  External ear normal.     Nose: Nose normal.     Mouth/Throat:     Mouth: Mucous membranes are moist.     Pharynx: Oropharynx is clear.  Eyes:     General: No scleral icterus.    Extraocular Movements: Extraocular movements intact.     Pupils: Pupils are equal, round, and reactive to light.  Cardiovascular:     Rate and Rhythm: Normal rate and regular rhythm.  Heart sounds: Normal heart sounds. No murmur heard.   No friction rub. No gallop.  Pulmonary:     Effort: Pulmonary effort is normal. No respiratory distress.     Breath sounds: Normal breath sounds. No stridor. No wheezing, rhonchi or rales.  Abdominal:     General: Bowel sounds are increased. There is no distension.     Palpations: Abdomen is soft. There is no mass.     Tenderness: There is no abdominal tenderness. There is no guarding or rebound.  Skin:    General: Skin is warm and dry.  Neurological:     Mental Status: He is alert and oriented to person, place, and time.  Psychiatric:        Mood and Affect: Mood normal.        Behavior: Behavior normal.        Thought Content: Thought content normal.     Assessment and Plan :   PDMP not reviewed this encounter.  1. Viral colitis   2. Nausea without vomiting   3. Diarrhea, unspecified type   4. Diverticulosis   5. Dark stools     I do not suspect the patient has an acute abdominal problem such as acute diverticulitis, acute GI bleed.  Vital signs hemodynamically stable, physical exam findings reassuring.  Will manage for suspected viral colitis with supportive care.  Recommended patient hydrate well, eat light meals and maintain electrolytes.  Will use Zofran and Imodium for nausea, vomiting and diarrhea.  Emphasized need to maintain strict ER precautions and follow-up with his GI doctor soon as possible.  Counseled patient on potential for adverse effects with medications prescribed/recommended today, ER and return-to-clinic precautions discussed, patient  verbalized understanding.    Jaynee Eagles, [place] 04/19/21 1531

## 2021-05-27 ENCOUNTER — Other Ambulatory Visit: Payer: Self-pay | Admitting: Family Medicine

## 2021-06-11 ENCOUNTER — Ambulatory Visit (INDEPENDENT_AMBULATORY_CARE_PROVIDER_SITE_OTHER): Payer: Medicare Other | Admitting: Family Medicine

## 2021-06-11 ENCOUNTER — Other Ambulatory Visit: Payer: Self-pay

## 2021-06-11 ENCOUNTER — Encounter: Payer: Self-pay | Admitting: Family Medicine

## 2021-06-11 VITALS — BP 114/74 | HR 51 | Temp 99.1°F | Ht 66.0 in | Wt 185.8 lb

## 2021-06-11 DIAGNOSIS — R739 Hyperglycemia, unspecified: Secondary | ICD-10-CM

## 2021-06-11 DIAGNOSIS — E785 Hyperlipidemia, unspecified: Secondary | ICD-10-CM | POA: Diagnosis not present

## 2021-06-11 DIAGNOSIS — K760 Fatty (change of) liver, not elsewhere classified: Secondary | ICD-10-CM | POA: Diagnosis not present

## 2021-06-11 DIAGNOSIS — I2510 Atherosclerotic heart disease of native coronary artery without angina pectoris: Secondary | ICD-10-CM

## 2021-06-11 DIAGNOSIS — I714 Abdominal aortic aneurysm, without rupture, unspecified: Secondary | ICD-10-CM

## 2021-06-11 DIAGNOSIS — K2270 Barrett's esophagus without dysplasia: Secondary | ICD-10-CM | POA: Diagnosis not present

## 2021-06-11 DIAGNOSIS — R972 Elevated prostate specific antigen [PSA]: Secondary | ICD-10-CM

## 2021-06-11 DIAGNOSIS — E538 Deficiency of other specified B group vitamins: Secondary | ICD-10-CM | POA: Diagnosis not present

## 2021-06-11 MED ORDER — TRAMADOL HCL 50 MG PO TABS: 50.0000 mg | ORAL_TABLET | Freq: Two times a day (BID) | ORAL | 5 refills | Status: DC | PRN

## 2021-06-11 NOTE — Patient Instructions (Addendum)
Please stop by lab before you go If you have mychart- we will send your results within 3 business days of [place] receiving them.  If you do not have mychart- we will call you about results within 5 business days of [place] receiving them.  *please also note that you will see labs on mychart as soon as they post. I will later go in and write notes on them- will say "notes from Dr. Yong Channel"  I am glad to see that your weight is down by 3 lbs from your last visit - keep up the good work!  We will update your aneurysm screening next year.  Recommended follow up: Return in about 6 months (around 12/10/2021) for a follow-up or sooner if needed.

## 2021-06-11 NOTE — Progress Notes (Signed)
Phone [phone] In person visit   Subjective:   Jose [name] is a 80 y.o. year old very pleasant male patient who presents for/with See problem oriented charting Chief Complaint  Patient presents with   Hyperglycemia   This visit occurred during the SARS-CoV-2 public health emergency.  Safety protocols were in place, including screening questions prior to the visit, additional usage of staff PPE, and extensive cleaning of exam room while observing appropriate contact time as indicated for disinfecting solutions.   Past Medical History-  Patient Active Problem List   Diagnosis Date Noted   History of COVID-19.  Hospitalization November 2020 with acute respiratory failure. 07/09/2019    Priority: 1.   Aortic aneurysm ([place]) 06/04/2019    Priority: 1.   Atherosclerosis of coronary artery 02/09/2007    Priority: 1.   Barrett's esophagus 02/09/2007    Priority: 1.   Vitamin B12 deficiency 12/17/2020    Priority: 2.   Fatty liver 06/04/2019    Priority: 2.   GERD (gastroesophageal reflux disease) 10/23/2018    Priority: 2.   Primary osteoarthritis of left knee 02/27/2018    Priority: 2.   Elevated PSA 08/03/2016    Priority: 2.   Benign prostatic hypertrophy (BPH) with nocturia 05/27/2016    Priority: 2.   Hyperglycemia 02/26/2013    Priority: 2.   Hyperlipidemia 02/09/2007    Priority: 2.   Aortic atherosclerosis ([place]) 06/04/2019    Priority: 3.   Acute prostatitis 02/21/2019    Priority: 3.   Nasal congestion 10/23/2018    Priority: 3.   Lumbosacral radiculopathy 06/17/2015    Priority: 3.   Numbness 06/17/2015    Priority: 3.   Right leg numbness     Priority: 3.   Sinus bradycardia 03/19/2015    Priority: 3.   Diverticulosis of colon with hemorrhage     Priority: 3.   Actinic keratosis 08/07/2014    Priority: 3.   Elevated blood pressure 03/29/2014    Priority: 3.   OBESITY 03/20/2010    Priority: 3.   Generalized anxiety disorder 08/22/2008     Priority: 3.   ERECTILE DYSFUNCTION 08/22/2008    Priority: 3.   Diverticulosis of large intestine 02/09/2007    Priority: 3.   Gastrointestinal hemorrhage 10/15/2014    Medications- reviewed and updated Current Outpatient Medications  Medication Sig Dispense Refill   aspirin EC 81 MG tablet Take 1 tablet (81 mg total) by mouth 2 (two) times daily. For DVT prophylaxis (Patient taking differently: Take 81 mg by mouth daily. For DVT prophylaxis) 60 tablet 0   fluticasone (FLONASE) 50 MCG/ACT nasal spray Place 2 sprays into both nostrils daily. (Patient taking differently: Place 2 sprays into both nostrils as needed.) 1 g 0   ipratropium (ATROVENT) 0.06 % nasal spray Place 2 sprays into both nostrils 4 (four) times daily. (Patient taking differently: Place 2 sprays into both nostrils as needed.) 15 mL 0   loperamide (IMODIUM) 2 MG capsule Take 1 capsule (2 mg total) by mouth 2 (two) times daily as needed for diarrhea or loose stools. 14 capsule 0   omeprazole (PRILOSEC) 40 MG capsule Take 1 capsule (40 mg total) by mouth daily. 90 capsule 3   ondansetron (ZOFRAN-ODT) 8 MG disintegrating tablet Take 1 tablet (8 mg total) by mouth every 8 (eight) hours as needed for nausea or vomiting. 20 tablet 0   rosuvastatin (CRESTOR) 20 MG tablet TAKE 1 TABLET DAILY 90 tablet 3   tadalafil (CIALIS)  20 MG tablet TAKE ONE-HALF (1/2) TABLET DAILY AS NEEDED FOR ERECTILE DYSFUNCTION 8 tablet 17   traMADol (ULTRAM) 50 MG tablet Take 1-2 tablets (50-100 mg total) by mouth 2 (two) times daily as needed for severe pain (do not drive 8 hours after taking). 60 tablet 5   No current facility-administered medications for this visit.     Objective:  BP 114/74   Pulse (!) 51   Temp 99.1 F (37.3 C) (Temporal)   Ht 5\' 6"  (1.676 m)   Wt 185 lb 12.8 oz (84.3 kg)   SpO2 95%   BMI 29.99 kg/m  Gen: NAD, resting comfortably CV: RRR no murmurs rubs or gallops Lungs: CTAB no crackles, wheeze, rhonchi Abdomen:  soft/nontender/nondistended/normal bowel sounds. No rebound or guarding.  Ext: no edema Skin: warm, dry     Assessment and Plan  #Hyperglycemia S: Diet controlled in the past- down from 222 to 185 within 18 months- really great job overall. 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 Has loosened up some on the diet.  Lab Results  Component Value Date   HGBA1C 5.5 06/11/2020   HGBA1C 5.7 12/03/2019   HGBA1C 5.8 02/19/2019  A/P: hopefully a1c stable or improved- update a1c with labs  #CAD-followed in [facility]- remains on aspirin #Hyperlipidemia  S: Well controlled on rosuvastatin 20 mg. Ideal LDL under 70 No chest pain or shortness of breath Lab Results  Component Value Date   CHOL 110 12/17/2020   HDL 41.50 12/17/2020   LDLCALC 59 12/17/2020   LDLDIRECT 100.0 12/19/2017   TRIG 46.0 12/17/2020   CHOLHDL 3 12/17/2020  A/P: lipids. Controlled. Continue current medications.   Cad asymptomatic- continue current meds  #History of left knee replacement-with continued pain in the left knee despite this S: Patient using 1 tramadol in the morning and 1 in the evening for his knees. We received a letter from [facility] in 2020 requesting that we begin his long-term tramadol prescription. A/P:overall stable- still deals with pain- will refill tramadol to help him tolerate this- reviewed [facility] and overall low risk trend.    #GERD/Barrett's esophagus #low B12 S: doing well on omeprazole 40mg  per Dr. Fuller Plan. Only issue he gets is some phlegm clearing- discussed could also be allergy related.  EGD last done July 2022 Lab Results  Component Value Date   VITAMINB12 138 (L) 12/17/2020  A/P: GERD reasonably well-controlled.  Barrett's appears stable on endoscopy 03/19/2021. 3 year repeat planned  For low B12 patient maintains B12 most days of the week 1000 mcg-we will update B12 level with labs today-if this does not adequately control his B12 may need to go on  monthly injections  # Fatty liver S: Noted on CT 04/20/2019.  A/P: likely improving with weight loss- will update LFTs today   # Aortic aneurysm S: 3 cm aneurysm noted on ct 04/20/2019- 3 year repeat US planned.  A/P: due next year for repeat- update next year   #Elevated PSA-following up with urology yearly at least. Was around 5.5 in august and apparently came down November 2020 around 4 - planned yearly follow up -sees Dr. Louis Meckel soon for PSA repeat- reassuring results on recent checks Lab Results  Component Value Date   PSA 2.93 03/16/2018   PSA 4.25 (H) 12/19/2017   PSA 3.07 07/28/2016   Recommended follow up: Return in about 6 months (around 12/10/2021) for a follow-up or sooner if needed. Future Appointments  Date Time Provider [place]  02/11/2022  8:00 AM [facility]    Lab/Order associations:   ICD-10-CM   1. Hyperlipidemia, unspecified hyperlipidemia type  E78.5 CBC with Differential/Platelet    Comprehensive metabolic panel    2. Hyperglycemia  R73.9 Hemoglobin A1c    3. Barrett's esophagus without dysplasia  K22.70     4. Fatty liver  K76.0     5. Abdominal aortic aneurysm (AAA) without rupture, unspecified part  I71.40     6. Elevated PSA  R97.20     7. B12 deficiency  E53.8 Vitamin B12    8. Abdominal aortic aneurysm (AAA) without rupture, unspecified part Chronic I71.40      Meds ordered this encounter  Medications   traMADol (ULTRAM) 50 MG tablet    Sig: Take 1-2 tablets (50-100 mg total) by mouth 2 (two) times daily as needed for severe pain (do not drive 8 hours after taking).    Dispense:  60 tablet    Refill:  5    I,Harris Phan,acting as a scribe for [name], MD.,have documented all relevant documentation on the behalf of [name], MD,as directed by  [name], MD while in the presence of [name], MD.  I, [name], MD, have reviewed all documentation for this visit. The  documentation on 06/11/21 for the exam, diagnosis, procedures, and orders are all accurate and complete.   Return precautions advised.  [name], MD

## 2021-06-12 LAB — CBC WITH DIFFERENTIAL/PLATELET
Basophils Absolute: 0 10*3/uL (ref 0.0–0.1)
Basophils Relative: 0.3 % (ref 0.0–3.0)
Eosinophils Absolute: 0.1 10*3/uL (ref 0.0–0.7)
Eosinophils Relative: 2.2 % (ref 0.0–5.0)
HCT: 42.4 % (ref 39.0–52.0)
Hemoglobin: 14.3 g/dL (ref 13.0–17.0)
Lymphocytes Relative: 26.9 % (ref 12.0–46.0)
Lymphs Abs: 1.3 10*3/uL (ref 0.7–4.0)
MCHC: 33.7 g/dL (ref 30.0–36.0)
MCV: 86.7 fl (ref 78.0–100.0)
Monocytes Absolute: 0.7 10*3/uL (ref 0.1–1.0)
Monocytes Relative: 15 % — ABNORMAL HIGH (ref 3.0–12.0)
Neutro Abs: 2.6 10*3/uL (ref 1.4–7.7)
Neutrophils Relative %: 55.6 % (ref 43.0–77.0)
Platelets: 160 10*3/uL (ref 150.0–400.0)
RBC: 4.89 Mil/uL (ref 4.22–5.81)
RDW: 14.3 % (ref 11.5–15.5)
WBC: 4.7 10*3/uL (ref 4.0–10.5)

## 2021-06-12 LAB — COMPREHENSIVE METABOLIC PANEL
ALT: 13 U/L (ref 0–53)
AST: 17 U/L (ref 0–37)
Albumin: 4 g/dL (ref 3.5–5.2)
Alkaline Phosphatase: 69 U/L (ref 39–117)
BUN: 10 mg/dL (ref 6–23)
CO2: 28 mEq/L (ref 19–32)
Calcium: 9.3 mg/dL (ref 8.4–10.5)
Chloride: 105 mEq/L (ref 96–112)
Creatinine, Ser: 1.09 mg/dL (ref 0.40–1.50)
GFR: 65.33 mL/min (ref >60.00)
Glucose, Bld: 90 mg/dL (ref 70–99)
Potassium: 4.2 mEq/L (ref 3.5–5.1)
Sodium: 139 mEq/L (ref 135–145)
Total Bilirubin: 0.7 mg/dL (ref 0.2–1.2)
Total Protein: 6.4 g/dL (ref 6.0–8.3)

## 2021-06-12 LAB — VITAMIN B12: Vitamin B-12: 685 pg/mL (ref 211–911)

## 2021-06-12 LAB — HEMOGLOBIN A1C: Hgb A1c MFr Bld: 5.6 % (ref 4.6–6.5)

## 2021-06-21 ENCOUNTER — Other Ambulatory Visit: Payer: Self-pay

## 2021-06-21 ENCOUNTER — Ambulatory Visit
Admission: EM | Admit: 2021-06-21 | Discharge: 2021-06-21 | Disposition: A | Discharge status: Home / Self Care | Payer: Medicare Other | Attending: Internal Medicine | Admitting: Internal Medicine

## 2021-06-21 ENCOUNTER — Encounter: Payer: Self-pay | Admitting: Emergency Medicine

## 2021-06-21 DIAGNOSIS — S0591XA Unspecified injury of right eye and orbit, initial encounter: Secondary | ICD-10-CM

## 2021-06-21 DIAGNOSIS — S0501XA Injury of conjunctiva and corneal abrasion without foreign body, right eye, initial encounter: Secondary | ICD-10-CM

## 2021-06-21 MED ORDER — ERYTHROMYCIN 5 MG/GM OP OINT: TOPICAL_OINTMENT | OPHTHALMIC | 0 refills | Status: DC

## 2021-06-21 NOTE — ED Triage Notes (Signed)
Had a wood chip fly in his eye yesterday. Rinsed it out and placed "lumify" eyedrops in eye. Cornea very red today.

## 2021-06-21 NOTE — ED Provider Notes (Signed)
[name]    CSN: [id number] Arrival date & time: 06/21/21  1206      History   Chief Complaint Chief Complaint  Patient presents with   Eye Problem    HPI Jose [name] is a 80 y.o. male.   Patient presents for further evaluation of right eye pain that started yesterday after an injury.  Patient reports that a wood chip flew into his eye yesterday.  He removed the wood chip and washed his eye out.  Also applied Lumify eyedrops into right eye.  Patient denies any pain, irritation, drainage, itchiness from the eye.  Patient is mainly concerned due to the redness of the eye.  Denies any blurry vision.  Patient does not wear glasses or contacts.  Patient denies taking blood thinners.   Eye Problem  Past Medical History:  Diagnosis Date   Acute lower GI bleeding 01/25/2013; 09/2014   diverticular bleed   Acute prostatitis 02/21/2019   ANXIETY 08/22/2008   Arthritis    "a little bit" (04/14/2015)   Barrett's esophagus 2003   hematochezia as a result since 2003   Basal cell cancer    removed from right forearm.    Bradycardia 04/14/2015   CORONARY ARTERY DISEASE    2 stents   Depression    DIVERTICULOSIS, COLON 2003   ERECTILE DYSFUNCTION 08/22/2008   GERD (gastroesophageal reflux disease)    Heart murmur 04/14/2015   "they think I did; that's why I'm here" (04/14/2015)   History of kidney stones    HYPERLIPIDEMIA 02/09/2007   Kidney stones    "blasted or passed" (04/14/2015)   MI (myocardial infarction) ([place]) 2000   Numbness    right side - history of    OBESITY 03/20/2010   Squamous cell cancer of skin of earlobe    removed from area behind right earlobe   Stroke ([place])    4 yrs ago possible mini stroke -  tests never found anything    Patient Active Problem List   Diagnosis Date Noted   Vitamin B12 deficiency 12/17/2020   History of COVID-19.  Hospitalization November 2020 with acute respiratory failure. 07/09/2019   Aortic aneurysm ([place])  06/04/2019   Fatty liver 06/04/2019   Aortic atherosclerosis ([place]) 06/04/2019   Acute prostatitis 02/21/2019   GERD (gastroesophageal reflux disease) 10/23/2018   Nasal congestion 10/23/2018   Primary osteoarthritis of left knee 02/27/2018   Elevated PSA 08/03/2016   Benign prostatic hypertrophy (BPH) with nocturia 05/27/2016   Lumbosacral radiculopathy 06/17/2015   Numbness 06/17/2015   Right leg numbness    Sinus bradycardia 03/19/2015   Diverticulosis of colon with hemorrhage    Gastrointestinal hemorrhage 10/15/2014   Actinic keratosis 08/07/2014   Elevated blood pressure 03/29/2014   Hyperglycemia 02/26/2013   OBESITY 03/20/2010   Generalized anxiety disorder 08/22/2008   ERECTILE DYSFUNCTION 08/22/2008   Hyperlipidemia 02/09/2007   Atherosclerosis of coronary artery 02/09/2007   Barrett's esophagus 02/09/2007   Diverticulosis of large intestine 02/09/2007    Past Surgical History:  Procedure Laterality Date   BASAL CELL CARCINOMA EXCISION     posterior right ear.    CATARACT EXTRACTION, BILATERAL     COLONOSCOPY N/A 01/26/2013   Procedure: COLONOSCOPY;  Surgeon: Milus Banister, MD;  Location: [facility];  Service: Endoscopy;  Laterality: N/A;   CORONARY ANGIOPLASTY WITH STENT PLACEMENT  09/06/1998   LAD stent per Dr Elonda Husky in [place]. 2 stents.    CYSTOSCOPY W/ URETERAL STENT PLACEMENT Left 12/21/2016  Procedure: CYSTOSCOPY WITH RETROGRADE PYELOGRAM/URETERAL STENT PLACEMENT;  Surgeon: Alexis Frock, MD;  Location: [facility];  Service: Urology;  Laterality: Left;   ESOPHAGOGASTRODUODENOSCOPY (EGD) WITH ESOPHAGEAL DILATION     "q time I have an EGD; usually q 3-4 years" (04/14/2015)   EXTRACORPOREAL SHOCK WAVE LITHOTRIPSY Left 12/30/2016   Procedure: LEFT EXTRACORPOREAL SHOCK WAVE LITHOTRIPSY (ESWL);  Surgeon: Carolan Clines, MD;  Location: [facility];  Service: Urology;  Laterality: Left;   LITHOTRIPSY  X 2   SQUAMOUS CELL CARCINOMA EXCISION     right forearm    TONSILLECTOMY  1971   TOTAL KNEE ARTHROPLASTY Left 03/21/2018   TOTAL KNEE ARTHROPLASTY Left 03/21/2018   Procedure: LEFT TOTAL KNEE ARTHROPLASTY;  Surgeon: Renette Butters, MD;  Location: [place];  Service: Orthopedics;  Laterality: Left;   UPPER GASTROINTESTINAL ENDOSCOPY     URETERAL STENT PLACEMENT     2019       Home Medications    Prior to Admission medications   Medication Sig Start Date End Date Taking? Authorizing Provider  erythromycin ophthalmic ointment Place a 1/2 inch ribbon of ointment into the lower eyelid 4 times daily for 7 days. 06/21/21  Yes Teodora Medici, FNP  aspirin EC 81 MG tablet Take 1 tablet (81 mg total) by mouth 2 (two) times daily. For DVT prophylaxis Patient taking differently: Take 81 mg by mouth daily. For DVT prophylaxis 03/21/18   Prudencio Burly III, PA-C  fluticasone (FLONASE) 50 MCG/ACT nasal spray Place 2 sprays into both nostrils daily. Patient taking differently: Place 2 sprays into both nostrils as needed. 01/25/20   Tasia Catchings, Amy V, PA-C  ipratropium (ATROVENT) 0.06 % nasal spray Place 2 sprays into both nostrils 4 (four) times daily. Patient taking differently: Place 2 sprays into both nostrils as needed. 01/25/20   Tasia Catchings, Amy V, PA-C  loperamide (IMODIUM) 2 MG capsule Take 1 capsule (2 mg total) by mouth 2 (two) times daily as needed for diarrhea or loose stools. 04/19/21   Jaynee Eagles, PA-C  omeprazole (PRILOSEC) 40 MG capsule Take 1 capsule (40 mg total) by mouth daily. 03/19/21   Ladene Artist, MD  ondansetron (ZOFRAN-ODT) 8 MG disintegrating tablet Take 1 tablet (8 mg total) by mouth every 8 (eight) hours as needed for nausea or vomiting. 04/19/21   Jaynee Eagles, PA-C  rosuvastatin (CRESTOR) 20 MG tablet TAKE 1 TABLET DAILY 05/28/21   Marin Olp, MD  tadalafil (CIALIS) 20 MG tablet TAKE ONE-HALF (1/2) TABLET DAILY AS NEEDED FOR ERECTILE DYSFUNCTION 01/07/21   Marin Olp, MD  traMADol (ULTRAM) 50 MG tablet Take 1-2 tablets (50-100 mg  total) by mouth 2 (two) times daily as needed for severe pain (do not drive 8 hours after taking). 06/11/21   Marin Olp, MD    Family History Family History  Problem Relation Age of Onset   Alcohol abuse Father    Liver disease Father    Heart disease Brother        CABG   Colon cancer Neg Hx     Social History Social History   Tobacco Use   Smoking status: Former    Packs/day: 1.00    Years: 30.00    Pack years: 30.00    Types: Cigarettes   Smokeless tobacco: Never   Tobacco comments:    "quit smoking cigarettes in ~ 1990"  Vaping Use   Vaping Use: Never used  Substance Use Topics   Alcohol use: Yes    Comment:  Beer-monthly per pt   Drug use: No     Allergies   Doxycycline and Lipitor [atorvastatin]   Review of Systems Review of Systems Per HPI  Physical Exam Triage Vital Signs ED Triage Vitals  Enc Vitals Group     BP 06/21/21 1336 139/80     Pulse Rate 06/21/21 1336 (!) 51     Resp 06/21/21 1336 16     Temp 06/21/21 1336 97.7 F (36.5 C)     Temp Source 06/21/21 1336 Oral     SpO2 06/21/21 1336 95 %     Weight --      Height --      Head Circumference --      Peak Flow --      Pain Score 06/21/21 1337 0     Pain Loc --      Pain Edu? --      Excl. in [place]? --    No data found.  Updated Vital Signs BP 139/80 (BP Location: Left Arm)   Pulse (!) 51   Temp 97.7 F (36.5 C) (Oral)   Resp 16   SpO2 95%   Visual Acuity Right Eye Distance: 20/20 Left Eye Distance: 20/25 Bilateral Distance: 20/20  Right Eye Near:   Left Eye Near:    Bilateral Near:     Physical Exam Constitutional:      General: He is not in acute distress.    Appearance: Normal appearance. He is not toxic-appearing or diaphoretic.  HENT:     Head: Normocephalic and atraumatic.  Eyes:     General: Lids are normal. Lids are everted, no foreign bodies appreciated. Vision grossly intact. Gaze aligned appropriately.        Right eye: No foreign body, discharge or  hordeolum.     Extraocular Movements: Extraocular movements intact.     Conjunctiva/sclera: Conjunctivae normal.     Pupils: Pupils are equal, round, and reactive to light.     Left eye: No fluorescein uptake.     Comments: No fluorescein stain reuptake.  Diffuse redness imitating subconjunctival hemorrhage present throughout sclera.  No drainage noted.  Pupils normal.  Lids normal.  No foreign bodies present.  No drainage.  Pulmonary:     Effort: Pulmonary effort is normal.  Neurological:     General: No focal deficit present.     Mental Status: He is alert and oriented to person, place, and time. Mental status is at baseline.  Psychiatric:        Mood and Affect: Mood normal.        Behavior: Behavior normal.        Thought Content: Thought content normal.        Judgment: Judgment normal.     UC Treatments / Results  Labs (all labs ordered are listed, but only abnormal results are displayed) Labs Reviewed - No data to display  EKG   Radiology No results found.  Procedures Procedures (including critical care time)  Medications Ordered in UC Medications - No data to display  Initial Impression / Assessment and Plan / UC Course  I have reviewed the triage vital signs and the nursing notes.  Pertinent labs & imaging results that were available during my care of the patient were reviewed by me and considered in my medical decision making (see chart for details).     No definite fluorescein stain reuptake on exam.  Patient does have diffuse edema and hemorrhage.  Called on-call ophthalmology to discuss  patient's case given appearance of eye on exam.  Ophthalmology reports that blood should reabsorb and there were no concerns if visual acuity is normal.  Erythromycin prescribed to treat and/or prevent eye infection and advised patient to follow-up with provided contact information for ophthalmology for further evaluation and management.  Visual acuity normal today.Discussed  strict return precautions. Patient verbalized understanding and is agreeable with plan.  Final Clinical Impressions(s) / UC Diagnoses   Final diagnoses:  Cornea abrasion, right, initial encounter  Right eye injury, initial encounter     Discharge Instructions      You have been prescribed an antibiotic eye ointment to treat or prevent infection.  Please follow-up with eye doctor for further evaluation.     ED Prescriptions     Medication Sig Dispense Auth. Provider   erythromycin ophthalmic ointment Place a 1/2 inch ribbon of ointment into the lower eyelid 4 times daily for 7 days. 3.5 g Teodora Medici, [place]      PDMP not reviewed this encounter.   Teodora Medici, [place] 06/21/21 [phone]

## 2021-06-21 NOTE — Discharge Instructions (Signed)
You have been prescribed an antibiotic eye ointment to treat or prevent infection.  Please follow-up with eye doctor for further evaluation.

## 2021-07-15 ENCOUNTER — Other Ambulatory Visit: Payer: Self-pay

## 2021-07-15 ENCOUNTER — Encounter: Payer: Self-pay | Admitting: Emergency Medicine

## 2021-07-15 ENCOUNTER — Ambulatory Visit
Admission: EM | Admit: 2021-07-15 | Discharge: 2021-07-15 | Disposition: A | Discharge status: Home / Self Care | Payer: Medicare Other | Attending: Physician Assistant | Admitting: Physician Assistant

## 2021-07-15 DIAGNOSIS — J101 Influenza due to other identified influenza virus with other respiratory manifestations: Secondary | ICD-10-CM | POA: Diagnosis not present

## 2021-07-15 LAB — POCT INFLUENZA A/B
Influenza A, POC: POSITIVE — AB
Influenza B, POC: NEGATIVE

## 2021-07-15 MED ORDER — OSELTAMIVIR PHOSPHATE 75 MG PO CAPS: 75.0000 mg | ORAL_CAPSULE | Freq: Two times a day (BID) | ORAL | 0 refills | Status: DC

## 2021-07-15 NOTE — ED Provider Notes (Signed)
[name]    CSN: [id number] Arrival date & time: 07/15/21  1143      History   Chief Complaint Chief Complaint  Patient presents with   Cough    HPI Jose [name] is a 80 y.o. male.   Patient here today for evaluation of sore throat, congestion and cough that started 2 days ago.  He has not had fever.  He denies any vomiting or diarrhea.  He has tried TheraFlu with mild relief of symptoms.  The history is provided by the patient.  Cough Associated symptoms: chills and sore throat   Associated symptoms: no ear pain, no eye discharge, no fever and no shortness of breath    Past Medical History:  Diagnosis Date   Acute lower GI bleeding 01/25/2013; 09/2014   diverticular bleed   Acute prostatitis 02/21/2019   ANXIETY 08/22/2008   Arthritis    "a little bit" (04/14/2015)   Barrett's esophagus 2003   hematochezia as a result since 2003   Basal cell cancer    removed from right forearm.    Bradycardia 04/14/2015   CORONARY ARTERY DISEASE    2 stents   Depression    DIVERTICULOSIS, COLON 2003   ERECTILE DYSFUNCTION 08/22/2008   GERD (gastroesophageal reflux disease)    Heart murmur 04/14/2015   "they think I did; that's why I'm here" (04/14/2015)   History of kidney stones    HYPERLIPIDEMIA 02/09/2007   Kidney stones    "blasted or passed" (04/14/2015)   MI (myocardial infarction) ([place]) 2000   Numbness    right side - history of    OBESITY 03/20/2010   Squamous cell cancer of skin of earlobe    removed from area behind right earlobe   Stroke ([place])    4 yrs ago possible mini stroke -  tests never found anything    Patient Active Problem List   Diagnosis Date Noted   Vitamin B12 deficiency 12/17/2020   History of COVID-19.  Hospitalization November 2020 with acute respiratory failure. 07/09/2019   Aortic aneurysm ([place]) 06/04/2019   Fatty liver 06/04/2019   Aortic atherosclerosis ([place]) 06/04/2019   Acute prostatitis 02/21/2019   GERD  (gastroesophageal reflux disease) 10/23/2018   Nasal congestion 10/23/2018   Primary osteoarthritis of left knee 02/27/2018   Elevated PSA 08/03/2016   Benign prostatic hypertrophy (BPH) with nocturia 05/27/2016   Lumbosacral radiculopathy 06/17/2015   Numbness 06/17/2015   Right leg numbness    Sinus bradycardia 03/19/2015   Diverticulosis of colon with hemorrhage    Gastrointestinal hemorrhage 10/15/2014   Actinic keratosis 08/07/2014   Elevated blood pressure 03/29/2014   Hyperglycemia 02/26/2013   OBESITY 03/20/2010   Generalized anxiety disorder 08/22/2008   ERECTILE DYSFUNCTION 08/22/2008   Hyperlipidemia 02/09/2007   Atherosclerosis of coronary artery 02/09/2007   Barrett's esophagus 02/09/2007   Diverticulosis of large intestine 02/09/2007    Past Surgical History:  Procedure Laterality Date   BASAL CELL CARCINOMA EXCISION     posterior right ear.    CATARACT EXTRACTION, BILATERAL     COLONOSCOPY N/A 01/26/2013   Procedure: COLONOSCOPY;  Surgeon: Milus Banister, MD;  Location: [place];  Service: Endoscopy;  Laterality: N/A;   CORONARY ANGIOPLASTY WITH STENT PLACEMENT  09/06/1998   LAD stent per Dr Elonda Husky in [place]. 2 stents.    CYSTOSCOPY W/ URETERAL STENT PLACEMENT Left 12/21/2016   Procedure: CYSTOSCOPY WITH RETROGRADE PYELOGRAM/URETERAL STENT PLACEMENT;  Surgeon: Alexis Frock, MD;  Location: [facility];  Service: Urology;  Laterality: Left;   ESOPHAGOGASTRODUODENOSCOPY (EGD) WITH ESOPHAGEAL DILATION     "q time I have an EGD; usually q 3-4 years" (04/14/2015)   EXTRACORPOREAL SHOCK WAVE LITHOTRIPSY Left 12/30/2016   Procedure: LEFT EXTRACORPOREAL SHOCK WAVE LITHOTRIPSY (ESWL);  Surgeon: Carolan Clines, MD;  Location: [facility];  Service: Urology;  Laterality: Left;   LITHOTRIPSY  X 2   SQUAMOUS CELL CARCINOMA EXCISION     right forearm   TONSILLECTOMY  1971   TOTAL KNEE ARTHROPLASTY Left 03/21/2018   TOTAL KNEE ARTHROPLASTY Left 03/21/2018   Procedure: LEFT  TOTAL KNEE ARTHROPLASTY;  Surgeon: Renette Butters, MD;  Location: [place];  Service: Orthopedics;  Laterality: Left;   UPPER GASTROINTESTINAL ENDOSCOPY     URETERAL STENT PLACEMENT     2019       Home Medications    Prior to Admission medications   Medication Sig Start Date End Date Taking? Authorizing Provider  aspirin EC 81 MG tablet Take 1 tablet (81 mg total) by mouth 2 (two) times daily. For DVT prophylaxis Patient taking differently: Take 81 mg by mouth daily. For DVT prophylaxis 03/21/18  Yes Prudencio Burly III, PA-C  erythromycin ophthalmic ointment Place a 1/2 inch ribbon of ointment into the lower eyelid 4 times daily for 7 days. 06/21/21  Yes [place], Hildred Alamin E, FNP  fluticasone (FLONASE) 50 MCG/ACT nasal spray Place 2 sprays into both nostrils daily. Patient taking differently: Place 2 sprays into both nostrils as needed. 01/25/20  Yes Yu, Amy V, PA-C  ipratropium (ATROVENT) 0.06 % nasal spray Place 2 sprays into both nostrils 4 (four) times daily. Patient taking differently: Place 2 sprays into both nostrils as needed. 01/25/20  Yes Yu, Amy V, PA-C  loperamide (IMODIUM) 2 MG capsule Take 1 capsule (2 mg total) by mouth 2 (two) times daily as needed for diarrhea or loose stools. 04/19/21  Yes Jaynee Eagles, PA-C  omeprazole (PRILOSEC) 40 MG capsule Take 1 capsule (40 mg total) by mouth daily. 03/19/21  Yes Ladene Artist, MD  ondansetron (ZOFRAN-ODT) 8 MG disintegrating tablet Take 1 tablet (8 mg total) by mouth every 8 (eight) hours as needed for nausea or vomiting. 04/19/21  Yes Jaynee Eagles, PA-C  oseltamivir (TAMIFLU) 75 MG capsule Take 1 capsule (75 mg total) by mouth every 12 (twelve) hours. 07/15/21  Yes Francene Finders, PA-C  rosuvastatin (CRESTOR) 20 MG tablet TAKE 1 TABLET DAILY 05/28/21  Yes Marin Olp, MD  tadalafil (CIALIS) 20 MG tablet TAKE ONE-HALF (1/2) TABLET DAILY AS NEEDED FOR ERECTILE DYSFUNCTION 01/07/21  Yes Marin Olp, MD  traMADol (ULTRAM) 50  MG tablet Take 1-2 tablets (50-100 mg total) by mouth 2 (two) times daily as needed for severe pain (do not drive 8 hours after taking). 06/11/21  Yes Marin Olp, MD    Family History Family History  Problem Relation Age of Onset   Alcohol abuse Father    Liver disease Father    Heart disease Brother        CABG   Colon cancer Neg Hx     Social History Social History   Tobacco Use   Smoking status: Former    Packs/day: 1.00    Years: 30.00    Pack years: 30.00    Types: Cigarettes   Smokeless tobacco: Never   Tobacco comments:    "quit smoking cigarettes in ~ 1990"  Vaping Use   Vaping Use: Never used  Substance Use Topics  Alcohol use: Yes    Comment: Beer-monthly per pt   Drug use: No     Allergies   Doxycycline and Lipitor [atorvastatin]   Review of Systems Review of Systems  Constitutional:  Positive for chills. Negative for fever.  HENT:  Positive for congestion and sore throat. Negative for ear pain.   Eyes:  Negative for discharge and redness.  Respiratory:  Positive for cough. Negative for shortness of breath.   Gastrointestinal:  Negative for abdominal pain, nausea and vomiting.    Physical Exam Triage Vital Signs ED Triage Vitals  Enc Vitals Group     BP 07/15/21 1322 120/71     Pulse Rate 07/15/21 1322 (!) 57     Resp --      Temp 07/15/21 1322 98.5 F (36.9 C)     Temp Source 07/15/21 1322 Oral     SpO2 07/15/21 1322 98 %     Weight 07/15/21 1323 180 lb (81.6 kg)     Height 07/15/21 1323 5\' 10"  (1.778 m)     Head Circumference --      Peak Flow --      Pain Score 07/15/21 1322 0     Pain Loc --      Pain Edu? --      Excl. in [place]? --    No data found.  Updated Vital Signs BP 120/71 (BP Location: Left Arm)   Pulse (!) 57   Temp 98.5 F (36.9 C) (Oral)   Ht 5\' 10"  (1.778 m)   Wt 180 lb (81.6 kg)   SpO2 98%   BMI 25.83 kg/m   Physical Exam Vitals and nursing note reviewed.  Constitutional:      General: He is not in  acute distress.    Appearance: Normal appearance. He is not ill-appearing.  HENT:     Head: Normocephalic and atraumatic.     Nose: Congestion present.     Mouth/Throat:     Mouth: Mucous membranes are moist.     Pharynx: Oropharynx is clear. Posterior oropharyngeal erythema present. No oropharyngeal exudate.  Eyes:     Conjunctiva/sclera: Conjunctivae normal.  Cardiovascular:     Rate and Rhythm: Normal rate and regular rhythm.     Heart sounds: Normal heart sounds. No murmur heard. Pulmonary:     Effort: Pulmonary effort is normal. No respiratory distress.     Breath sounds: Normal breath sounds. No wheezing, rhonchi or rales.  Skin:    General: Skin is warm and dry.  Neurological:     Mental Status: He is alert.  Psychiatric:        Mood and Affect: Mood normal.        Thought Content: Thought content normal.     UC Treatments / Results  Labs (all labs ordered are listed, but only abnormal results are displayed) Labs Reviewed  POCT INFLUENZA A/B - Abnormal; Notable for the following components:      Result Value   Influenza A, POC Positive (*)    All other components within normal limits    EKG   Radiology No results found.  Procedures Procedures (including critical care time)  Medications Ordered in UC Medications - No data to display  Initial Impression / Assessment and Plan / UC Course  I have reviewed the triage vital signs and the nursing notes.  Pertinent labs & imaging results that were available during my care of the patient were reviewed by me and considered in my medical  decision making (see chart for details).    Flu test positive in office. Tamiflu prescribed. Recommend symptomatic treatment otherwise and follow up with any further concerns.   Final Clinical Impressions(s) / UC Diagnoses   Final diagnoses:  Influenza A   Discharge Instructions   None    ED Prescriptions     Medication Sig Dispense Auth. Provider   oseltamivir  (TAMIFLU) 75 MG capsule Take 1 capsule (75 mg total) by mouth every 12 (twelve) hours. 10 capsule Francene Finders, PA-C      PDMP not reviewed this encounter.   Francene Finders, PA-C 07/15/21 1352

## 2021-07-15 NOTE — ED Triage Notes (Signed)
Patient c/o productive cough at times, sore throat, congestion x 2 days.  Patient is afebrile.  Patient has been taken Thera-Flu.  Patient is vaccinated for COVID.

## 2021-07-16 ENCOUNTER — Other Ambulatory Visit: Payer: Self-pay

## 2021-07-16 ENCOUNTER — Emergency Department (HOSPITAL_COMMUNITY)
Admission: EM | Admit: 2021-07-16 | Discharge: 2021-07-16 | Disposition: A | Discharge status: Home / Self Care | Payer: Medicare Other | Attending: Emergency Medicine | Admitting: Emergency Medicine

## 2021-07-16 ENCOUNTER — Emergency Department (HOSPITAL_COMMUNITY): Payer: Medicare Other

## 2021-07-16 ENCOUNTER — Emergency Department (HOSPITAL_BASED_OUTPATIENT_CLINIC_OR_DEPARTMENT_OTHER)
Admission: EM | Admit: 2021-07-16 | Discharge: 2021-07-16 | Disposition: A | Discharge status: Home / Self Care | Payer: Medicare Other | Source: Home / Self Care | Attending: Emergency Medicine | Admitting: Emergency Medicine

## 2021-07-16 ENCOUNTER — Telehealth (HOSPITAL_COMMUNITY): Payer: Self-pay | Admitting: Emergency Medicine

## 2021-07-16 ENCOUNTER — Emergency Department (HOSPITAL_BASED_OUTPATIENT_CLINIC_OR_DEPARTMENT_OTHER): Payer: Medicare Other

## 2021-07-16 ENCOUNTER — Ambulatory Visit
Admission: EM | Admit: 2021-07-16 | Discharge: 2021-07-16 | Disposition: A | Discharge status: Home / Self Care | Payer: Medicare Other

## 2021-07-16 ENCOUNTER — Encounter (HOSPITAL_BASED_OUTPATIENT_CLINIC_OR_DEPARTMENT_OTHER): Payer: Self-pay

## 2021-07-16 DIAGNOSIS — R410 Disorientation, unspecified: Secondary | ICD-10-CM | POA: Insufficient documentation

## 2021-07-16 DIAGNOSIS — R4182 Altered mental status, unspecified: Secondary | ICD-10-CM | POA: Insufficient documentation

## 2021-07-16 DIAGNOSIS — Z7982 Long term (current) use of aspirin: Secondary | ICD-10-CM | POA: Diagnosis not present

## 2021-07-16 DIAGNOSIS — J111 Influenza due to unidentified influenza virus with other respiratory manifestations: Secondary | ICD-10-CM | POA: Diagnosis not present

## 2021-07-16 DIAGNOSIS — Z8616 Personal history of COVID-19: Secondary | ICD-10-CM | POA: Insufficient documentation

## 2021-07-16 DIAGNOSIS — Z96651 Presence of right artificial knee joint: Secondary | ICD-10-CM | POA: Insufficient documentation

## 2021-07-16 DIAGNOSIS — Z87891 Personal history of nicotine dependence: Secondary | ICD-10-CM | POA: Insufficient documentation

## 2021-07-16 DIAGNOSIS — Z20822 Contact with and (suspected) exposure to covid-19: Secondary | ICD-10-CM | POA: Insufficient documentation

## 2021-07-16 DIAGNOSIS — R0902 Hypoxemia: Secondary | ICD-10-CM | POA: Insufficient documentation

## 2021-07-16 DIAGNOSIS — R479 Unspecified speech disturbances: Secondary | ICD-10-CM | POA: Diagnosis not present

## 2021-07-16 DIAGNOSIS — Z955 Presence of coronary angioplasty implant and graft: Secondary | ICD-10-CM | POA: Insufficient documentation

## 2021-07-16 DIAGNOSIS — R0602 Shortness of breath: Secondary | ICD-10-CM | POA: Insufficient documentation

## 2021-07-16 DIAGNOSIS — Z85828 Personal history of other malignant neoplasm of skin: Secondary | ICD-10-CM | POA: Insufficient documentation

## 2021-07-16 DIAGNOSIS — J101 Influenza due to other identified influenza virus with other respiratory manifestations: Secondary | ICD-10-CM | POA: Insufficient documentation

## 2021-07-16 DIAGNOSIS — Z5321 Procedure and treatment not carried out due to patient leaving prior to being seen by health care provider: Secondary | ICD-10-CM | POA: Insufficient documentation

## 2021-07-16 DIAGNOSIS — R9431 Abnormal electrocardiogram [ECG] [EKG]: Secondary | ICD-10-CM | POA: Diagnosis not present

## 2021-07-16 DIAGNOSIS — I2510 Atherosclerotic heart disease of native coronary artery without angina pectoris: Secondary | ICD-10-CM | POA: Insufficient documentation

## 2021-07-16 DIAGNOSIS — I517 Cardiomegaly: Secondary | ICD-10-CM | POA: Diagnosis not present

## 2021-07-16 LAB — COMPREHENSIVE METABOLIC PANEL
ALT: 11 U/L (ref 0–44)
AST: 17 U/L (ref 15–41)
Albumin: 4.1 g/dL (ref 3.5–5.0)
Alkaline Phosphatase: 66 U/L (ref 38–126)
Anion gap: 8 (ref 5–15)
BUN: 13 mg/dL (ref 8–23)
CO2: 24 mmol/L (ref 22–32)
Calcium: 9.4 mg/dL (ref 8.9–10.3)
Chloride: 102 mmol/L (ref 98–111)
Creatinine, Ser: 1.19 mg/dL (ref 0.61–1.24)
GFR, Estimated: >60 mL/min (ref >60)
Glucose, Bld: 118 mg/dL — ABNORMAL HIGH (ref 70–99)
Potassium: 3.7 mmol/L (ref 3.5–5.1)
Sodium: 134 mmol/L — ABNORMAL LOW (ref 135–145)
Total Bilirubin: 1.4 mg/dL — ABNORMAL HIGH (ref 0.3–1.2)
Total Protein: 7.1 g/dL (ref 6.5–8.1)

## 2021-07-16 LAB — DIFFERENTIAL
Abs Immature Granulocytes: 0.03 10*3/uL (ref 0.00–0.07)
Basophils Absolute: 0 10*3/uL (ref 0.0–0.1)
Basophils Relative: 0 %
Eosinophils Absolute: 0 10*3/uL (ref 0.0–0.5)
Eosinophils Relative: 0 %
Immature Granulocytes: 0 %
Lymphocytes Relative: 4 %
Lymphs Abs: 0.5 10*3/uL — ABNORMAL LOW (ref 0.7–4.0)
Monocytes Absolute: 1.5 10*3/uL — ABNORMAL HIGH (ref 0.1–1.0)
Monocytes Relative: 15 %
Neutro Abs: 8.2 10*3/uL — ABNORMAL HIGH (ref 1.7–7.7)
Neutrophils Relative %: 80 %

## 2021-07-16 LAB — CBC
HCT: 44.1 % (ref 39.0–52.0)
Hemoglobin: 14.8 g/dL (ref 13.0–17.0)
MCH: 29 pg (ref 26.0–34.0)
MCHC: 33.6 g/dL (ref 30.0–36.0)
MCV: 86.3 fL (ref 80.0–100.0)
Platelets: 116 10*3/uL — ABNORMAL LOW (ref 150–400)
RBC: 5.11 MIL/uL (ref 4.22–5.81)
RDW: 13.5 % (ref 11.5–15.5)
WBC: 10.2 10*3/uL (ref 4.0–10.5)
nRBC: 0 % (ref 0.0–0.2)

## 2021-07-16 LAB — RESP PANEL BY RT-PCR (FLU A&B, COVID) ARPGX2
Influenza A by PCR: POSITIVE — AB
Influenza B by PCR: NEGATIVE
SARS Coronavirus 2 by RT PCR: NEGATIVE

## 2021-07-16 LAB — BRAIN NATRIURETIC PEPTIDE: B Natriuretic Peptide: 97.8 pg/mL (ref 0.0–100.0)

## 2021-07-16 LAB — CBG MONITORING, ED: Glucose-Capillary: 110 mg/dL — ABNORMAL HIGH (ref 70–99)

## 2021-07-16 LAB — TROPONIN I (HIGH SENSITIVITY): Troponin I (High Sensitivity): 13 ng/L (ref <18)

## 2021-07-16 MED ORDER — ACETAMINOPHEN 325 MG PO TABS: 650.0000 mg | ORAL_TABLET | Freq: Once | ORAL | Status: DC

## 2021-07-16 MED ORDER — ONDANSETRON 4 MG PO TBDP
4.0000 mg | ORAL_TABLET | Freq: Three times a day (TID) | ORAL | 0 refills | Status: DC | PRN
Start: 2021-07-16 — End: 2021-08-27

## 2021-07-16 MED ORDER — SODIUM CHLORIDE 0.9 % IV BOLUS
1000.0000 mL | Freq: Once | INTRAVENOUS | Status: AC
  Administered 2021-07-16: 16:00:00 1000 mL via INTRAVENOUS

## 2021-07-16 MED ORDER — GUAIFENESIN ER 600 MG PO TB12
600.0000 mg | ORAL_TABLET | Freq: Two times a day (BID) | ORAL | 0 refills | Status: AC
Start: 2021-07-16 — End: 2021-07-26

## 2021-07-16 MED ORDER — LEVOFLOXACIN 750 MG PO TABS: 750.0000 mg | ORAL_TABLET | Freq: Every day | ORAL | 0 refills | Status: AC

## 2021-07-16 NOTE — ED Provider Notes (Signed)
Emergency Medicine Provider Triage Evaluation Note  Jose [name] , a 80 y.o. male  was evaluated in triage.  Pt complains of AMS. He was seen by his pcp today and was noted to be confused so was sent here for further eval.  Pt notes that he was dx with the flu yesterday at urgent care.   Review of Systems  Positive: Congestion, cough, fever, confusion Negative: Sob,   Physical Exam  BP 137/75 (BP Location: Right Arm)   Pulse (!) 104   Temp (!) 100.7 F (38.2 C) (Oral)   Resp 20   SpO2 94%  Gen:   Awake, no distress   Resp:  Normal effort  MSK:   Moves extremities without difficulty  Other:  Intermittently having trouble with word finding but ultimately able to provide hx, speech is clear, no facial droop, unilateral numbness/weakness  Medical Decision Making  Medically screening exam initiated at 1:21 PM.  Appropriate orders placed.  Jose [name] was informed that the remainder of the evaluation will be completed by another provider, this initial triage assessment does not replace that evaluation, and the importance of remaining in the ED until their evaluation is complete.     Rodney Booze, PA-C 07/16/21 1325    Truddie Hidden, MD 07/16/21 [phone]

## 2021-07-16 NOTE — ED Provider Notes (Signed)
[facility] EMERGENCY DEPT Provider Note   CSN: [id number] Arrival date & time: 07/16/21  1520     History Chief Complaint  Patient presents with   Altered Mental Status    Jose [name] is a 80 y.o. male with history of coronary disease presenting from home with confusion.  He is here with his daughter provides supplemental history.  The patient lives with his wife at home but his daughter visits daily.  They report that he was diagnosed with influenza 5 days ago on Monday.  He has been having cough, congestion since then.  His daughter is concerned about worsening confusion.  He says this is waxing and waning.  He will confuses dates and repeat himself and seems to have a slurred speech.  She said this is very unusual and normally he is extremely lucid and lives independently.  They went to an urgent care today and referred to the ED for stroke evaluation.  They had a CT scan performed from triage which was unremarkable, but then left without blood work or being seen by provider due to long waiting times.  They came here instead.  The patient and his daughter deny that he has had facial droop, numbness or weakness of the arms or legs.  The patient reports that he feels that he has some cough congestion and tightness in his chest.  He was started on Tamiflu 4 days ago but is only taken 3 doses.  HPI     Past Medical History:  Diagnosis Date   Acute lower GI bleeding 01/25/2013; 09/2014   diverticular bleed   Acute prostatitis 02/21/2019   ANXIETY 08/22/2008   Arthritis    "a little bit" (04/14/2015)   Barrett's esophagus 2003   hematochezia as a result since 2003   Basal cell cancer    removed from right forearm.    Bradycardia 04/14/2015   CORONARY ARTERY DISEASE    2 stents   Depression    DIVERTICULOSIS, COLON 2003   ERECTILE DYSFUNCTION 08/22/2008   GERD (gastroesophageal reflux disease)    Heart murmur 04/14/2015   "they think I did; that's why I'm here"  (04/14/2015)   History of kidney stones    HYPERLIPIDEMIA 02/09/2007   Kidney stones    "blasted or passed" (04/14/2015)   MI (myocardial infarction) ([place]) 2000   Numbness    right side - history of    OBESITY 03/20/2010   Squamous cell cancer of skin of earlobe    removed from area behind right earlobe   Stroke ([place])    4 yrs ago possible mini stroke -  tests never found anything    Patient Active Problem List   Diagnosis Date Noted   Vitamin B12 deficiency 12/17/2020   History of COVID-19.  Hospitalization November 2020 with acute respiratory failure. 07/09/2019   Aortic aneurysm ([place]) 06/04/2019   Fatty liver 06/04/2019   Aortic atherosclerosis ([place]) 06/04/2019   Acute prostatitis 02/21/2019   GERD (gastroesophageal reflux disease) 10/23/2018   Nasal congestion 10/23/2018   Primary osteoarthritis of left knee 02/27/2018   Elevated PSA 08/03/2016   Benign prostatic hypertrophy (BPH) with nocturia 05/27/2016   Lumbosacral radiculopathy 06/17/2015   Numbness 06/17/2015   Right leg numbness    Sinus bradycardia 03/19/2015   Diverticulosis of colon with hemorrhage    Gastrointestinal hemorrhage 10/15/2014   Actinic keratosis 08/07/2014   Elevated blood pressure 03/29/2014   Hyperglycemia 02/26/2013   OBESITY 03/20/2010   Generalized anxiety disorder 08/22/2008  ERECTILE DYSFUNCTION 08/22/2008   Hyperlipidemia 02/09/2007   Atherosclerosis of coronary artery 02/09/2007   Barrett's esophagus 02/09/2007   Diverticulosis of large intestine 02/09/2007    Past Surgical History:  Procedure Laterality Date   BASAL CELL CARCINOMA EXCISION     posterior right ear.    CATARACT EXTRACTION, BILATERAL     COLONOSCOPY N/A 01/26/2013   Procedure: COLONOSCOPY;  Surgeon: Milus Banister, MD;  Location: [facility];  Service: Endoscopy;  Laterality: N/A;   CORONARY ANGIOPLASTY WITH STENT PLACEMENT  09/06/1998   LAD stent per Dr Elonda Husky in [place]. 2 stents.    CYSTOSCOPY W/ URETERAL  STENT PLACEMENT Left 12/21/2016   Procedure: CYSTOSCOPY WITH RETROGRADE PYELOGRAM/URETERAL STENT PLACEMENT;  Surgeon: Alexis Frock, MD;  Location: [facility];  Service: Urology;  Laterality: Left;   ESOPHAGOGASTRODUODENOSCOPY (EGD) WITH ESOPHAGEAL DILATION     "q time I have an EGD; usually q 3-4 years" (04/14/2015)   EXTRACORPOREAL SHOCK WAVE LITHOTRIPSY Left 12/30/2016   Procedure: LEFT EXTRACORPOREAL SHOCK WAVE LITHOTRIPSY (ESWL);  Surgeon: Carolan Clines, MD;  Location: [facility];  Service: Urology;  Laterality: Left;   LITHOTRIPSY  X 2   SQUAMOUS CELL CARCINOMA EXCISION     right forearm   TONSILLECTOMY  1971   TOTAL KNEE ARTHROPLASTY Left 03/21/2018   TOTAL KNEE ARTHROPLASTY Left 03/21/2018   Procedure: LEFT TOTAL KNEE ARTHROPLASTY;  Surgeon: Renette Butters, MD;  Location: [facility];  Service: Orthopedics;  Laterality: Left;   UPPER GASTROINTESTINAL ENDOSCOPY     URETERAL STENT PLACEMENT     2019       Family History  Problem Relation Age of Onset   Alcohol abuse Father    Liver disease Father    Heart disease Brother        CABG   Colon cancer Neg Hx     Social History   Tobacco Use   Smoking status: Former    Packs/day: 1.00    Years: 30.00    Pack years: 30.00    Types: Cigarettes   Smokeless tobacco: Never   Tobacco comments:    "quit smoking cigarettes in ~ 1990"  Vaping Use   Vaping Use: Never used  Substance Use Topics   Alcohol use: Yes    Comment: Beer-monthly per pt   Drug use: No    Home Medications Prior to Admission medications   Medication Sig Start Date End Date Taking? Authorizing Provider  guaiFENesin (MUCINEX) 600 MG 12 hr tablet Take 1 tablet (600 mg total) by mouth 2 (two) times daily for 10 days. 07/16/21 07/26/21 Yes Elinor Kleine, Carola Rhine, MD  levofloxacin (LEVAQUIN) 750 MG tablet Take 1 tablet (750 mg total) by mouth daily for 5 days. 07/16/21 07/21/21 Yes Marriah Sanderlin, Carola Rhine, MD  ondansetron (ZOFRAN ODT) 4 MG disintegrating tablet Take 1 tablet (4  mg total) by mouth every 8 (eight) hours as needed for up to 15 doses for nausea or vomiting. 07/16/21  Yes Wyvonnia Dusky, MD  aspirin EC 81 MG tablet Take 1 tablet (81 mg total) by mouth 2 (two) times daily. For DVT prophylaxis Patient taking differently: Take 81 mg by mouth daily. For DVT prophylaxis 03/21/18   Prudencio Burly III, PA-C  erythromycin ophthalmic ointment Place a 1/2 inch ribbon of ointment into the lower eyelid 4 times daily for 7 days. 06/21/21   Teodora Medici, FNP  fluticasone (FLONASE) 50 MCG/ACT nasal spray Place 2 sprays into both nostrils daily. Patient taking differently: Place 2  sprays into both nostrils as needed. 01/25/20   Tasia Catchings, Amy V, PA-C  ipratropium (ATROVENT) 0.06 % nasal spray Place 2 sprays into both nostrils 4 (four) times daily. Patient taking differently: Place 2 sprays into both nostrils as needed. 01/25/20   Tasia Catchings, Amy V, PA-C  loperamide (IMODIUM) 2 MG capsule Take 1 capsule (2 mg total) by mouth 2 (two) times daily as needed for diarrhea or loose stools. 04/19/21   Jaynee Eagles, PA-C  omeprazole (PRILOSEC) 40 MG capsule Take 1 capsule (40 mg total) by mouth daily. 03/19/21   Ladene Artist, MD  ondansetron (ZOFRAN-ODT) 8 MG disintegrating tablet Take 1 tablet (8 mg total) by mouth every 8 (eight) hours as needed for nausea or vomiting. 04/19/21   Jaynee Eagles, PA-C  oseltamivir (TAMIFLU) 75 MG capsule Take 1 capsule (75 mg total) by mouth every 12 (twelve) hours. 07/15/21   Francene Finders, PA-C  rosuvastatin (CRESTOR) 20 MG tablet TAKE 1 TABLET DAILY 05/28/21   Marin Olp, MD  tadalafil (CIALIS) 20 MG tablet TAKE ONE-HALF (1/2) TABLET DAILY AS NEEDED FOR ERECTILE DYSFUNCTION 01/07/21   Marin Olp, MD  traMADol (ULTRAM) 50 MG tablet Take 1-2 tablets (50-100 mg total) by mouth 2 (two) times daily as needed for severe pain (do not drive 8 hours after taking). 06/11/21   Marin Olp, MD    Allergies    Doxycycline and Lipitor  [atorvastatin]  Review of Systems   Review of Systems  Constitutional:  Positive for appetite change, chills, fatigue and fever.  HENT:  Positive for congestion and sore throat. Negative for ear pain.   Eyes:  Negative for pain and visual disturbance.  Respiratory:  Positive for cough, chest tightness and shortness of breath.   Cardiovascular:  Negative for chest pain and palpitations.  Gastrointestinal:  Negative for abdominal pain and vomiting.  Genitourinary:  Negative for dysuria and hematuria.  Musculoskeletal:  Negative for arthralgias and back pain.  Skin:  Negative for color change and rash.  Neurological:  Positive for speech difficulty. Negative for syncope, facial asymmetry, weakness and light-headedness.  Psychiatric/Behavioral:  Positive for confusion. Negative for agitation.   All other systems reviewed and are negative.  Physical Exam Updated Vital Signs BP (!) 144/75   Pulse 84   Temp 98.6 F (37 C) (Oral)   Resp (!) 23   Ht 5\' 10"  (1.778 m)   Wt 86.2 kg   SpO2 92%   BMI 27.26 kg/m   Physical Exam Constitutional:      General: He is not in acute distress.    Comments: Slow speech, repeating himself, difficulty completing thoughts  HENT:     Head: Normocephalic and atraumatic.  Eyes:     Conjunctiva/sclera: Conjunctivae normal.     Pupils: Pupils are equal, round, and reactive to light.  Cardiovascular:     Rate and Rhythm: Normal rate and regular rhythm.  Pulmonary:     Effort: Pulmonary effort is normal. No respiratory distress.  Abdominal:     General: There is no distension.     Tenderness: There is no abdominal tenderness.  Skin:    General: Skin is warm and dry.  Neurological:     General: No focal deficit present.     Mental Status: He is alert and oriented to person, place, and time. Mental status is at baseline.     Sensory: No sensory deficit.     Motor: No weakness.     Coordination: Coordination normal.  Gait: Gait normal.   Psychiatric:        Mood and Affect: Mood normal.        Behavior: Behavior normal.    ED Results / Procedures / Treatments   Labs (all labs ordered are listed, but only abnormal results are displayed) Labs Reviewed  RESP PANEL BY RT-PCR (FLU A&B, COVID) ARPGX2 - Abnormal; Notable for the following components:      Result Value   Influenza A by PCR POSITIVE (*)    All other components within normal limits  COMPREHENSIVE METABOLIC PANEL - Abnormal; Notable for the following components:   Sodium 134 (*)    Glucose, Bld 118 (*)    Total Bilirubin 1.4 (*)    All other components within normal limits  CBC - Abnormal; Notable for the following components:   Platelets 116 (*)    All other components within normal limits  DIFFERENTIAL - Abnormal; Notable for the following components:   Neutro Abs 8.2 (*)    Lymphs Abs 0.5 (*)    Monocytes Absolute 1.5 (*)    All other components within normal limits  CBG MONITORING, ED - Abnormal; Notable for the following components:   Glucose-Capillary 110 (*)    All other components within normal limits  BRAIN NATRIURETIC PEPTIDE  TROPONIN I (HIGH SENSITIVITY)    EKG EKG Interpretation  Date/Time:  Thursday July 16 2021 15:33:11 EST Ventricular Rate:  87 PR Interval:  198 QRS Duration: 158 QT Interval:  376 QTC Calculation: 453 R Axis:   34 Text Interpretation: Sinus rhythm Right bundle branch block Confirmed by Octaviano Glow [phone]) on 07/16/2021 4:16:53 PM  Radiology CT Head Wo Contrast  Result Date: 07/16/2021 CLINICAL DATA:  Altered mental status EXAM: CT HEAD WITHOUT CONTRAST TECHNIQUE: Contiguous axial images were obtained from the base of the skull through the vertex without intravenous contrast. COMPARISON:  12/08/2017 FINDINGS: Brain: No acute intracranial findings are seen. There are no epidural or subdural fluid collections. Cortical sulci are prominent. Ventricles are not dilated. There is no shift of midline  structures. Vascular: There are scattered arterial calcifications. Skull: Unremarkable. Sinuses/Orbits: There is mucosal thickening in the ethmoid and maxillary sinuses. Other: No significant interval changes are noted. IMPRESSION: No acute intracranial findings are seen in noncontrast CT brain. Atrophy. Chronic sinusitis. Electronically Signed   By: Elmer Picker M.D.   On: 07/16/2021 13:59   DG Chest Portable 1 View  Result Date: 07/16/2021 CLINICAL DATA:  Altered mental status EXAM: PORTABLE CHEST 1 VIEW COMPARISON:  07/09/2019 FINDINGS: Transverse diameter of heart is increased. Thoracic aorta is ectatic. There is increase in interstitial markings in the left mid and both lower lung fields. There is improvement in aeration of right parahilar region. There is no focal consolidation. There is no pleural effusion or pneumothorax. IMPRESSION: Cardiomegaly. There are no signs of alveolar pulmonary edema or focal pulmonary consolidation. Increased interstitial markings are seen in the periphery of left mid and both lower lung fields suggesting scarring or interstitial pneumonitis. There is no pleural effusion or pneumothorax. Electronically Signed   By: Elmer Picker M.D.   On: 07/16/2021 16:24    Procedures Procedures   Medications Ordered in ED Medications  sodium chloride 0.9 % bolus 1,000 mL (0 mLs Intravenous Stopped 07/16/21 1739)    ED Course  I have reviewed the triage vital signs and the nursing notes.  Pertinent labs & imaging results that were available during my care of the patient were reviewed by me  and considered in my medical decision making (see chart for details).  Patient is here with suspected influenza now day 5, with waxing and waning confusion.  He has no headache to suggest viral or aseptic meningitis.  I suspect this is likely sequelae of influenza.  He is somewhat hypoxic with an oxygen saturation of 90%, with no known lung disease or smoking history per his  report or his daughters.  We are pending an x-ray.  We will also add on a COVID test.  Trop 13, BNP normal, CBC with some left shift ECG with no acute ischemia  Overall I have a lower suspicion for ACS, PE, PTX, or sepsis at this time.  I suspect this waxing and waning confusion is likely 2/2 influenza.  02 saturation between 90-95% at rest in bed  Supplemental hx provided by daughter at bedside   Clinical Course as of 07/17/21 0845  Thu Jul 16, 2021  1752 My reassessment the patient is lucid and coherent, and his daughter reports significant improvement of his mental status.  His oxygen saturations fluctuated between 90% and 95% while at rest on the bed.  We had a long discussion about observation in the hospital there is borderline hypoxia and waxing and waning confusion, versus discharge home.  They are adamant that they are going home and would not stay in the hospital.  They understand my concerns about his oxygen level promised to come back if his levels drop at home, or if his confusion worsens.  I also recommended PCP follow-up this week.  With his left shift on CBC and productive cough and hypoxia, I do think treatment for community pneumonia would be reasonable.  He is allergic to doxycycline, we discussed Levaquin as a second line medication, and they are in agreement with this medicine.  Okay for discharge [MT]    Clinical Course User Index [MT] Yamile Roedl, Carola Rhine, MD    Final Clinical Impression(s) / ED Diagnoses Final diagnoses:  Influenza  Hypoxia  Confusion    Rx / DC Orders ED Discharge Orders          Ordered    levofloxacin (LEVAQUIN) 750 MG tablet  Daily        07/16/21 1749    ondansetron (ZOFRAN ODT) 4 MG disintegrating tablet  Every 8 hours PRN        07/16/21 1749    guaiFENesin (MUCINEX) 600 MG 12 hr tablet  2 times daily        07/16/21 1749             Wyvonnia Dusky, MD 07/17/21 [phone]

## 2021-07-16 NOTE — ED Notes (Signed)
Patient is being discharged from the Urgent Care and sent to the Emergency Department via [facility] with daughter. Per Ewell Poe, PA, patient is in need of higher level of care due to Altered Mental Status. Patient family verbalized understanding of plan of care.  Vitals:   07/16/21 1019  BP: (!) 165/88  Pulse: 85  Resp: 18  Temp: 98.7 F (37.1 C)  SpO2: 93%

## 2021-07-16 NOTE — Telephone Encounter (Signed)
Patient's daughter called and verbalized frustration that there were no prescriptions waiting at the pharmacy for the patient.  Upon review of provider note for today, it was recommended patient go to ED for further evaluation due to confusion with other symptoms.  Reviewed with daughter who became more frustrated and states they were told, if patient did not want to go to the ER, that the provider would send in medications.  Awaiting provider review for guidance.

## 2021-07-16 NOTE — ED Notes (Signed)
Requesting to leave.  States I just need something for congestion and nausea like phenergan.  Dr. Langston Masker aware of patient request.

## 2021-07-16 NOTE — ED Provider Notes (Signed)
[name]    CSN: [id number] Arrival date & time: 07/16/21  0851      History   Chief Complaint Chief Complaint  Patient presents with   Nasal Congestion    HPI Jose [name] is a 80 y.o. male.   Patient here today initially for evaluation of congestion. Within seconds of speaking with patient realized he was having difficulty finding words. Eventually he agrees that he has had some incoherent thoughts this morning and actually missed a turn to his daughter's house this morning (a drive he makes daily per daughter). He did drive himself to our clinic this morning. He denies any headache. He reports he mainly was coming for more medication to help with symptoms and he thinks if he goes home he will feel better and his wife can help him.   The history is provided by the patient.   Past Medical History:  Diagnosis Date   Acute lower GI bleeding 01/25/2013; 09/2014   diverticular bleed   Acute prostatitis 02/21/2019   ANXIETY 08/22/2008   Arthritis    "a little bit" (04/14/2015)   Barrett's esophagus 2003   hematochezia as a result since 2003   Basal cell cancer    removed from right forearm.    Bradycardia 04/14/2015   CORONARY ARTERY DISEASE    2 stents   Depression    DIVERTICULOSIS, COLON 2003   ERECTILE DYSFUNCTION 08/22/2008   GERD (gastroesophageal reflux disease)    Heart murmur 04/14/2015   "they think I did; that's why I'm here" (04/14/2015)   History of kidney stones    HYPERLIPIDEMIA 02/09/2007   Kidney stones    "blasted or passed" (04/14/2015)   MI (myocardial infarction) ([place]) 2000   Numbness    right side - history of    OBESITY 03/20/2010   Squamous cell cancer of skin of earlobe    removed from area behind right earlobe   Stroke ([place])    4 yrs ago possible mini stroke -  tests never found anything    Patient Active Problem List   Diagnosis Date Noted   Vitamin B12 deficiency 12/17/2020   History of COVID-19.  Hospitalization  November 2020 with acute respiratory failure. 07/09/2019   Aortic aneurysm ([place]) 06/04/2019   Fatty liver 06/04/2019   Aortic atherosclerosis ([place]) 06/04/2019   Acute prostatitis 02/21/2019   GERD (gastroesophageal reflux disease) 10/23/2018   Nasal congestion 10/23/2018   Primary osteoarthritis of left knee 02/27/2018   Elevated PSA 08/03/2016   Benign prostatic hypertrophy (BPH) with nocturia 05/27/2016   Lumbosacral radiculopathy 06/17/2015   Numbness 06/17/2015   Right leg numbness    Sinus bradycardia 03/19/2015   Diverticulosis of colon with hemorrhage    Gastrointestinal hemorrhage 10/15/2014   Actinic keratosis 08/07/2014   Elevated blood pressure 03/29/2014   Hyperglycemia 02/26/2013   OBESITY 03/20/2010   Generalized anxiety disorder 08/22/2008   ERECTILE DYSFUNCTION 08/22/2008   Hyperlipidemia 02/09/2007   Atherosclerosis of coronary artery 02/09/2007   Barrett's esophagus 02/09/2007   Diverticulosis of large intestine 02/09/2007    Past Surgical History:  Procedure Laterality Date   BASAL CELL CARCINOMA EXCISION     posterior right ear.    CATARACT EXTRACTION, BILATERAL     COLONOSCOPY N/A 01/26/2013   Procedure: COLONOSCOPY;  Surgeon: Milus Banister, MD;  Location: [facility];  Service: Endoscopy;  Laterality: N/A;   CORONARY ANGIOPLASTY WITH STENT PLACEMENT  09/06/1998   LAD stent per Dr Elonda Husky in [place].  2 stents.    CYSTOSCOPY W/ URETERAL STENT PLACEMENT Left 12/21/2016   Procedure: CYSTOSCOPY WITH RETROGRADE PYELOGRAM/URETERAL STENT PLACEMENT;  Surgeon: Alexis Frock, MD;  Location: [facility];  Service: Urology;  Laterality: Left;   ESOPHAGOGASTRODUODENOSCOPY (EGD) WITH ESOPHAGEAL DILATION     "q time I have an EGD; usually q 3-4 years" (04/14/2015)   EXTRACORPOREAL SHOCK WAVE LITHOTRIPSY Left 12/30/2016   Procedure: LEFT EXTRACORPOREAL SHOCK WAVE LITHOTRIPSY (ESWL);  Surgeon: Carolan Clines, MD;  Location: [facility];  Service: Urology;  Laterality: Left;    LITHOTRIPSY  X 2   SQUAMOUS CELL CARCINOMA EXCISION     right forearm   TONSILLECTOMY  1971   TOTAL KNEE ARTHROPLASTY Left 03/21/2018   TOTAL KNEE ARTHROPLASTY Left 03/21/2018   Procedure: LEFT TOTAL KNEE ARTHROPLASTY;  Surgeon: Renette Butters, MD;  Location: [place];  Service: Orthopedics;  Laterality: Left;   UPPER GASTROINTESTINAL ENDOSCOPY     URETERAL STENT PLACEMENT     2019       Home Medications    Prior to Admission medications   Medication Sig Start Date End Date Taking? Authorizing Provider  aspirin EC 81 MG tablet Take 1 tablet (81 mg total) by mouth 2 (two) times daily. For DVT prophylaxis Patient taking differently: Take 81 mg by mouth daily. For DVT prophylaxis 03/21/18   Prudencio Burly III, PA-C  erythromycin ophthalmic ointment Place a 1/2 inch ribbon of ointment into the lower eyelid 4 times daily for 7 days. 06/21/21   Teodora Medici, FNP  fluticasone (FLONASE) 50 MCG/ACT nasal spray Place 2 sprays into both nostrils daily. Patient taking differently: Place 2 sprays into both nostrils as needed. 01/25/20   Tasia Catchings, Amy V, PA-C  ipratropium (ATROVENT) 0.06 % nasal spray Place 2 sprays into both nostrils 4 (four) times daily. Patient taking differently: Place 2 sprays into both nostrils as needed. 01/25/20   Tasia Catchings, Amy V, PA-C  loperamide (IMODIUM) 2 MG capsule Take 1 capsule (2 mg total) by mouth 2 (two) times daily as needed for diarrhea or loose stools. 04/19/21   Jaynee Eagles, PA-C  omeprazole (PRILOSEC) 40 MG capsule Take 1 capsule (40 mg total) by mouth daily. 03/19/21   Ladene Artist, MD  ondansetron (ZOFRAN-ODT) 8 MG disintegrating tablet Take 1 tablet (8 mg total) by mouth every 8 (eight) hours as needed for nausea or vomiting. 04/19/21   Jaynee Eagles, PA-C  oseltamivir (TAMIFLU) 75 MG capsule Take 1 capsule (75 mg total) by mouth every 12 (twelve) hours. 07/15/21   Francene Finders, PA-C  rosuvastatin (CRESTOR) 20 MG tablet TAKE 1 TABLET DAILY 05/28/21   Marin Olp, MD  tadalafil (CIALIS) 20 MG tablet TAKE ONE-HALF (1/2) TABLET DAILY AS NEEDED FOR ERECTILE DYSFUNCTION 01/07/21   Marin Olp, MD  traMADol (ULTRAM) 50 MG tablet Take 1-2 tablets (50-100 mg total) by mouth 2 (two) times daily as needed for severe pain (do not drive 8 hours after taking). 06/11/21   Marin Olp, MD    Family History Family History  Problem Relation Age of Onset   Alcohol abuse Father    Liver disease Father    Heart disease Brother        CABG   Colon cancer Neg Hx     Social History Social History   Tobacco Use   Smoking status: Former    Packs/day: 1.00    Years: 30.00    Pack years: 30.00    Types: Cigarettes  Smokeless tobacco: Never   Tobacco comments:    "quit smoking cigarettes in ~ 1990"  Vaping Use   Vaping Use: Never used  Substance Use Topics   Alcohol use: Yes    Comment: Beer-monthly per pt   Drug use: No     Allergies   Doxycycline and Lipitor [atorvastatin]   Review of Systems Review of Systems  Constitutional:  Negative for chills and fever.  Eyes:  Negative for discharge and redness.  Respiratory:  Negative for shortness of breath.   Neurological:  Positive for speech difficulty. Negative for headaches.  Psychiatric/Behavioral:  Positive for confusion.     Physical Exam Triage Vital Signs ED Triage Vitals [07/16/21 1019]  Enc Vitals Group     BP (!) 165/88     Pulse Rate 85     Resp 18     Temp 98.7 F (37.1 C)     Temp Source Oral     SpO2 93 %     Weight      Height      Head Circumference      Peak Flow      Pain Score      Pain Loc      Pain Edu?      Excl. in [place]?    No data found.  Updated Vital Signs BP (!) 165/88 (BP Location: Right Arm)   Pulse 85   Temp 98.7 F (37.1 C) (Oral)   Resp 18   SpO2 93%   Physical Exam Vitals and nursing note reviewed.  Constitutional:      Appearance: Normal appearance.  HENT:     Head: Normocephalic and atraumatic.  Eyes:      Conjunctiva/sclera: Conjunctivae normal.     Pupils: Pupils are equal, round, and reactive to light.  Cardiovascular:     Rate and Rhythm: Normal rate.  Pulmonary:     Effort: Pulmonary effort is normal.  Neurological:     Mental Status: He is alert and oriented to person, place, and time. He is confused.     Comments: Patient is unable to find words during a significant part of conversation but at other times will make complete sentences without difficulty. This is a major change from yesterday's visit. He is also observed having a difficult time using his own cell phone- cannot figure out how to make calls, hang up calls, etc.     UC Treatments / Results  Labs (all labs ordered are listed, but only abnormal results are displayed) Labs Reviewed - No data to display  EKG   Radiology No results found.  Procedures Procedures (including critical care time)  Medications Ordered in UC Medications - No data to display  Initial Impression / Assessment and Plan / UC Course  I have reviewed the triage vital signs and the nursing notes.  Pertinent labs & imaging results that were available during my care of the patient were reviewed by me and considered in my medical decision making (see chart for details).   Patient declines EMS evaluation and reports he will not go to the ED. I did discuss my concerns with daughter (April) over patient's phone and she agrees that he is not at his baseline mentation. She agrees to transport patient to ED but continues to refuse same. Eventually daughter is able to get him to her car for transport but it is unclear that patient understands his destination is to be the ED.   Final Clinical Impressions(s) / UC  Diagnoses   Final diagnoses:  None   Discharge Instructions   None    ED Prescriptions   None    PDMP not reviewed this encounter.   Francene Finders, PA-C 07/16/21 1131

## 2021-07-16 NOTE — ED Notes (Signed)
On Jose [name] assessment patient is confused, cannot get his words together, reports not remembering how to get to his daughters house this morning. Pt daughter called to be with patient and for more history. She is on the way .

## 2021-07-16 NOTE — ED Triage Notes (Signed)
Pt c/o chills and nausea. Was FLU(+) yesterday states these symptoms are new overnight. Asking for something to help with congestion.

## 2021-07-16 NOTE — ED Triage Notes (Signed)
Patient sent to [facility] from urgent care for evaluation of new altered mental status, specifically having difficulty finding words and having difficulty using his phone, compared to when he visited the same urgent care yesterday. Being treated for influenza with tamiflu. Patient alert, oriented, and in no apparent distress at this time.

## 2021-07-16 NOTE — Discharge Instructions (Addendum)
You were diagnosed with influenza today.  Symptoms will usually last 5 to 10 days for most people.  Your family was concerned that you are having episodes of confusion at home.  Please make sure that your wife or your daughter with you at all times for the next 5 days, or until you and they feel that you are back to normal.  You were lucid and coherent in the ER today, and your daughter and you wish to go home, and I felt this was reasonable.  We decided to start you on an antibiotic called Levaquin for possible pneumonia.  You will take this for the next 5 days.  I also prescribed Mucinex for cough and congestion, and Zofran for nausea.  Please remember to drink plenty of water at home.  You can take Tylenol and Motrin as needed for fevers and chills.  We talked about your oxygen level.  It fluctuated between 90 and 95% in the ER today.  Please consider buying an oxygen monitor or a pulse ox over-the-counter to keep at home.  If your oxygen level of or drops below 88%, or you feel you are having difficulty breathing, you need to come back to the ER immediately.  This can become a very dangerous condition.  I strongly recommend that you follow-up with your primary care provider within 3 days in the office for another recheck.   Most people are contagious with influenza for the first 7 days of symptoms.  You should continue quarantining at home until you reach the seventh day.

## 2021-07-16 NOTE — Telephone Encounter (Signed)
Per Wells Guiles, APP patient to present to ER, she has concerns for CVA.  Called to review with patient and left a voicemail.  Received a return call from patient's daughter and explained concerns.  She states patient is refusing to go to the ER.  Encouraged EMS call so they can evaluate and hopefully convince patient to go.  She verbalized understanding.  A few minutes later she returned call and states patient has agreed to go to the ER but that she was told if he did we could call and get him seen immediately.  Explained he will be triaged pretty quickly, but we have no control over wait times to see a provider.  She verbalized her frustration again and demanded I make a call to the ER.  Called report to triage RN Gerald Stabs to make her aware of patient's situation.

## 2021-07-16 NOTE — ED Triage Notes (Signed)
Patient here POV from Home with AMS.  Patient was recently diagnosed with the Flu yesterday and has been treated with the Tamiflu. Patient was still feeling ill and went to UC and was sent to [facility] for AMS and possible Stroke Symptoms. Patient left due to Wait.   Patient currently A&Ox4 but states earlier this AM he was confused.  NAD Noted during Triage. A&Ox4. GCS 15. BIB Wheelchair.

## 2021-08-11 DIAGNOSIS — E785 Hyperlipidemia, unspecified: Secondary | ICD-10-CM | POA: Diagnosis not present

## 2021-08-11 DIAGNOSIS — I2510 Atherosclerotic heart disease of native coronary artery without angina pectoris: Secondary | ICD-10-CM | POA: Diagnosis not present

## 2021-08-11 DIAGNOSIS — I10 Essential (primary) hypertension: Secondary | ICD-10-CM | POA: Diagnosis not present

## 2021-08-27 ENCOUNTER — Other Ambulatory Visit: Payer: Self-pay

## 2021-08-27 ENCOUNTER — Encounter: Payer: Self-pay | Admitting: Emergency Medicine

## 2021-08-27 ENCOUNTER — Ambulatory Visit
Admission: EM | Admit: 2021-08-27 | Discharge: 2021-08-27 | Disposition: A | Discharge status: Home / Self Care | Payer: Medicare Other | Attending: Physician Assistant | Admitting: Physician Assistant

## 2021-08-27 DIAGNOSIS — J069 Acute upper respiratory infection, unspecified: Secondary | ICD-10-CM | POA: Diagnosis not present

## 2021-08-27 MED ORDER — ONDANSETRON 4 MG PO TBDP: 4.0000 mg | ORAL_TABLET | Freq: Three times a day (TID) | ORAL | 0 refills | Status: DC | PRN

## 2021-08-27 MED ORDER — OSELTAMIVIR PHOSPHATE 75 MG PO CAPS: 75.0000 mg | ORAL_CAPSULE | Freq: Two times a day (BID) | ORAL | 0 refills | Status: DC

## 2021-08-27 NOTE — ED Provider Notes (Signed)
[facility]    CSN: [id number] Arrival date & time: 08/27/21  1752      History   Chief Complaint Chief Complaint  Patient presents with   Cough   Generalized Body Aches    HPI Jose [name] is a 80 y.o. male.   Patient here today for evaluation of body aches, chills, cough and congestion he has had the last 3 days. He is concerned he may have developed the flu again. HE did have a fever at home. He took an at home covid test that was negative.  The history is provided by the patient.   Past Medical History:  Diagnosis Date   Acute lower GI bleeding 01/25/2013; 09/2014   diverticular bleed   Acute prostatitis 02/21/2019   ANXIETY 08/22/2008   Arthritis    "a little bit" (04/14/2015)   Barrett's esophagus 2003   hematochezia as a result since 2003   Basal cell cancer    removed from right forearm.    Bradycardia 04/14/2015   CORONARY ARTERY DISEASE    2 stents   Depression    DIVERTICULOSIS, COLON 2003   ERECTILE DYSFUNCTION 08/22/2008   GERD (gastroesophageal reflux disease)    Heart murmur 04/14/2015   "they think I did; that's why I'm here" (04/14/2015)   History of kidney stones    HYPERLIPIDEMIA 02/09/2007   Kidney stones    "blasted or passed" (04/14/2015)   MI (myocardial infarction) ([place]) 2000   Numbness    right side - history of    OBESITY 03/20/2010   Squamous cell cancer of skin of earlobe    removed from area behind right earlobe   Stroke ([place])    4 yrs ago possible mini stroke -  tests never found anything    Patient Active Problem List   Diagnosis Date Noted   Vitamin B12 deficiency 12/17/2020   History of COVID-19.  Hospitalization November 2020 with acute respiratory failure. 07/09/2019   Aortic aneurysm ([place]) 06/04/2019   Fatty liver 06/04/2019   Aortic atherosclerosis ([place]) 06/04/2019   Acute prostatitis 02/21/2019   GERD (gastroesophageal reflux disease) 10/23/2018   Nasal congestion 10/23/2018   Primary osteoarthritis of  left knee 02/27/2018   Elevated PSA 08/03/2016   Benign prostatic hypertrophy (BPH) with nocturia 05/27/2016   Lumbosacral radiculopathy 06/17/2015   Numbness 06/17/2015   Right leg numbness    Sinus bradycardia 03/19/2015   Diverticulosis of colon with hemorrhage    Gastrointestinal hemorrhage 10/15/2014   Actinic keratosis 08/07/2014   Elevated blood pressure 03/29/2014   Hyperglycemia 02/26/2013   OBESITY 03/20/2010   Generalized anxiety disorder 08/22/2008   ERECTILE DYSFUNCTION 08/22/2008   Hyperlipidemia 02/09/2007   Atherosclerosis of coronary artery 02/09/2007   Barrett's esophagus 02/09/2007   Diverticulosis of large intestine 02/09/2007    Past Surgical History:  Procedure Laterality Date   BASAL CELL CARCINOMA EXCISION     posterior right ear.    CATARACT EXTRACTION, BILATERAL     COLONOSCOPY N/A 01/26/2013   Procedure: COLONOSCOPY;  Surgeon: Milus Banister, MD;  Location: [place];  Service: Endoscopy;  Laterality: N/A;   CORONARY ANGIOPLASTY WITH STENT PLACEMENT  09/06/1998   LAD stent per Dr Elonda Husky in [place]. 2 stents.    CYSTOSCOPY W/ URETERAL STENT PLACEMENT Left 12/21/2016   Procedure: CYSTOSCOPY WITH RETROGRADE PYELOGRAM/URETERAL STENT PLACEMENT;  Surgeon: Alexis Frock, MD;  [facility];  Service: Urology;  Laterality: Left;   ESOPHAGOGASTRODUODENOSCOPY (EGD) WITH ESOPHAGEAL DILATION     "  q time I have an EGD; usually q 3-4 years" (04/14/2015)   EXTRACORPOREAL SHOCK WAVE LITHOTRIPSY Left 12/30/2016   Procedure: LEFT EXTRACORPOREAL SHOCK WAVE LITHOTRIPSY (ESWL);  Surgeon: Carolan Clines, MD;  [facility];  Service: Urology;  Laterality: Left;   LITHOTRIPSY  X 2   SQUAMOUS CELL CARCINOMA EXCISION     right forearm   TONSILLECTOMY  1971   TOTAL KNEE ARTHROPLASTY Left 03/21/2018   TOTAL KNEE ARTHROPLASTY Left 03/21/2018   Procedure: LEFT TOTAL KNEE ARTHROPLASTY;  Surgeon: Renette Butters, MD;  Location: [place];  Service: Orthopedics;   Laterality: Left;   UPPER GASTROINTESTINAL ENDOSCOPY     URETERAL STENT PLACEMENT     2019       Home Medications    Prior to Admission medications   Medication Sig Start Date End Date Taking? Authorizing Provider  ondansetron (ZOFRAN-ODT) 4 MG disintegrating tablet Take 1 tablet (4 mg total) by mouth every 8 (eight) hours as needed. 08/27/21  Yes Francene Finders, PA-C  oseltamivir (TAMIFLU) 75 MG capsule Take 1 capsule (75 mg total) by mouth every 12 (twelve) hours. 08/27/21  Yes Francene Finders, PA-C  aspirin EC 81 MG tablet Take 1 tablet (81 mg total) by mouth 2 (two) times daily. For DVT prophylaxis Patient taking differently: Take 81 mg by mouth daily. For DVT prophylaxis 03/21/18   Prudencio Burly III, PA-C  erythromycin ophthalmic ointment Place a 1/2 inch ribbon of ointment into the lower eyelid 4 times daily for 7 days. 06/21/21   Teodora Medici, FNP  fluticasone (FLONASE) 50 MCG/ACT nasal spray Place 2 sprays into both nostrils daily. Patient taking differently: Place 2 sprays into both nostrils as needed. 01/25/20   Tasia Catchings, Amy V, PA-C  ipratropium (ATROVENT) 0.06 % nasal spray Place 2 sprays into both nostrils 4 (four) times daily. Patient taking differently: Place 2 sprays into both nostrils as needed. 01/25/20   Tasia Catchings, Amy V, PA-C  loperamide (IMODIUM) 2 MG capsule Take 1 capsule (2 mg total) by mouth 2 (two) times daily as needed for diarrhea or loose stools. 04/19/21   Jaynee Eagles, PA-C  omeprazole (PRILOSEC) 40 MG capsule Take 1 capsule (40 mg total) by mouth daily. 03/19/21   Ladene Artist, MD  rosuvastatin (CRESTOR) 20 MG tablet TAKE 1 TABLET DAILY 05/28/21   Marin Olp, MD  tadalafil (CIALIS) 20 MG tablet TAKE ONE-HALF (1/2) TABLET DAILY AS NEEDED FOR ERECTILE DYSFUNCTION 01/07/21   Marin Olp, MD  traMADol (ULTRAM) 50 MG tablet Take 1-2 tablets (50-100 mg total) by mouth 2 (two) times daily as needed for severe pain (do not drive 8 hours after taking).  06/11/21   Marin Olp, MD    Family History Family History  Problem Relation Age of Onset   Alcohol abuse Father    Liver disease Father    Heart disease Brother        CABG   Colon cancer Neg Hx     Social History Social History   Tobacco Use   Smoking status: Former    Packs/day: 1.00    Years: 30.00    Pack years: 30.00    Types: Cigarettes   Smokeless tobacco: Never   Tobacco comments:    "quit smoking cigarettes in ~ 1990"  Vaping Use   Vaping Use: Never used  Substance Use Topics   Alcohol use: Yes    Comment: Beer-monthly per pt   Drug use: No  Allergies   Doxycycline and Lipitor [atorvastatin]   Review of Systems Review of Systems  Constitutional:  Positive for chills. Negative for fever.  HENT:  Positive for congestion. Negative for sore throat.   Eyes:  Negative for discharge and redness.  Respiratory:  Positive for cough. Negative for shortness of breath.   Musculoskeletal:  Positive for myalgias.    Physical Exam Triage Vital Signs ED Triage Vitals  Enc Vitals Group     BP      Pulse      Resp      Temp      Temp src      SpO2      Weight      Height      Head Circumference      Peak Flow      Pain Score      Pain Loc      Pain Edu?      Excl. in [place]?    No data found.  Updated Vital Signs BP 135/80 (BP Location: Left Arm)    Pulse 71    Temp 98 F (36.7 C) (Oral)    Resp 16    SpO2 96%      Physical Exam Vitals and nursing note reviewed.  Constitutional:      General: He is not in acute distress.    Appearance: Normal appearance. He is not ill-appearing.  HENT:     Head: Normocephalic and atraumatic.     Nose: Congestion present.  Eyes:     Conjunctiva/sclera: Conjunctivae normal.  Cardiovascular:     Rate and Rhythm: Normal rate and regular rhythm.     Heart sounds: Normal heart sounds. No murmur heard. Pulmonary:     Effort: Pulmonary effort is normal. No respiratory distress.     Breath sounds: Normal  breath sounds. No wheezing, rhonchi or rales.  Skin:    General: Skin is warm and dry.  Neurological:     Mental Status: He is alert.  Psychiatric:        Mood and Affect: Mood normal.        Thought Content: Thought content normal.     UC Treatments / Results  Labs (all labs ordered are listed, but only abnormal results are displayed) Labs Reviewed  COVID-19, FLU A+B NAA    EKG   Radiology No results found.  Procedures Procedures (including critical care time)  Medications Ordered in UC Medications - No data to display  Initial Impression / Assessment and Plan / UC Course  I have reviewed the triage vital signs and the nursing notes.  Pertinent labs & imaging results that were available during my care of the patient were reviewed by me and considered in my medical decision making (see chart for details).   Screening ordered for covid and flu. Will send in tamiflu. Patient requests medication for nausea in case tamiflu upsets his stomach. Zofran prescribed as this has helped in the past. Will await results for further recommendation but encouraged sooner follow up with any further concerns or worsening symptoms.  Final Clinical Impressions(s) / UC Diagnoses   Final diagnoses:  Acute upper respiratory infection   Discharge Instructions   None    ED Prescriptions     Medication Sig Dispense Auth. Provider   oseltamivir (TAMIFLU) 75 MG capsule Take 1 capsule (75 mg total) by mouth every 12 (twelve) hours. 10 capsule Ewell Poe F, PA-C   ondansetron (ZOFRAN-ODT) 4 MG disintegrating tablet Take  1 tablet (4 mg total) by mouth every 8 (eight) hours as needed. 20 tablet Francene Finders, PA-C      PDMP not reviewed this encounter.   Francene Finders, PA-C 08/27/21 Einar Crow

## 2021-08-27 NOTE — ED Triage Notes (Signed)
Generalized body aches, chills, cough, nasal congestion x 3 days. Says his thermometer at home told him his fever was  up to 101. Took a covid at home test on Monday and it was negative.

## 2021-08-28 LAB — COVID-19, FLU A+B NAA
Influenza A, NAA: NOT DETECTED
Influenza B, NAA: NOT DETECTED
SARS-CoV-2, NAA: DETECTED — AB

## 2021-08-30 ENCOUNTER — Ambulatory Visit
Admission: EM | Admit: 2021-08-30 | Discharge: 2021-08-30 | Disposition: A | Discharge status: Home / Self Care | Payer: Medicare Other | Attending: Internal Medicine | Admitting: Internal Medicine

## 2021-08-30 ENCOUNTER — Other Ambulatory Visit: Payer: Self-pay

## 2021-08-30 ENCOUNTER — Ambulatory Visit (INDEPENDENT_AMBULATORY_CARE_PROVIDER_SITE_OTHER): Payer: Medicare Other

## 2021-08-30 DIAGNOSIS — R059 Cough, unspecified: Secondary | ICD-10-CM | POA: Diagnosis not present

## 2021-08-30 DIAGNOSIS — U071 COVID-19: Secondary | ICD-10-CM | POA: Diagnosis not present

## 2021-08-30 MED ORDER — BENZONATATE 100 MG PO CAPS: 100.0000 mg | ORAL_CAPSULE | Freq: Three times a day (TID) | ORAL | 0 refills | Status: DC | PRN

## 2021-08-30 MED ORDER — PREDNISONE 20 MG PO TABS: 40.0000 mg | ORAL_TABLET | Freq: Every day | ORAL | 0 refills | Status: AC

## 2021-08-30 NOTE — ED Provider Notes (Signed)
[name]    CSN: [id number] Arrival date & time: 08/30/21  1213      History   Chief Complaint Chief Complaint  Patient presents with   Shortness of Breath    HPI Jose [name] is a 81 y.o. male.   Patient presents today for further evaluation for nasal congestion and cough.  Patient presented on 08/27/2021 with similar symptoms.  Those symptoms had been present for approximately 3 days prior to recent urgent care visit.  His COVID-19 test at that time was positive.  He was prescribed Tamiflu due to suspicion of flu prior to getting COVID result.  He has been taking over-the-counter cold and flu medication with minimal improvement in symptoms.  Patient is concerned that he may develop pneumonia given as he had COVID in the past and developed pneumonia and had to be hospitalized.  Patient denies any chest pain, shortness of breath, sore throat, ear pain, nausea, vomiting, diarrhea, abdominal pain.  Denies any fevers.  Denies any history of COPD or asthma.  He denies shortness of breath but reported to the nurse during triage that he has shortness of breath with exertion.   Shortness of Breath  Past Medical History:  Diagnosis Date   Acute lower GI bleeding 01/25/2013; 09/2014   diverticular bleed   Acute prostatitis 02/21/2019   ANXIETY 08/22/2008   Arthritis    "a little bit" (04/14/2015)   Barrett's esophagus 2003   hematochezia as a result since 2003   Basal cell cancer    removed from right forearm.    Bradycardia 04/14/2015   CORONARY ARTERY DISEASE    2 stents   Depression    DIVERTICULOSIS, COLON 2003   ERECTILE DYSFUNCTION 08/22/2008   GERD (gastroesophageal reflux disease)    Heart murmur 04/14/2015   "they think I did; that's why I'm here" (04/14/2015)   History of kidney stones    HYPERLIPIDEMIA 02/09/2007   Kidney stones    "blasted or passed" (04/14/2015)   MI (myocardial infarction) ([place]) 2000   Numbness    right side - history of    OBESITY  03/20/2010   Squamous cell cancer of skin of earlobe    removed from area behind right earlobe   Stroke ([place])    4 yrs ago possible mini stroke -  tests never found anything    Patient Active Problem List   Diagnosis Date Noted   Vitamin B12 deficiency 12/17/2020   History of COVID-19.  Hospitalization November 2020 with acute respiratory failure. 07/09/2019   Aortic aneurysm ([place]) 06/04/2019   Fatty liver 06/04/2019   Aortic atherosclerosis ([place]) 06/04/2019   Acute prostatitis 02/21/2019   GERD (gastroesophageal reflux disease) 10/23/2018   Nasal congestion 10/23/2018   Primary osteoarthritis of left knee 02/27/2018   Elevated PSA 08/03/2016   Benign prostatic hypertrophy (BPH) with nocturia 05/27/2016   Lumbosacral radiculopathy 06/17/2015   Numbness 06/17/2015   Right leg numbness    Sinus bradycardia 03/19/2015   Diverticulosis of colon with hemorrhage    Gastrointestinal hemorrhage 10/15/2014   Actinic keratosis 08/07/2014   Elevated blood pressure 03/29/2014   Hyperglycemia 02/26/2013   OBESITY 03/20/2010   Generalized anxiety disorder 08/22/2008   ERECTILE DYSFUNCTION 08/22/2008   Hyperlipidemia 02/09/2007   Atherosclerosis of coronary artery 02/09/2007   Barrett's esophagus 02/09/2007   Diverticulosis of large intestine 02/09/2007    Past Surgical History:  Procedure Laterality Date   BASAL CELL CARCINOMA EXCISION     posterior right ear.  CATARACT EXTRACTION, BILATERAL     COLONOSCOPY N/A 01/26/2013   Procedure: COLONOSCOPY;  Surgeon: Milus Banister, MD;  Location: [facility];  Service: Endoscopy;  Laterality: N/A;   CORONARY ANGIOPLASTY WITH STENT PLACEMENT  09/06/1998   LAD stent per Dr Elonda Husky in [place]. 2 stents.    CYSTOSCOPY W/ URETERAL STENT PLACEMENT Left 12/21/2016   Procedure: CYSTOSCOPY WITH RETROGRADE PYELOGRAM/URETERAL STENT PLACEMENT;  Surgeon: Alexis Frock, MD;  Location: [facility];  Service: Urology;  Laterality: Left;    ESOPHAGOGASTRODUODENOSCOPY (EGD) WITH ESOPHAGEAL DILATION     "q time I have an EGD; usually q 3-4 years" (04/14/2015)   EXTRACORPOREAL SHOCK WAVE LITHOTRIPSY Left 12/30/2016   Procedure: LEFT EXTRACORPOREAL SHOCK WAVE LITHOTRIPSY (ESWL);  Surgeon: Carolan Clines, MD;  Location: [facility];  Service: Urology;  Laterality: Left;   LITHOTRIPSY  X 2   SQUAMOUS CELL CARCINOMA EXCISION     right forearm   TONSILLECTOMY  1971   TOTAL KNEE ARTHROPLASTY Left 03/21/2018   TOTAL KNEE ARTHROPLASTY Left 03/21/2018   Procedure: LEFT TOTAL KNEE ARTHROPLASTY;  Surgeon: Renette Butters, MD;  Location: [facility];  Service: Orthopedics;  Laterality: Left;   UPPER GASTROINTESTINAL ENDOSCOPY     URETERAL STENT PLACEMENT     2019       Home Medications    Prior to Admission medications   Medication Sig Start Date End Date Taking? Authorizing Provider  benzonatate (TESSALON) 100 MG capsule Take 1 capsule (100 mg total) by mouth every 8 (eight) hours as needed for cough. 08/30/21  Yes Yosselyn Tax, Hildred Alamin E, FNP  predniSONE (DELTASONE) 20 MG tablet Take 2 tablets (40 mg total) by mouth daily for 5 days. 08/30/21 09/04/21 Yes Teodora Medici, FNP  aspirin EC 81 MG tablet Take 1 tablet (81 mg total) by mouth 2 (two) times daily. For DVT prophylaxis Patient taking differently: Take 81 mg by mouth daily. For DVT prophylaxis 03/21/18   Prudencio Burly III, PA-C  erythromycin ophthalmic ointment Place a 1/2 inch ribbon of ointment into the lower eyelid 4 times daily for 7 days. 06/21/21   Teodora Medici, FNP  fluticasone (FLONASE) 50 MCG/ACT nasal spray Place 2 sprays into both nostrils daily. Patient taking differently: Place 2 sprays into both nostrils as needed. 01/25/20   Tasia Catchings, Amy V, PA-C  ipratropium (ATROVENT) 0.06 % nasal spray Place 2 sprays into both nostrils 4 (four) times daily. Patient taking differently: Place 2 sprays into both nostrils as needed. 01/25/20   Tasia Catchings, Amy V, PA-C  loperamide (IMODIUM) 2 MG capsule Take 1  capsule (2 mg total) by mouth 2 (two) times daily as needed for diarrhea or loose stools. 04/19/21   Jaynee Eagles, PA-C  omeprazole (PRILOSEC) 40 MG capsule Take 1 capsule (40 mg total) by mouth daily. 03/19/21   Ladene Artist, MD  ondansetron (ZOFRAN-ODT) 4 MG disintegrating tablet Take 1 tablet (4 mg total) by mouth every 8 (eight) hours as needed. 08/27/21   Francene Finders, PA-C  oseltamivir (TAMIFLU) 75 MG capsule Take 1 capsule (75 mg total) by mouth every 12 (twelve) hours. 08/27/21   Francene Finders, PA-C  rosuvastatin (CRESTOR) 20 MG tablet TAKE 1 TABLET DAILY 05/28/21   Marin Olp, MD  tadalafil (CIALIS) 20 MG tablet TAKE ONE-HALF (1/2) TABLET DAILY AS NEEDED FOR ERECTILE DYSFUNCTION 01/07/21   Marin Olp, MD  traMADol (ULTRAM) 50 MG tablet Take 1-2 tablets (50-100 mg total) by mouth 2 (two) times daily as needed  for severe pain (do not drive 8 hours after taking). 06/11/21   Marin Olp, MD    Family History Family History  Problem Relation Age of Onset   Alcohol abuse Father    Liver disease Father    Heart disease Brother        CABG   Colon cancer Neg Hx     Social History Social History   Tobacco Use   Smoking status: Former    Packs/day: 1.00    Years: 30.00    Pack years: 30.00    Types: Cigarettes   Smokeless tobacco: Never   Tobacco comments:    "quit smoking cigarettes in ~ 1990"  Vaping Use   Vaping Use: Never used  Substance Use Topics   Alcohol use: Yes    Comment: soccially   Drug use: No     Allergies   Doxycycline and Lipitor [atorvastatin]   Review of Systems Review of Systems Per HPI  Physical Exam Triage Vital Signs ED Triage Vitals  Enc Vitals Group     BP 08/30/21 1327 126/78     Pulse Rate 08/30/21 1327 (!) 57     Resp 08/30/21 1327 18     Temp 08/30/21 1327 97.6 F (36.4 C)     Temp Source 08/30/21 1327 Oral     SpO2 08/30/21 1327 96 %     Weight 08/30/21 1325 180 lb (81.6 kg)     Height 08/30/21 1325 5'  10" (1.778 m)     Head Circumference --      Peak Flow --      Pain Score 08/30/21 1325 0     Pain Loc --      Pain Edu? --      Excl. in [place]? --    No data found.  Updated Vital Signs BP 126/78 (BP Location: Right Arm)    Pulse (!) 57    Temp 97.6 F (36.4 C) (Oral)    Resp 18    Ht 5\' 10"  (1.778 m)    Wt 180 lb (81.6 kg)    SpO2 96%    BMI 25.83 kg/m   Visual Acuity Right Eye Distance:   Left Eye Distance:   Bilateral Distance:    Right Eye Near:   Left Eye Near:    Bilateral Near:     Physical Exam Constitutional:      General: He is not in acute distress.    Appearance: Normal appearance. He is not toxic-appearing or diaphoretic.  HENT:     Head: Normocephalic and atraumatic.     Right Ear: Tympanic membrane and ear canal normal.     Left Ear: Tympanic membrane and ear canal normal.     Nose: Congestion present.     Mouth/Throat:     Mouth: Mucous membranes are moist.     Pharynx: No posterior oropharyngeal erythema.  Eyes:     Extraocular Movements: Extraocular movements intact.     Conjunctiva/sclera: Conjunctivae normal.     Pupils: Pupils are equal, round, and reactive to light.  Cardiovascular:     Rate and Rhythm: Normal rate and regular rhythm.     Pulses: Normal pulses.     Heart sounds: Normal heart sounds.  Pulmonary:     Effort: Pulmonary effort is normal. No respiratory distress.     Breath sounds: Normal breath sounds. No stridor. No wheezing, rhonchi or rales.  Abdominal:     General: Abdomen is flat. Bowel sounds are  normal.     Palpations: Abdomen is soft.  Musculoskeletal:        General: Normal range of motion.     Cervical back: Normal range of motion.  Skin:    General: Skin is warm and dry.  Neurological:     General: No focal deficit present.     Mental Status: He is alert and oriented to person, place, and time. Mental status is at baseline.  Psychiatric:        Mood and Affect: Mood normal.        Behavior: Behavior normal.      UC Treatments / Results  Labs (all labs ordered are listed, but only abnormal results are displayed) Labs Reviewed - No data to display  EKG   Radiology DG Chest 2 View  Result Date: 08/30/2021 CLINICAL DATA:  Cough. EXAM: CHEST - 2 VIEW COMPARISON:  July 16, 2021. FINDINGS: The heart size and mediastinal contours are within normal limits. Both lungs are clear. The visualized skeletal structures are unremarkable. IMPRESSION: No active cardiopulmonary disease. Electronically Signed   By: Marijo Conception M.D.   On: 08/30/2021 13:54    Procedures Procedures (including critical care time)  Medications Ordered in UC Medications - No data to display  Initial Impression / Assessment and Plan / UC Course  I have reviewed the triage vital signs and the nursing notes.  Pertinent labs & imaging results that were available during my care of the patient were reviewed by me and considered in my medical decision making (see chart for details).     Patient has lingering symptoms from COVID-19.  Chest x-ray was negative for any acute cardiopulmonary process.  Patient was reassured that there are no signs of pneumonia at this time and lung sounds are clear.  Oxygen saturation is also normal.  Prednisone x5 days prescribed for patient to help alleviate symptoms.  Benzonatate also prescribed to help alleviate cough.  Discussed strict return precautions.  Patient verbalized understanding and was agreeable with plan. Final Clinical Impressions(s) / UC Diagnoses   Final diagnoses:  [id number]     Discharge Instructions      You have been prescribed a cough medication and prednisone to help alleviate your symptoms.  Please follow-up if symptoms persist or worsen.     ED Prescriptions     Medication Sig Dispense Auth. Provider   benzonatate (TESSALON) 100 MG capsule Take 1 capsule (100 mg total) by mouth every 8 (eight) hours as needed for cough. 21 capsule [place], [place] E, [place]    predniSONE (DELTASONE) 20 MG tablet Take 2 tablets (40 mg total) by mouth daily for 5 days. 10 tablet Teodora Medici, [place]      PDMP not reviewed this encounter.   Teodora Medici, [place] 08/30/21 1423

## 2021-08-30 NOTE — ED Triage Notes (Signed)
Pt presents with Covid pos test at home on Thursday. Pt st he has had this once before and ended up with pneumonia. Pt is sob after walking down the hallway and explaining symptoms to me. Cough present and, HA and congestion.

## 2021-08-30 NOTE — Discharge Instructions (Signed)
You have been prescribed a cough medication and prednisone to help alleviate your symptoms.  Please follow-up if symptoms persist or worsen.

## 2021-12-08 ENCOUNTER — Other Ambulatory Visit: Payer: Self-pay | Admitting: Family Medicine

## 2021-12-21 ENCOUNTER — Ambulatory Visit (INDEPENDENT_AMBULATORY_CARE_PROVIDER_SITE_OTHER): Payer: Medicare Other | Admitting: Family Medicine

## 2021-12-21 ENCOUNTER — Encounter: Payer: Self-pay | Admitting: Family Medicine

## 2021-12-21 VITALS — BP 112/72 | HR 64 | Temp 98.3°F | Ht 70.0 in | Wt 188.7 lb

## 2021-12-21 DIAGNOSIS — I2510 Atherosclerotic heart disease of native coronary artery without angina pectoris: Secondary | ICD-10-CM | POA: Diagnosis not present

## 2021-12-21 DIAGNOSIS — K760 Fatty (change of) liver, not elsewhere classified: Secondary | ICD-10-CM

## 2021-12-21 DIAGNOSIS — M1712 Unilateral primary osteoarthritis, left knee: Secondary | ICD-10-CM

## 2021-12-21 DIAGNOSIS — I714 Abdominal aortic aneurysm, without rupture, unspecified: Secondary | ICD-10-CM

## 2021-12-21 DIAGNOSIS — K2270 Barrett's esophagus without dysplasia: Secondary | ICD-10-CM | POA: Diagnosis not present

## 2021-12-21 DIAGNOSIS — E785 Hyperlipidemia, unspecified: Secondary | ICD-10-CM | POA: Diagnosis not present

## 2021-12-21 DIAGNOSIS — R739 Hyperglycemia, unspecified: Secondary | ICD-10-CM

## 2021-12-21 MED ORDER — TRAMADOL HCL 50 MG PO TABS: 50.0000 mg | ORAL_TABLET | Freq: Two times a day (BID) | ORAL | 5 refills | Status: DC | PRN

## 2021-12-21 MED ORDER — TADALAFIL 20 MG PO TABS: ORAL_TABLET | ORAL | 17 refills | Status: DC

## 2021-12-21 NOTE — Patient Instructions (Addendum)
Please stop by lab before you go ?If you have mychart- we will send your results within 3 business days of [place] receiving them.  ?If you do not have mychart- we will call you about results within 5 business days of [place] receiving them.  ?*please also note that you will see labs on mychart as soon as they post. I will later go in and write notes on them- will say "notes from Dr. Yong Channel"  ? ?Recommended follow up: Return in about 6 months (around 06/22/2022) for followup or sooner if needed.Schedule b4 you leave. ?

## 2021-12-21 NOTE — Progress Notes (Signed)
?Phone [phone] ?In person visit ?  ?Subjective:  ? ?Jose [name] is a 81 y.o. year old very pleasant male patient who presents for/with See problem oriented charting ?Chief Complaint  ?Patient presents with  ? Follow-up  ? Hyperlipidemia  ? ?This visit occurred during the SARS-CoV-2 public health emergency.  Safety protocols were in place, including screening questions prior to the visit, additional usage of staff PPE, and extensive cleaning of exam room while observing appropriate contact time as indicated for disinfecting solutions.  ? ?Past Medical History-  ?Patient Active Problem List  ? Diagnosis Date Noted  ? History of COVID-19.  Hospitalization November 2020 with acute respiratory failure. 07/09/2019  ?  Priority: High  ? Aortic aneurysm ([place]) 06/04/2019  ?  Priority: High  ? CAD (coronary artery disease) 02/09/2007  ?  Priority: High  ? Barrett's esophagus 02/09/2007  ?  Priority: High  ? Vitamin B12 deficiency 12/17/2020  ?  Priority: Medium   ? Fatty liver 06/04/2019  ?  Priority: Medium   ? GERD (gastroesophageal reflux disease) 10/23/2018  ?  Priority: Medium   ? Primary osteoarthritis of left knee 02/27/2018  ?  Priority: Medium   ? Elevated PSA 08/03/2016  ?  Priority: Medium   ? Benign prostatic hypertrophy (BPH) with nocturia 05/27/2016  ?  Priority: Medium   ? Hyperglycemia 02/26/2013  ?  Priority: Medium   ? Hyperlipidemia 02/09/2007  ?  Priority: Medium   ? Aortic atherosclerosis ([place]) 06/04/2019  ?  Priority: Low  ? Acute prostatitis 02/21/2019  ?  Priority: Low  ? Nasal congestion 10/23/2018  ?  Priority: Low  ? Lumbosacral radiculopathy 06/17/2015  ?  Priority: Low  ? Numbness 06/17/2015  ?  Priority: Low  ? Right leg numbness   ?  Priority: Low  ? Sinus bradycardia 03/19/2015  ?  Priority: Low  ? Diverticulosis of colon with hemorrhage   ?  Priority: Low  ? Actinic keratosis 08/07/2014  ?  Priority: Low  ? Elevated blood pressure 03/29/2014  ?  Priority: Low  ? OBESITY 03/20/2010   ?  Priority: Low  ? Generalized anxiety disorder 08/22/2008  ?  Priority: Low  ? ERECTILE DYSFUNCTION 08/22/2008  ?  Priority: Low  ? Diverticulosis of large intestine 02/09/2007  ?  Priority: Low  ? Gastrointestinal hemorrhage 10/15/2014  ? ? ?Medications- reviewed and updated ?Current Outpatient Medications  ?Medication Sig Dispense Refill  ? aspirin EC 81 MG tablet Take 1 tablet (81 mg total) by mouth 2 (two) times daily. For DVT prophylaxis (Patient taking differently: Take 81 mg by mouth daily. For DVT prophylaxis) 60 tablet 0  ? erythromycin ophthalmic ointment Place a 1/2 inch ribbon of ointment into the lower eyelid 4 times daily for 7 days. 3.5 g 0  ? fluticasone (FLONASE) 50 MCG/ACT nasal spray Place 2 sprays into both nostrils daily. (Patient taking differently: Place 2 sprays into both nostrils as needed.) 1 g 0  ? ipratropium (ATROVENT) 0.06 % nasal spray Place 2 sprays into both nostrils 4 (four) times daily. (Patient taking differently: Place 2 sprays into both nostrils as needed.) 15 mL 0  ? loperamide (IMODIUM) 2 MG capsule Take 1 capsule (2 mg total) by mouth 2 (two) times daily as needed for diarrhea or loose stools. 14 capsule 0  ? omeprazole (PRILOSEC) 40 MG capsule Take 1 capsule (40 mg total) by mouth daily. 90 capsule 3  ? rosuvastatin (CRESTOR) 20 MG tablet TAKE 1 TABLET DAILY  90 tablet 3  ? tadalafil (CIALIS) 20 MG tablet TAKE ONE-HALF (1/2) TABLET DAILY AS NEEDED FOR ERECTILE DYSFUNCTION 8 tablet 17  ? traMADol (ULTRAM) 50 MG tablet Take 1-2 tablets (50-100 mg total) by mouth 2 (two) times daily as needed for severe pain (do not drive 8 hours after taking). 60 tablet 5  ? ?No current facility-administered medications for this visit.  ? ?  ?Objective:  ?BP 112/72   Pulse 64   Temp 98.3 ?F (36.8 ?C)   Ht '5\' 10"'$  (1.778 m)   Wt 188 lb 11.2 oz (85.6 kg)   SpO2 96%   BMI 27.08 kg/m?  ?Gen: NAD, resting comfortably ?CV: RRR no murmurs rubs or gallops ?Lungs: CTAB no crackles, wheeze,  rhonchi ?Ext: no edema ?Skin: warm, dry ? ?  ? ?Assessment and Plan  ? ?#Tough respiratory Winter-patient had 6 emergency room or urgent care visits in the last 6 months with several being for respiratory issues including influenza and COVID-19 ?- also dealing with some allergies- usually does zyrtec few days a week ? ?#Hyperglycemia-peak A1c 5.8 ?s: Diet controlled in the past- weight has been trending up since July though  ?Lab Results  ?Component Value Date  ? HGBA1C 5.6 06/11/2021  ? HGBA1C 5.5 06/11/2020  ? HGBA1C 5.7 12/03/2019  ?A/P: Hopefully stable-update A1c with labs today-continue without medicine for now ?  ?#CAD-followed in [facility]- remains on aspirin 81 mg ?#Hyperlipidemia  ?s: Well controlled on rosuvastatin 20 mg.  Ideal LDL under 70 ?-No chest pain or shortness of breath reported  ?Lab Results  ?Component Value Date  ? CHOL 110 12/17/2020  ? HDL 41.50 12/17/2020  ? [place] 59 12/17/2020  ? LDLDIRECT 100.0 12/19/2017  ? TRIG 46.0 12/17/2020  ? CHOLHDL 3 12/17/2020  ?  A/P: CAD asymptomatic-continue current medicationincluding statin and aspirin ?Hyperlipidemia at goal on last check-due for lipid panel at this time-ordered today  ?  ?#History of left knee replacement-with continued pain in the left knee despite this ?S: patient using 1 tramadol in the morning and 1 in the evening for his knees.  We received a letter from [facility] in 2020 requesting that we begin his long-term tramadol prescription. ?A/P:Overall stable-continue current medication-refill up to 6 months from today- sent in ? ?#GERD/Barrett's esophagus ?S: doing well on omeprazole '40mg'$  per Dr. Fuller Plan. Only issue he gets is some phlegm clearing- discussed could also be allergy related.  Most recent endoscopy 03/19/2021 with repeat planned in 3 years ?A/P: reasonable control- continue current meds- will be due 20215 for repeat EGD for Barretts esophagus  ? ?# Fatty liver ?S:  Noted on CT 04/20/2019.   ?-Weight up slightly from last  visit 3 pounds ? A/P: hopefully stable. Update LFTs- Encouraged need for healthy eating, regular exercise, weight loss- had been doing good job with this but plateau ing some  ? ?# Aortic aneurysm ?S: 3 cm aneurysm noted on ct 04/20/2019- 3 year repeat US planned.  ?A/P: Suspect stable-continue risk factor modification and repeat at next visit ? ?#Elevated PSA-following up with urology yearly at least.Last visit I have on file was 07/18/2019- he agrees to call to schedule this ? ?#some sleep issues- discussed melatonin over relaxium. Also advised no daytime naps after 3 pm ? ?#recommended general dermatology referral- he declines for now ? ?Recommended follow up: Return in about 6 months (around 06/22/2022) for followup or sooner if needed.Schedule b4 you leave. ?Future Appointments  ?Date Time Provider [place]  ?02/11/2022  8:00 AM [facility]  ? ?Lab/Order associations: hot dog for lunch and onion rings ?  ICD-10-CM   ?1. Hyperlipidemia, unspecified hyperlipidemia type  E78.5 CBC with Differential/Platelet  ?  Comprehensive metabolic panel  ?  Lipid panel  ?  ?2. Hyperglycemia  R73.9 Hemoglobin A1c  ?  ?3. Coronary artery disease involving native coronary artery of native heart without angina pectoris  I25.10   ?  ?4. Abdominal aortic aneurysm (AAA) without rupture, unspecified part ([place]) Chronic I71.40   ?  ?5. Barrett's esophagus without dysplasia  K22.70   ?  ?6. Primary osteoarthritis of left knee  M17.12   ?  ?7. Fatty liver  K76.0   ?  ? ? ?Meds ordered this encounter  ?Medications  ? traMADol (ULTRAM) 50 MG tablet  ?  Sig: Take 1-2 tablets (50-100 mg total) by mouth 2 (two) times daily as needed for severe pain (do not drive 8 hours after taking).  ?  Dispense:  60 tablet  ?  Refill:  5  ?  This request is for a new prescription for a controlled substance as required by Federal/State law..  ? tadalafil (CIALIS) 20 MG tablet  ?  Sig: TAKE ONE-HALF (1/2) TABLET DAILY AS NEEDED  FOR ERECTILE DYSFUNCTION  ?  Dispense:  8 tablet  ?  Refill:  17  ? ? ?Return precautions advised.  ?[name], MD ? ?

## 2021-12-22 LAB — LIPID PANEL
Cholesterol: 112 mg/dL (ref 0–200)
HDL: 41.8 mg/dL (ref >39.00)
LDL Cholesterol: 53 mg/dL (ref 0–99)
NonHDL: 70.47
Total CHOL/HDL Ratio: 3
Triglycerides: 87 mg/dL (ref 0.0–149.0)
VLDL: 17.4 mg/dL (ref 0.0–40.0)

## 2021-12-22 LAB — CBC WITH DIFFERENTIAL/PLATELET
Basophils Absolute: 0 10*3/uL (ref 0.0–0.1)
Basophils Relative: 0.7 % (ref 0.0–3.0)
Eosinophils Absolute: 0.1 10*3/uL (ref 0.0–0.7)
Eosinophils Relative: 2.9 % (ref 0.0–5.0)
HCT: 43.3 % (ref 39.0–52.0)
Hemoglobin: 14.6 g/dL (ref 13.0–17.0)
Lymphocytes Relative: 25.1 % (ref 12.0–46.0)
Lymphs Abs: 1.1 10*3/uL (ref 0.7–4.0)
MCHC: 33.8 g/dL (ref 30.0–36.0)
MCV: 86.2 fl (ref 78.0–100.0)
Monocytes Absolute: 0.9 10*3/uL (ref 0.1–1.0)
Monocytes Relative: 19.5 % — ABNORMAL HIGH (ref 3.0–12.0)
Neutro Abs: 2.3 10*3/uL (ref 1.4–7.7)
Neutrophils Relative %: 51.8 % (ref 43.0–77.0)
Platelets: 151 10*3/uL (ref 150.0–400.0)
RBC: 5.02 Mil/uL (ref 4.22–5.81)
RDW: 14.4 % (ref 11.5–15.5)
WBC: 4.4 10*3/uL (ref 4.0–10.5)

## 2021-12-22 LAB — COMPREHENSIVE METABOLIC PANEL
ALT: 13 U/L (ref 0–53)
AST: 16 U/L (ref 0–37)
Albumin: 4 g/dL (ref 3.5–5.2)
Alkaline Phosphatase: 81 U/L (ref 39–117)
BUN: 12 mg/dL (ref 6–23)
CO2: 28 mEq/L (ref 19–32)
Calcium: 9.1 mg/dL (ref 8.4–10.5)
Chloride: 105 mEq/L (ref 96–112)
Creatinine, Ser: 1.07 mg/dL (ref 0.40–1.50)
GFR: 66.55 mL/min (ref >60.00)
Glucose, Bld: 96 mg/dL (ref 70–99)
Potassium: 4.1 mEq/L (ref 3.5–5.1)
Sodium: 140 mEq/L (ref 135–145)
Total Bilirubin: 0.6 mg/dL (ref 0.2–1.2)
Total Protein: 6.3 g/dL (ref 6.0–8.3)

## 2021-12-22 LAB — HEMOGLOBIN A1C: Hgb A1c MFr Bld: 5.7 % (ref 4.6–6.5)

## 2022-02-11 ENCOUNTER — Ambulatory Visit: Payer: Medicare Other

## 2022-02-12 ENCOUNTER — Ambulatory Visit (INDEPENDENT_AMBULATORY_CARE_PROVIDER_SITE_OTHER): Payer: Medicare Other

## 2022-02-12 DIAGNOSIS — Z0000 Encounter for general adult medical examination without abnormal findings: Secondary | ICD-10-CM

## 2022-02-12 NOTE — Patient Instructions (Signed)
Jose [name] , Thank you for taking time to come for your Medicare Wellness Visit. I appreciate your ongoing commitment to your health goals. Please review the following plan we discussed and let me know if I can assist you in the future.   Screening recommendations/referrals: Colonoscopy: No longer required  Recommended yearly ophthalmology/optometry visit for glaucoma screening and checkup Recommended yearly dental visit for hygiene and checkup  Vaccinations: Influenza vaccine: due and discussed  Pneumococcal vaccine: due and discussed  Tdap vaccine: due and discussed  Shingles vaccine: Shingrix discussed. Please contact your pharmacy for coverage information.    Covid-19: Completed 2/21,  11/13/19  Advanced directives: Advance directive discussed with you today. Even though you declined this today please call our office should you change your mind and we can give you the proper paperwork for you to fill out.  Conditions/risks identified: None at this time   Next appointment: Follow up in one year for your annual wellness visit.   Preventive Care 79 Years and Older, Male Preventive care refers to lifestyle choices and visits with your health care provider that can promote health and wellness. What does preventive care include? A yearly physical exam. This is also called an annual well check. Dental exams once or twice a year. Routine eye exams. Ask your health care provider how often you should have your eyes checked. Personal lifestyle choices, including: Daily care of your teeth and gums. Regular physical activity. Eating a healthy diet. Avoiding tobacco and drug use. Limiting alcohol use. Practicing safe sex. Taking low doses of aspirin every day. Taking vitamin and mineral supplements as recommended by your health care provider. What happens during an annual well check? The services and screenings done by your health care provider during your annual well check will depend on  your age, overall health, lifestyle risk factors, and family history of disease. Counseling  Your health care provider may ask you questions about your: Alcohol use. Tobacco use. Drug use. Emotional well-being. Home and relationship well-being. Sexual activity. Eating habits. History of falls. Memory and ability to understand (cognition). Work and work Statistician. Screening  You may have the following tests or measurements: Height, weight, and BMI. Blood pressure. Lipid and cholesterol levels. These may be checked every 5 years, or more frequently if you are over 79 years old. Skin check. Lung cancer screening. You may have this screening every year starting at age 79 if you have a 30-pack-year history of smoking and currently smoke or have quit within the past 15 years. Fecal occult blood test (FOBT) of the stool. You may have this test every year starting at age 79. Flexible sigmoidoscopy or colonoscopy. You may have a sigmoidoscopy every 5 years or a colonoscopy every 10 years starting at age 79. Prostate cancer screening. Recommendations will vary depending on your family history and other risks. Hepatitis C blood test. Hepatitis B blood test. Sexually transmitted disease (STD) testing. Diabetes screening. This is done by checking your blood sugar (glucose) after you have not eaten for a while (fasting). You may have this done every 1-3 years. Abdominal aortic aneurysm (AAA) screening. You may need this if you are a current or former smoker. Osteoporosis. You may be screened starting at age 79 if you are at high risk. Talk with your health care provider about your test results, treatment options, and if necessary, the need for more tests. Vaccines  Your health care provider may recommend certain vaccines, such as: Influenza vaccine. This is recommended every year.  Tetanus, diphtheria, and acellular pertussis (Tdap, Td) vaccine. You may need a Td booster every 10 years. Zoster  vaccine. You may need this after age 55. Pneumococcal 13-valent conjugate (PCV13) vaccine. One dose is recommended after age 79. Pneumococcal polysaccharide (PPSV23) vaccine. One dose is recommended after age 79 Talk to your health care provider about which screenings and vaccines you need and how often you need them. This information is not intended to replace advice given to you by your health care provider. Make sure you discuss any questions you have with your health care provider. Document Released: 09/12/2015 Document Revised: 05/05/2016 Document Reviewed: 06/17/2015 Elsevier Interactive Patient Education  2017 [employer] Prevention in the Home Falls can cause injuries. They can happen to people of all ages. There are many things you can do to make your home safe and to help prevent falls. What can I do on the outside of my home? Regularly fix the edges of walkways and driveways and fix any cracks. Remove anything that might make you trip as you walk through a door, such as a raised step or threshold. Trim any bushes or trees on the path to your home. Use bright outdoor lighting. Clear any walking paths of anything that might make someone trip, such as rocks or tools. Regularly check to see if handrails are loose or broken. Make sure that both sides of any steps have handrails. Any raised decks and porches should have guardrails on the edges. Have any leaves, snow, or ice cleared regularly. Use sand or salt on walking paths during winter. Clean up any spills in your garage right away. This includes oil or grease spills. What can I do in the bathroom? Use night lights. Install grab bars by the toilet and in the tub and shower. Do not use towel bars as grab bars. Use non-skid mats or decals in the tub or shower. If you need to sit down in the shower, use a plastic, non-slip stool. Keep the floor dry. Clean up any water that spills on the floor as soon as it happens. Remove  soap buildup in the tub or shower regularly. Attach bath mats securely with double-sided non-slip rug tape. Do not have throw rugs and other things on the floor that can make you trip. What can I do in the bedroom? Use night lights. Make sure that you have a light by your bed that is easy to reach. Do not use any sheets or blankets that are too big for your bed. They should not hang down onto the floor. Have a firm chair that has side arms. You can use this for support while you get dressed. Do not have throw rugs and other things on the floor that can make you trip. What can I do in the kitchen? Clean up any spills right away. Avoid walking on wet floors. Keep items that you use a lot in easy-to-reach places. If you need to reach something above you, use a strong step stool that has a grab bar. Keep electrical cords out of the way. Do not use floor polish or wax that makes floors slippery. If you must use wax, use non-skid floor wax. Do not have throw rugs and other things on the floor that can make you trip. What can I do with my stairs? Do not leave any items on the stairs. Make sure that there are handrails on both sides of the stairs and use them. Fix handrails that are broken or loose.  Make sure that handrails are as long as the stairways. Check any carpeting to make sure that it is firmly attached to the stairs. Fix any carpet that is loose or worn. Avoid having throw rugs at the top or bottom of the stairs. If you do have throw rugs, attach them to the floor with carpet tape. Make sure that you have a light switch at the top of the stairs and the bottom of the stairs. If you do not have them, ask someone to add them for you. What else can I do to help prevent falls? Wear shoes that: Do not have high heels. Have rubber bottoms. Are comfortable and fit you well. Are closed at the toe. Do not wear sandals. If you use a stepladder: Make sure that it is fully opened. Do not climb a  closed stepladder. Make sure that both sides of the stepladder are locked into place. Ask someone to hold it for you, if possible. Clearly mark and make sure that you can see: Any grab bars or handrails. First and last steps. Where the edge of each step is. Use tools that help you move around (mobility aids) if they are needed. These include: Canes. Walkers. Scooters. Crutches. Turn on the lights when you go into a dark area. Replace any light bulbs as soon as they burn out. Set up your furniture so you have a clear path. Avoid moving your furniture around. If any of your floors are uneven, fix them. If there are any pets around you, be aware of where they are. Review your medicines with your doctor. Some medicines can make you feel dizzy. This can increase your chance of falling. Ask your doctor what other things that you can do to help prevent falls. This information is not intended to replace advice given to you by your health care provider. Make sure you discuss any questions you have with your health care provider. Document Released: 06/12/2009 Document Revised: 01/22/2016 Document Reviewed: 09/20/2014 Elsevier Interactive Patient Education  2017 [employer].

## 2022-02-12 NOTE — Progress Notes (Signed)
Virtual Visit via Telephone Note  I connected with  Jose [name] on 02/12/22 at  8:00 AM EDT by telephone and verified that I am speaking with the correct person using two identifiers.  Medicare Annual Wellness visit completed telephonically due to Covid-19 pandemic.   Persons participating in this call: This Health Coach and this patient.   Location: Patient: Home Provider: Office    I discussed the limitations, risks, security and privacy concerns of performing an evaluation and management service by telephone and the availability of in person appointments. The patient expressed understanding and agreed to proceed.  Unable to perform video visit due to video visit attempted and failed and/or patient does not have video capability.   Some vital signs may be absent or patient reported.   Jose Brace, LPN   Subjective:   Jose [name] is a 81 y.o. male who presents for Medicare Annual/Subsequent preventive examination.  Review of Systems     Cardiac Risk Factors include: advanced age (>42mn, >>49women);dyslipidemia;male gender     Objective:    There were no vitals filed for this visit. There is no height or weight on file to calculate BMI.     02/12/2022    8:05 AM 07/16/2021    3:26 PM 02/06/2021    8:10 AM 12/03/2019    1:45 PM 07/10/2019    2:37 PM 07/07/2019    9:43 AM 09/06/2018    2:39 PM  Advanced Directives  Does Patient Have a Medical Advance Directive? No No No Yes No No No  Type of AScientist, research (medical)Living will     Does patient want to make changes to medical advance directive?    No - Patient declined     Copy of [place] Chart?    No - copy requested     Would patient like information on creating a medical advance directive? No - Patient declined No - Patient declined No - Patient declined  No - Patient declined No - Patient declined No - Patient declined    Current Medications  (verified) Outpatient Encounter Medications as of 02/12/2022  Medication Sig   aspirin EC 81 MG tablet Take 1 tablet (81 mg total) by mouth 2 (two) times daily. For DVT prophylaxis (Patient taking differently: Take 81 mg by mouth daily. For DVT prophylaxis)   loperamide (IMODIUM) 2 MG capsule Take 1 capsule (2 mg total) by mouth 2 (two) times daily as needed for diarrhea or loose stools.   omeprazole (PRILOSEC) 40 MG capsule Take 1 capsule (40 mg total) by mouth daily.   rosuvastatin (CRESTOR) 20 MG tablet TAKE 1 TABLET DAILY   tadalafil (CIALIS) 20 MG tablet TAKE ONE-HALF (1/2) TABLET DAILY AS NEEDED FOR ERECTILE DYSFUNCTION   traMADol (ULTRAM) 50 MG tablet Take 1-2 tablets (50-100 mg total) by mouth 2 (two) times daily as needed for severe pain (do not drive 8 hours after taking).   [DISCONTINUED] erythromycin ophthalmic ointment Place a 1/2 inch ribbon of ointment into the lower eyelid 4 times daily for 7 days.   [DISCONTINUED] fluticasone (FLONASE) 50 MCG/ACT nasal spray Place 2 sprays into both nostrils daily.   [DISCONTINUED] ipratropium (ATROVENT) 0.06 % nasal spray Place 2 sprays into both nostrils 4 (four) times daily.   No facility-administered encounter medications on file as of 02/12/2022.    Allergies (verified) Doxycycline and Lipitor [atorvastatin]   History: Past Medical History:  Diagnosis Date  Acute lower GI bleeding 01/25/2013; 09/2014   diverticular bleed   Acute prostatitis 02/21/2019   ANXIETY 08/22/2008   Arthritis    "a little bit" (04/14/2015)   Barrett's esophagus 2003   hematochezia as a result since 2003   Basal cell cancer    removed from right forearm.    Bradycardia 04/14/2015   CORONARY ARTERY DISEASE    2 stents   Depression    DIVERTICULOSIS, COLON 2003   ERECTILE DYSFUNCTION 08/22/2008   GERD (gastroesophageal reflux disease)    Heart murmur 04/14/2015   "they think I did; that's why I'm here" (04/14/2015)   History of kidney stones     HYPERLIPIDEMIA 02/09/2007   Kidney stones    "blasted or passed" (04/14/2015)   MI (myocardial infarction) ([place]) 2000   Numbness    right side - history of    OBESITY 03/20/2010   Squamous cell cancer of skin of earlobe    removed from area behind right earlobe   Stroke ([place])    4 yrs ago possible mini stroke -  tests never found anything   Past Surgical History:  Procedure Laterality Date   BASAL CELL CARCINOMA EXCISION     posterior right ear.    CATARACT EXTRACTION, BILATERAL     COLONOSCOPY N/A 01/26/2013   Procedure: COLONOSCOPY;  Surgeon: Milus Banister, MD;  Location: [place];  Service: Endoscopy;  Laterality: N/A;   CORONARY ANGIOPLASTY WITH STENT PLACEMENT  09/06/1998   LAD stent per Dr Elonda Husky in [place]. 2 stents.    CYSTOSCOPY W/ URETERAL STENT PLACEMENT Left 12/21/2016   Procedure: CYSTOSCOPY WITH RETROGRADE PYELOGRAM/URETERAL STENT PLACEMENT;  Surgeon: Alexis Frock, MD;  Location: [facility];  Service: Urology;  Laterality: Left;   ESOPHAGOGASTRODUODENOSCOPY (EGD) WITH ESOPHAGEAL DILATION     "q time I have an EGD; usually q 3-4 years" (04/14/2015)   EXTRACORPOREAL SHOCK WAVE LITHOTRIPSY Left 12/30/2016   Procedure: LEFT EXTRACORPOREAL SHOCK WAVE LITHOTRIPSY (ESWL);  Surgeon: Carolan Clines, MD;  Location: [facility];  Service: Urology;  Laterality: Left;   LITHOTRIPSY  X 2   SQUAMOUS CELL CARCINOMA EXCISION     right forearm   TONSILLECTOMY  1971   TOTAL KNEE ARTHROPLASTY Left 03/21/2018   TOTAL KNEE ARTHROPLASTY Left 03/21/2018   Procedure: LEFT TOTAL KNEE ARTHROPLASTY;  Surgeon: Renette Butters, MD;  Location: [facility];  Service: Orthopedics;  Laterality: Left;   UPPER GASTROINTESTINAL ENDOSCOPY     URETERAL STENT PLACEMENT     2019   Family History  Problem Relation Age of Onset   Alcohol abuse Father    Liver disease Father    Heart disease Brother        CABG   Colon cancer Neg Hx    Social History   Socioeconomic History   Marital status: Married     Spouse name: Not on file   Number of children: Not on file   Years of education: Not on file   Highest education level: Not on file  Occupational History   Not on file  Tobacco Use   Smoking status: Former    Packs/day: 1.00    Years: 30.00    Total pack years: 30.00    Types: Cigarettes   Smokeless tobacco: Never   Tobacco comments:    "quit smoking cigarettes in ~ 1990"  Vaping Use   Vaping Use: Never used  Substance and Sexual Activity   Alcohol use: Yes    Comment: soccially  Drug use: No   Sexual activity: Not on file  Other Topics Concern   Not on file  Social History Narrative   Still working at [employer]   Married 35 years in 2015, 4 kids from first marriage, 3 grandkids   Hobbies: woodworking, tv movies- old action movies like Rambo   Social Determinants of Health   Financial Resource Strain: [place]  (02/12/2022)   Overall Financial Resource Strain (CARDIA)    Difficulty of Paying Living Expenses: Not hard at all  Food Insecurity: No Food Insecurity (02/12/2022)   Hunger Vital Sign    Worried About Running Out of Food in the Last Year: Never true    [place] in the Last Year: Never true  Transportation Needs: No Transportation Needs (02/12/2022)   PRAPARE - Hydrologist (Medical): No    Lack of Transportation (Non-Medical): No  Physical Activity: Inactive (02/12/2022)   Exercise Vital Sign    Days of Exercise per Week: 0 days    Minutes of Exercise per Session: 0 min  Stress: No Stress Concern Present (02/12/2022)   [place]    Feeling of Stress : Only a little  Social Connections: Moderately Isolated (02/12/2022)   Social Connection and Isolation Panel [NHANES]    Frequency of Communication with Friends and Family: More than three times a week    Frequency of Social Gatherings with Friends and Family: More than three times a week    Attends  Religious Services: Never    Marine scientist or Organizations: No    Attends Music therapist: Never    Marital Status: Married    Tobacco Counseling Counseling given: Not Answered Tobacco comments: "quit smoking cigarettes in ~ 1990"   Clinical Intake:  Pre-visit preparation completed: Yes  Pain : No/denies pain     BMI - recorded: 27.08 Nutritional Status: BMI 25 -29 Overweight Nutritional Risks: None Diabetes: No  How often do you need to have someone help you when you read instructions, pamphlets, or other written materials from your doctor or pharmacy?: 1 - Never  Diabetic?no  Interpreter Needed?: No  Information entered by :: Charlott Rakes, LPN   Activities of Daily Living    02/12/2022    8:07 AM  In your present state of health, do you have any difficulty performing the following activities:  Hearing? 0  Vision? 0  Difficulty concentrating or making decisions? 0  Walking or climbing stairs? 0  Dressing or bathing? 0  Doing errands, shopping? 0  Preparing Food and eating ? N  Using the Toilet? N  In the past six months, have you accidently leaked urine? N  Do you have problems with loss of bowel control? N  Managing your Medications? N  Managing your Finances? N  Housekeeping or managing your Housekeeping? N    Patient Care Team: Marin Olp, MD as PCP - General (Family Medicine) [facility], [facility] Ladene Artist, MD as Consulting Physician (Gastroenterology) Talbert Cage, NP as Nurse Practitioner (Cardiology)  Indicate any recent Medical Services you may have received from other than [facility] providers in the past year (date may be approximate).     Assessment:   This is a routine wellness examination for [place].  Hearing/Vision screen Hearing Screening - Comments:: Pt wears hearing aids  Vision Screening - Comments:: Pt follows up with Dr Katy Fitch   Dietary issues and  exercise activities  discussed: Current Exercise Habits: The patient has a physically strenuous job, but has no regular exercise apart from work.   Goals Addressed             This Visit's Progress    Patient Stated       None at this time        Depression Screen    02/12/2022    8:04 AM 06/11/2021    2:56 PM 02/06/2021    8:09 AM 12/10/2020    3:01 PM 12/03/2019    1:46 PM 08/02/2019    4:01 PM 07/17/2019   11:46 AM  PHQ 2/9 Scores  PHQ - 2 Score 0 0 0 0 0 0 0  PHQ- 9 Score    0       Fall Risk    02/12/2022    8:07 AM 06/11/2021    2:55 PM 02/06/2021    8:12 AM 12/10/2020    3:01 PM 12/03/2019    1:46 PM  [place] in the past year? 0 0 0 0 0  Number falls in past yr: 0 0 0 0 0  Injury with Fall? 0 0 0 0 0  Risk for fall due to : Impaired vision No Fall Risks Impaired vision    Follow up Falls prevention discussed Falls evaluation completed Falls prevention discussed  Falls evaluation completed;Education provided;Falls prevention discussed    FALL RISK PREVENTION PERTAINING TO THE HOME:  Any stairs in or around the home? Yes  If so, are there any without handrails? No  Home free of loose throw rugs in walkways, pet beds, electrical cords, etc? Yes  Adequate lighting in your home to reduce risk of falls? Yes   ASSISTIVE DEVICES UTILIZED TO PREVENT FALLS:  Life alert? No  Use of a cane, walker or w/c? No  Grab bars in the bathroom? Yes  Shower chair or bench in shower? Yes  Elevated toilet seat or a handicapped toilet? No   TIMED UP AND GO:  Was the test performed? No .   Cognitive Function:        02/12/2022    8:08 AM 02/06/2021    8:15 AM 12/03/2019    1:46 PM  6CIT Screen  What Year? 0 points 0 points 0 points  What month? 0 points 0 points 0 points  What time? 0 points 0 points 0 points  Count back from 20 0 points 0 points 0 points  Months in reverse 0 points 0 points 0 points  Repeat phrase 0 points 0 points 0 points  Total Score 0 points 0 points 0 points     Immunizations Immunization History  Administered Date(s) Administered   Fluad Quad(high Dose 65+) 06/04/2019   Influenza Split 08/23/2011   Influenza, High Dose Seasonal PF 08/03/2016, 09/05/2017, 06/20/2018, 08/29/2020   Influenza,inj,Quad PF,6+ Mos 08/07/2014   PFIZER(Purple Top)SARS-COV-2 Vaccination 10/21/2019, 11/13/2019   Pneumococcal Polysaccharide-23 08/22/2008   Td 08/31/2003    TDAP status: Due, Education has been provided regarding the importance of this vaccine. Advised may receive this vaccine at local pharmacy or Health Dept. Aware to provide a copy of the vaccination record if obtained from local pharmacy or Health Dept. Verbalized acceptance and understanding.  Flu Vaccine status: Due, Education has been provided regarding the importance of this vaccine. Advised may receive this vaccine at local pharmacy or Health Dept. Aware to provide a copy of the vaccination record if obtained from local pharmacy or  Health Dept. Verbalized acceptance and understanding.  Pneumococcal vaccine status: Due, Education has been provided regarding the importance of this vaccine. Advised may receive this vaccine at local pharmacy or Health Dept. Aware to provide a copy of the vaccination record if obtained from local pharmacy or Health Dept. Verbalized acceptance and understanding.  Covid-19 vaccine status: Completed vaccines  Qualifies for Shingles Vaccine? Yes   Zostavax completed No   Shingrix Completed?: No.    Education has been provided regarding the importance of this vaccine. Patient has been advised to call insurance company to determine out of pocket expense if they have not yet received this vaccine. Advised may also receive vaccine at local pharmacy or Health Dept. Verbalized acceptance and understanding.  Screening Tests Health Maintenance  Topic Date Due   Zoster Vaccines- Shingrix (1 of 2) 03/22/2022 (Originally 08/21/1960)   TETANUS/TDAP  06/11/2022 (Originally 08/30/2013)    Pneumonia Vaccine 14+ Years old (2 - PCV) 12/22/2022 (Originally 08/22/2009)   INFLUENZA VACCINE  03/30/2022   Hepatitis C Screening  Completed   HPV VACCINES  Aged Out   COLONOSCOPY (Pts 45-67yr Insurance coverage will need to be confirmed)  Discontinued   COVID-19 Vaccine  Discontinued    Health Maintenance  There are no preventive care reminders to display for this patient.  Colorectal cancer screening: No longer required.    Additional Screening:  Hepatitis C Screening: Completed 06/11/20  Vision Screening: Recommended annual ophthalmology exams for early detection of glaucoma and other disorders of the eye. Is the patient up to date with their annual eye exam?  No  Who is the provider or what is the name of the office in which the patient attends annual eye exams? Dr GKaty Fitch If pt is not established with a provider, would they like to be referred to a provider to establish care? No .   Dental Screening: Recommended annual dental exams for proper oral hygiene  Community Resource Referral / Chronic Care Management: CRR required this visit?  No   CCM required this visit?  No      Plan:     I have personally reviewed and noted the following in the patient's chart:   Medical and social history Use of alcohol, tobacco or illicit drugs  Current medications and supplements including opioid prescriptions. Patient is currently taking opioid prescriptions. Information provided to patient regarding non-opioid alternatives. Patient advised to discuss non-opioid treatment plan with their provider. Functional ability and status Nutritional status Physical activity Advanced directives List of other physicians Hospitalizations, surgeries, and ER visits in previous 12 months Vitals Screenings to include cognitive, depression, and falls Referrals and appointments  In addition, I have reviewed and discussed with patient certain preventive protocols, quality metrics, and best  practice recommendations. A written personalized care plan for preventive services as well as general preventive health recommendations were provided to patient.     TWillette Brace LPN   [id number]  Nurse Notes: None

## 2022-02-16 DIAGNOSIS — I10 Essential (primary) hypertension: Secondary | ICD-10-CM | POA: Diagnosis not present

## 2022-02-16 DIAGNOSIS — I2510 Atherosclerotic heart disease of native coronary artery without angina pectoris: Secondary | ICD-10-CM | POA: Diagnosis not present

## 2022-02-16 DIAGNOSIS — E785 Hyperlipidemia, unspecified: Secondary | ICD-10-CM | POA: Diagnosis not present

## 2022-02-16 DIAGNOSIS — Z955 Presence of coronary angioplasty implant and graft: Secondary | ICD-10-CM | POA: Diagnosis not present

## 2022-04-13 ENCOUNTER — Other Ambulatory Visit: Payer: Self-pay | Admitting: Gastroenterology

## 2022-05-07 ENCOUNTER — Telehealth: Payer: Self-pay | Admitting: Gastroenterology

## 2022-05-07 MED ORDER — OMEPRAZOLE 40 MG PO CPDR: 40.0000 mg | DELAYED_RELEASE_CAPSULE | Freq: Every day | ORAL | 0 refills | Status: DC

## 2022-05-07 NOTE — Telephone Encounter (Signed)
Prescription for omeprazole sent to patient's pharmacy until scheduled appt. Informed patient and patient verbalized understanding.

## 2022-05-07 NOTE — Telephone Encounter (Signed)
Pt is requesting a refill of omeprazole, has appt sch'd in October, he is requesting a call back if this can be done today. Thank you

## 2022-05-24 ENCOUNTER — Encounter: Payer: Self-pay | Admitting: *Deleted

## 2022-06-02 DIAGNOSIS — R972 Elevated prostate specific antigen [PSA]: Secondary | ICD-10-CM | POA: Diagnosis not present

## 2022-06-07 DIAGNOSIS — N410 Acute prostatitis: Secondary | ICD-10-CM | POA: Diagnosis not present

## 2022-06-07 DIAGNOSIS — N5201 Erectile dysfunction due to arterial insufficiency: Secondary | ICD-10-CM | POA: Diagnosis not present

## 2022-06-22 ENCOUNTER — Encounter: Payer: Self-pay | Admitting: Family Medicine

## 2022-06-22 ENCOUNTER — Ambulatory Visit (INDEPENDENT_AMBULATORY_CARE_PROVIDER_SITE_OTHER): Payer: Medicare Other | Admitting: Family Medicine

## 2022-06-22 VITALS — BP 128/74 | HR 64 | Temp 98.3°F | Ht 70.0 in | Wt 195.0 lb

## 2022-06-22 DIAGNOSIS — Z23 Encounter for immunization: Secondary | ICD-10-CM

## 2022-06-22 DIAGNOSIS — E538 Deficiency of other specified B group vitamins: Secondary | ICD-10-CM | POA: Diagnosis not present

## 2022-06-22 DIAGNOSIS — R739 Hyperglycemia, unspecified: Secondary | ICD-10-CM | POA: Diagnosis not present

## 2022-06-22 DIAGNOSIS — Z1283 Encounter for screening for malignant neoplasm of skin: Secondary | ICD-10-CM | POA: Diagnosis not present

## 2022-06-22 DIAGNOSIS — I2510 Atherosclerotic heart disease of native coronary artery without angina pectoris: Secondary | ICD-10-CM | POA: Diagnosis not present

## 2022-06-22 DIAGNOSIS — E785 Hyperlipidemia, unspecified: Secondary | ICD-10-CM

## 2022-06-22 DIAGNOSIS — I714 Abdominal aortic aneurysm, without rupture, unspecified: Secondary | ICD-10-CM | POA: Diagnosis not present

## 2022-06-22 DIAGNOSIS — Z79899 Other long term (current) drug therapy: Secondary | ICD-10-CM | POA: Diagnosis not present

## 2022-06-22 MED ORDER — TRAMADOL HCL 50 MG PO TABS: 50.0000 mg | ORAL_TABLET | Freq: Two times a day (BID) | ORAL | 5 refills | Status: DC | PRN

## 2022-06-22 MED ORDER — TADALAFIL 20 MG PO TABS: ORAL_TABLET | ORAL | 17 refills | Status: DC

## 2022-06-22 MED ORDER — ROSUVASTATIN CALCIUM 20 MG PO TABS: 20.0000 mg | ORAL_TABLET | Freq: Every day | ORAL | 3 refills | Status: DC

## 2022-06-22 NOTE — Progress Notes (Signed)
Phone [phone] In person visit   Subjective:   Jose [name] is a 81 y.o. year old very pleasant male patient who presents for/with See problem oriented charting Chief Complaint  Patient presents with   Hyperlipidemia    6 month follow up   Past Medical History-  Patient Active Problem List   Diagnosis Date Noted   History of COVID-19.  Hospitalization November 2020 with acute respiratory failure. 07/09/2019    Priority: High   Aortic aneurysm ([place]) 06/04/2019    Priority: High   CAD (coronary artery disease) 02/09/2007    Priority: High   Barrett's esophagus 02/09/2007    Priority: High   Vitamin B12 deficiency 12/17/2020    Priority: Medium    Fatty liver 06/04/2019    Priority: Medium    GERD (gastroesophageal reflux disease) 10/23/2018    Priority: Medium    Primary osteoarthritis of left knee 02/27/2018    Priority: Medium    Elevated PSA 08/03/2016    Priority: Medium    Benign prostatic hypertrophy (BPH) with nocturia 05/27/2016    Priority: Medium    Hyperglycemia 02/26/2013    Priority: Medium    Hyperlipidemia 02/09/2007    Priority: Medium    Aortic atherosclerosis ([place]) 06/04/2019    Priority: Low   Acute prostatitis 02/21/2019    Priority: Low   Nasal congestion 10/23/2018    Priority: Low   Lumbosacral radiculopathy 06/17/2015    Priority: Low   Numbness 06/17/2015    Priority: Low   Right leg numbness     Priority: Low   Sinus bradycardia 03/19/2015    Priority: Low   Diverticulosis of colon with hemorrhage     Priority: Low   Actinic keratosis 08/07/2014    Priority: Low   Elevated blood pressure 03/29/2014    Priority: Low   OBESITY 03/20/2010    Priority: Low   Generalized anxiety disorder 08/22/2008    Priority: Low   ERECTILE DYSFUNCTION 08/22/2008    Priority: Low   Diverticulosis of large intestine 02/09/2007    Priority: Low   Gastrointestinal hemorrhage 10/15/2014    Medications- reviewed and updated Current  Outpatient Medications  Medication Sig Dispense Refill   aspirin EC 81 MG tablet Take 1 tablet (81 mg total) by mouth 2 (two) times daily. For DVT prophylaxis (Patient taking differently: Take 81 mg by mouth daily. For DVT prophylaxis) 60 tablet 0   loperamide (IMODIUM) 2 MG capsule Take 1 capsule (2 mg total) by mouth 2 (two) times daily as needed for diarrhea or loose stools. 14 capsule 0   omeprazole (PRILOSEC) 40 MG capsule Take 1 capsule (40 mg total) by mouth daily. 90 capsule 0   rosuvastatin (CRESTOR) 20 MG tablet Take 1 tablet (20 mg total) by mouth daily. 90 tablet 3   tadalafil (CIALIS) 20 MG tablet TAKE ONE-HALF (1/2) TABLET DAILY AS NEEDED FOR ERECTILE DYSFUNCTION 8 tablet 17   traMADol (ULTRAM) 50 MG tablet Take 1-2 tablets (50-100 mg total) by mouth 2 (two) times daily as needed for severe pain (do not drive 8 hours after taking). 60 tablet 5   No current facility-administered medications for this visit.     Objective:  BP 128/74 (BP Location: Left Arm, Patient Position: Sitting, Cuff Size: Large)   Pulse 64   Temp 98.3 F (36.8 C) (Temporal)   Ht 5' 10" (1.778 m)   Wt 195 lb (88.5 kg)   SpO2 96%   BMI 27.98 kg/m  Gen: NAD,  resting comfortably CV: RRR no murmurs rubs or gallops Lungs: CTAB no crackles, wheeze, rhonchi Ext: trace edema Skin: warm, dry    Assessment and Plan   #social update- lost wife september 2023 to aortic aneurysm- known aneurysm but had opted out of treatment from 8 years prior due to potential for complications  #Hyperglycemia-peak A1c 5.8 s: Diet controlled in the past   Lab Results  Component Value Date   HGBA1C 5.7 12/21/2021   HGBA1C 5.6 06/11/2021   HGBA1C 5.5 06/11/2020  A/P: hopefully stable- update a1c today. Continue current meds for now  #CAD-[facility]ed in [facility]- remains on aspirin 81 mg #Hyperlipidemia  Ideal LDL under 70 s: Medication: Rosuvastatin 20 mg.  No chest pain or SOB Lab Results  Component Value Date   CHOL  112 12/21/2021   HDL 41.80 12/21/2021   LDLCALC 53 12/21/2021   LDLDIRECT 100.0 12/19/2017   TRIG 87.0 12/21/2021   CHOLHDL 3 12/21/2021    A/P: CAD asymptomatic-continue current medication-has had recent follow-up with cardiology - Lipids have been well controlled with LDL under 70-continue rosuvastatin 20 mg   #Chronic knee pain on left despite knee replacement S: History of left knee replacement-with continued pain in the left knee despite this patient using 1 tramadol in the morning and 1 in the evening for his knees.  We received a letter from [facility] in 2020 requesting that we begin his long-term tramadol prescription.  He has done well on this medication-reports overall stable pain level/tolerable on twice a day A/P:Controlled. Continue current medications.  -pdmp reviewed- low risk   #GERD/Barrett's esophagus S: doing well on omeprazole 17m per Dr. sFuller Plan Only issue he gets is some phlegm clearing- discussed could also be allergy related- no major concern Lab Results  Component Value Date   VITAMINB12 685 06/11/2021   A/P: Reflux is well controlled-continue current medication -last endoscopy 2022 with 3 year repeat  # B12 deficiency S: Current treatment/medication (oral vs. IM): 1000 mcg sublingual  Lab Results  Component Value Date   VITAMINB12 685 06/11/2021   A/P: Des take B12 supplement we will check B12 but suspect adequately repleted-discussed does not need more than 1000 mcg a day  # Fatty liver S:  Noted on CT 04/20/2019. Some weight gain with loss of wife    Latest Ref Rng & Units 12/21/2021    3:56 PM 07/16/2021    3:28 PM 06/11/2021    4:08 PM  Hepatic Function  Total Protein 6.0 - 8.3 g/dL 6.3  7.1  6.4   Albumin 3.5 - 5.2 g/dL 4.0  4.1  4.0   AST 0 - 37 U/L _0 ALT 0 - 53 U/L _1 Alk Phosphatase 39 - 117 U/L 81  66  69   Total Bilirubin 0.2 - 1.2 mg/dL 0.6  1.4  0.7    A/P: weight up after losing weight- Encouraged need for  healthy eating, regular exercise, weight loss.  =update LFTs   # Aortic aneurysm S: 3.3 cm aneurysm noted on ct 04/20/2019- 3 year [place].  A/P: Now due for repeat-update with ultrasound AAA duplex   #Elevated PSA-following up with urology yearly at least. Was around 5.5 in august and apparently came down November 2020 around 4 - planned yearly follow up but I do not have a recent assessment on file- Dr. HLouis Meckelsaw him last week reports psa down to 2.7- we will try to  get records- he was told he was released Lab Results  Component Value Date   PSA 2.93 03/16/2018   PSA 4.25 (H) 12/19/2017   PSA 3.07 07/28/2016   #skin cancer screening- refer today for general screening  Recommended follow up: Return in about 6 months (around 12/22/2022) for followup or sooner if needed.Schedule b4 you leave. Future Appointments  Date Time Provider Department Center  06/29/2022  9:30 AM Stark, Malcolm T, MD [facility]  02/18/2023  8:00 AM [facility] HEALTH COACH [facility]   Lab/Order associations:   ICD-10-CM   1. Abdominal aortic aneurysm (AAA) without rupture, unspecified part (HCC)  I71.40 VAS US AAA DUPLEX    2. Need for immunization against influenza  Z23 Flu Vaccine QUAD High Dose(Fluad)    3. High risk medication use  Z79.899 Vitamin B12    4. Hyperglycemia  R73.9 HgB A1c    5. Coronary artery disease involving native coronary artery of native heart without angina pectoris  I25.10     6. Vitamin B12 deficiency  E53.8 Vitamin B12    7. Hyperlipidemia, unspecified hyperlipidemia type  E78.5 Comprehensive metabolic panel    8. Screening exam for skin cancer  Z12.83 Ambulatory referral to Dermatology      Meds ordered this encounter  Medications   rosuvastatin (CRESTOR) 20 MG tablet    Sig: Take 1 tablet (20 mg total) by mouth daily.    Dispense:  90 tablet    Refill:  3   tadalafil (CIALIS) 20 MG tablet    Sig: TAKE ONE-HALF (1/2) TABLET DAILY AS NEEDED FOR  ERECTILE DYSFUNCTION    Dispense:  8 tablet    Refill:  17   traMADol (ULTRAM) 50 MG tablet    Sig: Take 1-2 tablets (50-100 mg total) by mouth 2 (two) times daily as needed for severe pain (do not drive 8 hours after taking).    Dispense:  60 tablet    Refill:  5    This request is for a new prescription for a controlled substance as required by Federal/State law..    Return precautions advised.  Stephen Hunter, MD  

## 2022-06-22 NOTE — Patient Instructions (Addendum)
Health Maintenance Due  Topic Date Due   Zoster Vaccines- Shingrix (1 of 2)- Please check with your pharmacy to see if they have the shingrix vaccine. If they do- please get this immunization and update [place] by phone call or mychart with dates you receive the vaccine  Never done   TETANUS/TDAP - consider at pharmacy especially if get cut/scrape 08/30/2013   Thanks for doing flu shot  We will call you within two weeks about your referral to dermatology. If you do not hear within 2 weeks, give [place] a call.   Please stop by lab before you go If you have mychart- we will send your results within 3 business days of [place] receiving them.  If you do not have mychart- we will call you about results within 5 business days of [place] receiving them.  *please also note that you will see labs on mychart as soon as they post. I will later go in and write notes on them- will say "notes from Dr. Yong Channel"   Sign release of information at the check out desk for last visit with [facility]- I would like to have a copy  Recommended follow up: Return in about 6 months (around 12/22/2022) for followup or sooner if needed.Schedule b4 you leave.

## 2022-06-23 LAB — COMPREHENSIVE METABOLIC PANEL
ALT: 14 U/L (ref 0–53)
AST: 17 U/L (ref 0–37)
Albumin: 3.8 g/dL (ref 3.5–5.2)
Alkaline Phosphatase: 62 U/L (ref 39–117)
BUN: 13 mg/dL (ref 6–23)
CO2: 30 mEq/L (ref 19–32)
Calcium: 9.4 mg/dL (ref 8.4–10.5)
Chloride: 107 mEq/L (ref 96–112)
Creatinine, Ser: 1.12 mg/dL (ref 0.40–1.50)
GFR: 62.78 mL/min (ref >60.00)
Glucose, Bld: 65 mg/dL — ABNORMAL LOW (ref 70–99)
Potassium: 4.1 mEq/L (ref 3.5–5.1)
Sodium: 141 mEq/L (ref 135–145)
Total Bilirubin: 0.6 mg/dL (ref 0.2–1.2)
Total Protein: 6.4 g/dL (ref 6.0–8.3)

## 2022-06-23 LAB — HEMOGLOBIN A1C: Hgb A1c MFr Bld: 5.7 % (ref 4.6–6.5)

## 2022-06-23 LAB — VITAMIN B12: Vitamin B-12: >1500 pg/mL — ABNORMAL HIGH (ref 211–911)

## 2022-06-29 ENCOUNTER — Ambulatory Visit (INDEPENDENT_AMBULATORY_CARE_PROVIDER_SITE_OTHER): Payer: Medicare Other | Admitting: Gastroenterology

## 2022-06-29 ENCOUNTER — Encounter: Payer: Self-pay | Admitting: Gastroenterology

## 2022-06-29 VITALS — BP 112/64 | HR 54 | Ht 69.5 in | Wt 193.8 lb

## 2022-06-29 DIAGNOSIS — K2270 Barrett's esophagus without dysplasia: Secondary | ICD-10-CM | POA: Diagnosis not present

## 2022-06-29 DIAGNOSIS — I2510 Atherosclerotic heart disease of native coronary artery without angina pectoris: Secondary | ICD-10-CM

## 2022-06-29 MED ORDER — OMEPRAZOLE 40 MG PO CPDR: 40.0000 mg | DELAYED_RELEASE_CAPSULE | Freq: Every day | ORAL | 3 refills | Status: DC

## 2022-06-29 NOTE — Progress Notes (Signed)
    Assessment     GERD, long segment (4 cm) Barrett's esophagus without dysplasia   Recommendations    Continue omeprazole 40 mg daily and follow antireflux measures Surveillance endoscopy in July 2025.  We discussed his age and surveillance. he would like to proceed with future surveillance exams   HPI    This is a 81 year old male returning for follow-up of GERD with short segment Barrett's esophagus without dysplasia.  He relates his reflux symptoms are well controlled on daily omeprazole.  He has no dysphagia.  No other gastrointestinal complaints.  He relates his daughter had esophageal cancer related to Barrett's esophagus and underwent an esophagectomy with gastric pull-up and eventually had metastatic disease.  He relates his wife passed away last month from a ruptured aortic aneurysm.  EGD July 2022  - Esophageal mucosal changes secondary to established long-segment (4 cm) Barrett's. Biopsied. - No endoscopic esophageal abnormality to explain patient's dysphagia. Dilated. - Small hiatal hernia. - Normal duodenal bulb and second portion of the duodenum.    Labs / Imaging       Latest Ref Rng & Units 06/22/2022    3:57 PM 12/21/2021    3:56 PM 07/16/2021    3:28 PM  Hepatic Function  Total Protein 6.0 - 8.3 g/dL 6.4  6.3  7.1   Albumin 3.5 - 5.2 g/dL 3.8  4.0  4.1   AST 0 - 37 U/L _0 ALT 0 - 53 U/L _1 Alk Phosphatase 39 - 117 U/L 62  81  66   Total Bilirubin 0.2 - 1.2 mg/dL 0.6  0.6  1.4        Latest Ref Rng & Units 12/21/2021    3:56 PM 07/16/2021    3:28 PM 06/11/2021    4:08 PM  CBC  WBC 4.0 - 10.5 K/uL 4.4  10.2  4.7   Hemoglobin 13.0 - 17.0 g/dL 14.6  14.8  14.3   Hematocrit 39.0 - 52.0 % 43.3  44.1  42.4   Platelets 150.0 - 400.0 K/uL 151.0  116  160.0     Current Medications, Allergies, Past Medical History, Past Surgical History, Family History and Social History were reviewed in [employer]  record.   Physical Exam: General: Well developed, well nourished, no acute distress Head: Normocephalic and atraumatic Eyes: Sclerae anicteric, EOMI Ears: Normal auditory acuity Mouth: Not examined Lungs: Clear throughout to auscultation Heart: Regular rate and rhythm; no murmurs, rubs or bruits Abdomen: Soft, non tender and non distended. No masses, hepatosplenomegaly or hernias noted. Normal Bowel sounds Rectal: Not done Musculoskeletal: Symmetrical with no gross deformities  Pulses:  Normal pulses noted Extremities: No clubbing, cyanosis, edema or deformities noted Neurological: Alert oriented x 4, grossly nonfocal Psychological:  Alert and cooperative. Normal mood and affect   Garion Wempe T. Fuller Plan, MD 06/29/2022, 9:35 AM

## 2022-06-29 NOTE — Patient Instructions (Signed)
We have sent the following medications to your pharmacy for you to pick up at your convenience: omeprazole.  ? ?The [facility] providers would like to encourage you to use MYCHART to communicate with providers for non-urgent requests or questions.  Due to long hold times on the telephone, sending your provider a message by MYCHART may be a faster and more efficient way to get a response.  Please allow 48 business hours for a response.  Please remember that this is for non-urgent requests.  ? ?Thank you for choosing me and [facility]. ? ?Malcolm T. Stark, Jr., MD., FACG ? ? ?

## 2022-07-07 ENCOUNTER — Ambulatory Visit (HOSPITAL_COMMUNITY)
Admission: RE | Admit: 2022-07-07 | Discharge: 2022-07-07 | Disposition: A | Discharge status: Home / Self Care | Payer: Medicare Other | Source: Ambulatory Visit | Attending: Family Medicine | Admitting: Family Medicine

## 2022-07-07 DIAGNOSIS — I714 Abdominal aortic aneurysm, without rupture, unspecified: Secondary | ICD-10-CM | POA: Insufficient documentation

## 2022-07-08 ENCOUNTER — Other Ambulatory Visit: Payer: Self-pay

## 2022-07-08 MED ORDER — ROSUVASTATIN CALCIUM 20 MG PO TABS: 20.0000 mg | ORAL_TABLET | Freq: Every day | ORAL | 3 refills | Status: DC

## 2022-07-08 MED ORDER — TADALAFIL 20 MG PO TABS: ORAL_TABLET | ORAL | 17 refills | Status: DC

## 2022-07-08 MED ORDER — TADALAFIL 20 MG PO TABS: ORAL_TABLET | ORAL | 3 refills | Status: DC

## 2022-07-29 NOTE — Telephone Encounter (Signed)
PA for Tadalafil 10 mg approved effective 06/21/2022-07/21/2023.

## 2022-08-27 ENCOUNTER — Emergency Department (HOSPITAL_BASED_OUTPATIENT_CLINIC_OR_DEPARTMENT_OTHER)
Admission: EM | Admit: 2022-08-27 | Discharge: 2022-08-27 | Disposition: A | Discharge status: Home / Self Care | Payer: Medicare Other | Attending: Emergency Medicine | Admitting: Emergency Medicine

## 2022-08-27 ENCOUNTER — Other Ambulatory Visit: Payer: Self-pay

## 2022-08-27 DIAGNOSIS — R112 Nausea with vomiting, unspecified: Secondary | ICD-10-CM | POA: Diagnosis not present

## 2022-08-27 DIAGNOSIS — Z7982 Long term (current) use of aspirin: Secondary | ICD-10-CM | POA: Insufficient documentation

## 2022-08-27 DIAGNOSIS — Z1152 Encounter for screening for COVID-19: Secondary | ICD-10-CM | POA: Insufficient documentation

## 2022-08-27 DIAGNOSIS — E860 Dehydration: Secondary | ICD-10-CM | POA: Insufficient documentation

## 2022-08-27 DIAGNOSIS — Z20822 Contact with and (suspected) exposure to covid-19: Secondary | ICD-10-CM | POA: Insufficient documentation

## 2022-08-27 DIAGNOSIS — R1110 Vomiting, unspecified: Secondary | ICD-10-CM | POA: Diagnosis present

## 2022-08-27 LAB — COMPREHENSIVE METABOLIC PANEL
ALT: 21 U/L (ref 0–44)
AST: 22 U/L (ref 15–41)
Albumin: 4 g/dL (ref 3.5–5.0)
Alkaline Phosphatase: 82 U/L (ref 38–126)
Anion gap: 8 (ref 5–15)
BUN: 23 mg/dL (ref 8–23)
CO2: 19 mmol/L — ABNORMAL LOW (ref 22–32)
Calcium: 9.2 mg/dL (ref 8.9–10.3)
Chloride: 110 mmol/L (ref 98–111)
Creatinine, Ser: 1 mg/dL (ref 0.61–1.24)
GFR, Estimated: >60 mL/min (ref >60)
Glucose, Bld: 146 mg/dL — ABNORMAL HIGH (ref 70–99)
Potassium: 4.3 mmol/L (ref 3.5–5.1)
Sodium: 137 mmol/L (ref 135–145)
Total Bilirubin: 1.2 mg/dL (ref 0.3–1.2)
Total Protein: 7.6 g/dL (ref 6.5–8.1)

## 2022-08-27 LAB — URINALYSIS, ROUTINE W REFLEX MICROSCOPIC
Bilirubin Urine: NEGATIVE
Glucose, UA: NEGATIVE mg/dL
Hgb urine dipstick: NEGATIVE
Ketones, ur: 15 mg/dL — AB
Leukocytes,Ua: NEGATIVE
Nitrite: NEGATIVE
Protein, ur: NEGATIVE mg/dL
Specific Gravity, Urine: 1.025 (ref 1.005–1.030)
pH: 5 (ref 5.0–8.0)

## 2022-08-27 LAB — RESP PANEL BY RT-PCR (RSV, FLU A&B, COVID)  RVPGX2
Influenza A by PCR: NEGATIVE
Influenza B by PCR: NEGATIVE
Resp Syncytial Virus by PCR: NEGATIVE
SARS Coronavirus 2 by RT PCR: NEGATIVE

## 2022-08-27 LAB — CBC
HCT: 48.5 % (ref 39.0–52.0)
Hemoglobin: 16.6 g/dL (ref 13.0–17.0)
MCH: 29.4 pg (ref 26.0–34.0)
MCHC: 34.2 g/dL (ref 30.0–36.0)
MCV: 85.8 fL (ref 80.0–100.0)
Platelets: 177 10*3/uL (ref 150–400)
RBC: 5.65 MIL/uL (ref 4.22–5.81)
RDW: 13.4 % (ref 11.5–15.5)
WBC: 13.5 10*3/uL — ABNORMAL HIGH (ref 4.0–10.5)
nRBC: 0 % (ref 0.0–0.2)

## 2022-08-27 LAB — LIPASE, BLOOD: Lipase: 38 U/L (ref 11–51)

## 2022-08-27 MED ORDER — ONDANSETRON HCL 4 MG PO TABS: 4.0000 mg | ORAL_TABLET | Freq: Four times a day (QID) | ORAL | 0 refills | Status: DC | PRN

## 2022-08-27 MED ORDER — SODIUM CHLORIDE 0.9 % IV BOLUS
1500.0000 mL | Freq: Once | INTRAVENOUS | Status: AC
  Administered 2022-08-27 (×2): 1500 mL via INTRAVENOUS

## 2022-08-27 MED ORDER — ONDANSETRON HCL 4 MG/2ML IJ SOLN
4.0000 mg | Freq: Once | INTRAMUSCULAR | Status: AC
  Administered 2022-08-27: 4 mg via INTRAVENOUS
  Filled 2022-08-27: qty 2

## 2022-08-27 NOTE — ED Notes (Signed)
Original pump was throwing error code with air in line. No air in line, line was flushed and ensured to be clean repeatedly. Pump swapped out with 2 new channels and issue was resolved. MAR was modified to show original order as ongoing, not finished.

## 2022-08-27 NOTE — ED Triage Notes (Signed)
Reported he feels like he can't keep anything down, vomiting, dry mouth and weak;

## 2022-08-27 NOTE — ED Notes (Signed)
States feels," better" ED PA informed, given gingerale for fluid challenge

## 2022-08-27 NOTE — Discharge Instructions (Addendum)
Please replenish fluids including electrolyte containing fluids such as Gatorade, Pedialyte, if you have ongoing nausea, vomiting.  Please take some time to rest until your symptoms resolve,, and follow-up with your PCP if you have any concerns.  Please return to the emergency department if you have return of intractable nausea, vomiting, concern for ongoing dehydration, develop severe abdominal pain.

## 2022-08-27 NOTE — ED Provider Notes (Signed)
[facility] Provider Note   CSN: [id number] Arrival date & time: 08/27/22  1132     History  Chief Complaint  Patient presents with   Emesis    Jose [name] is a 81 y.o. male with past medical history significant for Barrett's esophagus, abdominal aortic aneurysm, recently evaluated and measured who presents with concern for dehydration, nausea, vomiting.  Patient reports that he was recently around his daughter who had virus, had similar symptoms and required some fluid rehydration.  He is worried that he may have the same thing.  Denies any chest pain, shortness of breath, cough, fever.  Patient reports that the symptoms started this morning with multiple episodes of emesis since then.  Reports 1 fully formed bowel movement, denies any hematemesis, hematochezia, significant diarrhea, constipation.  He denies dysuria.  He denies significant abdominal pain at rest.   Emesis      Home Medications Prior to Admission medications   Medication Sig Start Date End Date Taking? Authorizing Provider  ondansetron (ZOFRAN) 4 MG tablet Take 1 tablet (4 mg total) by mouth every 6 (six) hours as needed for nausea or vomiting. 08/27/22  Yes Domnique Vanegas H, PA-C  aspirin EC 81 MG tablet Take 1 tablet (81 mg total) by mouth 2 (two) times daily. For DVT prophylaxis Patient taking differently: Take 81 mg by mouth daily. For DVT prophylaxis 03/21/18   Prudencio Burly III, PA-C  loperamide (IMODIUM) 2 MG capsule Take 1 capsule (2 mg total) by mouth 2 (two) times daily as needed for diarrhea or loose stools. 04/19/21   Jaynee Eagles, PA-C  omeprazole (PRILOSEC) 40 MG capsule Take 1 capsule (40 mg total) by mouth daily. 06/29/22   Ladene Artist, MD  rosuvastatin (CRESTOR) 20 MG tablet Take 1 tablet (20 mg total) by mouth daily. 07/08/22   Marin Olp, MD  tadalafil (CIALIS) 20 MG tablet TAKE ONE-HALF (1/2) TABLET DAILY AS NEEDED FOR ERECTILE  DYSFUNCTION 07/08/22   Marin Olp, MD  traMADol (ULTRAM) 50 MG tablet Take 1-2 tablets (50-100 mg total) by mouth 2 (two) times daily as needed for severe pain (do not drive 8 hours after taking). 06/22/22   Marin Olp, MD      Allergies    Doxycycline and Lipitor [atorvastatin]    Review of Systems   Review of Systems  Gastrointestinal:  Positive for vomiting.  All other systems reviewed and are negative.   Physical Exam Updated Vital Signs BP 131/83   Pulse 68   Temp 97.6 F (36.4 C) (Oral)   Resp 17   Ht '5\' 10"'$  (1.778 m)   Wt 86.2 kg   SpO2 97%   BMI 27.26 kg/m  Physical Exam Vitals and nursing note reviewed.  Constitutional:      General: He is not in acute distress.    Appearance: Normal appearance.  HENT:     Head: Normocephalic and atraumatic.     Mouth/Throat:     Mouth: Mucous membranes are dry.  Eyes:     General:        Right eye: No discharge.        Left eye: No discharge.  Cardiovascular:     Rate and Rhythm: Normal rate and regular rhythm.     Heart sounds: No murmur heard.    No friction rub. No gallop.  Pulmonary:     Effort: Pulmonary effort is normal.     Breath sounds: Normal breath sounds.  Abdominal:     General: Bowel sounds are normal.     Palpations: Abdomen is soft.     Comments: No tenderness to palpation of the abdomen  Skin:    General: Skin is warm and dry.     Capillary Refill: Capillary refill takes less than 2 seconds.  Neurological:     Mental Status: He is alert and oriented to person, place, and time.  Psychiatric:        Mood and Affect: Mood normal.        Behavior: Behavior normal.     ED Results / Procedures / Treatments   Labs (all labs ordered are listed, but only abnormal results are displayed) Labs Reviewed  COMPREHENSIVE METABOLIC PANEL - Abnormal; Notable for the following components:      Result Value   CO2 19 (*)    Glucose, Bld 146 (*)    All other components within normal limits  CBC -  Abnormal; Notable for the following components:   WBC 13.5 (*)    All other components within normal limits  URINALYSIS, ROUTINE W REFLEX MICROSCOPIC - Abnormal; Notable for the following components:   Ketones, ur 15 (*)    All other components within normal limits  RESP PANEL BY RT-PCR (RSV, FLU A&B, COVID)  [id number]  LIPASE, BLOOD    EKG None  Radiology No results found.  Procedures Procedures    Medications Ordered in ED Medications  sodium chloride 0.9 % bolus 1,500 mL (0 mLs Intravenous Stopped 08/27/22 1441)  ondansetron (ZOFRAN) injection 4 mg (4 mg Intravenous Given 08/27/22 1255)    ED Course/ Medical Decision Making/ A&P                           Medical Decision Making Amount and/or Complexity of Data Reviewed Labs: ordered.  Risk Prescription drug management.   This patient is a 81 y.o. male  who presents to the ED for concern of Nausea, vomiting, concern for dehydration, without abdominal pain.   Differential diagnoses prior to evaluation: The emergent differential diagnosis includes, but is not limited to, acute gastroenteritis, vs. AAA, mesenteric ischemia, appendicitis, diverticulitis, DKA, gastritis, gastroenteritis, AMI, nephrolithiasis, pancreatitis, peritonitis, adrenal insufficiency,lead poisoning, iron toxicity, intestinal ischemia, constipation, UTI,SBO/LBO, splenic rupture, biliary disease, IBD, IBS, PUD, or hepatitis.  The low clinical suspicion for many of the diagnoses above with no significant abdominal pain in this patient I think an upper respiratory or GI illness, versus symptoms related to his history of esophagitis, food poisoning are most likely.   This is not an exhaustive differential.   Past Medical History / Co-morbidities: Right esophagus, aortic aneurysm, hyperlipidemia, obesity, anxiety, coronary artery disease  Physical Exam: Physical exam performed. The pertinent findings include: Patient arrives with mild tachycardia, and has  dry mucous membranes suggestive of some mild dehydration, this is supported by return to normal heart rate after fluid rehydration.  Patient without any active nausea and vomiting during his stay, he tolerated p.o. challenge prior to discharge.  He has no abdominal pain, fever, chills throughout the duration of his exam.  Lab Tests/Imaging studies: I personally interpreted labs/imaging and the pertinent results include: Tested for RBB, flu, COVID, RSV negative, UA with mild ketones suggestive of early dehydration, CMP overall unremarkable, mild hyperglycemia, glucose 146, no significant electrolyte abnormalities.  CBC notable for very mild leukocytosis, nonspecific, likely secondary to dehydration.  Lipase normal.  Medications: I ordered medication including 1500 mL fluid bolus,  Zofran.  I have reviewed the patients home medicines and have made adjustments as needed.  Patient tolerating p.o. at time of discharge, will discharge home on Zofran for any ongoing nausea, vomiting   Disposition: After consideration of the diagnostic results and the patients response to treatment, I feel that symptoms are consistent with mild gastroenteritis, dehydration, improved and tolerating PO at time of DC.   emergency department workup does not suggest an emergent condition requiring admission or immediate intervention beyond what has been performed at this time. The plan is: as above. The patient is safe for discharge and has been instructed to return immediately for worsening symptoms, change in symptoms or any other concerns.  Final Clinical Impression(s) / ED Diagnoses Final diagnoses:  Dehydration  Nausea and vomiting, unspecified vomiting type    Rx / DC Orders ED Discharge Orders          Ordered    ondansetron (ZOFRAN) 4 MG tablet  Every 6 hours PRN        08/27/22 1526              Raeshawn Vo, [place] H, PA-C 08/27/22 [place], [place], DO 08/28/22 [id number]

## 2022-09-14 DIAGNOSIS — Z955 Presence of coronary angioplasty implant and graft: Secondary | ICD-10-CM | POA: Diagnosis not present

## 2022-09-14 DIAGNOSIS — I2510 Atherosclerotic heart disease of native coronary artery without angina pectoris: Secondary | ICD-10-CM | POA: Diagnosis not present

## 2022-09-14 DIAGNOSIS — R011 Cardiac murmur, unspecified: Secondary | ICD-10-CM | POA: Insufficient documentation

## 2022-09-14 DIAGNOSIS — E785 Hyperlipidemia, unspecified: Secondary | ICD-10-CM | POA: Diagnosis not present

## 2022-09-14 DIAGNOSIS — I10 Essential (primary) hypertension: Secondary | ICD-10-CM | POA: Diagnosis not present

## 2022-12-03 DIAGNOSIS — I340 Nonrheumatic mitral (valve) insufficiency: Secondary | ICD-10-CM | POA: Diagnosis not present

## 2022-12-03 DIAGNOSIS — I517 Cardiomegaly: Secondary | ICD-10-CM | POA: Diagnosis not present

## 2022-12-08 ENCOUNTER — Encounter: Payer: Self-pay | Admitting: Gastroenterology

## 2022-12-23 ENCOUNTER — Ambulatory Visit: Payer: Medicare Other | Admitting: Family Medicine

## 2022-12-23 ENCOUNTER — Encounter: Payer: Self-pay | Admitting: Family Medicine

## 2022-12-23 VITALS — BP 130/70 | HR 51 | Temp 97.8°F | Ht 70.0 in | Wt 194.8 lb

## 2022-12-23 DIAGNOSIS — R739 Hyperglycemia, unspecified: Secondary | ICD-10-CM | POA: Diagnosis not present

## 2022-12-23 DIAGNOSIS — I714 Abdominal aortic aneurysm, without rupture, unspecified: Secondary | ICD-10-CM | POA: Diagnosis not present

## 2022-12-23 DIAGNOSIS — Z131 Encounter for screening for diabetes mellitus: Secondary | ICD-10-CM | POA: Diagnosis not present

## 2022-12-23 DIAGNOSIS — I2510 Atherosclerotic heart disease of native coronary artery without angina pectoris: Secondary | ICD-10-CM

## 2022-12-23 DIAGNOSIS — E785 Hyperlipidemia, unspecified: Secondary | ICD-10-CM | POA: Diagnosis not present

## 2022-12-23 LAB — CBC WITH DIFFERENTIAL/PLATELET
Basophils Absolute: 0 10*3/uL (ref 0.0–0.1)
Basophils Relative: 0.6 % (ref 0.0–3.0)
Eosinophils Absolute: 0.1 10*3/uL (ref 0.0–0.7)
Eosinophils Relative: 1.5 % (ref 0.0–5.0)
HCT: 42.1 % (ref 39.0–52.0)
Hemoglobin: 14.7 g/dL (ref 13.0–17.0)
Lymphocytes Relative: 22.3 % (ref 12.0–46.0)
Lymphs Abs: 1.1 10*3/uL (ref 0.7–4.0)
MCHC: 34.9 g/dL (ref 30.0–36.0)
MCV: 84.4 fl (ref 78.0–100.0)
Monocytes Absolute: 0.8 10*3/uL (ref 0.1–1.0)
Monocytes Relative: 15.6 % — ABNORMAL HIGH (ref 3.0–12.0)
Neutro Abs: 2.9 10*3/uL (ref 1.4–7.7)
Neutrophils Relative %: 60 % (ref 43.0–77.0)
Platelets: 168 10*3/uL (ref 150.0–400.0)
RBC: 4.99 Mil/uL (ref 4.22–5.81)
RDW: 14.1 % (ref 11.5–15.5)
WBC: 4.8 10*3/uL (ref 4.0–10.5)

## 2022-12-23 LAB — COMPREHENSIVE METABOLIC PANEL
ALT: 12 U/L (ref 0–53)
AST: 15 U/L (ref 0–37)
Albumin: 3.9 g/dL (ref 3.5–5.2)
Alkaline Phosphatase: 75 U/L (ref 39–117)
BUN: 14 mg/dL (ref 6–23)
CO2: 27 mEq/L (ref 19–32)
Calcium: 8.9 mg/dL (ref 8.4–10.5)
Chloride: 106 mEq/L (ref 96–112)
Creatinine, Ser: 0.96 mg/dL (ref 0.40–1.50)
GFR: 75.27 mL/min (ref >60.00)
Glucose, Bld: 86 mg/dL (ref 70–99)
Potassium: 4.3 mEq/L (ref 3.5–5.1)
Sodium: 138 mEq/L (ref 135–145)
Total Bilirubin: 0.7 mg/dL (ref 0.2–1.2)
Total Protein: 6.4 g/dL (ref 6.0–8.3)

## 2022-12-23 LAB — LIPID PANEL
Cholesterol: 107 mg/dL (ref 0–200)
HDL: 40.9 mg/dL (ref >39.00)
LDL Cholesterol: 55 mg/dL (ref 0–99)
NonHDL: 66.23
Total CHOL/HDL Ratio: 3
Triglycerides: 56 mg/dL (ref 0.0–149.0)
VLDL: 11.2 mg/dL (ref 0.0–40.0)

## 2022-12-23 LAB — HEMOGLOBIN A1C: Hgb A1c MFr Bld: 5.7 % (ref 4.6–6.5)

## 2022-12-23 MED ORDER — ROSUVASTATIN CALCIUM 20 MG PO TABS: 20.0000 mg | ORAL_TABLET | Freq: Every day | ORAL | 3 refills | Status: DC

## 2022-12-23 MED ORDER — OMEPRAZOLE 40 MG PO CPDR: 40.0000 mg | DELAYED_RELEASE_CAPSULE | Freq: Every day | ORAL | 3 refills | Status: DC

## 2022-12-23 MED ORDER — ONDANSETRON HCL 4 MG PO TABS: 4.0000 mg | ORAL_TABLET | Freq: Four times a day (QID) | ORAL | 0 refills | Status: DC | PRN

## 2022-12-23 MED ORDER — TADALAFIL 20 MG PO TABS: ORAL_TABLET | ORAL | 3 refills | Status: AC

## 2022-12-23 MED ORDER — TRAMADOL HCL 50 MG PO TABS: 50.0000 mg | ORAL_TABLET | Freq: Two times a day (BID) | ORAL | 5 refills | Status: DC | PRN

## 2022-12-23 NOTE — Patient Instructions (Addendum)
Please stop by lab before you go If you have mychart- we will send your results within 3 business days of [place] receiving them.  If you do not have mychart- we will call you about results within 5 business days of [place] receiving them.  *please also note that you will see labs on mychart as soon as they post. I will later go in and write notes on them- will say "notes from Dr. Durene Cal"   No changes today unless labs lead [place] to make changes   Recommended follow up: Return in about 28 weeks (around 07/07/2023) for followup or sooner if needed.Schedule b4 you leave.

## 2022-12-23 NOTE — Progress Notes (Signed)
Phone [phone] In person visit   Subjective:   Jose [name] is a 82 y.o. year old very pleasant male patient who presents for/with See problem oriented charting Chief Complaint  Patient presents with   Medical Management of Chronic Issues    Pt is not interested in Pneumonia, Shingles or tdap vaccines (no need to re address). Will only get flu shot.   Hyperlipidemia    )   Past Medical History-  Patient Active Problem List   Diagnosis Date Noted   History of COVID-19.  Hospitalization November 2020 with acute respiratory failure. 07/09/2019    Priority: High   Aortic aneurysm 06/04/2019    Priority: High   CAD (coronary artery disease) 02/09/2007    Priority: High   Barrett's esophagus 02/09/2007    Priority: High   Vitamin B12 deficiency 12/17/2020    Priority: Medium    Fatty liver 06/04/2019    Priority: Medium    GERD (gastroesophageal reflux disease) 10/23/2018    Priority: Medium    Primary osteoarthritis of left knee 02/27/2018    Priority: Medium    Elevated PSA 08/03/2016    Priority: Medium    Benign prostatic hypertrophy (BPH) with nocturia 05/27/2016    Priority: Medium    Hyperglycemia 02/26/2013    Priority: Medium    Hyperlipidemia 02/09/2007    Priority: Medium    Aortic atherosclerosis 06/04/2019    Priority: Low   Acute prostatitis 02/21/2019    Priority: Low   Nasal congestion 10/23/2018    Priority: Low   Lumbosacral radiculopathy 06/17/2015    Priority: Low   Numbness 06/17/2015    Priority: Low   Right leg numbness     Priority: Low   Sinus bradycardia 03/19/2015    Priority: Low   Diverticulosis of colon with hemorrhage     Priority: Low   Actinic keratosis 08/07/2014    Priority: Low   Elevated blood pressure 03/29/2014    Priority: Low   OBESITY 03/20/2010    Priority: Low   Generalized anxiety disorder 08/22/2008    Priority: Low   ERECTILE DYSFUNCTION 08/22/2008    Priority: Low   Diverticulosis of large intestine  02/09/2007    Priority: Low   Gastrointestinal hemorrhage 10/15/2014    Medications- reviewed and updated Current Outpatient Medications  Medication Sig Dispense Refill   aspirin EC 81 MG tablet Take 1 tablet (81 mg total) by mouth 2 (two) times daily. For DVT prophylaxis (Patient taking differently: Take 81 mg by mouth daily. For DVT prophylaxis) 60 tablet 0   loperamide (IMODIUM) 2 MG capsule Take 1 capsule (2 mg total) by mouth 2 (two) times daily as needed for diarrhea or loose stools. 14 capsule 0   omeprazole (PRILOSEC) 40 MG capsule Take 1 capsule (40 mg total) by mouth daily. 90 capsule 3   ondansetron (ZOFRAN) 4 MG tablet Take 1 tablet (4 mg total) by mouth every 6 (six) hours as needed for nausea or vomiting. 14 tablet 0   rosuvastatin (CRESTOR) 20 MG tablet Take 1 tablet (20 mg total) by mouth daily. 90 tablet 3   tadalafil (CIALIS) 20 MG tablet TAKE ONE-HALF (1/2) TABLET DAILY AS NEEDED FOR ERECTILE DYSFUNCTION 90 tablet 3   traMADol (ULTRAM) 50 MG tablet Take 1-2 tablets (50-100 mg total) by mouth 2 (two) times daily as needed for severe pain (do not drive 8 hours after taking). 60 tablet 5   No current facility-administered medications for this visit.  Objective:  BP 130/70   Pulse (!) 51   Temp 97.8 F (36.6 C)   Ht  (1.778 m)   Wt 194 lb 12.8 oz (88.4 kg)   SpO2 97%   BMI 27.95 kg/m  Gen: NAD, resting comfortably CV: slightly bradycardic- I do no thear murmur cardiology heard (aortic sclerosis on echocardiogram 12/03/22 with [facility]) Lungs: CTAB no crackles, wheeze, rhonchi Abdomen: soft/nontender/nondistended/normal bowel sounds. No rebound or guarding.  Ext: no edema Skin: warm, dry Neuro: grossly normal, moves all extremities    Assessment and Plan   #social update- lost wife september 2023 to aortic aneurysm- ongoing grieving process  #Hyperglycemia-peak A1c 5.8 s: Diet controlled in the past -up a few lbs with grieving and not cooking for 2  has been hard -down 1 pounds from last visit interestingly enough- plans to try to improve exercise Lab Results  Component Value Date   HGBA1C 5.7 06/22/2022   HGBA1C 5.7 12/21/2021   HGBA1C 5.6 06/11/2021  A/P:  hopefully stable- update a1c today. Continue without meds for now    #CAD-followed in [facility] with history of stent- remains on aspirin 81 mg #Hyperlipidemia  Ideal LDL under 70 s: Well controlled on rosuvastatin 20 mg.  -no chest pain (unless really pushes himself but stable) or shortness of breath - also had echocardiogram with cardiology - aortic sclerosis Lab Results  Component Value Date   CHOL 112 12/21/2021   HDL 41.80 12/21/2021   LDLCALC 53 12/21/2021   LDLDIRECT 100.0 12/19/2017   TRIG 87.0 12/21/2021   CHOLHDL 3 12/21/2021   A/P: CAD largely asymptomatic  except mild chest pain with more intesne exertion- possible stable angina- continue current medications Lipids at goal previously. Hopefully stable- update lipid panel today. Continue current meds for now    S: History of left knee replacement-with continued pain in the left knee despite this patient using 1 tramadol in the morning and 1 in the evening for his knees.  We received a letter from [facility] in 2020 requesting that we begin his long-term tramadol prescription and we have taken over - tramadol usually 2 in am and 2 in pm  mostly A/P:overall stable- continue current medications    #GERD/Barrett's esophagus S: doing well on omeprazole  per Dr. Russella Dar.  - Barrett's esophagus without dysplasia per Dr. Russella Dar 03/19/21 with 3 year repeat  A/P: stable- continue current medicines   # Fatty liver S:  Noted on CT 04/20/2019.  Lab Results  Component Value Date   ALT 21 08/27/2022   AST 22 08/27/2022   ALKPHOS 82 08/27/2022   BILITOT 1.2 08/27/2022  A/P: LFTs ok- continue to monitor    # Aortic aneurysm S: 3 cm aneurysm noted on ct 04/20/2019- 3 year repeat US planned.  -updated 07/07/22 at 3.7  cm up slightly A/P: discussed mild worsening- opted to recheck at next visit   #Elevated PSA-following up with urology yearly at least. Has noted some urgency and plans to schedule urology follow up  with them  Recommended follow up: Return in about 28 weeks (around 07/07/2023) for followup or sooner if needed.Schedule b4 you leave. Future Appointments  Date Time Provider Department Center  02/18/2023  8:00 AM [facility] ANNUAL WELLNESS VISIT 1 [facility]    Lab/Order associations:   ICD-10-CM   1. Coronary artery disease involving native coronary artery of native heart without angina pectoris  I25.10 CBC with Differential/Platelet    Comprehensive metabolic panel    Lipid panel  2. Hyperlipidemia, unspecified hyperlipidemia type  E78.5 CBC with Differential/Platelet    Comprehensive metabolic panel    Lipid panel    3. Hyperglycemia  R73.9 Hemoglobin A1c    4. Screening for diabetes mellitus  Z13.1 Hemoglobin A1c    5. Abdominal aortic aneurysm (AAA) without rupture, unspecified part Chronic I71.40       Meds ordered this encounter  Medications   tadalafil (CIALIS) 20 MG tablet    Sig: TAKE ONE-HALF (1/2) TABLET DAILY AS NEEDED FOR ERECTILE DYSFUNCTION    Dispense:  90 tablet    Refill:  3   rosuvastatin (CRESTOR) 20 MG tablet    Sig: Take 1 tablet (20 mg total) by mouth daily.    Dispense:  90 tablet    Refill:  3   ondansetron (ZOFRAN) 4 MG tablet    Sig: Take 1 tablet (4 mg total) by mouth every 6 (six) hours as needed for nausea or vomiting.    Dispense:  14 tablet    Refill:  0   omeprazole (PRILOSEC) 40 MG capsule    Sig: Take 1 capsule (40 mg total) by mouth daily.    Dispense:  90 capsule    Refill:  3   traMADol (ULTRAM) 50 MG tablet    Sig: Take 1-2 tablets (50-100 mg total) by mouth 2 (two) times daily as needed for severe pain (do not drive 8 hours after taking).    Dispense:  60 tablet    Refill:  5    This request is for a new prescription for a  controlled substance as required by Federal/State law..    Return precautions advised.  Tana Conch, MD

## 2023-02-14 ENCOUNTER — Ambulatory Visit (INDEPENDENT_AMBULATORY_CARE_PROVIDER_SITE_OTHER): Payer: Medicare Other

## 2023-02-14 VITALS — Wt 190.0 lb

## 2023-02-14 DIAGNOSIS — Z0000 Encounter for general adult medical examination without abnormal findings: Secondary | ICD-10-CM

## 2023-02-14 NOTE — Patient Instructions (Signed)
[name] , Thank you for taking time to come for your Medicare Wellness Visit. I appreciate your ongoing commitment to your health goals. Please review the following plan we discussed and let me know if I can assist you in the future.   These are the goals we discussed:  Goals      Patient Stated     Continue to lose weight      Patient Stated     None at this time      Patient Stated     Keep active         This is a list of the screening recommended for you and due dates:  Health Maintenance  Topic Date Due   Zoster (Shingles) Vaccine (1 of 2) 03/24/2023*   Pneumonia Vaccine (2 of 2 - PCV) 12/23/2023*   Flu Shot  03/31/2023   Medicare Annual Wellness Visit  02/14/2024   Hepatitis C Screening  Completed   HPV Vaccine  Aged Out   Screening for Lung Cancer  Discontinued   DTaP/Tdap/Td vaccine  Discontinued   Colon Cancer Screening  Discontinued   COVID-19 Vaccine  Discontinued  *Topic was postponed. The date shown is not the original due date.    Advanced directives: Advance directive discussed with you today. Even though you declined this today please call our office should you change your mind and we can give you the proper paperwork for you to fill out.  Conditions/risks identified: stay active   Next appointment: Follow up in one year for your annual wellness visit.   Preventive Care 110 Years and Older, Male  Preventive care refers to lifestyle choices and visits with your health care provider that can promote health and wellness. What does preventive care include? A yearly physical exam. This is also called an annual well check. Dental exams once or twice a year. Routine eye exams. Ask your health care provider how often you should have your eyes checked. Personal lifestyle choices, including: Daily care of your teeth and gums. Regular physical activity. Eating a healthy diet. Avoiding tobacco and drug use. Limiting alcohol use. Practicing safe sex. Taking  low doses of aspirin every day. Taking vitamin and mineral supplements as recommended by your health care provider. What happens during an annual well check? The services and screenings done by your health care provider during your annual well check will depend on your age, overall health, lifestyle risk factors, and family history of disease. Counseling  Your health care provider may ask you questions about your: Alcohol use. Tobacco use. Drug use. Emotional well-being. Home and relationship well-being. Sexual activity. Eating habits. History of falls. Memory and ability to understand (cognition). Work and work Astronomer. Screening  You may have the following tests or measurements: Height, weight, and BMI. Blood pressure. Lipid and cholesterol levels. These may be checked every 5 years, or more frequently if you are over 18 years old. Skin check. Lung cancer screening. You may have this screening every year starting at age 41 if you have a 30-pack-year history of smoking and currently smoke or have quit within the past 15 years. Fecal occult blood test (FOBT) of the stool. You may have this test every year starting at age 40. Flexible sigmoidoscopy or colonoscopy. You may have a sigmoidoscopy every 5 years or a colonoscopy every 10 years starting at age 22. Prostate cancer screening. Recommendations will vary depending on your family history and other risks. Hepatitis C blood test. Hepatitis B blood test.  Sexually transmitted disease (STD) testing. Diabetes screening. This is done by checking your blood sugar (glucose) after you have not eaten for a while (fasting). You may have this done every 1-3 years. Abdominal aortic aneurysm (AAA) screening. You may need this if you are a current or former smoker. Osteoporosis. You may be screened starting at age 28 if you are at high risk. Talk with your health care provider about your test results, treatment options, and if necessary, the  need for more tests. Vaccines  Your health care provider may recommend certain vaccines, such as: Influenza vaccine. This is recommended every year. Tetanus, diphtheria, and acellular pertussis (Tdap, Td) vaccine. You may need a Td booster every 10 years. Zoster vaccine. You may need this after age 61. Pneumococcal 13-valent conjugate (PCV13) vaccine. One dose is recommended after age 61. Pneumococcal polysaccharide (PPSV23) vaccine. One dose is recommended after age 68. Talk to your health care provider about which screenings and vaccines you need and how often you need them. This information is not intended to replace advice given to you by your health care provider. Make sure you discuss any questions you have with your health care provider. Document Released: 09/12/2015 Document Revised: 05/05/2016 Document Reviewed: 06/17/2015 Elsevier Interactive Patient Education  2017 [employer] Prevention in the Home Falls can cause injuries. They can happen to people of all ages. There are many things you can do to make your home safe and to help prevent falls. What can I do on the outside of my home? Regularly fix the edges of walkways and driveways and fix any cracks. Remove anything that might make you trip as you walk through a door, such as a raised step or threshold. Trim any bushes or trees on the path to your home. Use bright outdoor lighting. Clear any walking paths of anything that might make someone trip, such as rocks or tools. Regularly check to see if handrails are loose or broken. Make sure that both sides of any steps have handrails. Any raised decks and porches should have guardrails on the edges. Have any leaves, snow, or ice cleared regularly. Use sand or salt on walking paths during winter. Clean up any spills in your garage right away. This includes oil or grease spills. What can I do in the bathroom? Use night lights. Install grab bars by the toilet and in the tub  and shower. Do not use towel bars as grab bars. Use non-skid mats or decals in the tub or shower. If you need to sit down in the shower, use a plastic, non-slip stool. Keep the floor dry. Clean up any water that spills on the floor as soon as it happens. Remove soap buildup in the tub or shower regularly. Attach bath mats securely with double-sided non-slip rug tape. Do not have throw rugs and other things on the floor that can make you trip. What can I do in the bedroom? Use night lights. Make sure that you have a light by your bed that is easy to reach. Do not use any sheets or blankets that are too big for your bed. They should not hang down onto the floor. Have a firm chair that has side arms. You can use this for support while you get dressed. Do not have throw rugs and other things on the floor that can make you trip. What can I do in the kitchen? Clean up any spills right away. Avoid walking on wet floors. Keep items that you  use a lot in easy-to-reach places. If you need to reach something above you, use a strong step stool that has a grab bar. Keep electrical cords out of the way. Do not use floor polish or wax that makes floors slippery. If you must use wax, use non-skid floor wax. Do not have throw rugs and other things on the floor that can make you trip. What can I do with my stairs? Do not leave any items on the stairs. Make sure that there are handrails on both sides of the stairs and use them. Fix handrails that are broken or loose. Make sure that handrails are as long as the stairways. Check any carpeting to make sure that it is firmly attached to the stairs. Fix any carpet that is loose or worn. Avoid having throw rugs at the top or bottom of the stairs. If you do have throw rugs, attach them to the floor with carpet tape. Make sure that you have a light switch at the top of the stairs and the bottom of the stairs. If you do not have them, ask someone to add them for  you. What else can I do to help prevent falls? Wear shoes that: Do not have high heels. Have rubber bottoms. Are comfortable and fit you well. Are closed at the toe. Do not wear sandals. If you use a stepladder: Make sure that it is fully opened. Do not climb a closed stepladder. Make sure that both sides of the stepladder are locked into place. Ask someone to hold it for you, if possible. Clearly mark and make sure that you can see: Any grab bars or handrails. First and last steps. Where the edge of each step is. Use tools that help you move around (mobility aids) if they are needed. These include: Canes. Walkers. Scooters. Crutches. Turn on the lights when you go into a dark area. Replace any light bulbs as soon as they burn out. Set up your furniture so you have a clear path. Avoid moving your furniture around. If any of your floors are uneven, fix them. If there are any pets around you, be aware of where they are. Review your medicines with your doctor. Some medicines can make you feel dizzy. This can increase your chance of falling. Ask your doctor what other things that you can do to help prevent falls. This information is not intended to replace advice given to you by your health care provider. Make sure you discuss any questions you have with your health care provider. Document Released: 06/12/2009 Document Revised: 01/22/2016 Document Reviewed: 09/20/2014 Elsevier Interactive Patient Education  2017 [employer].

## 2023-02-14 NOTE — Progress Notes (Signed)
I connected with  Jose [name] on 02/14/23 by a audio enabled telemedicine application and verified that I am speaking with the correct person using two identifiers.  Patient Location: Home  Provider Location: Office/Clinic  I discussed the limitations of evaluation and management by telemedicine. The patient expressed understanding and agreed to proceed.   Subjective:   Jose [name] is a 82 y.o. male who presents for Medicare Annual/Subsequent preventive examination.  Review of Systems     Cardiac Risk Factors include: advanced age (>62men, >45 women);male gender;dyslipidemia     Objective:    Today's Vitals   02/14/23 1539  Weight: 190 lb (86.2 kg)   Body mass index is 27.26 kg/m.     02/14/2023    3:44 PM 08/27/2022   11:46 AM 02/12/2022    8:05 AM 07/16/2021    3:26 PM 02/06/2021    8:10 AM 12/03/2019    1:45 PM 07/10/2019    2:37 PM  Advanced Directives  Does Patient Have a Medical Advance Directive? No No No No No Yes No  Type of Careers adviser;Living will   Does patient want to make changes to medical advance directive?      No - Patient declined   Copy of Healthcare Power of Attorney in Chart?      No - copy requested   Would patient like information on creating a medical advance directive? No - Patient declined No - Patient declined No - Patient declined No - Patient declined No - Patient declined  No - Patient declined    Current Medications (verified) Outpatient Encounter Medications as of 02/14/2023  Medication Sig   aspirin EC 81 MG tablet Take 1 tablet (81 mg total) by mouth 2 (two) times daily. For DVT prophylaxis (Patient taking differently: Take 81 mg by mouth daily. For DVT prophylaxis)   loperamide (IMODIUM) 2 MG capsule Take 1 capsule (2 mg total) by mouth 2 (two) times daily as needed for diarrhea or loose stools.   omeprazole (PRILOSEC) 40 MG capsule Take 1 capsule (40 mg total) by mouth daily.    ondansetron (ZOFRAN) 4 MG tablet Take 1 tablet (4 mg total) by mouth every 6 (six) hours as needed for nausea or vomiting.   rosuvastatin (CRESTOR) 20 MG tablet Take 1 tablet (20 mg total) by mouth daily.   tadalafil (CIALIS) 20 MG tablet TAKE ONE-HALF (1/2) TABLET DAILY AS NEEDED FOR ERECTILE DYSFUNCTION   traMADol (ULTRAM) 50 MG tablet Take 1-2 tablets (50-100 mg total) by mouth 2 (two) times daily as needed for severe pain (do not drive 8 hours after taking).   No facility-administered encounter medications on file as of 02/14/2023.    Allergies (verified) Doxycycline and Lipitor [atorvastatin]   History: Past Medical History:  Diagnosis Date   Acute lower GI bleeding 01/25/2013; 09/2014   diverticular bleed   Acute prostatitis 02/21/2019   ANXIETY 08/22/2008   Arthritis    "a little bit" (04/14/2015)   Barrett's esophagus 2003   hematochezia as a result since 2003   Basal cell cancer    removed from right forearm.    Bradycardia 04/14/2015   CORONARY ARTERY DISEASE    2 stents   Depression    DIVERTICULOSIS, COLON 2003   ERECTILE DYSFUNCTION 08/22/2008   GERD (gastroesophageal reflux disease)    Heart murmur 04/14/2015   "they think I did; that's why I'm here" (04/14/2015)   History of kidney stones  HYPERLIPIDEMIA 02/09/2007   Kidney stones    "blasted or passed" (04/14/2015)   MI (myocardial infarction) (HCC) 2000   Numbness    right side - history of    OBESITY 03/20/2010   Squamous cell cancer of skin of earlobe    removed from area behind right earlobe   Stroke (HCC)    4 yrs ago possible mini stroke -  tests never found anything   Past Surgical History:  Procedure Laterality Date   BASAL CELL CARCINOMA EXCISION     posterior right ear.    CATARACT EXTRACTION, BILATERAL     COLONOSCOPY N/A 01/26/2013   Procedure: COLONOSCOPY;  Surgeon: Rachael Fee, MD;  Location: [facility] ENDOSCOPY;  Service: Endoscopy;  Laterality: N/A;   CORONARY ANGIOPLASTY WITH STENT PLACEMENT   09/06/1998   LAD stent per Dr Chales Abrahams in [place]. 2 stents.    CYSTOSCOPY W/ URETERAL STENT PLACEMENT Left 12/21/2016   Procedure: CYSTOSCOPY WITH RETROGRADE PYELOGRAM/URETERAL STENT PLACEMENT;  Surgeon: Sebastian Ache, MD;  Location: [facility] ORS;  Service: Urology;  Laterality: Left;   ESOPHAGOGASTRODUODENOSCOPY (EGD) WITH ESOPHAGEAL DILATION     "q time I have an EGD; usually q 3-4 years" (04/14/2015)   EXTRACORPOREAL SHOCK WAVE LITHOTRIPSY Left 12/30/2016   Procedure: LEFT EXTRACORPOREAL SHOCK WAVE LITHOTRIPSY (ESWL);  Surgeon: Jethro Bolus, MD;  Location: [facility];  Service: Urology;  Laterality: Left;   LITHOTRIPSY  X 2   SQUAMOUS CELL CARCINOMA EXCISION     right forearm   TONSILLECTOMY  1971   TOTAL KNEE ARTHROPLASTY Left 03/21/2018   TOTAL KNEE ARTHROPLASTY Left 03/21/2018   Procedure: LEFT TOTAL KNEE ARTHROPLASTY;  Surgeon: Sheral Apley, MD;  Location: [facility] OR;  Service: Orthopedics;  Laterality: Left;   UPPER GASTROINTESTINAL ENDOSCOPY     URETERAL STENT PLACEMENT     2019   Family History  Problem Relation Age of Onset   Alcohol abuse Father    Liver disease Father    Heart disease Brother        CABG   Colon cancer Neg Hx    Social History   Socioeconomic History   Marital status: Widowed    Spouse name: Not on file   Number of children: Not on file   Years of education: Not on file   Highest education level: Not on file  Occupational History   Not on file  Tobacco Use   Smoking status: Former    Packs/day: 1.00    Years: 30.00    Additional pack years: 0.00    Total pack years: 30.00    Types: Cigarettes    Quit date: 08/30/1988    Years since quitting: 34.4   Smokeless tobacco: Never   Tobacco comments:    "quit smoking cigarettes in ~ 1990"  Vaping Use   Vaping Use: Never used  Substance and Sexual Activity   Alcohol use: Yes    Comment: soccially   Drug use: No   Sexual activity: Not on file  Other Topics Concern   Not on file  Social History  Narrative   Still working at [employer]   Married 35 years in 2015, 4 kids from first marriage, 3 grandkids   Hobbies: woodworking, tv movies- old action movies like Rambo   Social Determinants of Health   Financial Resource Strain: Low Risk  (02/14/2023)   Overall Financial Resource Strain (CARDIA)    Difficulty of Paying Living Expenses: Not hard at all  Food Insecurity: No Food  Insecurity (02/14/2023)   Hunger Vital Sign    Worried About Running Out of Food in the Last Year: Never true    Ran Out of Food in the Last Year: Never true  Transportation Needs: No Transportation Needs (02/14/2023)   PRAPARE - Administrator, Civil Service (Medical): No    Lack of Transportation (Non-Medical): No  Physical Activity: Inactive (02/14/2023)   Exercise Vital Sign    Days of Exercise per Week: 0 days    Minutes of Exercise per Session: 0 min  Stress: No Stress Concern Present (02/14/2023)   [employer] of Occupational Health - Occupational Stress Questionnaire    Feeling of Stress : Only a little  Social Connections: Socially Isolated (02/14/2023)   Social Connection and Isolation Panel [NHANES]    Frequency of Communication with Friends and Family: More than three times a week    Frequency of Social Gatherings with Friends and Family: More than three times a week    Attends Religious Services: Never    Database administrator or Organizations: No    Attends Banker Meetings: Never    Marital Status: Widowed    Tobacco Counseling Counseling given: Not Answered Tobacco comments: "quit smoking cigarettes in ~ 1990"   Clinical Intake:  Pre-visit preparation completed: Yes        BMI - recorded: 27.26 Nutritional Status: BMI 25 -29 Overweight Nutritional Risks: None Diabetes: No  How often do you need to have someone help you when you read instructions, pamphlets, or other written materials from your doctor or pharmacy?: 1 -  Never  Diabetic?no  Interpreter Needed?: No  Information entered by :: Lanier Ensign, LPN   Activities of Daily Living    02/14/2023    3:46 PM  In your present state of health, do you have any difficulty performing the following activities:  Hearing? 1  Comment hearing aids  Vision? 0  Difficulty concentrating or making decisions? 0  Walking or climbing stairs? 0  Dressing or bathing? 0  Doing errands, shopping? 0  Preparing Food and eating ? N  Using the Toilet? N  In the past six months, have you accidently leaked urine? N  Do you have problems with loss of bowel control? N  Managing your Medications? N  Managing your Finances? N  Housekeeping or managing your Housekeeping? N    Patient Care Team: Shelva Majestic, MD as PCP - General (Family Medicine) [place], [facility] Meryl Dare, MD as Consulting Physician (Gastroenterology) Elissa Hefty, NP as Nurse Practitioner (Cardiology)  Indicate any recent Medical Services you may have received from other than [facility] providers in the past year (date may be approximate).     Assessment:   This is a routine wellness examination for Juliani.  Hearing/Vision screen Hearing Screening - Comments:: Pt wears hearing aids  Vision Screening - Comments:: [facility] for annual [facility] exams   Dietary issues and exercise activities discussed: Current Exercise Habits: The patient does not participate in regular exercise at present   Goals Addressed             This Visit's Progress    Patient Stated       Keep active        Depression Screen    02/14/2023    3:43 PM 02/12/2022    8:04 AM 06/11/2021    2:56 PM 02/06/2021    8:09 AM 12/10/2020    3:01  PM 12/03/2019    1:46 PM 08/02/2019    4:01 PM  PHQ 2/9 Scores  PHQ - 2 Score 0 0 0 0 0 0 0  PHQ- 9 Score     0      Fall Risk    02/14/2023    3:45 PM 02/12/2022    8:07 AM 06/11/2021    2:55 PM 02/06/2021    8:12 AM  12/10/2020    3:01 PM  Fall Risk   Falls in the past year? 0 0 0 0 0  Number falls in past yr: 0 0 0 0 0  Injury with Fall? 0 0 0 0 0  Risk for fall due to : Impaired vision Impaired vision No Fall Risks Impaired vision   Follow up Falls prevention discussed Falls prevention discussed Falls evaluation completed Falls prevention discussed     FALL RISK PREVENTION PERTAINING TO THE HOME:  Any stairs in or around the home? Yes  If so, are there any without handrails? No  Home free of loose throw rugs in walkways, pet beds, electrical cords, etc? Yes  Adequate lighting in your home to reduce risk of falls? Yes   ASSISTIVE DEVICES UTILIZED TO PREVENT FALLS:  Life alert? No  Use of a cane, walker or w/c? No  Grab bars in the bathroom? No  Shower chair or bench in shower? Yes  Elevated toilet seat or a handicapped toilet? No   TIMED UP AND GO:  Was the test performed? No .   Cognitive Function:        02/14/2023    3:47 PM 02/12/2022    8:08 AM 02/06/2021    8:15 AM 12/03/2019    1:46 PM  6CIT Screen  What Year? 0 points 0 points 0 points 0 points  What month? 0 points 0 points 0 points 0 points  What time? 0 points 0 points 0 points 0 points  Count back from 20 0 points 0 points 0 points 0 points  Months in reverse 0 points 0 points 0 points 0 points  Repeat phrase 0 points 0 points 0 points 0 points  Total Score 0 points 0 points 0 points 0 points    Immunizations Immunization History  Administered Date(s) Administered   Fluad Quad(high Dose 65+) 06/04/2019, 06/22/2022   Influenza Split 08/23/2011   Influenza, High Dose Seasonal PF 08/03/2016, 09/05/2017, 06/20/2018, 08/29/2020   Influenza,inj,Quad PF,6+ Mos 08/07/2014   PFIZER(Purple Top)SARS-COV-2 Vaccination 10/21/2019, 11/13/2019   Pneumococcal Polysaccharide-23 08/22/2008   Td 08/31/2003   Td (Adult), 2 Lf Tetanus Toxid, Preservative Free 08/31/2003      Flu Vaccine status: Up to date  Pneumococcal vaccine  status: Due, Education has been provided regarding the importance of this vaccine. Advised may receive this vaccine at local pharmacy or Health Dept. Aware to provide a copy of the vaccination record if obtained from local pharmacy or Health Dept. Verbalized acceptance and understanding.  Covid-19 vaccine status: Completed vaccines  Qualifies for Shingles Vaccine? Yes   Zostavax completed No   Shingrix Completed?: No.    Education has been provided regarding the importance of this vaccine. Patient has been advised to call insurance company to determine out of pocket expense if they have not yet received this vaccine. Advised may also receive vaccine at local pharmacy or Health Dept. Verbalized acceptance and understanding.  Screening Tests Health Maintenance  Topic Date Due   Zoster Vaccines- Shingrix (1 of 2) 03/24/2023 (Originally 08/21/1960)   Pneumonia Vaccine  12+ Years old (2 of 2 - PCV) 12/23/2023 (Originally 08/22/2009)   INFLUENZA VACCINE  03/31/2023   Medicare Annual Wellness (AWV)  02/14/2024   Hepatitis C Screening  Completed   HPV VACCINES  Aged Out   Lung Cancer Screening  Discontinued   DTaP/Tdap/Td  Discontinued   Colonoscopy  Discontinued   COVID-19 Vaccine  Discontinued    Health Maintenance  There are no preventive care reminders to display for this patient.   Colorectal cancer screening: No longer required.    Additional Screening:  Hepatitis C Screening:  Completed 06/11/20  Vision Screening: Recommended annual ophthalmology exams for early detection of glaucoma and other disorders of the eye. Is the patient up to date with their annual eye exam?  No  Who is the provider or what is the name of the office in which the patient attends annual eye exams? [facility]  If pt is not established with a provider, would they like to be referred to a provider to establish care? No .   Dental Screening: Recommended annual dental exams for proper oral  hygiene  Community Resource Referral / Chronic Care Management: CRR required this visit?  No   CCM required this visit?  No      Plan:     I have personally reviewed and noted the following in the patient's chart:   Medical and social history Use of alcohol, tobacco or illicit drugs  Current medications and supplements including opioid prescriptions. Patient is currently taking opioid prescriptions. Information provided to patient regarding non-opioid alternatives. Patient advised to discuss non-opioid treatment plan with their provider. Functional ability and status Nutritional status Physical activity Advanced directives List of other physicians Hospitalizations, surgeries, and ER visits in previous 12 months Vitals Screenings to include cognitive, depression, and falls Referrals and appointments  In addition, I have reviewed and discussed with patient certain preventive protocols, quality metrics, and best practice recommendations. A written personalized care plan for preventive services as well as general preventive health recommendations were provided to patient.     Marzella Schlein, LPN   1/61/0960   Nurse Notes: none

## 2023-02-15 ENCOUNTER — Ambulatory Visit
Admission: EM | Admit: 2023-02-15 | Discharge: 2023-02-15 | Disposition: A | Discharge status: Home / Self Care | Payer: Medicare Other | Attending: Physician Assistant | Admitting: Physician Assistant

## 2023-02-15 DIAGNOSIS — H6121 Impacted cerumen, right ear: Secondary | ICD-10-CM | POA: Diagnosis not present

## 2023-02-15 LAB — POCT RAPID STREP A (OFFICE): Rapid Strep A Screen: NEGATIVE

## 2023-02-15 NOTE — ED Triage Notes (Signed)
Here for L-ear fullness and pain x 3 days. Pt wears hearing aids.

## 2023-02-15 NOTE — ED Notes (Signed)
Results entered on wrong pt in error

## 2023-02-20 NOTE — ED Provider Notes (Signed)
[name]    CSN: [id number] Arrival date & time: 02/15/23  1852      History   Chief Complaint Chief Complaint  Patient presents with   Ear Fullness    HPI Jose [name] is a 82 y.o. male.   Patient complains of difficulty hearing due to earwax buildup  The history is provided by the patient. No language interpreter was used.  Ear Fullness This is a new problem. The problem occurs constantly. Pertinent negatives include no chest pain and no abdominal pain. He has tried nothing for the symptoms.    Past Medical History:  Diagnosis Date   Acute lower GI bleeding 01/25/2013; 09/2014   diverticular bleed   Acute prostatitis 02/21/2019   ANXIETY 08/22/2008   Arthritis    "a little bit" (04/14/2015)   Barrett's esophagus 2003   hematochezia as a result since 2003   Basal cell cancer    removed from right forearm.    Bradycardia 04/14/2015   CORONARY ARTERY DISEASE    2 stents   Depression    DIVERTICULOSIS, COLON 2003   ERECTILE DYSFUNCTION 08/22/2008   GERD (gastroesophageal reflux disease)    Heart murmur 04/14/2015   "they think I did; that's why I'm here" (04/14/2015)   History of kidney stones    HYPERLIPIDEMIA 02/09/2007   Kidney stones    "blasted or passed" (04/14/2015)   MI (myocardial infarction) (HCC) 2000   Numbness    right side - history of    OBESITY 03/20/2010   Squamous cell cancer of skin of earlobe    removed from area behind right earlobe   Stroke (HCC)    4 yrs ago possible mini stroke -  tests never found anything    Patient Active Problem List   Diagnosis Date Noted   Vitamin B12 deficiency 12/17/2020   History of COVID-19.  Hospitalization November 2020 with acute respiratory failure. 07/09/2019   Aortic aneurysm (HCC) 06/04/2019   Fatty liver 06/04/2019   Aortic atherosclerosis (HCC) 06/04/2019   Acute prostatitis 02/21/2019   GERD (gastroesophageal reflux disease) 10/23/2018   Nasal congestion 10/23/2018   Primary  osteoarthritis of left knee 02/27/2018   Elevated PSA 08/03/2016   Benign prostatic hypertrophy (BPH) with nocturia 05/27/2016   Lumbosacral radiculopathy 06/17/2015   Numbness 06/17/2015   Right leg numbness    Sinus bradycardia 03/19/2015   Diverticulosis of colon with hemorrhage    Gastrointestinal hemorrhage 10/15/2014   Actinic keratosis 08/07/2014   Elevated blood pressure 03/29/2014   Hyperglycemia 02/26/2013   OBESITY 03/20/2010   Generalized anxiety disorder 08/22/2008   ERECTILE DYSFUNCTION 08/22/2008   Hyperlipidemia 02/09/2007   CAD (coronary artery disease) 02/09/2007   Barrett's esophagus 02/09/2007   Diverticulosis of large intestine 02/09/2007    Past Surgical History:  Procedure Laterality Date   BASAL CELL CARCINOMA EXCISION     posterior right ear.    CATARACT EXTRACTION, BILATERAL     COLONOSCOPY N/A 01/26/2013   Procedure: COLONOSCOPY;  Surgeon: Rachael Fee, MD;  Location: [facility] ENDOSCOPY;  Service: Endoscopy;  Laterality: N/A;   CORONARY ANGIOPLASTY WITH STENT PLACEMENT  09/06/1998   LAD stent per Dr Chales Abrahams in [place]. 2 stents.    CYSTOSCOPY W/ URETERAL STENT PLACEMENT Left 12/21/2016   Procedure: CYSTOSCOPY WITH RETROGRADE PYELOGRAM/URETERAL STENT PLACEMENT;  Surgeon: Sebastian Ache, MD;  Location: [facility] ORS;  Service: Urology;  Laterality: Left;   ESOPHAGOGASTRODUODENOSCOPY (EGD) WITH ESOPHAGEAL DILATION     "q time I have  an EGD; usually q 3-4 years" (04/14/2015)   EXTRACORPOREAL SHOCK WAVE LITHOTRIPSY Left 12/30/2016   Procedure: LEFT EXTRACORPOREAL SHOCK WAVE LITHOTRIPSY (ESWL);  Surgeon: Jethro Bolus, MD;  Location: [facility] ORS;  Service: Urology;  Laterality: Left;   LITHOTRIPSY  X 2   SQUAMOUS CELL CARCINOMA EXCISION     right forearm   TONSILLECTOMY  1971   TOTAL KNEE ARTHROPLASTY Left 03/21/2018   TOTAL KNEE ARTHROPLASTY Left 03/21/2018   Procedure: LEFT TOTAL KNEE ARTHROPLASTY;  Surgeon: Sheral Apley, MD;  Location: [facility] OR;  Service:  Orthopedics;  Laterality: Left;   UPPER GASTROINTESTINAL ENDOSCOPY     URETERAL STENT PLACEMENT     2019       Home Medications    Prior to Admission medications   Medication Sig Start Date End Date Taking? Authorizing Provider  aspirin EC 81 MG tablet Take 1 tablet (81 mg total) by mouth 2 (two) times daily. For DVT prophylaxis Patient taking differently: Take 81 mg by mouth daily. For DVT prophylaxis 03/21/18  Yes Albina Billet III, PA-C  omeprazole (PRILOSEC) 40 MG capsule Take 1 capsule (40 mg total) by mouth daily. 12/23/22  Yes Shelva Majestic, MD  rosuvastatin (CRESTOR) 20 MG tablet Take 1 tablet (20 mg total) by mouth daily. 12/23/22  Yes Shelva Majestic, MD  traMADol (ULTRAM) 50 MG tablet Take 1-2 tablets (50-100 mg total) by mouth 2 (two) times daily as needed for severe pain (do not drive 8 hours after taking). 12/23/22  Yes Shelva Majestic, MD  loperamide (IMODIUM) 2 MG capsule Take 1 capsule (2 mg total) by mouth 2 (two) times daily as needed for diarrhea or loose stools. 04/19/21   Wallis Bamberg, PA-C  ondansetron (ZOFRAN) 4 MG tablet Take 1 tablet (4 mg total) by mouth every 6 (six) hours as needed for nausea or vomiting. 12/23/22   Shelva Majestic, MD  tadalafil (CIALIS) 20 MG tablet TAKE ONE-HALF (1/2) TABLET DAILY AS NEEDED FOR ERECTILE DYSFUNCTION 12/23/22   Shelva Majestic, MD    Family History Family History  Problem Relation Age of Onset   Alcohol abuse Father    Liver disease Father    Heart disease Brother        CABG   Colon cancer Neg Hx     Social History Social History   Tobacco Use   Smoking status: Former    Packs/day: 1.00    Years: 30.00    Additional pack years: 0.00    Total pack years: 30.00    Types: Cigarettes    Quit date: 08/30/1988    Years since quitting: 34.4   Smokeless tobacco: Never   Tobacco comments:    "quit smoking cigarettes in ~ 1990"  Vaping Use   Vaping Use: Never used  Substance Use Topics   Alcohol use:  Yes    Comment: soccially   Drug use: No     Allergies   Doxycycline and Lipitor [atorvastatin]   Review of Systems Review of Systems  Cardiovascular:  Negative for chest pain.  Gastrointestinal:  Negative for abdominal pain.  All other systems reviewed and are negative.    Physical Exam Triage Vital Signs ED Triage Vitals  Enc Vitals Group     BP 02/15/23 1929 130/83     Pulse Rate 02/15/23 1929 62     Resp 02/15/23 1929 18     Temp 02/15/23 1929 98.1 F (36.7 C)     Temp Source 02/15/23 1929  Oral     SpO2 02/15/23 1929 96 %     Weight --      Height --      Head Circumference --      Peak Flow --      Pain Score 02/15/23 2020 0     Pain Loc --      Pain Edu? --      Excl. in GC? --    No data found.  Updated Vital Signs BP 130/83 (BP Location: Left Arm)   Pulse 62   Temp 98.1 F (36.7 C) (Oral)   Resp 18   SpO2 96%   Visual Acuity Right Eye Distance:   Left Eye Distance:   Bilateral Distance:    Right Eye Near:   Left Eye Near:    Bilateral Near:     Physical Exam Vitals and nursing note reviewed.  Constitutional:      Appearance: He is well-developed.  HENT:     Head: Normocephalic.     Right Ear: There is impacted cerumen.  Pulmonary:     Effort: Pulmonary effort is normal.  Abdominal:     General: There is no distension.  Musculoskeletal:     Cervical back: Normal range of motion.  Neurological:     Mental Status: He is alert and oriented to person, place, and time.      UC Treatments / Results  Labs (all labs ordered are listed, but only abnormal results are displayed) Labs Reviewed  POCT RAPID STREP A (OFFICE)    EKG   Radiology No results found.  Procedures Procedures (including critical care time)  Medications Ordered in UC Medications - No data to display  Initial Impression / Assessment and Plan / UC Course  I have reviewed the triage vital signs and the nursing notes.  Pertinent labs & imaging results that  were available during my care of the patient were reviewed by me and considered in my medical decision making (see chart for details).     Ear wax removal by rn  Final diagnoses:  Hearing loss of right ear due to cerumen impaction   Discharge Instructions   None    ED Prescriptions   None    PDMP not reviewed this encounter. An After Visit Summary was printed and given to the patient.       Elson Areas, [place] 02/20/23 1500

## 2023-02-25 ENCOUNTER — Other Ambulatory Visit (HOSPITAL_COMMUNITY): Payer: Self-pay

## 2023-03-25 ENCOUNTER — Telehealth: Payer: Self-pay | Admitting: Gastroenterology

## 2023-03-25 ENCOUNTER — Other Ambulatory Visit (INDEPENDENT_AMBULATORY_CARE_PROVIDER_SITE_OTHER): Payer: Medicare Other

## 2023-03-25 DIAGNOSIS — K625 Hemorrhage of anus and rectum: Secondary | ICD-10-CM | POA: Diagnosis not present

## 2023-03-25 LAB — CBC WITH DIFFERENTIAL/PLATELET
Basophils Absolute: 0 10*3/uL (ref 0.0–0.1)
Basophils Relative: 0.7 % (ref 0.0–3.0)
Eosinophils Absolute: 0.1 10*3/uL (ref 0.0–0.7)
Eosinophils Relative: 1.6 % (ref 0.0–5.0)
HCT: 43.4 % (ref 39.0–52.0)
Hemoglobin: 14.2 g/dL (ref 13.0–17.0)
Lymphocytes Relative: 25.9 % (ref 12.0–46.0)
Lymphs Abs: 1.4 10*3/uL (ref 0.7–4.0)
MCHC: 32.8 g/dL (ref 30.0–36.0)
MCV: 86.5 fl (ref 78.0–100.0)
Monocytes Absolute: 0.8 10*3/uL (ref 0.1–1.0)
Monocytes Relative: 15 % — ABNORMAL HIGH (ref 3.0–12.0)
Neutro Abs: 3.1 10*3/uL (ref 1.4–7.7)
Neutrophils Relative %: 56.8 % (ref 43.0–77.0)
Platelets: 186 10*3/uL (ref 150.0–400.0)
RBC: 5.01 Mil/uL (ref 4.22–5.81)
RDW: 14.4 % (ref 11.5–15.5)
WBC: 5.4 10*3/uL (ref 4.0–10.5)

## 2023-03-25 LAB — COMPREHENSIVE METABOLIC PANEL
ALT: 12 U/L (ref 0–53)
AST: 16 U/L (ref 0–37)
Albumin: 4 g/dL (ref 3.5–5.2)
Alkaline Phosphatase: 72 U/L (ref 39–117)
BUN: 13 mg/dL (ref 6–23)
CO2: 28 mEq/L (ref 19–32)
Calcium: 9.6 mg/dL (ref 8.4–10.5)
Chloride: 106 mEq/L (ref 96–112)
Creatinine, Ser: 1.13 mg/dL (ref 0.40–1.50)
GFR: 61.78 mL/min (ref >60.00)
Glucose, Bld: 91 mg/dL (ref 70–99)
Potassium: 4 mEq/L (ref 3.5–5.1)
Sodium: 140 mEq/L (ref 135–145)
Total Bilirubin: 0.8 mg/dL (ref 0.2–1.2)
Total Protein: 6.7 g/dL (ref 6.0–8.3)

## 2023-03-25 NOTE — Telephone Encounter (Signed)
Patient presented to the office this afternoon c/o around 5 bloody loose bowel movements since around 10:30 pm last night. Patient denies any abdominal pain/tenderness. Denies any additional symptoms. States he has not had a bowel movement since around 6 this morning. He does have a history of diverticulosis on last colon 2014 and diverticular bleeds.  I spoke briefly with Alcide Evener, NP about this patient's case and she has advised that we order STAT CBC, CMP. Patient did go for this testing. He is advised that if he has any additional large bloody bowel movements, chest pain, shortness of breath, dizziness, he should report directly to the emergency room. Patient verbalizes understanding of this.  Will await lab results for further recommendations.

## 2023-03-25 NOTE — Telephone Encounter (Signed)
Patient is advised that labs came back stable.Advised him again, however, that should he experience any additional large volume blood stool, dizziness, chest pain, shortness of breath, that he immediately report to ER. Patient again verbalizes understanding.

## 2023-03-25 NOTE — Telephone Encounter (Signed)
Patient called states he had two bowel movements last night and early this morning that was with a lot of blood. He is seeking advise.

## 2023-03-28 ENCOUNTER — Emergency Department (HOSPITAL_BASED_OUTPATIENT_CLINIC_OR_DEPARTMENT_OTHER)
Admission: EM | Admit: 2023-03-28 | Discharge: 2023-03-28 | Disposition: A | Discharge status: Home / Self Care | Payer: Medicare Other | Attending: Emergency Medicine | Admitting: Emergency Medicine

## 2023-03-28 ENCOUNTER — Encounter (HOSPITAL_BASED_OUTPATIENT_CLINIC_OR_DEPARTMENT_OTHER): Payer: Self-pay

## 2023-03-28 ENCOUNTER — Emergency Department (HOSPITAL_BASED_OUTPATIENT_CLINIC_OR_DEPARTMENT_OTHER): Payer: Medicare Other

## 2023-03-28 ENCOUNTER — Other Ambulatory Visit: Payer: Self-pay

## 2023-03-28 DIAGNOSIS — K5790 Diverticulosis of intestine, part unspecified, without perforation or abscess without bleeding: Secondary | ICD-10-CM

## 2023-03-28 DIAGNOSIS — K8050 Calculus of bile duct without cholangitis or cholecystitis without obstruction: Secondary | ICD-10-CM | POA: Diagnosis not present

## 2023-03-28 DIAGNOSIS — Z96652 Presence of left artificial knee joint: Secondary | ICD-10-CM | POA: Insufficient documentation

## 2023-03-28 DIAGNOSIS — I2510 Atherosclerotic heart disease of native coronary artery without angina pectoris: Secondary | ICD-10-CM | POA: Diagnosis not present

## 2023-03-28 DIAGNOSIS — K922 Gastrointestinal hemorrhage, unspecified: Secondary | ICD-10-CM | POA: Insufficient documentation

## 2023-03-28 DIAGNOSIS — Z85828 Personal history of other malignant neoplasm of skin: Secondary | ICD-10-CM | POA: Insufficient documentation

## 2023-03-28 DIAGNOSIS — I7143 Infrarenal abdominal aortic aneurysm, without rupture: Secondary | ICD-10-CM | POA: Insufficient documentation

## 2023-03-28 DIAGNOSIS — K625 Hemorrhage of anus and rectum: Secondary | ICD-10-CM | POA: Diagnosis present

## 2023-03-28 DIAGNOSIS — Z7982 Long term (current) use of aspirin: Secondary | ICD-10-CM | POA: Insufficient documentation

## 2023-03-28 DIAGNOSIS — Z87891 Personal history of nicotine dependence: Secondary | ICD-10-CM | POA: Diagnosis not present

## 2023-03-28 DIAGNOSIS — K8080 Other cholelithiasis without obstruction: Secondary | ICD-10-CM

## 2023-03-28 DIAGNOSIS — K5730 Diverticulosis of large intestine without perforation or abscess without bleeding: Secondary | ICD-10-CM | POA: Diagnosis not present

## 2023-03-28 DIAGNOSIS — Z8616 Personal history of COVID-19: Secondary | ICD-10-CM | POA: Diagnosis not present

## 2023-03-28 DIAGNOSIS — K429 Umbilical hernia without obstruction or gangrene: Secondary | ICD-10-CM | POA: Diagnosis not present

## 2023-03-28 DIAGNOSIS — K8020 Calculus of gallbladder without cholecystitis without obstruction: Secondary | ICD-10-CM | POA: Diagnosis not present

## 2023-03-28 LAB — CBC WITH DIFFERENTIAL/PLATELET
Abs Immature Granulocytes: 0.01 10*3/uL (ref 0.00–0.07)
Basophils Absolute: 0.1 10*3/uL (ref 0.0–0.1)
Basophils Relative: 1 %
Eosinophils Absolute: 0.1 10*3/uL (ref 0.0–0.5)
Eosinophils Relative: 2 %
HCT: 37.2 % — ABNORMAL LOW (ref 39.0–52.0)
Hemoglobin: 13 g/dL (ref 13.0–17.0)
Immature Granulocytes: 0 %
Lymphocytes Relative: 28 %
Lymphs Abs: 1.2 10*3/uL (ref 0.7–4.0)
MCH: 29.3 pg (ref 26.0–34.0)
MCHC: 34.9 g/dL (ref 30.0–36.0)
MCV: 84 fL (ref 80.0–100.0)
Monocytes Absolute: 0.8 10*3/uL (ref 0.1–1.0)
Monocytes Relative: 17 %
Neutro Abs: 2.3 10*3/uL (ref 1.7–7.7)
Neutrophils Relative %: 52 %
Platelets: 156 10*3/uL (ref 150–400)
RBC: 4.43 MIL/uL (ref 4.22–5.81)
RDW: 13.4 % (ref 11.5–15.5)
WBC: 4.4 10*3/uL (ref 4.0–10.5)
nRBC: 0 % (ref 0.0–0.2)

## 2023-03-28 LAB — COMPREHENSIVE METABOLIC PANEL
ALT: 16 U/L (ref 0–44)
AST: 20 U/L (ref 15–41)
Albumin: 3.8 g/dL (ref 3.5–5.0)
Alkaline Phosphatase: 73 U/L (ref 38–126)
Anion gap: 10 (ref 5–15)
BUN: 12 mg/dL (ref 8–23)
CO2: 22 mmol/L (ref 22–32)
Calcium: 9.2 mg/dL (ref 8.9–10.3)
Chloride: 102 mmol/L (ref 98–111)
Creatinine, Ser: 1.08 mg/dL (ref 0.61–1.24)
GFR, Estimated: >60 mL/min (ref >60)
Glucose, Bld: 102 mg/dL — ABNORMAL HIGH (ref 70–99)
Potassium: 3.6 mmol/L (ref 3.5–5.1)
Sodium: 134 mmol/L — ABNORMAL LOW (ref 135–145)
Total Bilirubin: 0.8 mg/dL (ref 0.3–1.2)
Total Protein: 7 g/dL (ref 6.5–8.1)

## 2023-03-28 LAB — URINALYSIS, ROUTINE W REFLEX MICROSCOPIC
Bilirubin Urine: NEGATIVE
Glucose, UA: NEGATIVE mg/dL
Ketones, ur: NEGATIVE mg/dL
Leukocytes,Ua: NEGATIVE
Nitrite: NEGATIVE
Protein, ur: NEGATIVE mg/dL
Specific Gravity, Urine: 1.02 (ref 1.005–1.030)
pH: 7 (ref 5.0–8.0)

## 2023-03-28 LAB — PROTIME-INR
INR: 1 (ref 0.8–1.2)
Prothrombin Time: 13.8 seconds (ref 11.4–15.2)

## 2023-03-28 LAB — OCCULT BLOOD X 1 CARD TO LAB, STOOL: Fecal Occult Bld: POSITIVE — AB

## 2023-03-28 LAB — URINALYSIS, MICROSCOPIC (REFLEX)

## 2023-03-28 LAB — LIPASE, BLOOD: Lipase: 35 U/L (ref 11–51)

## 2023-03-28 MED ORDER — IOHEXOL 300 MG/ML  SOLN
100.0000 mL | Freq: Once | INTRAMUSCULAR | Status: AC | PRN
  Administered 2023-03-28: 100 mL via INTRAVENOUS

## 2023-03-28 MED ORDER — SODIUM CHLORIDE 0.9 % IV BOLUS
1000.0000 mL | Freq: Once | INTRAVENOUS | Status: AC
  Administered 2023-03-28: 1000 mL via INTRAVENOUS

## 2023-03-28 NOTE — ED Notes (Signed)
Patient resting quietly in stretcher, respirations even, unlabored, no acute distress noted. Denies needs at this time. Patient states he would like to go home today and would refuse admission if it was recommended.

## 2023-03-28 NOTE — ED Provider Notes (Signed)
[facility] Provider Note  CSN: [id number] Arrival date & time: 03/28/23 1529  Chief Complaint(s) Rectal Bleeding  HPI Jose [name] is a 82 y.o. male with past medical history as below, significant for diverticulitis, CAD, Barrett's esophagus, obesity who presents to the ED with complaint of blood in stool x 4 days  Patient reports he has had BRBPR over the past 4 days.  No nausea or vomiting.  History of Barrett's esophagus, diverticular disease, rectal bleeding in the past.  He is on anticoagulation.  He follows with Dr. Russella Dar, he had labs drawn on Friday which patient reports are normal.  They called him today and he was reporting ongoing bleeding and advised to go to the ER.  Reports he had profuse bleeding early Friday morning which has since tapered off and has become scant.  Noticed blood with wiping and some blood in his stool, this was bright red, now is more dark red.  He has mild abdominal pain.  No change in urination.  No rashes.  No chest pain, dyspnea, dizziness or lightheadedness.  No syncope or near syncope.  No bleeding between bowel movements.  Past Medical History Past Medical History:  Diagnosis Date   Acute lower GI bleeding 01/25/2013; 09/2014   diverticular bleed   Acute prostatitis 02/21/2019   ANXIETY 08/22/2008   Arthritis    "a little bit" (04/14/2015)   Barrett's esophagus 2003   hematochezia as a result since 2003   Basal cell cancer    removed from right forearm.    Bradycardia 04/14/2015   CORONARY ARTERY DISEASE    2 stents   Depression    DIVERTICULOSIS, COLON 2003   ERECTILE DYSFUNCTION 08/22/2008   GERD (gastroesophageal reflux disease)    Heart murmur 04/14/2015   "they think I did; that's why I'm here" (04/14/2015)   History of kidney stones    HYPERLIPIDEMIA 02/09/2007   Kidney stones    "blasted or passed" (04/14/2015)   MI (myocardial infarction) (HCC) 2000   Numbness    right side - history of     OBESITY 03/20/2010   Squamous cell cancer of skin of earlobe    removed from area behind right earlobe   Stroke (HCC)    4 yrs ago possible mini stroke -  tests never found anything   Patient Active Problem List   Diagnosis Date Noted   Vitamin B12 deficiency 12/17/2020   History of COVID-19.  Hospitalization November 2020 with acute respiratory failure. 07/09/2019   Aortic aneurysm (HCC) 06/04/2019   Fatty liver 06/04/2019   Aortic atherosclerosis (HCC) 06/04/2019   Acute prostatitis 02/21/2019   GERD (gastroesophageal reflux disease) 10/23/2018   Nasal congestion 10/23/2018   Primary osteoarthritis of left knee 02/27/2018   Elevated PSA 08/03/2016   Benign prostatic hypertrophy (BPH) with nocturia 05/27/2016   Lumbosacral radiculopathy 06/17/2015   Numbness 06/17/2015   Right leg numbness    Sinus bradycardia 03/19/2015   Diverticulosis of colon with hemorrhage    Gastrointestinal hemorrhage 10/15/2014   Actinic keratosis 08/07/2014   Elevated blood pressure 03/29/2014   Hyperglycemia 02/26/2013   OBESITY 03/20/2010   Generalized anxiety disorder 08/22/2008   ERECTILE DYSFUNCTION 08/22/2008   Hyperlipidemia 02/09/2007   CAD (coronary artery disease) 02/09/2007   Barrett's esophagus 02/09/2007   Diverticulosis of large intestine 02/09/2007   Home Medication(s) Prior to Admission medications   Medication Sig Start Date End Date Taking? Authorizing Provider  aspirin EC 81 MG  tablet Take 1 tablet (81 mg total) by mouth 2 (two) times daily. For DVT prophylaxis Patient taking differently: Take 81 mg by mouth daily. For DVT prophylaxis 03/21/18   Albina Billet III, PA-C  loperamide (IMODIUM) 2 MG capsule Take 1 capsule (2 mg total) by mouth 2 (two) times daily as needed for diarrhea or loose stools. 04/19/21   Wallis Bamberg, PA-C  omeprazole (PRILOSEC) 40 MG capsule Take 1 capsule (40 mg total) by mouth daily. 12/23/22   Shelva Majestic, MD  ondansetron (ZOFRAN) 4 MG  tablet Take 1 tablet (4 mg total) by mouth every 6 (six) hours as needed for nausea or vomiting. 12/23/22   Shelva Majestic, MD  rosuvastatin (CRESTOR) 20 MG tablet Take 1 tablet (20 mg total) by mouth daily. 12/23/22   Shelva Majestic, MD  tadalafil (CIALIS) 20 MG tablet TAKE ONE-HALF (1/2) TABLET DAILY AS NEEDED FOR ERECTILE DYSFUNCTION 12/23/22   Shelva Majestic, MD  traMADol (ULTRAM) 50 MG tablet Take 1-2 tablets (50-100 mg total) by mouth 2 (two) times daily as needed for severe pain (do not drive 8 hours after taking). 12/23/22   Shelva Majestic, MD                                                                                                                                    Past Surgical History Past Surgical History:  Procedure Laterality Date   BASAL CELL CARCINOMA EXCISION     posterior right ear.    CATARACT EXTRACTION, BILATERAL     COLONOSCOPY N/A 01/26/2013   Procedure: COLONOSCOPY;  Surgeon: Rachael Fee, MD;  [facility] ENDOSCOPY;  Service: Endoscopy;  Laterality: N/A;   CORONARY ANGIOPLASTY WITH STENT PLACEMENT  09/06/1998   LAD stent per Dr Chales Abrahams in [place]. 2 stents.    CYSTOSCOPY W/ URETERAL STENT PLACEMENT Left 12/21/2016   Procedure: CYSTOSCOPY WITH RETROGRADE PYELOGRAM/URETERAL STENT PLACEMENT;  Surgeon: Sebastian Ache, MD;  Location: [facility] ORS;  Service: Urology;  Laterality: Left;   ESOPHAGOGASTRODUODENOSCOPY (EGD) WITH ESOPHAGEAL DILATION     "q time I have an EGD; usually q 3-4 years" (04/14/2015)   EXTRACORPOREAL SHOCK WAVE LITHOTRIPSY Left 12/30/2016   Procedure: LEFT EXTRACORPOREAL SHOCK WAVE LITHOTRIPSY (ESWL);  Surgeon: Jethro Bolus, MD;  Location: [facility] ORS;  Service: Urology;  Laterality: Left;   LITHOTRIPSY  X 2   SQUAMOUS CELL CARCINOMA EXCISION     right forearm   TONSILLECTOMY  1971   TOTAL KNEE ARTHROPLASTY Left 03/21/2018   TOTAL KNEE ARTHROPLASTY Left 03/21/2018   Procedure: LEFT TOTAL KNEE ARTHROPLASTY;  Surgeon: Sheral Apley, MD;   Location: [facility] OR;  Service: Orthopedics;  Laterality: Left;   UPPER GASTROINTESTINAL ENDOSCOPY     URETERAL STENT PLACEMENT     2019   Family History Family History  Problem Relation Age of Onset   Alcohol abuse Father    Liver disease Father  Heart disease Brother        CABG   Colon cancer Neg Hx     Social History Social History   Tobacco Use   Smoking status: Former    Current packs/day: 0.00    Average packs/day: 1 pack/day for 30.0 years (30.0 ttl pk-yrs)    Types: Cigarettes    Start date: 08/30/1958    Quit date: 08/30/1988    Years since quitting: 34.5   Smokeless tobacco: Never   Tobacco comments:    "quit smoking cigarettes in ~ 1990"  Vaping Use   Vaping status: Never Used  Substance Use Topics   Alcohol use: Yes    Comment: soccially   Drug use: No   Allergies Doxycycline and Lipitor [atorvastatin]  Review of Systems Review of Systems  Constitutional:  Negative for chills, fatigue and fever.  Respiratory:  Negative for chest tightness and shortness of breath.   Cardiovascular:  Negative for chest pain and palpitations.  Gastrointestinal:  Positive for abdominal pain and blood in stool. Negative for nausea and vomiting.  Genitourinary:  Negative for dysuria and urgency.  Skin:  Negative for pallor.  Neurological:  Negative for syncope and light-headedness.  All other systems reviewed and are negative.   Physical Exam Vital Signs  I have reviewed the triage vital signs BP 137/86   Pulse (!) 52   Temp 98 F (36.7 C)   Resp 18   Ht 5' 9.5" (1.765 m)   Wt 86.2 kg   SpO2 98%   BMI 27.66 kg/m  Physical Exam Vitals and nursing note reviewed. Exam conducted with a chaperone present (RN Robin chaperone).  Constitutional:      General: He is not in acute distress.    Appearance: He is well-developed.  HENT:     Head: Normocephalic and atraumatic.     Right Ear: External ear normal.     Left Ear: External ear normal.     Mouth/Throat:     Mouth:  Mucous membranes are moist.  Eyes:     General: No scleral icterus. Cardiovascular:     Rate and Rhythm: Normal rate and regular rhythm.     Pulses: Normal pulses.     Heart sounds: Normal heart sounds.  Pulmonary:     Effort: Pulmonary effort is normal. No respiratory distress.     Breath sounds: Normal breath sounds.  Abdominal:     General: Abdomen is flat.     Palpations: Abdomen is soft.     Tenderness: There is no abdominal tenderness.  Genitourinary:    Comments: No frank rectal bleeding, no obvious external hemorrhoids.  Trace blood in rectal vault.   Musculoskeletal:     Cervical back: No rigidity.     Right lower leg: No edema.     Left lower leg: No edema.  Skin:    General: Skin is warm and dry.     Capillary Refill: Capillary refill takes less than 2 seconds.  Neurological:     Mental Status: He is alert and oriented to person, place, and time.     GCS: GCS eye subscore is 4. GCS verbal subscore is 5. GCS motor subscore is 6.  Psychiatric:        Mood and Affect: Mood normal.        Behavior: Behavior normal.     ED Results and Treatments Labs (all labs ordered are listed, but only abnormal results are displayed) Labs Reviewed  CBC WITH DIFFERENTIAL/PLATELET - Abnormal;  Notable for the following components:      Result Value   HCT 37.2 (*)    All other components within normal limits  COMPREHENSIVE METABOLIC PANEL - Abnormal; Notable for the following components:   Sodium 134 (*)    Glucose, Bld 102 (*)    All other components within normal limits  URINALYSIS, ROUTINE W REFLEX MICROSCOPIC - Abnormal; Notable for the following components:   Hgb urine dipstick TRACE (*)    All other components within normal limits  URINALYSIS, MICROSCOPIC (REFLEX) - Abnormal; Notable for the following components:   Bacteria, UA RARE (*)    All other components within normal limits  OCCULT BLOOD X 1 CARD TO LAB, STOOL - Abnormal; Notable for the following components:    Fecal Occult Bld POSITIVE (*)    All other components within normal limits  LIPASE, BLOOD  PROTIME-INR                                                                                                                          Radiology CT ABDOMEN PELVIS W CONTRAST  Result Date: 03/28/2023 CLINICAL DATA:  Abdominal pain, acute, nonlocalized brbpr, diverticular dz EXAM: CT ABDOMEN AND PELVIS WITH CONTRAST TECHNIQUE: Multidetector CT imaging of the abdomen and pelvis was performed using the standard protocol following bolus administration of intravenous contrast. RADIATION DOSE REDUCTION: This exam was performed according to the departmental dose-optimization program which includes automated exposure control, adjustment of the mA and/or kV according to patient size and/or use of iterative reconstruction technique. CONTRAST:  OMNIPAQUE IOHEXOL 300 MG/ML  SOLN COMPARISON:  CT renal 09/05/2018 FINDINGS: Lower chest: No aortic valve leaflet calcification. No acute abnormality. Possible tiny hiatal hernia. Hepatobiliary: No focal liver abnormality. Calcified gallstone noted within the gallbladder lumen. No gallbladder wall thickening or pericholecystic fluid. No biliary dilatation. Pancreas: No focal lesion. Normal pancreatic contour. No surrounding inflammatory changes. No main pancreatic ductal dilatation. Spleen: Normal in size without focal abnormality. Adrenals/Urinary Tract: No adrenal nodule bilaterally. Bilateral kidneys enhance symmetrically. Fluid density lesions likely represent simple renal cysts. Simple renal cysts, in the absence of clinically indicated signs/symptoms, require no independent follow-up. Subcentimeter hypodensities are too small to characterize-no further follow-up indicated. No hydronephrosis. No hydroureter. The urinary bladder is unremarkable. On delayed imaging, there is no urothelial wall thickening and there are no filling defects in the opacified portions of the bilateral  collecting systems or ureters. Stomach/Bowel: Stomach is within normal limits. No evidence of bowel wall thickening or dilatation. Colonic diverticulosis. Appendix appears normal. Vascular/Lymphatic: Enlarged aneurysmal infrarenal abdominal aorta aneurysm measuring up to 3.6 cm (from 3.1 cm in 2020). Severe atherosclerotic plaque of the aorta and its branches. No abdominal, pelvic, or inguinal lymphadenopathy. Reproductive: Prostatomegaly. Other: No intraperitoneal free fluid. No intraperitoneal free gas. No organized fluid collection. Musculoskeletal: Tiny fat containing umbilical hernia. Slight associated fat stranding. No suspicious lytic or blastic osseous lesions. No acute displaced fracture. Multilevel mild degenerative changes of the spine.  IMPRESSION: 1. Tiny fat containing umbilical hernia with slight associated contained fat stranding. Correlate clinically for incarceration. 2. Possible tiny hiatal hernia. 3. Cholelithiasis no CT finding of acute cholecystitis. 4. Colonic diverticulosis with no acute diverticulitis. 5. Prostatomegaly. 6. Aneurysmal infrarenal abdominal aorta aneurysm (3.6 cm). Recommend follow-up ultrasound every 2 years. This recommendation follows ACR consensus guidelines: White Paper of the ACR Incidental Findings Committee II on Vascular Findings. J Am Coll Radiol 2013; 10:789-794. Aortic aneurysm NOS (ICD10-I71.9). 7. Aortic Atherosclerosis (ICD10-I70.0) including aortic valve leaflet calcifications-no further follow-up indicated. Electronically Signed   By: Tish Frederickson M.D.   On: 03/28/2023 20:57    Pertinent labs & imaging results that were available during my care of the patient were reviewed by me and considered in my medical decision making (see MDM for details).  Medications Ordered in ED Medications  sodium chloride 0.9 % bolus 1,000 mL (0 mLs Intravenous Stopped 03/28/23 1923)  iohexol (OMNIPAQUE) 300 MG/ML solution 100 mL (100 mLs Intravenous Contrast Given 03/28/23  1856)                                                                                                                                     Procedures Procedures  (including critical care time)  Medical Decision Making / ED Course    Medical Decision Making:    BIJAN DAUZAT is a 82 y.o. male with past medical history as below, significant for diverticulitis, CAD, Barrett's esophagus, obesity who presents to the ED with complaint of blood in stool x 4 days. The complaint involves an extensive differential diagnosis and also carries with it a high risk of complications and morbidity.  Serious etiology was considered. Ddx includes but is not limited to: Differential diagnosis includes but is not exclusive to acute appendicitis, renal colic, testicular torsion, urinary tract infection, prostatitis,  diverticulitis, small bowel obstruction, colitis, abdominal aortic aneurysm, gastroenteritis, constipation etc.   Complete initial physical exam performed, notably the patient  was no acute distress, sitting in chair in exam room, abdomen is nonperitoneal.  HDS.  He is not pale.    Reviewed and confirmed nursing documentation for past medical history, family history, social history.  Vital signs reviewed.    Clinical Course as of 03/28/23 2131  Mon Mar 28, 2023  0981 Fecal Occult Blood, POC(!): POSITIVE [SG]  2103 CT w/ diverticulosis, cholelithiasis, stable imaging  [SG]  2125 Feeling better, no further BRBPR, no ongoing abd pain, HDS [SG]    Clinical Course User Index [SG] Sloan Leiter, DO   Patient with recurrent rectal bleeding, history of diverticular disease and Barrett's esophagus.  No thinners.  Bleeding has improved over the past 24 hours.  No vomiting. Screening labs obtained in triage, will also get CT imaging to evaluate acute intra-abdominal pathology Given IV fluids CT with diverticulosis, no diverticulitis Hgb stable Hemoccult was +tive Likely bleeding from  diverticulosis He is HDS Not  requiring transfusion No thinners No further bleeding episodes in the ED On recheck asymptomatic HDS He has f/u with Dr Sharmon Leyden Recommend discharged with close o/p GI f/u and strict return precautions    The patient improved significantly and was discharged in stable condition. Detailed discussions were had with the patient regarding current findings, and need for close f/u with PCP or on call doctor. The patient has been instructed to return immediately if the symptoms worsen in any way for re-evaluation. Patient verbalized understanding and is in agreement with current care plan. All questions answered prior to discharge.           Additional history obtained: -Additional history obtained from na -External records from outside source obtained and reviewed including: Chart review including previous notes, labs, imaging, consultation notes including prior labs and imaging, medications, primary care recommendation   Lab Tests: -I ordered, reviewed, and interpreted labs.   The pertinent results include:   Labs Reviewed  CBC WITH DIFFERENTIAL/PLATELET - Abnormal; Notable for the following components:      Result Value   HCT 37.2 (*)    All other components within normal limits  COMPREHENSIVE METABOLIC PANEL - Abnormal; Notable for the following components:   Sodium 134 (*)    Glucose, Bld 102 (*)    All other components within normal limits  URINALYSIS, ROUTINE W REFLEX MICROSCOPIC - Abnormal; Notable for the following components:   Hgb urine dipstick TRACE (*)    All other components within normal limits  URINALYSIS, MICROSCOPIC (REFLEX) - Abnormal; Notable for the following components:   Bacteria, UA RARE (*)    All other components within normal limits  OCCULT BLOOD X 1 CARD TO LAB, STOOL - Abnormal; Notable for the following components:   Fecal Occult Bld POSITIVE (*)    All other components within normal limits  LIPASE, BLOOD   PROTIME-INR    Notable for fobt +tive  EKG   EKG Interpretation Date/Time:    Ventricular Rate:    PR Interval:    QRS Duration:    QT Interval:    QTC Calculation:   R Axis:      Text Interpretation:           Imaging Studies ordered: I ordered imaging studies including CT abdomen pelvis I independently visualized the following imaging with scope of interpretation limited to determining acute life threatening conditions related to emergency care; findings noted above, significant for as above, diverticulosis I independently visualized and interpreted imaging. I agree with the radiologist interpretation   Medicines ordered and prescription drug management: Meds ordered this encounter  Medications   sodium chloride 0.9 % bolus 1,000 mL   iohexol (OMNIPAQUE) 300 MG/ML solution 100 mL    -I have reviewed the patients home medicines and have made adjustments as needed   Consultations Obtained: na   Cardiac Monitoring: The patient was maintained on a cardiac monitor.  I personally viewed and interpreted the cardiac monitored which showed an underlying rhythm of: NSR  Social Determinants of Health:  Diagnosis or treatment significantly limited by social determinants of health: former smoker   Reevaluation: After the interventions noted above, I reevaluated the patient and found that they have improved  Co morbidities that complicate the patient evaluation  Past Medical History:  Diagnosis Date   Acute lower GI bleeding 01/25/2013; 09/2014   diverticular bleed   Acute prostatitis 02/21/2019   ANXIETY 08/22/2008   Arthritis    "a little bit" (04/14/2015)   Barrett's  esophagus 2003   hematochezia as a result since 2003   Basal cell cancer    removed from right forearm.    Bradycardia 04/14/2015   CORONARY ARTERY DISEASE    2 stents   Depression    DIVERTICULOSIS, COLON 2003   ERECTILE DYSFUNCTION 08/22/2008   GERD (gastroesophageal reflux disease)    Heart  murmur 04/14/2015   "they think I did; that's why I'm here" (04/14/2015)   History of kidney stones    HYPERLIPIDEMIA 02/09/2007   Kidney stones    "blasted or passed" (04/14/2015)   MI (myocardial infarction) (HCC) 2000   Numbness    right side - history of    OBESITY 03/20/2010   Squamous cell cancer of skin of earlobe    removed from area behind right earlobe   Stroke (HCC)    4 yrs ago possible mini stroke -  tests never found anything      Dispostion: Disposition decision including need for hospitalization was considered, and patient discharged from emergency department.    Final Clinical Impression(s) / ED Diagnoses Final diagnoses:  Lower GI bleed  Diverticulosis  Biliary calculus of other site without obstruction  Infrarenal abdominal aortic aneurysm (AAA) without rupture (HCC)        Sloan Leiter, DO 03/28/23 2131

## 2023-03-28 NOTE — ED Notes (Signed)
Patient transported to CT 

## 2023-03-28 NOTE — Discharge Instructions (Addendum)
Your workup today is re-assuring If bleeding returns/worsens please come back for recheck Follow up with Dr Melene Muller /gastroenterology  It was a pleasure caring for you today in the emergency department.  Please return to the emergency department for any worsening or worrisome symptoms.

## 2023-03-28 NOTE — ED Triage Notes (Signed)
Pt presents with bloody BMs x 4 days. Pt states his PCP drew blood on Friday and was noted to be normal. Pt denies fatigue or ShOB. Pt denies anticoagulants. Pt has hx of diverticular disease.

## 2023-03-28 NOTE — ED Notes (Signed)
Reviewed AVS with patient, patient expressed understanding of directions, denies further questions at this time. 

## 2023-03-30 ENCOUNTER — Telehealth: Payer: Self-pay

## 2023-03-30 NOTE — Telephone Encounter (Signed)
Fax received stating Tadalafil is not covered for diagnosis of Erectile Dysfunction

## 2023-03-30 NOTE — Telephone Encounter (Signed)
Meryl Dare, MD  Loretha Stapler, RN Please schedule an urgent office appt with me or one the APPs (with 7 day urgent appt).         Appt made to see Jose [name] on 04/06/23 at 10 am.   Left message on machine to call back

## 2023-03-30 NOTE — Telephone Encounter (Signed)
This pt returned call and has been advised of the appt with [facility] on 8/7.

## 2023-03-30 NOTE — Telephone Encounter (Signed)
Can PA be submitted for this?

## 2023-03-31 ENCOUNTER — Other Ambulatory Visit (HOSPITAL_COMMUNITY): Payer: Self-pay

## 2023-03-31 ENCOUNTER — Telehealth: Payer: Self-pay

## 2023-03-31 NOTE — Telephone Encounter (Signed)
Pharmacy Patient Advocate Encounter   Received notification from RX Request Messages that prior authorization for Tadalafil 20mg  is required/requested.   Insurance verification completed.   The patient is insured through [employer] .   Per test claim: PA required; PA submitted to [employer] via CoverMyMeds Key/confirmation #/EOC [id number] Status is pending

## 2023-04-01 ENCOUNTER — Other Ambulatory Visit (HOSPITAL_COMMUNITY): Payer: Self-pay

## 2023-04-01 NOTE — Telephone Encounter (Signed)
Pharmacy Patient Advocate Encounter  Received notification from [employer] that Prior Authorization for Tadalafil has been APPROVED from 03/01/2023 to 03/30/2024. Ran test claim, Copay is $2.80 for 8 tablets for 16 day supply, quantity limits apply  PA #/Case ID/Reference #: [id number]

## 2023-04-06 ENCOUNTER — Encounter: Payer: Self-pay | Admitting: Gastroenterology

## 2023-04-06 ENCOUNTER — Ambulatory Visit: Payer: Medicare Other | Admitting: Gastroenterology

## 2023-04-06 VITALS — BP 100/60 | HR 50 | Ht 70.0 in | Wt 197.0 lb

## 2023-04-06 DIAGNOSIS — K2270 Barrett's esophagus without dysplasia: Secondary | ICD-10-CM

## 2023-04-06 DIAGNOSIS — K625 Hemorrhage of anus and rectum: Secondary | ICD-10-CM

## 2023-04-06 MED ORDER — NA SULFATE-K SULFATE-MG SULF 17.5-3.13-1.6 GM/177ML PO SOLN: 1.0000 | Freq: Once | ORAL | 0 refills | Status: AC

## 2023-04-06 NOTE — Patient Instructions (Signed)
You have been scheduled for a colonoscopy. Please follow written instructions given to you at your visit today.   Please pick up your prep supplies at the pharmacy within the next 1-3 days.  If you use inhalers (even only as needed), please bring them with you on the day of your procedure.  DO NOT TAKE 7 DAYS PRIOR TO TEST- Trulicity (dulaglutide) Ozempic, Wegovy (semaglutide) Mounjaro (tirzepatide) Bydureon Bcise (exanatide extended release)  DO NOT TAKE 1 DAY PRIOR TO YOUR TEST Rybelsus (semaglutide) Adlyxin (lixisenatide) Victoza (liraglutide) Byetta (exanatide) ___________________________________________________________________________   _______________________________________________________  If your blood pressure at your visit was 140/90 or greater, please contact your primary care physician to follow up on this.  _______________________________________________________  If you are age 14 or older, your body mass index should be between 23-30. Your Body mass index is 28.27 kg/m. If this is out of the aforementioned range listed, please consider follow up with your Primary Care Provider.  If you are age 63 or younger, your body mass index should be between 19-25. Your Body mass index is 28.27 kg/m. If this is out of the aformentioned range listed, please consider follow up with your Primary Care Provider.   ________________________________________________________  The [facility] providers would like to encourage you to use [facility] to communicate with providers for non-urgent requests or questions.  Due to long hold times on the telephone, sending your provider a message by [facility] may be a faster and more efficient way to get a response.  Please allow 48 business hours for a response.  Please remember that this is for non-urgent requests.  _______________________________________________________   Follow up per recommendations after Colonoscopy  Due to recent changes in  healthcare laws, you may see the results of your imaging and laboratory studies on MyChart before your provider has had a chance to review them.  We understand that in some cases there may be results that are confusing or concerning to you. Not all laboratory results come back in the same time frame and the provider may be waiting for multiple results in order to interpret others.  Please give [place] 48 hours in order for your provider to thoroughly review all the results before contacting the office for clarification of your results.    I appreciate the  opportunity to care for you  Thank You   [facility]

## 2023-04-06 NOTE — Progress Notes (Signed)
Chief Complaint: Rectal bleeding Hospital follow-up Primary GI MD: Dr. Russella Dar  HPI: 82 year old male history of CAD, Barrett's esophagus, diverticulosis, presents for evaluation of hospital follow-up.  Patient recently seen at [facility] 03/08/2023 for lower GI bleed.  Patient states he has had intermittent rectal bleeding for many years.  States it typically comes on and lasts for few days and then improves on its own.  States this time he was having a warm feeling in his lower abdomen and need to have a bowel movement.  When he went to the bathroom he was having episodes of passing dark red blood.  This began to improve but he still went to emergency department for further evaluation.  Workup in emergency department showed CT scan abdomen pelvis with contrast showed umbilical hernia, cholelithiasis, colonic diverticulosis without diverticulitis, prostatomegaly, and AAA (3.6 cm).  CBC was negative for anemia with Hgb 13.0.  BUN 12, creatinine 1.08.  Patient states he has not had rectal bleeding since prior to emergency department visit and is doing well today.  Denies abdominal pain, melena, hematochezia, nausea, vomiting, and weight loss.  Patient's last colonoscopy was in 2014 done as an inpatient for rectal bleeding.  Was suspected to have a diverticular bleed.  See report below.  Patient does have a history of Barrett's esophagus and has had multiple surveillance EGDs in the past.  Currently due for repeat July 2025.  Well-controlled on omeprazole 40 Mg daily  Echocardiogram 07/2019 with ejection fraction 60 to 65%  PREVIOUS GI WORKUP   EGD July 2022 in [facility] - Esophageal mucosal changes secondary to established long-segment (4 cm) Barrett's. Biopsied. - No endoscopic esophageal abnormality to explain patient's dysphagia. Dilated. - Small hiatal hernia. - Normal duodenal bulb and second portion of the duodenum.  EGD 09/2017 - Esophageal mucosal changes secondary to  established long- segment Barrett' s disease. Biopsied.  - No endoscopic esophageal abnormality to explain patient' s dysphagia. Esophagus dilated. Dilated.  - Small hiatal hernia.  - Normal duodenal bulb and second portion of the duodenum.  EGD 2014, 2011, and 2008  Colonoscopy 2014 done as inpatient for rectal bleeding - Multiple diverticula throughout colon, most densely populated in left colon.  No active bleeding.  Recent red blood in colon.   Past Medical History:  Diagnosis Date   Acute lower GI bleeding 01/25/2013; 09/2014   diverticular bleed   Acute prostatitis 02/21/2019   ANXIETY 08/22/2008   Arthritis    "a little bit" (04/14/2015)   Barrett's esophagus 2003   hematochezia as a result since 2003   Basal cell cancer    removed from right forearm.    Bradycardia 04/14/2015   CORONARY ARTERY DISEASE    2 stents   Depression    DIVERTICULOSIS, COLON 2003   ERECTILE DYSFUNCTION 08/22/2008   GERD (gastroesophageal reflux disease)    Heart murmur 04/14/2015   "they think I did; that's why I'm here" (04/14/2015)   History of kidney stones    HYPERLIPIDEMIA 02/09/2007   Kidney stones    "blasted or passed" (04/14/2015)   MI (myocardial infarction) (HCC) 2000   Numbness    right side - history of    OBESITY 03/20/2010   Squamous cell cancer of skin of earlobe    removed from area behind right earlobe   Stroke (HCC)    4 yrs ago possible mini stroke -  tests never found anything    Past Surgical History:  Procedure Laterality Date  BASAL CELL CARCINOMA EXCISION     posterior right ear.    CATARACT EXTRACTION, BILATERAL     COLONOSCOPY N/A 01/26/2013   Procedure: COLONOSCOPY;  Surgeon: Rachael Fee, MD;  Location: [facility];  Service: Endoscopy;  Laterality: N/A;   CORONARY ANGIOPLASTY WITH STENT PLACEMENT  09/06/1998   LAD stent per Dr Chales Abrahams in [place]. 2 stents.    CYSTOSCOPY W/ URETERAL STENT PLACEMENT Left 12/21/2016   Procedure: CYSTOSCOPY WITH RETROGRADE  PYELOGRAM/URETERAL STENT PLACEMENT;  Surgeon: Sebastian Ache, MD;  Location: [facility];  Service: Urology;  Laterality: Left;   ESOPHAGOGASTRODUODENOSCOPY (EGD) WITH ESOPHAGEAL DILATION     "q time I have an EGD; usually q 3-4 years" (04/14/2015)   EXTRACORPOREAL SHOCK WAVE LITHOTRIPSY Left 12/30/2016   Procedure: LEFT EXTRACORPOREAL SHOCK WAVE LITHOTRIPSY (ESWL);  Surgeon: Jethro Bolus, MD;  Location: [facility];  Service: Urology;  Laterality: Left;   LITHOTRIPSY  X 2   SQUAMOUS CELL CARCINOMA EXCISION     right forearm   TONSILLECTOMY  1971   TOTAL KNEE ARTHROPLASTY Left 03/21/2018   TOTAL KNEE ARTHROPLASTY Left 03/21/2018   Procedure: LEFT TOTAL KNEE ARTHROPLASTY;  Surgeon: Sheral Apley, MD;  Location: [facility];  Service: Orthopedics;  Laterality: Left;   UPPER GASTROINTESTINAL ENDOSCOPY     URETERAL STENT PLACEMENT     2019    Current Outpatient Medications  Medication Sig Dispense Refill   aspirin EC 81 MG tablet Take 1 tablet (81 mg total) by mouth 2 (two) times daily. For DVT prophylaxis (Patient taking differently: Take 81 mg by mouth daily. For DVT prophylaxis) 60 tablet 0   loperamide (IMODIUM) 2 MG capsule Take 1 capsule (2 mg total) by mouth 2 (two) times daily as needed for diarrhea or loose stools. 14 capsule 0   omeprazole (PRILOSEC) 40 MG capsule Take 1 capsule (40 mg total) by mouth daily. 90 capsule 3   ondansetron (ZOFRAN) 4 MG tablet Take 1 tablet (4 mg total) by mouth every 6 (six) hours as needed for nausea or vomiting. 14 tablet 0   rosuvastatin (CRESTOR) 20 MG tablet Take 1 tablet (20 mg total) by mouth daily. 90 tablet 3   tadalafil (CIALIS) 20 MG tablet TAKE ONE-HALF (1/2) TABLET DAILY AS NEEDED FOR ERECTILE DYSFUNCTION 90 tablet 3   traMADol (ULTRAM) 50 MG tablet Take 1-2 tablets (50-100 mg total) by mouth 2 (two) times daily as needed for severe pain (do not drive 8 hours after taking). 60 tablet 5   No current facility-administered medications for this visit.     Allergies as of 04/06/2023 - Review Complete 03/28/2023  Allergen Reaction Noted   Doxycycline  07/17/2019   Lipitor [atorvastatin]  11/07/2009    Family History  Problem Relation Age of Onset   Alcohol abuse Father    Liver disease Father    Heart disease Brother        CABG   Colon cancer Neg Hx     Social History   Socioeconomic History   Marital status: Widowed    Spouse name: Not on file   Number of children: Not on file   Years of education: Not on file   Highest education level: Not on file  Occupational History   Not on file  Tobacco Use   Smoking status: Former    Current packs/day: 0.00    Average packs/day: 1 pack/day for 30.0 years (30.0 ttl pk-yrs)    Types: Cigarettes    Start date: 08/30/1958  Quit date: 08/30/1988    Years since quitting: 34.6   Smokeless tobacco: Never   Tobacco comments:    "quit smoking cigarettes in ~ 1990"  Vaping Use   Vaping status: Never Used  Substance and Sexual Activity   Alcohol use: Yes    Comment: soccially   Drug use: No   Sexual activity: Not on file  Other Topics Concern   Not on file  Social History Narrative   Still working at [employer]   Married 35 years in 2015, 4 kids from first marriage, 3 grandkids   Hobbies: woodworking, tv movies- old action movies like Rambo   Social Determinants of Health   Financial Resource Strain: Low Risk  (02/14/2023)   Overall Financial Resource Strain (CARDIA)    Difficulty of Paying Living Expenses: Not hard at all  Food Insecurity: No Food Insecurity (02/14/2023)   Hunger Vital Sign    Worried About Running Out of Food in the Last Year: Never true    Ran Out of Food in the Last Year: Never true  Transportation Needs: No Transportation Needs (02/14/2023)   PRAPARE - Administrator, Civil Service (Medical): No    Lack of Transportation (Non-Medical): No  Physical Activity: Inactive (02/14/2023)   Exercise Vital Sign    Days of Exercise per Week: 0  days    Minutes of Exercise per Session: 0 min  Stress: No Stress Concern Present (02/14/2023)   [employer] of Occupational Health - Occupational Stress Questionnaire    Feeling of Stress : Only a little  Social Connections: Socially Isolated (02/14/2023)   Social Connection and Isolation Panel [NHANES]    Frequency of Communication with Friends and Family: More than three times a week    Frequency of Social Gatherings with Friends and Family: More than three times a week    Attends Religious Services: Never    Database administrator or Organizations: No    Attends Banker Meetings: Never    Marital Status: Widowed  Intimate Partner Violence: Not At Risk (02/14/2023)   Humiliation, Afraid, Rape, and Kick questionnaire    Fear of Current or Ex-Partner: No    Emotionally Abused: No    Physically Abused: No    Sexually Abused: No    Review of Systems:    Constitutional: No weight loss, fever, chills, weakness or fatigue HEENT: Eyes: No change in vision               Ears, Nose, Throat:  No change in hearing or congestion Skin: No rash or itching Cardiovascular: No chest pain, chest pressure or palpitations   Respiratory: No SOB or cough Gastrointestinal: See HPI and otherwise negative Genitourinary: No dysuria or change in urinary frequency Neurological: No headache, dizziness or syncope Musculoskeletal: No new muscle or joint pain Hematologic: No bleeding or bruising Psychiatric: No history of depression or anxiety    Physical Exam:  Vital signs: There were no vitals taken for this visit.  Constitutional: NAD, Well developed, Well nourished, alert and cooperative Head:  Normocephalic and atraumatic. Eyes:   PEERL, EOMI. No icterus. Conjunctiva pink. Respiratory: Respirations even and unlabored. Lungs clear to auscultation bilaterally.   No wheezes, crackles, or rhonchi.  Cardiovascular:  Regular rate and rhythm. No peripheral edema, cyanosis or pallor.   Gastrointestinal:  Soft, nondistended, nontender. No rebound or guarding. Normal bowel sounds. No appreciable masses or hepatomegaly. Rectal:  Not performed.  Msk:  Symmetrical without gross deformities.  Without edema, no deformity or joint abnormality.  Neurologic:  Alert and  oriented x4;  grossly normal neurologically.  Skin:   Dry and intact without significant lesions or rashes. Psychiatric: Oriented to person, place and time. Demonstrates good judgement and reason without abnormal affect or behaviors.   RELEVANT LABS AND IMAGING: CBC    Component Value Date/Time   WBC 4.4 03/28/2023 1602   RBC 4.43 03/28/2023 1602   HGB 13.0 03/28/2023 1602   HCT 37.2 (L) 03/28/2023 1602   PLT 156 03/28/2023 1602   MCV 84.0 03/28/2023 1602   MCH 29.3 03/28/2023 1602   MCHC 34.9 03/28/2023 1602   RDW 13.4 03/28/2023 1602   LYMPHSABS 1.2 03/28/2023 1602   MONOABS 0.8 03/28/2023 1602   EOSABS 0.1 03/28/2023 1602   BASOSABS 0.1 03/28/2023 1602    CMP     Component Value Date/Time   NA 134 (L) 03/28/2023 1602   K 3.6 03/28/2023 1602   CL 102 03/28/2023 1602   CO2 22 03/28/2023 1602   GLUCOSE 102 (H) 03/28/2023 1602   GLUCOSE 96 06/10/2006 1029   BUN 12 03/28/2023 1602   CREATININE 1.08 03/28/2023 1602   CREATININE 1.05 06/11/2020 1610   CALCIUM 9.2 03/28/2023 1602   PROT 7.0 03/28/2023 1602   ALBUMIN 3.8 03/28/2023 1602   AST 20 03/28/2023 1602   ALT 16 03/28/2023 1602   ALKPHOS 73 03/28/2023 1602   BILITOT 0.8 03/28/2023 1602   GFRNONAA >60 03/28/2023 1602   GFRNONAA 69 06/11/2020 1610   GFRAA 80 06/11/2020 1610     Assessment/Plan:   Rectal bleeding Patient has had intermittent rectal bleeding ongoing for many years.  Recently seen in emergency department with CT scan that was unrevealing.  Suspect patient experienced diverticular bleed since it occurred for a few days and improved on its own.  However, since last colonoscopy was in 2014 cannot rule out other  etiologies. --- Colonoscopy for further evaluation --- I thoroughly discussed the procedure with the patient (at bedside) to include nature of the procedure, alternatives, benefits, and risks (including but not limited to bleeding, infection, perforation, anesthesia/cardiac pulmonary complications).  Patient verbalized understanding and gave verbal consent to proceed with procedure.  --- Can be done in the [facility] based on his echocardiogram.  Patient is in overall decent health for his age and with a longstanding history of rectal bleeding and last colonoscopy being 10 years ago, benefits appear to outweigh risks.  Barrett's esophagus Well-controlled on omeprazole 40 Mg once daily - Due for repeat EGD July 2025  Boone Master, [facility] 04/06/2023, 9:41 AM  Cc: Shelva Majestic, MD

## 2023-05-04 ENCOUNTER — Ambulatory Visit (AMBULATORY_SURGERY_CENTER): Payer: Medicare Other | Admitting: Gastroenterology

## 2023-05-04 ENCOUNTER — Encounter: Payer: Self-pay | Admitting: Gastroenterology

## 2023-05-04 VITALS — BP 98/49 | HR 54 | Temp 98.4°F | Resp 17 | Ht 70.0 in | Wt 197.0 lb

## 2023-05-04 DIAGNOSIS — K921 Melena: Secondary | ICD-10-CM | POA: Diagnosis not present

## 2023-05-04 DIAGNOSIS — K5730 Diverticulosis of large intestine without perforation or abscess without bleeding: Secondary | ICD-10-CM | POA: Diagnosis not present

## 2023-05-04 DIAGNOSIS — K640 First degree hemorrhoids: Secondary | ICD-10-CM | POA: Diagnosis not present

## 2023-05-04 MED ORDER — SODIUM CHLORIDE 0.9 % IV SOLN
500.0000 mL | Freq: Once | INTRAVENOUS | Status: DC
Start: 2023-05-04 — End: 2023-05-04

## 2023-05-04 NOTE — Op Note (Signed)
[facility] Patient Name: Unice Angier Procedure Date: 05/04/2023 3:05 PM MRN: [id number] Endoscopist: Meryl Dare , MD, [phone] Age: 82 Referring MD:  Date of Birth: 1941-06-23 Gender: Male Account #: [id number] Procedure:                Colonoscopy Indications:              Hematochezia Medicines:                Monitored Anesthesia Care Procedure:                Pre-Anesthesia Assessment:                           - Prior to the procedure, a History and Physical                            was performed, and patient medications and                            allergies were reviewed. The patient's tolerance of                            previous anesthesia was also reviewed. The risks                            and benefits of the procedure and the sedation                            options and risks were discussed with the patient.                            All questions were answered, and informed consent                            was obtained. Prior Anticoagulants: The patient has                            taken no anticoagulant or antiplatelet agents. ASA                            Grade Assessment: II - A patient with mild systemic                            disease. After reviewing the risks and benefits,                            the patient was deemed in satisfactory condition to                            undergo the procedure.                           After obtaining informed consent, the colonoscope  was passed under direct vision. Throughout the                            procedure, the patient's blood pressure, pulse, and                            oxygen saturations were monitored continuously. The                            Olympus CF-HQ190L ([id number]) Colonoscope was                            introduced through the anus and advanced to the the                            cecum, identified by appendiceal orifice and                             ileocecal valve. The ileocecal valve, appendiceal                            orifice, and rectum were photographed. The quality                            of the bowel preparation was adequate. The                            colonoscopy was performed without difficulty. The                            patient tolerated the procedure well. Scope In: 3:15:30 PM Scope Out: 3:30:38 PM Scope Withdrawal Time: 0 hours 11 minutes 45 seconds  Total Procedure Duration: 0 hours 15 minutes 8 seconds  Findings:                 The perianal and digital rectal examinations were                            normal.                           Multiple medium-mouthed and small-mouthed                            diverticula were found in the left colon. There was                            evidence of diverticular spasm. There was no                            evidence of diverticular bleeding.                           Internal hemorrhoids were found during  retroflexion. The hemorrhoids were small and Grade                            I (internal hemorrhoids that do not prolapse).                           The exam was otherwise without abnormality on                            direct and retroflexion views. Complications:            No immediate complications. Estimated blood loss:                            None. Estimated Blood Loss:     Estimated blood loss: none. Impression:               - Moderate diverticulosis in the left colon.                           - Internal hemorrhoids.                           - The examination was otherwise normal on direct                            and retroflexion views.                           - No specimens collected. Recommendation:           - Patient has a contact number available for                            emergencies. The signs and symptoms of potential                            delayed complications  were discussed with the                            patient. Return to normal activities tomorrow.                            Written discharge instructions were provided to the                            patient.                           - Resume previous diet with increased fiber.                           - Continue present medications.                           - No repeat colonoscopy due to age and the absence  of colonic polyps.                           - Follow up with PCP. Meryl Dare, MD 05/04/2023 3:35:23 PM This report has been signed electronically.

## 2023-05-04 NOTE — Progress Notes (Signed)
Vss nad trans to pacu 

## 2023-05-04 NOTE — Patient Instructions (Addendum)
-   Resume previous diet with increased fiber. - Continue present medications. - No repeat colonoscopy due to age and the absence of colonic polyps. - Follow up with PCP.  YOU HAD AN ENDOSCOPIC PROCEDURE TODAY AT THE [facility]:   Refer to the procedure report that was given to you for any specific questions about what was found during the examination.  If the procedure report does not answer your questions, please call your gastroenterologist to clarify.  If you requested that your care partner not be given the details of your procedure findings, then the procedure report has been included in a sealed envelope for you to review at your convenience later.  YOU SHOULD EXPECT: Some feelings of bloating in the abdomen. Passage of more gas than usual.  Walking can help get rid of the air that was put into your GI tract during the procedure and reduce the bloating. If you had a lower endoscopy (such as a colonoscopy or flexible sigmoidoscopy) you may notice spotting of blood in your stool or on the toilet paper. If you underwent a bowel prep for your procedure, you may not have a normal bowel movement for a few days.  Please Note:  You might notice some irritation and congestion in your nose or some drainage.  This is from the oxygen used during your procedure.  There is no need for concern and it should clear up in a day or so.  SYMPTOMS TO REPORT IMMEDIATELY:  Following lower endoscopy (colonoscopy or flexible sigmoidoscopy):  Excessive amounts of blood in the stool  Significant tenderness or worsening of abdominal pains  Swelling of the abdomen that is new, acute  Fever of 100F or higher  For urgent or emergent issues, a gastroenterologist can be reached at any hour by calling [phone]. Do not use MyChart messaging for urgent concerns.    DIET:  We do recommend a small meal at first, but then you may proceed to your regular diet.  Drink plenty of fluids but you should avoid  alcoholic beverages for 24 hours.  ACTIVITY:  You should plan to take it easy for the rest of today and you should NOT DRIVE or use heavy machinery until tomorrow (because of the sedation medicines used during the test).    FOLLOW UP: Our staff will call the number listed on your records the next business day following your procedure.  We will call around 7:15- 8:00 am to check on you and address any questions or concerns that you may have regarding the information given to you following your procedure. If we do not reach you, we will leave a message.     If any biopsies were taken you will be contacted by phone or by letter within the next 1-3 weeks.  Please call us at [phone] if you have not heard about the biopsies in 3 weeks.    SIGNATURES/CONFIDENTIALITY: You and/or your care partner have signed paperwork which will be entered into your electronic medical record.  These signatures attest to the fact that that the information above on your After Visit Summary has been reviewed and is understood.  Full responsibility of the confidentiality of this discharge information lies with you and/or your care-partner.

## 2023-05-04 NOTE — Progress Notes (Signed)
See 04/06/2023 H&P no changes

## 2023-05-05 ENCOUNTER — Telehealth: Payer: Self-pay | Admitting: *Deleted

## 2023-05-05 NOTE — Telephone Encounter (Signed)
  Follow up Call-     05/04/2023    2:12 PM 03/19/2021    9:27 AM  Call back number  Post procedure Call Back phone  # [phone]  Permission to leave phone message Yes Yes     Patient questions:  Do you have a fever, pain , or abdominal swelling? No. Pain Score  0 *  Have you tolerated food without any problems? Yes.    Have you been able to return to your normal activities? Yes.    Do you have any questions about your discharge instructions: Diet   No. Medications  No. Follow up visit  No.  Do you have questions or concerns about your Care? No.  Actions: * If pain score is 4 or above: No action needed, pain <4.

## 2023-05-10 DIAGNOSIS — I358 Other nonrheumatic aortic valve disorders: Secondary | ICD-10-CM | POA: Insufficient documentation

## 2023-05-10 DIAGNOSIS — E785 Hyperlipidemia, unspecified: Secondary | ICD-10-CM | POA: Diagnosis not present

## 2023-05-10 DIAGNOSIS — I10 Essential (primary) hypertension: Secondary | ICD-10-CM | POA: Diagnosis not present

## 2023-05-10 DIAGNOSIS — I2510 Atherosclerotic heart disease of native coronary artery without angina pectoris: Secondary | ICD-10-CM | POA: Diagnosis not present

## 2023-06-10 ENCOUNTER — Telehealth: Payer: Self-pay | Admitting: Family Medicine

## 2023-06-10 NOTE — Telephone Encounter (Signed)
Prescription Request  06/10/2023  LOV: 12/23/2022  What is the name of the medication or equipment? traMADol (ULTRAM) 50 MG tablet   Have you contacted your pharmacy to request a refill? Yes   Which pharmacy would you like this sent to?  [id number] [place], [place] - [address] [place] [address] Phone: [phone] Fax: [phone]      Patient notified that their request is being sent to the clinical staff for review and that they should receive a response within 2 business days.   Please advise at Mobile [phone] (mobile)

## 2023-06-11 ENCOUNTER — Other Ambulatory Visit: Payer: Self-pay | Admitting: Family Medicine

## 2023-06-14 ENCOUNTER — Other Ambulatory Visit: Payer: Self-pay | Admitting: Family

## 2023-06-14 MED ORDER — TRAMADOL HCL 50 MG PO TABS: 50.0000 mg | ORAL_TABLET | Freq: Two times a day (BID) | ORAL | 5 refills | Status: DC | PRN

## 2023-06-14 NOTE — Telephone Encounter (Signed)
You have filled this but I dont have access to deny/remove it from my basket.

## 2023-06-17 DIAGNOSIS — N5201 Erectile dysfunction due to arterial insufficiency: Secondary | ICD-10-CM | POA: Diagnosis not present

## 2023-06-17 DIAGNOSIS — N401 Enlarged prostate with lower urinary tract symptoms: Secondary | ICD-10-CM | POA: Diagnosis not present

## 2023-06-17 DIAGNOSIS — R351 Nocturia: Secondary | ICD-10-CM | POA: Diagnosis not present

## 2023-07-07 ENCOUNTER — Encounter: Payer: Self-pay | Admitting: Family Medicine

## 2023-07-07 ENCOUNTER — Ambulatory Visit (INDEPENDENT_AMBULATORY_CARE_PROVIDER_SITE_OTHER): Payer: Medicare Other | Admitting: Family Medicine

## 2023-07-07 VITALS — BP 130/80 | HR 78 | Temp 97.7°F | Ht 70.0 in | Wt 197.6 lb

## 2023-07-07 DIAGNOSIS — E785 Hyperlipidemia, unspecified: Secondary | ICD-10-CM | POA: Diagnosis not present

## 2023-07-07 DIAGNOSIS — Z131 Encounter for screening for diabetes mellitus: Secondary | ICD-10-CM | POA: Diagnosis not present

## 2023-07-07 DIAGNOSIS — I2510 Atherosclerotic heart disease of native coronary artery without angina pectoris: Secondary | ICD-10-CM

## 2023-07-07 DIAGNOSIS — R739 Hyperglycemia, unspecified: Secondary | ICD-10-CM | POA: Diagnosis not present

## 2023-07-07 NOTE — Patient Instructions (Addendum)
Please stop by lab before you go If you have mychart- we will send your results within 3 business days of [place] receiving them.  If you do not have mychart- we will call you about results within 5 business days of [place] receiving them.  *please also note that you will see labs on mychart as soon as they post. I will later go in and write notes on them- will say "notes from Dr. Durene Cal"   Mild weight loss encouraged  Recommended follow up: Return in about 6 months (around 01/04/2024) for followup or sooner if needed.Schedule b4 you leave.

## 2023-07-07 NOTE — Progress Notes (Signed)
Phone [phone] In person visit   Subjective:   Jose [name] is a 82 y.o. year old very pleasant male patient who presents for/with See problem oriented charting Chief Complaint  Patient presents with   Medical Management of Chronic Issues   Hyperlipidemia   Past Medical History-  Patient Active Problem List   Diagnosis Date Noted   History of COVID-19.  Hospitalization November 2020 with acute respiratory failure. 07/09/2019    Priority: High   Aortic aneurysm (HCC) 06/04/2019    Priority: High   CAD (coronary artery disease) 02/09/2007    Priority: High   Barrett's esophagus 02/09/2007    Priority: High   Vitamin B12 deficiency 12/17/2020    Priority: Medium    Fatty liver 06/04/2019    Priority: Medium    GERD (gastroesophageal reflux disease) 10/23/2018    Priority: Medium    Primary osteoarthritis of left knee 02/27/2018    Priority: Medium    Elevated PSA 08/03/2016    Priority: Medium    Benign prostatic hyperplasia with nocturia 05/27/2016    Priority: Medium    Hyperglycemia 02/26/2013    Priority: Medium    Hyperlipidemia 02/09/2007    Priority: Medium    Aortic atherosclerosis (HCC) 06/04/2019    Priority: Low   Acute prostatitis 02/21/2019    Priority: Low   Nasal congestion 10/23/2018    Priority: Low   Lumbosacral radiculopathy 06/17/2015    Priority: Low   Numbness 06/17/2015    Priority: Low   Right leg numbness     Priority: Low   Sinus bradycardia 03/19/2015    Priority: Low   Diverticulosis of colon with hemorrhage     Priority: Low   Actinic keratosis 08/07/2014    Priority: Low   Elevated blood pressure 03/29/2014    Priority: Low   OBESITY 03/20/2010    Priority: Low   Generalized anxiety disorder 08/22/2008    Priority: Low   ERECTILE DYSFUNCTION 08/22/2008    Priority: Low   Diverticulosis of large intestine 02/09/2007    Priority: Low   Gastrointestinal hemorrhage 10/15/2014    Medications- reviewed and  updated Current Outpatient Medications  Medication Sig Dispense Refill   aspirin EC 81 MG tablet Take 81 mg by mouth daily. Swallow whole.     loperamide (IMODIUM) 2 MG capsule Take 1 capsule (2 mg total) by mouth 2 (two) times daily as needed for diarrhea or loose stools. 14 capsule 0   omeprazole (PRILOSEC) 40 MG capsule Take 1 capsule (40 mg total) by mouth daily. 90 capsule 3   rosuvastatin (CRESTOR) 20 MG tablet Take 1 tablet (20 mg total) by mouth daily. 90 tablet 3   tadalafil (CIALIS) 20 MG tablet TAKE ONE-HALF (1/2) TABLET DAILY AS NEEDED FOR ERECTILE DYSFUNCTION 90 tablet 3   ondansetron (ZOFRAN) 4 MG tablet Take 1 tablet (4 mg total) by mouth every 6 (six) hours as needed for nausea or vomiting. (Patient not taking: Reported on 07/07/2023) 14 tablet 0   traMADol (ULTRAM) 50 MG tablet Take 1-2 tablets (50-100 mg total) by mouth 2 (two) times daily as needed for severe pain (pain score 7-10) (do not drive 8 hours after taking). (Patient not taking: Reported on 07/07/2023) 60 tablet 5   No current facility-administered medications for this visit.     Objective:  BP 130/80   Pulse 78   Temp 97.7 F (36.5 C)   Ht 5\' 10"  (1.778 m)   Wt 197 lb 9.6 oz (89.6  kg)   SpO2 95%   BMI 28.35 kg/m  Gen: NAD, resting comfortably CV: RRR no murmurs rubs or gallops Lungs: CTAB no crackles, wheeze, rhonchi Ext: no edema Skin: warm, dry    Assessment and Plan   #social update- lost wife september 2023 to aortic aneurysm- he is making it day by day- they were married 40 years.   # Suspected diverticular bleed-ED visit 03/28/2023 with GI follow-up on 04/06/2023 with plan for colonoscopy which was completed on 05/04/2023 -bleeding was 3 days this time which is usually the point he goes in and that is what started this evaluation  #Fall- 6 weeks ago fell on left ribs and pain almost gone at this point. Got tangled up with his feet- fell on knees first and then onto left chest  #Insomnia- some mild  issues- not at point he wants to take medicine- takes OTC (available over the counter without a prescription) relaxium  #Hyperglycemia-peak A1c 5.8 s: Diet controlled in the past. Getting some walking in. Weight stable. Walked for 3 hours with granddaughter on halloween Lab Results  Component Value Date   HGBA1C 5.7 12/23/2022   HGBA1C 5.7 06/22/2022   HGBA1C 5.7 12/21/2021  A/P: hopefully stable or improved- update a1c today. Continue current meds for now    #CAD-followed in [facility] with history of stent- remains on aspirin 81 mg #Hyperlipidemia  Ideal LDL under 70 s: Medication: Rosuvastatin 20 mg, aspirin 81 mg No chest pain or shortness of breath outside of rib pain Lab Results  Component Value Date   CHOL 107 12/23/2022   HDL 40.90 12/23/2022   LDLCALC 55 12/23/2022   LDLDIRECT 100.0 12/19/2017   TRIG 56.0 12/23/2022   CHOLHDL 3 12/23/2022  A/P:coronary artery disease asymptomatic continue current medications Lipids at goal- continue current medications  #Left knee pain  S: History of left knee replacement-with continued pain in the left knee despite this patient using 1 tramadol in the morning and 1 in the evening for his knees.  We received a letter from [facility] in 2020 requesting that we begin his long-term tramadol prescription. A/P:did have fall onto the left knee and had some increased pain short term but not enough to get x-ray to rule out fracture. Tramadol helpful- continue current medications   #GERD/Barrett's esophagus S: doing well on omeprazole 40mg  per Dr. Russella Dar.  A/P: doing well no flares- continue current medications   # Fatty liver S:  Noted on CT 04/20/2019.  Lab Results  Component Value Date   ALT 16 03/28/2023   AST 20 03/28/2023   ALKPHOS 73 03/28/2023   BILITOT 0.8 03/28/2023  A/P: suspect stable-encouraged weight loss - also will reduce risk of developing diabetes  Recommended follow up: Return in about 6 months (around 01/04/2024) for  followup or sooner if needed.Schedule b4 you leave. Future Appointments  Date Time Provider Department Center  02/20/2024  3:00 PM [facility] ANNUAL WELLNESS VISIT 1 [facility]    Lab/Order associations:   ICD-10-CM   1. Hyperglycemia  R73.9 HgB A1c    2. Screening for diabetes mellitus  Z13.1 HgB A1c    3. Coronary artery disease involving native coronary artery of native heart without angina pectoris  I25.10 Comprehensive metabolic panel    CBC with Differential/Platelet    4. Hyperlipidemia, unspecified hyperlipidemia type  E78.5 Comprehensive metabolic panel    CBC with Differential/Platelet      No orders of the defined types were placed in this encounter.   Return  precautions advised.  Tana Conch, MD

## 2023-07-08 LAB — CBC WITH DIFFERENTIAL/PLATELET
Basophils Absolute: 0 10*3/uL (ref 0.0–0.1)
Basophils Relative: 0.8 % (ref 0.0–3.0)
Eosinophils Absolute: 0.1 10*3/uL (ref 0.0–0.7)
Eosinophils Relative: 1.4 % (ref 0.0–5.0)
HCT: 43.3 % (ref 39.0–52.0)
Hemoglobin: 14.5 g/dL (ref 13.0–17.0)
Lymphocytes Relative: 20.2 % (ref 12.0–46.0)
Lymphs Abs: 1.2 10*3/uL (ref 0.7–4.0)
MCHC: 33.6 g/dL (ref 30.0–36.0)
MCV: 84.7 fL (ref 78.0–100.0)
Monocytes Absolute: 0.9 10*3/uL (ref 0.1–1.0)
Monocytes Relative: 15.5 % — ABNORMAL HIGH (ref 3.0–12.0)
Neutro Abs: 3.6 10*3/uL (ref 1.4–7.7)
Neutrophils Relative %: 62.1 % (ref 43.0–77.0)
Platelets: 171 10*3/uL (ref 150.0–400.0)
RBC: 5.11 Mil/uL (ref 4.22–5.81)
RDW: 14.3 % (ref 11.5–15.5)
WBC: 5.7 10*3/uL (ref 4.0–10.5)

## 2023-07-08 LAB — COMPREHENSIVE METABOLIC PANEL
ALT: 18 U/L (ref 0–53)
AST: 23 U/L (ref 0–37)
Albumin: 4.1 g/dL (ref 3.5–5.2)
Alkaline Phosphatase: 75 U/L (ref 39–117)
BUN: 16 mg/dL (ref 6–23)
CO2: 28 meq/L (ref 19–32)
Calcium: 9.7 mg/dL (ref 8.4–10.5)
Chloride: 106 meq/L (ref 96–112)
Creatinine, Ser: 1.26 mg/dL (ref 0.40–1.50)
GFR: 54.11 mL/min — ABNORMAL LOW (ref >60.00)
Glucose, Bld: 81 mg/dL (ref 70–99)
Potassium: 3.6 meq/L (ref 3.5–5.1)
Sodium: 140 meq/L (ref 135–145)
Total Bilirubin: 0.8 mg/dL (ref 0.2–1.2)
Total Protein: 7 g/dL (ref 6.0–8.3)

## 2023-07-08 LAB — HEMOGLOBIN A1C: Hgb A1c MFr Bld: 5.8 % (ref 4.6–6.5)

## 2023-09-28 DIAGNOSIS — M25512 Pain in left shoulder: Secondary | ICD-10-CM | POA: Diagnosis not present

## 2023-09-28 DIAGNOSIS — M542 Cervicalgia: Secondary | ICD-10-CM | POA: Diagnosis not present

## 2023-10-06 DIAGNOSIS — Z23 Encounter for immunization: Secondary | ICD-10-CM | POA: Diagnosis not present

## 2023-10-17 ENCOUNTER — Encounter: Payer: Self-pay | Admitting: Emergency Medicine

## 2023-10-17 ENCOUNTER — Ambulatory Visit
Admission: EM | Admit: 2023-10-17 | Discharge: 2023-10-17 | Disposition: A | Discharge status: Home / Self Care | Payer: Medicare Other | Attending: Family Medicine | Admitting: Family Medicine

## 2023-10-17 DIAGNOSIS — R051 Acute cough: Secondary | ICD-10-CM

## 2023-10-17 DIAGNOSIS — J111 Influenza due to unidentified influenza virus with other respiratory manifestations: Secondary | ICD-10-CM | POA: Diagnosis not present

## 2023-10-17 LAB — POC COVID19/FLU A&B COMBO
Covid Antigen, POC: NEGATIVE
Influenza A Antigen, POC: NEGATIVE
Influenza B Antigen, POC: NEGATIVE

## 2023-10-17 MED ORDER — OSELTAMIVIR PHOSPHATE 75 MG PO CAPS: 75.0000 mg | ORAL_CAPSULE | Freq: Two times a day (BID) | ORAL | 0 refills | Status: DC

## 2023-10-17 MED ORDER — PREDNISONE 20 MG PO TABS: 20.0000 mg | ORAL_TABLET | Freq: Every day | ORAL | 0 refills | Status: DC

## 2023-10-17 MED ORDER — BENZONATATE 100 MG PO CAPS: 200.0000 mg | ORAL_CAPSULE | Freq: Three times a day (TID) | ORAL | 0 refills | Status: DC | PRN

## 2023-10-17 NOTE — Discharge Instructions (Signed)
 Treating you for an influenza-like illness. You are considered contagious to others as long as you have a measurable fever with a temperature 100 F.  You should consider yourself infectious until you are fever free for 24 hours without fever lowering medications. Continue to alternate Tylenol and ibuprofen for management of fever.  Force fluids to maintain hydration. Tamiflu twice daily for the next 5 days to reduce symptoms and course of influenza virus.   If you develop any shortness of breath, wheezing or difficulty breathing go immediately to the nearest emergency department.

## 2023-10-17 NOTE — ED Triage Notes (Signed)
Pt presents with flu like symptoms that onset 10/15/23. Pt states he is not getting any rest from the excessive cough and congestion

## 2023-10-17 NOTE — ED Provider Notes (Signed)
[name]    CSN: [id number] Arrival date & time: 10/17/23  4098      History   Chief Complaint Chief Complaint  Patient presents with   Influenza    HPI Jose [name] is a 83 y.o. male.   Patient here with flu-like symptoms x 2 days. Patient endorses cough and congestion. Cough is interfering with sleep. Denies SOB. No history of chronic lung disease.  Reports he did receive his influenza vaccine this year.  He is unaware if he has had a fever.  Most worrisome symptom is cough and headache.  Not taking any medication for his symptoms. Past Medical History:  Diagnosis Date   Acute lower GI bleeding 01/25/2013; 09/2014   diverticular bleed   Acute prostatitis 02/21/2019   ANXIETY 08/22/2008   Arthritis    "a little bit" (04/14/2015)   Barrett's esophagus 2003   hematochezia as a result since 2003   Basal cell cancer    removed from right forearm.    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  Hospitalization November 2020 with acute respiratory failure. 07/09/2019   Aortic aneurysm (HCC) 06/04/2019   Fatty liver 06/04/2019   Aortic atherosclerosis (HCC) 06/04/2019    Acute prostatitis 02/21/2019   Dyslipidemia 12/28/2018   H/O heart artery stent 12/28/2018   GERD (gastroesophageal reflux disease) 10/23/2018   Nasal congestion 10/23/2018   Primary osteoarthritis of left knee 02/27/2018   Orbital fracture, closed, initial encounter (HCC) 12/15/2017   Decreased range of motion of finger of right hand 12/13/2017   Decreased range of motion of left wrist 12/13/2017   Pain and swelling of left wrist 12/13/2017   Pain in finger of right hand 12/13/2017   Elevated PSA 08/03/2016   Benign prostatic hyperplasia with nocturia 05/27/2016   Lumbosacral radiculopathy 06/17/2015   Numbness 06/17/2015   Right leg numbness    Sinus bradycardia 03/19/2015   Diverticulosis of colon with hemorrhage    Gastrointestinal hemorrhage 10/15/2014   Actinic keratosis 08/07/2014   Elevated blood pressure 03/29/2014   Essential hypertension 03/29/2014   Hyperglycemia 02/26/2013   OBESITY 03/20/2010   Generalized anxiety disorder 08/22/2008   ERECTILE DYSFUNCTION 08/22/2008   Hyperlipidemia 02/09/2007   Atherosclerosis of coronary artery 02/09/2007   Barrett's esophagus 02/09/2007   Diverticulosis of large intestine 02/09/2007    Past Surgical History:  Procedure Laterality Date   BASAL CELL CARCINOMA EXCISION     posterior right ear.    CATARACT EXTRACTION, BILATERAL     COLONOSCOPY N/A 01/26/2013  Procedure: COLONOSCOPY;  Surgeon: Rachael Fee, MD;  Location: [facility] ENDOSCOPY;  Service: Endoscopy;  Laterality: N/A;   CORONARY ANGIOPLASTY WITH STENT PLACEMENT  09/06/1998   LAD stent per Dr Chales Abrahams in [place]. 2 stents.    CYSTOSCOPY W/ URETERAL STENT PLACEMENT Left 12/21/2016   Procedure: CYSTOSCOPY WITH RETROGRADE PYELOGRAM/URETERAL STENT PLACEMENT;  Surgeon: Sebastian Ache, MD;  Location: [facility] ORS;  Service: Urology;  Laterality: Left;   ESOPHAGOGASTRODUODENOSCOPY (EGD) WITH ESOPHAGEAL DILATION     "q time I have an EGD; usually q 3-4 years" (04/14/2015)    EXTRACORPOREAL SHOCK WAVE LITHOTRIPSY Left 12/30/2016   Procedure: LEFT EXTRACORPOREAL SHOCK WAVE LITHOTRIPSY (ESWL);  Surgeon: Jethro Bolus, MD;  Location: [facility] ORS;  Service: Urology;  Laterality: Left;   LITHOTRIPSY  X 2   SQUAMOUS CELL CARCINOMA EXCISION     right forearm   TONSILLECTOMY  1971   TOTAL KNEE ARTHROPLASTY Left 03/21/2018   TOTAL KNEE ARTHROPLASTY Left 03/21/2018   Procedure: LEFT TOTAL KNEE ARTHROPLASTY;  Surgeon: Sheral Apley, MD;  Location: [facility] OR;  Service: Orthopedics;  Laterality: Left;   UPPER GASTROINTESTINAL ENDOSCOPY     URETERAL STENT PLACEMENT     2019       Home Medications    Prior to Admission medications   Medication Sig Start Date End Date Taking? Authorizing Provider  benzonatate (TESSALON PERLES) 100 MG capsule Take 2 capsules (200 mg total) by mouth 3 (three) times daily as needed for cough. 10/17/23  Yes Bing Neighbors, NP  oseltamivir (TAMIFLU) 75 MG capsule Take 1 capsule (75 mg total) by mouth 2 (two) times daily. 10/17/23  Yes Bing Neighbors, NP  predniSONE (DELTASONE) 20 MG tablet Take 1 tablet (20 mg total) by mouth daily with breakfast for 5 days. 10/17/23 10/22/23 Yes Bing Neighbors, NP  aspirin EC 81 MG tablet Take 81 mg by mouth daily. Swallow whole.    [provider]  loperamide (IMODIUM) 2 MG capsule Take 1 capsule (2 mg total) by mouth 2 (two) times daily as needed for diarrhea or loose stools. 04/19/21   Wallis Bamberg, PA-C  omeprazole (PRILOSEC) 40 MG capsule Take 1 capsule (40 mg total) by mouth daily. 12/23/22   Shelva Majestic, MD  ondansetron (ZOFRAN) 4 MG tablet Take 1 tablet (4 mg total) by mouth every 6 (six) hours as needed for nausea or vomiting. Patient not taking: Reported on 07/07/2023 12/23/22   Shelva Majestic, MD  rosuvastatin (CRESTOR) 20 MG tablet Take 1 tablet (20 mg total) by mouth daily. 12/23/22   Shelva Majestic, MD  tadalafil (CIALIS) 20 MG tablet TAKE ONE-HALF (1/2) TABLET DAILY AS NEEDED  FOR ERECTILE DYSFUNCTION 12/23/22   Shelva Majestic, MD  traMADol (ULTRAM) 50 MG tablet Take 1-2 tablets (50-100 mg total) by mouth 2 (two) times daily as needed for severe pain (pain score 7-10) (do not drive 8 hours after taking). Patient not taking: Reported on 07/07/2023 06/14/23   Eulis Foster, FNP    Family History Family History  Problem Relation Age of Onset   Alcohol abuse Father    Liver disease Father    Heart disease Brother        CABG   Colon cancer Neg Hx     Social History Social History   Tobacco Use   Smoking status: Former    Current packs/day: 0.00    Average packs/day: 1 pack/day for 30.0 years (30.0 ttl pk-yrs)    Types: Cigarettes  Start date: 08/30/1958    Quit date: 08/30/1988    Years since quitting: 35.1   Smokeless tobacco: Never   Tobacco comments:    "quit smoking cigarettes in ~ 1990"  Vaping Use   Vaping status: Never Used  Substance Use Topics   Alcohol use: Yes    Comment: soccially   Drug use: No     Allergies   Doxycycline and Lipitor [atorvastatin]   Review of Systems Review of Systems Pertinent negatives listed in HPI   Physical Exam Triage Vital Signs ED Triage Vitals  Encounter Vitals Group     BP 10/17/23 1022 (!) 145/83     Systolic BP Percentile --      Diastolic BP Percentile --      Pulse Rate 10/17/23 1022 (!) 58     Resp 10/17/23 1022 20     Temp 10/17/23 1022 98 F (36.7 C)     Temp Source 10/17/23 1022 Oral     SpO2 10/17/23 1022 97 %     Weight 10/17/23 1020 197 lb 8.5 oz (89.6 kg)     Height 10/17/23 1020 5\' 10"  (1.778 m)     Head Circumference --      Peak Flow --      Pain Score 10/17/23 1020 4     Pain Loc --      Pain Education --      Exclude from Growth Chart --    No data found.  Updated Vital Signs BP (!) 145/83 (BP Location: Left Arm)   Pulse (!) 58   Temp 98 F (36.7 C) (Oral)   Resp 20   Ht 5\' 10"  (1.778 m)   Wt 197 lb 8.5 oz (89.6 kg)   SpO2 97%   BMI 28.34 kg/m   Visual  Acuity Right Eye Distance:   Left Eye Distance:   Bilateral Distance:    Right Eye Near:   Left Eye Near:    Bilateral Near:     Physical Exam Vitals reviewed.  Constitutional:      Appearance: He is ill-appearing.  HENT:     Head: Normocephalic and atraumatic.     Nose: Congestion and rhinorrhea present.     Mouth/Throat:     Pharynx: No oropharyngeal exudate or posterior oropharyngeal erythema.  Eyes:     Extraocular Movements: Extraocular movements intact.     Conjunctiva/sclera: Conjunctivae normal.     Pupils: Pupils are equal, round, and reactive to light.  Cardiovascular:     Rate and Rhythm: Normal rate and regular rhythm.  Pulmonary:     Effort: Pulmonary effort is normal.     Breath sounds: Rhonchi present.  Musculoskeletal:        General: Normal range of motion.     Cervical back: Normal range of motion.  Skin:    General: Skin is warm.  Neurological:     General: No focal deficit present.     Mental Status: He is alert and oriented to person, place, and time.      UC Treatments / Results  Labs (all labs ordered are listed, but only abnormal results are displayed) Labs Reviewed  POC COVID19/FLU A&B COMBO - Normal    EKG   Radiology No results found.  Procedures Procedures (including critical care time)  Medications Ordered in UC Medications - No data to display  Initial Impression / Assessment and Plan / UC Course  I have reviewed the triage vital signs and the nursing notes.  Pertinent labs & imaging results that were available during my care of the patient were reviewed by me and considered in my medical decision making (see chart for details).    For influenza-like illness despite negative point-of-care testing.  Symptoms present less than 48 hours, Tamiflu prescribed treatment dose.  Prednisone 20 mg once daily for 5 days for chest and nasal inflammation.  Benzonatate Perles for cough as needed.  Return precautions given if symptoms do  not improve with completion of treatment.  ED precautions given if symptoms worsen or become severe Final Clinical Impressions(s) / UC Diagnoses   Final diagnoses:  Influenza-like illness  Acute cough     Discharge Instructions      Treating you for an influenza-like illness. You are considered contagious to others as long as you have a measurable fever with a temperature 100 F.  You should consider yourself infectious until you are fever free for 24 hours without fever lowering medications. Continue to alternate Tylenol and ibuprofen for management of fever.   Force fluids to maintain hydration. Tamiflu twice daily for the next 5 days to reduce symptoms and course of influenza virus.   If you develop any shortness of breath, wheezing or difficulty breathing go immediately to the nearest emergency department.        ED Prescriptions     Medication Sig Dispense Auth. Provider   oseltamivir (TAMIFLU) 75 MG capsule Take 1 capsule (75 mg total) by mouth 2 (two) times daily. 10 capsule Bing Neighbors, NP   predniSONE (DELTASONE) 20 MG tablet Take 1 tablet (20 mg total) by mouth daily with breakfast for 5 days. 5 tablet Bing Neighbors, NP   benzonatate (TESSALON PERLES) 100 MG capsule Take 2 capsules (200 mg total) by mouth 3 (three) times daily as needed for cough. 20 capsule Bing Neighbors, NP      PDMP not reviewed this encounter.   Bing Neighbors, NP 10/17/23 [phone]

## 2023-10-21 ENCOUNTER — Ambulatory Visit (INDEPENDENT_AMBULATORY_CARE_PROVIDER_SITE_OTHER): Payer: Medicare Other

## 2023-10-21 ENCOUNTER — Ambulatory Visit
Admission: RE | Admit: 2023-10-21 | Discharge: 2023-10-21 | Disposition: A | Discharge status: Home / Self Care | Payer: Medicare Other | Source: Ambulatory Visit | Attending: Family Medicine | Admitting: Family Medicine

## 2023-10-21 ENCOUNTER — Other Ambulatory Visit: Payer: Self-pay

## 2023-10-21 ENCOUNTER — Ambulatory Visit
Admission: EM | Admit: 2023-10-21 | Discharge: 2023-10-21 | Disposition: A | Discharge status: Home / Self Care | Payer: Medicare Other | Attending: Physician Assistant | Admitting: Physician Assistant

## 2023-10-21 VITALS — BP 132/90 | HR 78 | Temp 97.4°F

## 2023-10-21 DIAGNOSIS — R051 Acute cough: Secondary | ICD-10-CM

## 2023-10-21 DIAGNOSIS — J329 Chronic sinusitis, unspecified: Secondary | ICD-10-CM

## 2023-10-21 DIAGNOSIS — J40 Bronchitis, not specified as acute or chronic: Secondary | ICD-10-CM | POA: Diagnosis not present

## 2023-10-21 DIAGNOSIS — R059 Cough, unspecified: Secondary | ICD-10-CM | POA: Diagnosis not present

## 2023-10-21 MED ORDER — BENZONATATE 100 MG PO CAPS: 200.0000 mg | ORAL_CAPSULE | Freq: Three times a day (TID) | ORAL | 0 refills | Status: DC | PRN

## 2023-10-21 MED ORDER — AMOXICILLIN-POT CLAVULANATE 500-125 MG PO TABS: 1.0000 | ORAL_TABLET | Freq: Two times a day (BID) | ORAL | 0 refills | Status: DC

## 2023-10-21 NOTE — Discharge Instructions (Signed)
I did not see any evidence of pneumonia on your x-ray.  I will contact you if the radiologist sees something you will need to start a second antibiotic.  Start Augmentin twice daily for 10 days.  Use Mucinex over-the-counter.  I also recommend he rest and drink plenty of fluid.  Use Tessalon for cough.  Follow-up with your primary care first thing next week to ensure that your symptoms are improving.  If anything worsens and you have worsening cough, chest pain, shortness of breath, fever you need to go to the emergency room immediately.

## 2023-10-21 NOTE — ED Provider Notes (Signed)
[name]    CSN: [id number] Arrival date & time: 10/21/23  1632      History   Chief Complaint Chief Complaint  Patient presents with   Cough   Follow-up    HPI Jose [name] is a 83 y.o. male.   Patient presents today with a weeklong history of congestion and cough.  He was seen by our clinic on 10/17/2023 at which point he tested negative for flu and COVID.  He was empirically treated for influenza with Tamiflu and prednisone and completed course of medication but continues to have significant cough.  He denies any chest pain, shortness of breath, fever, nausea, vomiting.  He has been taking Coricidin without improvement of symptoms.  He has a COVID in the past but not for the past 90 days.  He denies any history of allergies, asthma, COPD, smoking.  He denies any recent antibiotics in the past 90 days.  He is concerned because the cough continues to be productive and bothersome.    Past Medical History:  Diagnosis Date   Acute lower GI bleeding 01/25/2013; 09/2014   diverticular bleed   Acute prostatitis 02/21/2019   ANXIETY 08/22/2008   Arthritis    "a little bit" (04/14/2015)   Barrett's esophagus 2003   hematochezia as a result since 2003   Basal cell cancer    removed from right forearm.    Bradycardia 04/14/2015   CORONARY ARTERY DISEASE    2 stents   Depression    DIVERTICULOSIS, COLON 2003   ERECTILE DYSFUNCTION 08/22/2008   GERD (gastroesophageal reflux disease)    Heart murmur 04/14/2015   "they think I did; that's why I'm here" (04/14/2015)   History of kidney stones    HYPERLIPIDEMIA 02/09/2007   Kidney stones    "blasted or passed" (04/14/2015)   MI (myocardial infarction) (HCC) 2000   Numbness    right side - history of    OBESITY 03/20/2010   Squamous cell cancer of skin of earlobe    removed from area behind right earlobe   Stroke (HCC)    4 yrs ago possible mini stroke -  tests never found anything    Patient Active Problem List    Diagnosis Date Noted   Aortic valve sclerosis 05/10/2023   Murmur 09/14/2022   DOE (dyspnea on exertion) 01/15/2021   Vitamin B12 deficiency 12/17/2020   History of COVID-19.  Hospitalization November 2020 with acute respiratory failure. 07/09/2019   Aortic aneurysm (HCC) 06/04/2019   Fatty liver 06/04/2019   Aortic atherosclerosis (HCC) 06/04/2019   Acute prostatitis 02/21/2019   Dyslipidemia 12/28/2018   H/O heart artery stent 12/28/2018   GERD (gastroesophageal reflux disease) 10/23/2018   Nasal congestion 10/23/2018   Primary osteoarthritis of left knee 02/27/2018   Orbital fracture, closed, initial encounter (HCC) 12/15/2017   Decreased range of motion of finger of right hand 12/13/2017   Decreased range of motion of left wrist 12/13/2017   Pain and swelling of left wrist 12/13/2017   Pain in finger of right hand 12/13/2017   Elevated PSA 08/03/2016   Benign prostatic hyperplasia with nocturia 05/27/2016   Lumbosacral radiculopathy 06/17/2015   Numbness 06/17/2015   Right leg numbness    Sinus bradycardia 03/19/2015   Diverticulosis of colon with hemorrhage    Gastrointestinal hemorrhage 10/15/2014   Actinic keratosis 08/07/2014   Elevated blood pressure 03/29/2014   Essential hypertension 03/29/2014   Hyperglycemia 02/26/2013   OBESITY 03/20/2010   Generalized anxiety disorder  08/22/2008   ERECTILE DYSFUNCTION 08/22/2008   Hyperlipidemia 02/09/2007   Atherosclerosis of coronary artery 02/09/2007   Barrett's esophagus 02/09/2007   Diverticulosis of large intestine 02/09/2007    Past Surgical History:  Procedure Laterality Date   BASAL CELL CARCINOMA EXCISION     posterior right ear.    CATARACT EXTRACTION, BILATERAL     COLONOSCOPY N/A 01/26/2013   Procedure: COLONOSCOPY;  Surgeon: Rachael Fee, MD;  Location: [facility] ENDOSCOPY;  Service: Endoscopy;  Laterality: N/A;   CORONARY ANGIOPLASTY WITH STENT PLACEMENT  09/06/1998   LAD stent per Dr Chales Abrahams in [place]. 2  stents.    CYSTOSCOPY W/ URETERAL STENT PLACEMENT Left 12/21/2016   Procedure: CYSTOSCOPY WITH RETROGRADE PYELOGRAM/URETERAL STENT PLACEMENT;  Surgeon: Sebastian Ache, MD;  Location: [facility] ORS;  Service: Urology;  Laterality: Left;   ESOPHAGOGASTRODUODENOSCOPY (EGD) WITH ESOPHAGEAL DILATION     "q time I have an EGD; usually q 3-4 years" (04/14/2015)   EXTRACORPOREAL SHOCK WAVE LITHOTRIPSY Left 12/30/2016   Procedure: LEFT EXTRACORPOREAL SHOCK WAVE LITHOTRIPSY (ESWL);  Surgeon: Jethro Bolus, MD;  Location: [facility] ORS;  Service: Urology;  Laterality: Left;   LITHOTRIPSY  X 2   SQUAMOUS CELL CARCINOMA EXCISION     right forearm   TONSILLECTOMY  1971   TOTAL KNEE ARTHROPLASTY Left 03/21/2018   TOTAL KNEE ARTHROPLASTY Left 03/21/2018   Procedure: LEFT TOTAL KNEE ARTHROPLASTY;  Surgeon: Sheral Apley, MD;  Location: [facility] OR;  Service: Orthopedics;  Laterality: Left;   UPPER GASTROINTESTINAL ENDOSCOPY     URETERAL STENT PLACEMENT     2019       Home Medications    Prior to Admission medications   Medication Sig Start Date End Date Taking? Authorizing Provider  amoxicillin-clavulanate (AUGMENTIN) 500-125 MG tablet Take 1 tablet by mouth in the morning and at bedtime. 10/21/23  Yes Yamna Mackel, Noberto Retort, PA-C  aspirin EC 81 MG tablet Take 81 mg by mouth daily. Swallow whole.   Yes [provider]  omeprazole (PRILOSEC) 40 MG capsule Take 1 capsule (40 mg total) by mouth daily. 12/23/22  Yes Shelva Majestic, MD  rosuvastatin (CRESTOR) 20 MG tablet Take 1 tablet (20 mg total) by mouth daily. 12/23/22  Yes Shelva Majestic, MD  tadalafil (CIALIS) 20 MG tablet TAKE ONE-HALF (1/2) TABLET DAILY AS NEEDED FOR ERECTILE DYSFUNCTION 12/23/22  Yes Shelva Majestic, MD  benzonatate (TESSALON PERLES) 100 MG capsule Take 2 capsules (200 mg total) by mouth 3 (three) times daily as needed for cough. 10/21/23   Kenndra Morris K, PA-C  ondansetron (ZOFRAN) 4 MG tablet Take 1 tablet (4 mg total) by mouth every 6  (six) hours as needed for nausea or vomiting. Patient not taking: Reported on 07/07/2023 12/23/22   Shelva Majestic, MD    Family History Family History  Problem Relation Age of Onset   Alcohol abuse Father    Liver disease Father    Heart disease Brother        CABG   Colon cancer Neg Hx     Social History Social History   Tobacco Use   Smoking status: Former    Current packs/day: 0.00    Average packs/day: 1 pack/day for 30.0 years (30.0 ttl pk-yrs)    Types: Cigarettes    Start date: 08/30/1958    Quit date: 08/30/1988    Years since quitting: 35.1   Smokeless tobacco: Never   Tobacco comments:    "quit smoking cigarettes in ~ 1990"  Vaping Use   Vaping status: Never Used  Substance Use Topics   Alcohol use: Yes    Comment: soccially   Drug use: No     Allergies   Doxycycline and Lipitor [atorvastatin]   Review of Systems Review of Systems  Constitutional:  Positive for activity change. Negative for appetite change, fatigue and fever.  HENT:  Positive for congestion, postnasal drip and sinus pressure. Negative for sneezing and sore throat.   Respiratory:  Positive for cough. Negative for shortness of breath.   Cardiovascular:  Negative for chest pain.  Gastrointestinal:  Negative for abdominal pain, diarrhea, nausea and vomiting.  Musculoskeletal:  Negative for arthralgias and myalgias.  Neurological:  Negative for dizziness, light-headedness and headaches.     Physical Exam Triage Vital Signs ED Triage Vitals [10/21/23 1707]  Encounter Vitals Group     BP (!) 132/90     Systolic BP Percentile      Diastolic BP Percentile      Pulse Rate 78     Resp      Temp (!) 97.4 F (36.3 C)     Temp Source Oral     SpO2 94 %     Weight      Height      Head Circumference      Peak Flow      Pain Score      Pain Loc      Pain Education      Exclude from Growth Chart    No data found.  Updated Vital Signs BP (!) 132/90 (BP Location: Right Arm)   Pulse  78   Temp (!) 97.4 F (36.3 C) (Oral)   SpO2 94%   Visual Acuity Right Eye Distance:   Left Eye Distance:   Bilateral Distance:    Right Eye Near:   Left Eye Near:    Bilateral Near:     Physical Exam Vitals reviewed.  Constitutional:      General: He is awake.     Appearance: Normal appearance. He is well-developed. He is not ill-appearing.     Comments: Very pleasant male appears stated age in no acute distress sitting comfortably in exam room  HENT:     Head: Normocephalic and atraumatic.     Right Ear: Tympanic membrane, ear canal and external ear normal. Tympanic membrane is not erythematous or bulging.     Left Ear: Tympanic membrane, ear canal and external ear normal. Tympanic membrane is not erythematous or bulging.     Nose: Nose normal.     Mouth/Throat:     Pharynx: Uvula midline. Postnasal drip present. No oropharyngeal exudate, posterior oropharyngeal erythema or uvula swelling.  Cardiovascular:     Rate and Rhythm: Normal rate and regular rhythm.     Heart sounds: S1 normal and S2 normal. Murmur heard.  Pulmonary:     Effort: Pulmonary effort is normal. No accessory muscle usage or respiratory distress.     Breath sounds: No stridor. Examination of the right-lower field reveals decreased breath sounds. Examination of the left-lower field reveals decreased breath sounds. Decreased breath sounds present. No wheezing, rhonchi or rales.  Abdominal:     General: Bowel sounds are normal.     Palpations: Abdomen is soft.     Tenderness: There is no abdominal tenderness.  Neurological:     Mental Status: He is alert.  Psychiatric:        Behavior: Behavior is cooperative.      UC  Treatments / Results  Labs (all labs ordered are listed, but only abnormal results are displayed) Labs Reviewed - No data to display  EKG   Radiology No results found.  Procedures Procedures (including critical care time)  Medications Ordered in UC Medications - No data to  display  Initial Impression / Assessment and Plan / UC Course  I have reviewed the triage vital signs and the nursing notes.  Pertinent labs & imaging results that were available during my care of the patient were reviewed by me and considered in my medical decision making (see chart for details).     Patient is well-appearing, afebrile, nontoxic, nontachycardic.  Viral testing was deferred as he has been symptomatic for over a week and this would not change management.  Chest x-ray was obtained that showed no acute cardiopulmonary disease based on my primary read.  At the time of discharge we were waiting for radiologist over read and we will contact him if this differs and changes our treatment plan.  We will start Augmentin to cover for secondary bacterial infection such as sinobronchitis given his prolonged and worsening symptoms.  No indication for dose adjustment based on metabolic panel from 07/07/2023 with creatinine of 1.26 and calculated creatinine clearance of 59.1 mm/min.  He was encouraged to use over-the-counter medication including Mucinex, Flonase, Tylenol.  He was given Tessalon for cough.  Recommend close follow-up with his primary care ideally first thing next week.  We discussed that if his symptoms are not improving within a few days of being on antibiotics he needs to be reevaluated or if anything worsens and he has high fever, chest pain, shortness of breath, worsening cough, nausea/vomiting, weakness he needs to be seen emergently.  Strict return precautions given.  Patient expressed understanding and agreement to treatment plan.  Final Clinical Impressions(s) / UC Diagnoses   Final diagnoses:  Acute cough  Sinobronchitis     Discharge Instructions      I did not see any evidence of pneumonia on your x-ray.  I will contact you if the radiologist sees something you will need to start a second antibiotic.  Start Augmentin twice daily for 10 days.  Use Mucinex  over-the-counter.  I also recommend he rest and drink plenty of fluid.  Use Tessalon for cough.  Follow-up with your primary care first thing next week to ensure that your symptoms are improving.  If anything worsens and you have worsening cough, chest pain, shortness of breath, fever you need to go to the emergency room immediately.     ED Prescriptions     Medication Sig Dispense Auth. Provider   benzonatate (TESSALON PERLES) 100 MG capsule Take 2 capsules (200 mg total) by mouth 3 (three) times daily as needed for cough. 20 capsule Idalee Foxworthy K, PA-C   amoxicillin-clavulanate (AUGMENTIN) 500-125 MG tablet Take 1 tablet by mouth in the morning and at bedtime. 20 tablet Ruthy Forry, Noberto Retort, PA-C      PDMP not reviewed this encounter.   Jeani Hawking, PA-C 10/21/23 1801

## 2023-10-21 NOTE — ED Triage Notes (Signed)
Pt seen here Monday for cough. He was treated with tamiflu and steroids. He had negative test for flu/covid this week. States he is concerned for covid because he has a friend who tested positive this week. C/o productive cough. Asking if there are any other meds he can take.

## 2023-11-23 DIAGNOSIS — M1711 Unilateral primary osteoarthritis, right knee: Secondary | ICD-10-CM | POA: Diagnosis not present

## 2023-11-23 DIAGNOSIS — M25512 Pain in left shoulder: Secondary | ICD-10-CM | POA: Diagnosis not present

## 2023-11-29 ENCOUNTER — Other Ambulatory Visit: Payer: Self-pay | Admitting: Family

## 2023-12-01 NOTE — Telephone Encounter (Signed)
 Patient is calling back in regarding his tramdol medication

## 2023-12-08 DIAGNOSIS — E785 Hyperlipidemia, unspecified: Secondary | ICD-10-CM | POA: Diagnosis not present

## 2023-12-08 DIAGNOSIS — I10 Essential (primary) hypertension: Secondary | ICD-10-CM | POA: Diagnosis not present

## 2023-12-08 DIAGNOSIS — I2510 Atherosclerotic heart disease of native coronary artery without angina pectoris: Secondary | ICD-10-CM | POA: Diagnosis not present

## 2023-12-08 DIAGNOSIS — I358 Other nonrheumatic aortic valve disorders: Secondary | ICD-10-CM | POA: Diagnosis not present

## 2023-12-08 DIAGNOSIS — Z955 Presence of coronary angioplasty implant and graft: Secondary | ICD-10-CM | POA: Diagnosis not present

## 2024-01-06 ENCOUNTER — Ambulatory Visit (INDEPENDENT_AMBULATORY_CARE_PROVIDER_SITE_OTHER): Payer: Medicare Other | Admitting: Family Medicine

## 2024-01-06 ENCOUNTER — Encounter: Payer: Self-pay | Admitting: Family Medicine

## 2024-01-06 VITALS — BP 112/70 | HR 74 | Temp 98.8°F | Resp 18 | Ht 69.0 in | Wt 198.0 lb

## 2024-01-06 DIAGNOSIS — R739 Hyperglycemia, unspecified: Secondary | ICD-10-CM | POA: Diagnosis not present

## 2024-01-06 DIAGNOSIS — I10 Essential (primary) hypertension: Secondary | ICD-10-CM

## 2024-01-06 DIAGNOSIS — E785 Hyperlipidemia, unspecified: Secondary | ICD-10-CM | POA: Diagnosis not present

## 2024-01-06 DIAGNOSIS — Z131 Encounter for screening for diabetes mellitus: Secondary | ICD-10-CM

## 2024-01-06 MED ORDER — OMEPRAZOLE 40 MG PO CPDR: 40.0000 mg | DELAYED_RELEASE_CAPSULE | Freq: Every day | ORAL | 3 refills | Status: AC

## 2024-01-06 NOTE — Progress Notes (Signed)
 Phone [phone] In person visit   Subjective:   Jose [name] is a 83 y.o. year old very pleasant male patient who presents for/with See problem oriented charting Chief Complaint  Patient presents with   Follow-up    Past Medical History-  Patient Active Problem List   Diagnosis Date Noted   History of COVID-19.  Hospitalization November 2020 with acute respiratory failure. 07/09/2019    Priority: High   Aortic aneurysm (HCC) 06/04/2019    Priority: High   Atherosclerosis of coronary artery 02/09/2007    Priority: High   Barrett's esophagus 02/09/2007    Priority: High   Vitamin B12 deficiency 12/17/2020    Priority: Medium    Fatty liver 06/04/2019    Priority: Medium    GERD (gastroesophageal reflux disease) 10/23/2018    Priority: Medium    Primary osteoarthritis of left knee 02/27/2018    Priority: Medium    Elevated PSA 08/03/2016    Priority: Medium    Benign prostatic hyperplasia with nocturia 05/27/2016    Priority: Medium    Essential hypertension 03/29/2014    Priority: Medium    Hyperglycemia 02/26/2013    Priority: Medium    Hyperlipidemia 02/09/2007    Priority: Medium    Aortic atherosclerosis (HCC) 06/04/2019    Priority: Low   Acute prostatitis 02/21/2019    Priority: Low   Nasal congestion 10/23/2018    Priority: Low   Lumbosacral radiculopathy 06/17/2015    Priority: Low   Numbness 06/17/2015    Priority: Low   Right leg numbness     Priority: Low   Sinus bradycardia 03/19/2015    Priority: Low   Diverticulosis of colon with hemorrhage     Priority: Low   Actinic keratosis 08/07/2014    Priority: Low   Elevated blood pressure 03/29/2014    Priority: Low   OBESITY 03/20/2010    Priority: Low   Generalized anxiety disorder 08/22/2008    Priority: Low   ERECTILE DYSFUNCTION 08/22/2008    Priority: Low   Diverticulosis of large intestine 02/09/2007    Priority: Low   Aortic valve sclerosis 05/10/2023   Murmur 09/14/2022    DOE (dyspnea on exertion) 01/15/2021   Dyslipidemia 12/28/2018   H/O heart artery stent 12/28/2018   Decreased range of motion of finger of right hand 12/13/2017   Decreased range of motion of left wrist 12/13/2017   Pain and swelling of left wrist 12/13/2017   Pain in finger of right hand 12/13/2017   Gastrointestinal hemorrhage 10/15/2014    Medications- reviewed and updated Current Outpatient Medications  Medication Sig Dispense Refill   aspirin  EC 81 MG tablet Take 81 mg by mouth daily. Swallow whole.     omeprazole  (PRILOSEC) 40 MG capsule Take 1 capsule (40 mg total) by mouth daily. 90 capsule 3   rosuvastatin  (CRESTOR ) 20 MG tablet Take 1 tablet (20 mg total) by mouth daily. 90 tablet 3   tadalafil  (CIALIS ) 20 MG tablet TAKE ONE-HALF (1/2) TABLET DAILY AS NEEDED FOR ERECTILE DYSFUNCTION 90 tablet 3   traMADol  (ULTRAM ) 50 MG tablet Take 1-2 tablets (50-100 mg total) by mouth 2 (two) times daily as needed for severe pain (pain score 7-10) (do not drive 8 hours after taking). 60 tablet 5   No current facility-administered medications for this visit.     Objective:  BP 112/70 (BP Location: Left Arm, Patient Position: Sitting, Cuff Size: Normal)   Pulse 74   Temp 98.8 F (37.1 C) (Temporal)  Resp 18   Ht 5\' 9"  (1.753 m)   Wt 198 lb (89.8 kg)   SpO2 95%   BMI 29.24 kg/m  Gen: NAD, resting comfortably CV: RRR no murmurs rubs or gallops Lungs: CTAB no crackles, wheeze, rhonchi Ext: trace edema Skin: warm, dry     Assessment and Plan    #Health maintenance - discussed Prevnar 20- opts out  - reports had shingrix in the past- we don't have records Immunization History  Administered Date(s) Administered   Fluad Quad(high Dose 65+) 06/04/2019, 06/22/2022   Influenza Split 08/23/2011   Influenza, High Dose Seasonal PF 08/03/2016, 09/05/2017, 06/20/2018, 08/29/2020   Influenza,inj,Quad PF,6+ Mos 08/07/2014   PFIZER(Purple Top)SARS-COV-2 Vaccination 10/21/2019, 11/13/2019    Pneumococcal Polysaccharide-23 08/22/2008   Td 08/31/2003   Td (Adult), 2 Lf Tetanus Toxid, Preservative Free 08/31/2003   #CAD-followed in [facility] with history of stent- remains on aspirin  81 mg #Hyperlipidemia  Ideal LDL under 70 s: Medication: Rosuvastatin  20 mg, aspirin  81 mg -walks some and does stairs on a regular basis in his home -no chest pain or shortness of breath  Lab Results  Component Value Date   CHOL 107 12/23/2022   HDL 40.90 12/23/2022   LDLCALC 55 12/23/2022   LDLDIRECT 100.0 12/19/2017   TRIG 56.0 12/23/2022   CHOLHDL 3 12/23/2022   A/P:coronary artery disease asymptomatic continue current medications Lipids at goal- continue current medications as long as stable this year -checking today    #Left knee pain S: History of left knee replacement-with continued pain in the left knee despite this patient using 1 tramadol  in the morning and 1 in the evening for his knees.  We received a letter from [facility] in 2020 requesting that we begin his long-term tramadol  prescription. -did have recent x-rays of both knees- overall stable- but still bothering him A/P:ongoing pain in left knee even with surgery- can refill tramadol  as needed until 6 month visit but recently had refill  #prediabetes/Hyperglycemia-peak A1c 5.8 s: Diet controlled in the past. Steroid shot 2 months ago for shoulder Lab Results  Component Value Date   HGBA1C 5.8 07/07/2023   HGBA1C 5.7 12/23/2022   HGBA1C 5.7 06/22/2022  A/P: hopefully stable- update a1c today. Continue without meds for now   #GERD/Barrett's esophagus S: doing well on omeprazole  40mg  per Dr. Sandrea Cruel previously- I know prescribe after his retirement. Only issue he gets is some phlegm clearing- discussed could also be allergy related.  A/P: reflux stable- continue current medications  - should be due for q3 year EGD later this year in july  # Fatty liver S:  Noted on CT 04/20/2019.  A/P:  check LFTs today- mild weight  loss encouraged  # Aortic aneurysm S: 3.3 cm aneurysm noted on ct 04/20/2019 -updated 07/07/22 at 3.7 cm up slightly A/P: CT 03/28/23 with 2 year repeat ultrasound recommended - will plan on that in 2026   #Elevated PSA-following up with urology yearly at least. Was around 5.5 in august and apparently came down November 2020 around 4 - planned yearly follow up Lab Results  Component Value Date   PSA 2.93 03/16/2018   PSA 4.25 (H) 12/19/2017   PSA 3.07 07/28/2016   Recommended follow up: Return in about 6 months (around 07/08/2024) for followup or sooner if needed.Schedule b4 you leave. Future Appointments  Date Time Provider Department Center  02/20/2024  3:00 PM [facility] ANNUAL WELLNESS VISIT 1 [facility]    Lab/Order associations: bowl of cereal this morning, small  hamburger    ICD-10-CM   1. Essential hypertension  I10 Comprehensive metabolic panel with GFR    CBC with Differential/Platelet    Lipid panel    2. Hyperlipidemia, unspecified hyperlipidemia type  E78.5 Comprehensive metabolic panel with GFR    CBC with Differential/Platelet    Lipid panel    3. Hyperglycemia  R73.9 Hemoglobin A1c    4. Screening for diabetes mellitus  Z13.1 Hemoglobin A1c      Meds ordered this encounter  Medications   omeprazole  (PRILOSEC) 40 MG capsule    Sig: Take 1 capsule (40 mg total) by mouth daily.    Dispense:  90 capsule    Refill:  3    Return precautions advised.  Clarisa Crooked, MD

## 2024-01-06 NOTE — Patient Instructions (Addendum)
 Prevnar 20- consider at pharmacy  Please check with your pharmacy to see if they have the shingrix vaccine. If they do- please get this immunization and update us  by phone call or mychart with dates you receive the vaccine  Please stop by lab before you go If you have mychart- we will send your results within 3 business days of us  receiving them.  If you do not have mychart- we will call you about results within 5 business days of us  receiving them.  *please also note that you will see labs on mychart as soon as they post. I will later go in and write notes on them- will say "notes from Dr. Arlene Ben"   Recommended follow up: Return in about 6 months (around 07/08/2024) for followup or sooner if needed.Schedule b4 you leave.

## 2024-01-07 LAB — LIPID PANEL
Cholesterol: 105 mg/dL (ref <200)
HDL: 40 mg/dL (ref >=40)
LDL Cholesterol (Calc): 48 mg/dL
Non-HDL Cholesterol (Calc): 65 mg/dL (ref <130)
Total CHOL/HDL Ratio: 2.6 (calc) (ref <5.0)
Triglycerides: 91 mg/dL (ref <150)

## 2024-01-07 LAB — CBC WITH DIFFERENTIAL/PLATELET
Absolute Lymphocytes: 1342 {cells}/uL (ref 850–3900)
Absolute Monocytes: 1000 {cells}/uL — ABNORMAL HIGH (ref 200–950)
Basophils Absolute: 43 {cells}/uL (ref 0–200)
Basophils Relative: 0.7 %
Eosinophils Absolute: 67 {cells}/uL (ref 15–500)
Eosinophils Relative: 1.1 %
HCT: 42.8 % (ref 38.5–50.0)
Hemoglobin: 14.6 g/dL (ref 13.2–17.1)
MCH: 28.7 pg (ref 27.0–33.0)
MCHC: 34.1 g/dL (ref 32.0–36.0)
MCV: 84.3 fL (ref 80.0–100.0)
MPV: 10.4 fL (ref 7.5–12.5)
Monocytes Relative: 16.4 %
Neutro Abs: 3648 {cells}/uL (ref 1500–7800)
Neutrophils Relative %: 59.8 %
Platelets: 173 10*3/uL (ref 140–400)
RBC: 5.08 10*6/uL (ref 4.20–5.80)
RDW: 13.4 % (ref 11.0–15.0)
Total Lymphocyte: 22 %
WBC: 6.1 10*3/uL (ref 3.8–10.8)

## 2024-01-07 LAB — COMPREHENSIVE METABOLIC PANEL WITH GFR
AG Ratio: 1.8 (calc) (ref 1.0–2.5)
ALT: 13 U/L (ref 9–46)
AST: 17 U/L (ref 10–35)
Albumin: 3.9 g/dL (ref 3.6–5.1)
Alkaline phosphatase (APISO): 74 U/L (ref 35–144)
BUN: 14 mg/dL (ref 7–25)
CO2: 24 mmol/L (ref 20–32)
Calcium: 9 mg/dL (ref 8.6–10.3)
Chloride: 107 mmol/L (ref 98–110)
Creat: 1.22 mg/dL (ref 0.70–1.22)
Globulin: 2.2 g/dL (ref 1.9–3.7)
Glucose, Bld: 106 mg/dL — ABNORMAL HIGH (ref 65–99)
Potassium: 3.9 mmol/L (ref 3.5–5.3)
Sodium: 138 mmol/L (ref 135–146)
Total Bilirubin: 0.8 mg/dL (ref 0.2–1.2)
Total Protein: 6.1 g/dL (ref 6.1–8.1)
eGFR: 60 mL/min/{1.73_m2} (ref >=60)

## 2024-01-09 ENCOUNTER — Encounter: Payer: Self-pay | Admitting: Family Medicine

## 2024-02-20 ENCOUNTER — Ambulatory Visit: Payer: PRIVATE HEALTH INSURANCE

## 2024-02-20 VITALS — Ht 70.0 in | Wt 198.0 lb

## 2024-02-20 DIAGNOSIS — Z0000 Encounter for general adult medical examination without abnormal findings: Secondary | ICD-10-CM

## 2024-02-20 NOTE — Patient Instructions (Signed)
 [name] , Thank you for taking time out of your busy schedule to complete your Annual Wellness Visit with me. I enjoyed our conversation and look forward to speaking with you again next year. I, as well as your care team,  appreciate your ongoing commitment to your health goals. Please review the following plan we discussed and let me know if I can assist you in the future. Your Game plan/ To Do List    Referrals: If you haven't heard from the office you've been referred to, please reach out to them at the phone provided.   Follow up Visits: Next Medicare AWV with our clinical staff: 02/25/25   Have you seen your provider in the last 6 months (3 months if uncontrolled diabetes)? Yes Next Office Visit with your provider: 07/09/24  Clinician Recommendations:  Aim for 30 minutes of exercise or brisk walking, 6-8 glasses of water, and 5 servings of fruits and vegetables each day.       This is a list of the screening recommended for you and due dates:  Health Maintenance  Topic Date Due   Zoster (Shingles) Vaccine (1 of 2) Never done   Pneumococcal Vaccine for age over 23 (2 of 2 - PCV) 08/22/2009   Flu Shot  03/30/2024   Medicare Annual Wellness Visit  02/19/2025   HPV Vaccine  Aged Out   Meningitis B Vaccine  Aged Out   Screening for Lung Cancer  Discontinued   DTaP/Tdap/Td vaccine  Discontinued   Colon Cancer Screening  Discontinued   COVID-19 Vaccine  Discontinued   Hepatitis C Screening  Discontinued    Advanced directives: (Declined) Advance directive discussed with you today. Even though you declined this today, please call our office should you change your mind, and we can give you the proper paperwork for you to fill out. Advance Care Planning is important because it:  [x]  Makes sure you receive the medical care that is consistent with your values, goals, and preferences  [x]  It provides guidance to your family and loved ones and reduces their decisional burden about  whether or not they are making the right decisions based on your wishes.  Follow the link provided in your after visit summary or read over the paperwork we have mailed to you to help you started getting your Advance Directives in place. If you need assistance in completing these, please reach out to us  so that we can help you!  See attachments for Preventive Care and Fall Prevention Tips.

## 2024-02-20 NOTE — Progress Notes (Signed)
 Subjective:   Jose [name] is a 83 y.o. who presents for a Medicare Wellness preventive visit.  As a reminder, Annual Wellness Visits don't include a physical exam, and some assessments may be limited, especially if this visit is performed virtually. We may recommend an in-person follow-up visit with your provider if needed.  Visit Complete: Virtual I connected with  Jose [name] on 02/20/24 by a audio enabled telemedicine application and verified that I am speaking with the correct person using two identifiers.  Patient Location: Home  Provider Location: Office/Clinic  I discussed the limitations of evaluation and management by telemedicine. The patient expressed understanding and agreed to proceed.  Vital Signs: Because this visit was a virtual/telehealth visit, some criteria may be missing or patient reported. Any vitals not documented were not able to be obtained and vitals that have been documented are patient reported.  VideoDeclined- This patient declined Librarian, academic. Therefore the visit was completed with audio only.  Persons Participating in Visit: Patient.  AWV Questionnaire: No: Patient Medicare AWV questionnaire was not completed prior to this visit.  Cardiac Risk Factors include: advanced age (>28men, >61 women);dyslipidemia;male gender;hypertension     Objective:    Today's Vitals   02/20/24 1459  Weight: 198 lb (89.8 kg)  Height: 5' 10 (1.778 m)   Body mass index is 28.41 kg/m.     02/20/2024    3:01 PM 03/28/2023    3:45 PM 02/14/2023    3:44 PM 08/27/2022   11:46 AM 02/12/2022    8:05 AM 07/16/2021    3:26 PM 02/06/2021    8:10 AM  Advanced Directives  Does Patient Have a Medical Advance Directive? No No No No No No No  Would patient like information on creating a medical advance directive? No - Patient declined  No - Patient declined No - Patient declined No - Patient declined No - Patient declined No -  Patient declined    Current Medications (verified) Outpatient Encounter Medications as of 02/20/2024  Medication Sig   aspirin  EC 81 MG tablet Take 81 mg by mouth daily. Swallow whole.   omeprazole  (PRILOSEC) 40 MG capsule Take 1 capsule (40 mg total) by mouth daily.   rosuvastatin  (CRESTOR ) 20 MG tablet Take 1 tablet (20 mg total) by mouth daily.   tadalafil  (CIALIS ) 20 MG tablet TAKE ONE-HALF (1/2) TABLET DAILY AS NEEDED FOR ERECTILE DYSFUNCTION   traMADol  (ULTRAM ) 50 MG tablet Take 1-2 tablets (50-100 mg total) by mouth 2 (two) times daily as needed for severe pain (pain score 7-10) (do not drive 8 hours after taking).   No facility-administered encounter medications on file as of 02/20/2024.    Allergies (verified) Doxycycline  and Lipitor [atorvastatin]   History: Past Medical History:  Diagnosis Date   Acute lower GI bleeding 01/25/2013; 09/2014   diverticular bleed   Acute prostatitis 02/21/2019   ANXIETY 08/22/2008   Arthritis    a little bit (04/14/2015)   Barrett's esophagus 2003   hematochezia as a result since 2003   Basal cell cancer    removed from right forearm.    Bradycardia 04/14/2015   CORONARY ARTERY DISEASE    2 stents   Depression    DIVERTICULOSIS, COLON 2003   ERECTILE DYSFUNCTION 08/22/2008   GERD (gastroesophageal reflux disease)    Heart murmur 04/14/2015   they think I did; that's why I'm here (04/14/2015)   History of kidney stones    HYPERLIPIDEMIA 02/09/2007   Kidney  stones    blasted or passed (04/14/2015)   MI (myocardial infarction) (HCC) 2000   Numbness    right side - history of    OBESITY 03/20/2010   Squamous cell cancer of skin of earlobe    removed from area behind right earlobe   Stroke (HCC)    4 yrs ago possible mini stroke -  tests never found anything   Past Surgical History:  Procedure Laterality Date   BASAL CELL CARCINOMA EXCISION     posterior right ear.    CATARACT EXTRACTION, BILATERAL     COLONOSCOPY N/A 01/26/2013    Procedure: COLONOSCOPY;  Surgeon: Toribio SHAUNNA Cedar, MD;  Location: [facility] ENDOSCOPY;  Service: Endoscopy;  Laterality: N/A;   CORONARY ANGIOPLASTY WITH STENT PLACEMENT  09/06/1998   LAD stent per Dr Lilian in [place]. 2 stents.    CYSTOSCOPY W/ URETERAL STENT PLACEMENT Left 12/21/2016   Procedure: CYSTOSCOPY WITH RETROGRADE PYELOGRAM/URETERAL STENT PLACEMENT;  Surgeon: Ricardo Likens, MD;  Location: [facility] ORS;  Service: Urology;  Laterality: Left;   ESOPHAGOGASTRODUODENOSCOPY (EGD) WITH ESOPHAGEAL DILATION     q time I have an EGD; usually q 3-4 years (04/14/2015)   EXTRACORPOREAL SHOCK WAVE LITHOTRIPSY Left 12/30/2016   Procedure: LEFT EXTRACORPOREAL SHOCK WAVE LITHOTRIPSY (ESWL);  Surgeon: Arlena Gal, MD;  Location: [facility];  Service: Urology;  Laterality: Left;   LITHOTRIPSY  X 2   SQUAMOUS CELL CARCINOMA EXCISION     right forearm   TONSILLECTOMY  1971   TOTAL KNEE ARTHROPLASTY Left 03/21/2018   TOTAL KNEE ARTHROPLASTY Left 03/21/2018   Procedure: LEFT TOTAL KNEE ARTHROPLASTY;  Surgeon: Beverley Evalene BIRCH, MD;  Location: [facility] OR;  Service: Orthopedics;  Laterality: Left;   UPPER GASTROINTESTINAL ENDOSCOPY     URETERAL STENT PLACEMENT     2019   Family History  Problem Relation Age of Onset   Alcohol abuse Father    Liver disease Father    Heart disease Brother        CABG   Colon cancer Neg Hx    Social History   Socioeconomic History   Marital status: Widowed    Spouse name: Not on file   Number of children: 4   Years of education: Not on file   Highest education level: Not on file  Occupational History   Occupation: retired  Tobacco Use   Smoking status: Former    Current packs/day: 0.00    Average packs/day: 1 pack/day for 30.0 years (30.0 ttl pk-yrs)    Types: Cigarettes    Start date: 08/30/1958    Quit date: 08/30/1988    Years since quitting: 35.4   Smokeless tobacco: Never   Tobacco comments:    quit smoking cigarettes in ~ 1990  Vaping Use   Vaping status:  Never Used  Substance and Sexual Activity   Alcohol use: Yes    Comment: soccially   Drug use: No   Sexual activity: Not on file  Other Topics Concern   Not on file  Social History Narrative   Still working at [employer]   Married 35 years in 2015, 4 kids from first marriage, 3 grandkids   Hobbies: woodworking, tv movies- old action movies like Rambo   Social Drivers of Health   Financial Resource Strain: Low Risk  (02/20/2024)   Overall Financial Resource Strain (CARDIA)    Difficulty of Paying Living Expenses: Not hard at all  Food Insecurity: No Food Insecurity (02/20/2024)   Hunger Vital Sign  Worried About Programme researcher, broadcasting/film/video in the Last Year: Never true    Ran Out of Food in the Last Year: Never true  Transportation Needs: No Transportation Needs (02/20/2024)   PRAPARE - Administrator, Civil Service (Medical): No    Lack of Transportation (Non-Medical): No  Physical Activity: Inactive (02/20/2024)   Exercise Vital Sign    Days of Exercise per Week: 0 days    Minutes of Exercise per Session: 0 min  Stress: No Stress Concern Present (02/20/2024)   [employer] of Occupational Health - Occupational Stress Questionnaire    Feeling of Stress: Not at all  Social Connections: Socially Isolated (02/20/2024)   Social Connection and Isolation Panel    Frequency of Communication with Friends and Family: More than three times a week    Frequency of Social Gatherings with Friends and Family: More than three times a week    Attends Religious Services: Never    Database administrator or Organizations: No    Attends Banker Meetings: Never    Marital Status: Widowed    Tobacco Counseling Counseling given: Not Answered Tobacco comments: quit smoking cigarettes in ~ 1990    Clinical Intake:  Pre-visit preparation completed: Yes  Pain : No/denies pain     BMI - recorded: 28.41 Nutritional Status: BMI 25 -29 Overweight Nutritional  Risks: None Diabetes: No  Lab Results  Component Value Date   HGBA1C 5.8 07/07/2023   HGBA1C 5.7 12/23/2022   HGBA1C 5.7 06/22/2022     How often do you need to have someone help you when you read instructions, pamphlets, or other written materials from your doctor or pharmacy?: 1 - Never  Interpreter Needed?: No  Information entered by :: Ellouise Haws, LPN   Activities of Daily Living     02/20/2024    3:00 PM  In your present state of health, do you have any difficulty performing the following activities:  Hearing? 1  Comment hearing aids  Vision? 0  Difficulty concentrating or making decisions? 0  Walking or climbing stairs? 0  Dressing or bathing? 0  Doing errands, shopping? 0  Preparing Food and eating ? N  Using the Toilet? N  In the past six months, have you accidently leaked urine? N  Do you have problems with loss of bowel control? N  Managing your Medications? N  Managing your Finances? N  Housekeeping or managing your Housekeeping? N    Patient Care Team: Katrinka Garnette KIDD, MD as PCP - General (Family Medicine) [facility], [facility] Aneita Gwendlyn DASEN, MD (Inactive) as Consulting Physician (Gastroenterology) Tye Leach, NP as Nurse Practitioner (Cardiology)  I have updated your Care Teams any recent Medical Services you may have received from other providers in the past year.     Assessment:   This is a routine wellness examination for Jose [name].  Hearing/Vision screen Hearing Screening - Comments::  Hearing aids  Vision Screening - Comments:: Pt stated he goes to [facility] [employer] at times was following up with Dr Octavia as well    Goals Addressed             This Visit's Progress    Patient Stated       Stay active as I can        Depression Screen     02/20/2024    3:02 PM 07/07/2023    3:41 PM 02/14/2023    3:43 PM 02/12/2022    8:04 AM  06/11/2021    2:56 PM 02/06/2021    8:09 AM 12/10/2020    3:01 PM  PHQ 2/9 Scores   PHQ - 2 Score 0 0 0 0 0 0 0  PHQ- 9 Score  1     0    Fall Risk     02/20/2024    3:04 PM 07/07/2023    3:41 PM 02/14/2023    3:45 PM 02/12/2022    8:07 AM 06/11/2021    2:55 PM  Fall Risk   Falls in the past year? 0 1 0 0 0  Number falls in past yr: 0 0 0 0 0  Injury with Fall? 0 0 0 0 0  Risk for fall due to : No Fall Risks No Fall Risks Impaired vision Impaired vision No Fall Risks  Follow up Falls prevention discussed Falls evaluation completed Falls prevention discussed Falls prevention discussed  Falls evaluation completed      Data saved with a previous flowsheet row definition    MEDICARE RISK AT HOME:  Medicare Risk at Home Any stairs in or around the home?: Yes If so, are there any without handrails?: No Home free of loose throw rugs in walkways, pet beds, electrical cords, etc?: Yes Adequate lighting in your home to reduce risk of falls?: Yes Life alert?: No Use of a cane, walker or w/c?: No  TIMED UP AND GO:  Was the test performed?  No  Cognitive Function: 6CIT completed        02/14/2023    3:47 PM 02/12/2022    8:08 AM 02/06/2021    8:15 AM 12/03/2019    1:46 PM  6CIT Screen  What Year? 0 points 0 points 0 points 0 points  What month? 0 points 0 points 0 points 0 points  What time? 0 points 0 points 0 points 0 points  Count back from 20 0 points 0 points 0 points 0 points  Months in reverse 0 points 0 points 0 points 0 points  Repeat phrase 0 points 0 points 0 points 0 points  Total Score 0 points 0 points 0 points 0 points    Immunizations Immunization History  Administered Date(s) Administered   Fluad Quad(high Dose 65+) 06/04/2019, 06/22/2022   Influenza Split 08/23/2011   Influenza, High Dose Seasonal PF 08/03/2016, 09/05/2017, 06/20/2018, 08/29/2020, 10/06/2023   Influenza,inj,Quad PF,6+ Mos 08/07/2014   PFIZER(Purple Top)SARS-COV-2 Vaccination 10/21/2019, 11/13/2019   Pneumococcal Polysaccharide-23 08/22/2008   Td 08/31/2003   Td (Adult), 2  Lf Tetanus Toxid, Preservative Free 08/31/2003    Screening Tests Health Maintenance  Topic Date Due   Zoster Vaccines- Shingrix (1 of 2) Never done   Pneumococcal Vaccine: 50+ Years (2 of 2 - PCV) 08/22/2009   INFLUENZA VACCINE  03/30/2024   Medicare Annual Wellness (AWV)  02/19/2025   HPV VACCINES  Aged Out   Meningococcal B Vaccine  Aged Out   Lung Cancer Screening  Discontinued   DTaP/Tdap/Td  Discontinued   Colonoscopy  Discontinued   COVID-19 Vaccine  Discontinued   Hepatitis C Screening  Discontinued    Health Maintenance  Health Maintenance Due  Topic Date Due   Zoster Vaccines- Shingrix (1 of 2) Never done   Pneumococcal Vaccine: 50+ Years (2 of 2 - PCV) 08/22/2009   Health Maintenance Items Addressed: See Nurse Notes at the end of this note  Additional Screening:  Vision Screening: Recommended annual ophthalmology exams for early detection of glaucoma and other disorders of the eye. Would  you like a referral to an eye doctor? No    Dental Screening: Recommended annual dental exams for proper oral hygiene  Community Resource Referral / Chronic Care Management: CRR required this visit?  No   CCM required this visit?  No   Plan:    I have personally reviewed and noted the following in the patient's chart:   Medical and social history Use of alcohol, tobacco or illicit drugs  Current medications and supplements including opioid prescriptions. Patient is currently taking opioid prescriptions. Information provided to patient regarding non-opioid alternatives. Patient advised to discuss non-opioid treatment plan with their provider. Functional ability and status Nutritional status Physical activity Advanced directives List of other physicians Hospitalizations, surgeries, and ER visits in previous 12 months Vitals Screenings to include cognitive, depression, and falls Referrals and appointments  In addition, I have reviewed and discussed with patient  certain preventive protocols, quality metrics, and best practice recommendations. A written personalized care plan for preventive services as well as general preventive health recommendations were provided to patient.   Ellouise VEAR Haws, LPN   3/76/7974   After Visit Summary: (MyChart) Due to this being a telephonic visit, the after visit summary with patients personalized plan was offered to patient via MyChart   Notes: Nothing significant to report at this time.

## 2024-03-05 DIAGNOSIS — M25552 Pain in left hip: Secondary | ICD-10-CM | POA: Diagnosis not present

## 2024-03-05 DIAGNOSIS — M5416 Radiculopathy, lumbar region: Secondary | ICD-10-CM | POA: Diagnosis not present

## 2024-03-21 DIAGNOSIS — M5116 Intervertebral disc disorders with radiculopathy, lumbar region: Secondary | ICD-10-CM | POA: Diagnosis not present

## 2024-04-12 DIAGNOSIS — M5116 Intervertebral disc disorders with radiculopathy, lumbar region: Secondary | ICD-10-CM | POA: Diagnosis not present

## 2024-05-02 DIAGNOSIS — M5416 Radiculopathy, lumbar region: Secondary | ICD-10-CM | POA: Diagnosis not present

## 2024-05-07 ENCOUNTER — Other Ambulatory Visit: Payer: Self-pay | Admitting: Family Medicine

## 2024-05-07 MED ORDER — TRAMADOL HCL 50 MG PO TABS: 50.0000 mg | ORAL_TABLET | Freq: Two times a day (BID) | ORAL | 5 refills | Status: DC | PRN

## 2024-05-07 NOTE — Telephone Encounter (Signed)
 Copied from CRM [id number]. Topic: Clinical - Medication Refill >> May 07, 2024  9:16 AM Janeecia G wrote: Medication: tramadol   Has the patient contacted their pharmacy? Yes (Agent: If no, request that the patient contact the pharmacy for the refill. If patient does not wish to contact the pharmacy document the reason why and proceed with request.) (Agent: If yes, when and what did the pharmacy advise?)  This is the patient's preferred pharmacy:  [facility] [phone] [address], [place] - [address] [place] [address] Phone: [phone] Fax: [phone]  Is this the correct pharmacy for this prescription? Yes If no, delete pharmacy and type the correct one.   Has the prescription been filled recently? No  Is the patient out of the medication? Yes  Has the patient been seen for an appointment in the last year OR does the patient have an upcoming appointment? Yes  Can we respond through MyChart? Yes  Agent: Please be advised that Rx refills may take up to 3 business days. We ask that you follow-up with your pharmacy.

## 2024-06-12 DIAGNOSIS — E785 Hyperlipidemia, unspecified: Secondary | ICD-10-CM | POA: Diagnosis not present

## 2024-06-12 DIAGNOSIS — I358 Other nonrheumatic aortic valve disorders: Secondary | ICD-10-CM | POA: Diagnosis not present

## 2024-06-12 DIAGNOSIS — I10 Essential (primary) hypertension: Secondary | ICD-10-CM | POA: Diagnosis not present

## 2024-06-12 DIAGNOSIS — I2510 Atherosclerotic heart disease of native coronary artery without angina pectoris: Secondary | ICD-10-CM | POA: Diagnosis not present

## 2024-06-18 DIAGNOSIS — R3915 Urgency of urination: Secondary | ICD-10-CM | POA: Diagnosis not present

## 2024-06-18 DIAGNOSIS — Z125 Encounter for screening for malignant neoplasm of prostate: Secondary | ICD-10-CM | POA: Diagnosis not present

## 2024-06-18 DIAGNOSIS — N5239 Other post-surgical erectile dysfunction: Secondary | ICD-10-CM | POA: Diagnosis not present

## 2024-06-18 DIAGNOSIS — N400 Enlarged prostate without lower urinary tract symptoms: Secondary | ICD-10-CM | POA: Diagnosis not present

## 2024-07-09 ENCOUNTER — Encounter: Payer: Self-pay | Admitting: Family Medicine

## 2024-07-09 ENCOUNTER — Ambulatory Visit: Admitting: Family Medicine

## 2024-07-09 VITALS — BP 128/72 | HR 65 | Temp 97.7°F | Ht 70.0 in | Wt 197.8 lb

## 2024-07-09 DIAGNOSIS — Z23 Encounter for immunization: Secondary | ICD-10-CM | POA: Diagnosis not present

## 2024-07-09 DIAGNOSIS — I10 Essential (primary) hypertension: Secondary | ICD-10-CM

## 2024-07-09 DIAGNOSIS — E538 Deficiency of other specified B group vitamins: Secondary | ICD-10-CM | POA: Diagnosis not present

## 2024-07-09 DIAGNOSIS — K2270 Barrett's esophagus without dysplasia: Secondary | ICD-10-CM | POA: Diagnosis not present

## 2024-07-09 DIAGNOSIS — R739 Hyperglycemia, unspecified: Secondary | ICD-10-CM | POA: Diagnosis not present

## 2024-07-09 DIAGNOSIS — Z131 Encounter for screening for diabetes mellitus: Secondary | ICD-10-CM | POA: Diagnosis not present

## 2024-07-09 DIAGNOSIS — E785 Hyperlipidemia, unspecified: Secondary | ICD-10-CM | POA: Diagnosis not present

## 2024-07-09 MED ORDER — ROSUVASTATIN CALCIUM 20 MG PO TABS: 20.0000 mg | ORAL_TABLET | Freq: Every day | ORAL | 3 refills | Status: AC

## 2024-07-09 NOTE — Progress Notes (Signed)
 Phone [phone] In person visit   Subjective:   Jose [name] is a 83 y.o. year old very pleasant male patient who presents for/with See problem oriented charting Chief Complaint  Patient presents with   Hypertension   Hyperlipidemia   Medical Management of Chronic Issues    Past Medical History-  Patient Active Problem List   Diagnosis Date Noted   History of COVID-19.  Hospitalization November 2020 with acute respiratory failure. 07/09/2019    Priority: High   Aortic aneurysm 06/04/2019    Priority: High   Atherosclerosis of coronary artery 02/09/2007    Priority: High   Barrett's esophagus 02/09/2007    Priority: High   Vitamin B12 deficiency 12/17/2020    Priority: Medium    Fatty liver 06/04/2019    Priority: Medium    GERD (gastroesophageal reflux disease) 10/23/2018    Priority: Medium    Primary osteoarthritis of left knee 02/27/2018    Priority: Medium    Elevated PSA 08/03/2016    Priority: Medium    Benign prostatic hyperplasia with nocturia 05/27/2016    Priority: Medium    Essential hypertension 03/29/2014    Priority: Medium    Hyperglycemia 02/26/2013    Priority: Medium    Hyperlipidemia 02/09/2007    Priority: Medium    Aortic atherosclerosis 06/04/2019    Priority: Low   Acute prostatitis 02/21/2019    Priority: Low   Nasal congestion 10/23/2018    Priority: Low   Lumbosacral radiculopathy 06/17/2015    Priority: Low   Numbness 06/17/2015    Priority: Low   Right leg numbness     Priority: Low   Sinus bradycardia 03/19/2015    Priority: Low   Diverticulosis of colon with hemorrhage     Priority: Low   Actinic keratosis 08/07/2014    Priority: Low   Elevated blood pressure 03/29/2014    Priority: Low   OBESITY 03/20/2010    Priority: Low   Generalized anxiety disorder 08/22/2008    Priority: Low   ERECTILE DYSFUNCTION 08/22/2008    Priority: Low   Diverticulosis of large intestine 02/09/2007    Priority: Low   Aortic  valve sclerosis 05/10/2023   Murmur 09/14/2022   DOE (dyspnea on exertion) 01/15/2021   Dyslipidemia 12/28/2018   H/O heart artery stent 12/28/2018   Decreased range of motion of finger of right hand 12/13/2017   Decreased range of motion of left wrist 12/13/2017   Pain and swelling of left wrist 12/13/2017   Pain in finger of right hand 12/13/2017   Gastrointestinal hemorrhage 10/15/2014    Medications- reviewed and updated Current Outpatient Medications  Medication Sig Dispense Refill   aspirin  EC 81 MG tablet Take 81 mg by mouth daily. Swallow whole.     omeprazole  (PRILOSEC) 40 MG capsule Take 1 capsule (40 mg total) by mouth daily. 90 capsule 3   rosuvastatin  (CRESTOR ) 20 MG tablet Take 1 tablet (20 mg total) by mouth daily. 90 tablet 3   tadalafil  (CIALIS ) 20 MG tablet TAKE ONE-HALF (1/2) TABLET DAILY AS NEEDED FOR ERECTILE DYSFUNCTION 90 tablet 3   tamsulosin (FLOMAX) 0.4 MG CAPS capsule Take 0.4 mg by mouth daily.     traMADol  (ULTRAM ) 50 MG tablet Take 1-2 tablets (50-100 mg total) by mouth 2 (two) times daily as needed for severe pain (pain score 7-10) (do not drive 8 hours after taking). 60 tablet 5   No current facility-administered medications for this visit.     Objective:  BP 128/72 (BP Location: Left Arm, Patient Position: Sitting, Cuff Size: Normal)   Pulse 65   Temp 97.7 F (36.5 C) (Temporal)   Ht 5' 10 (1.778 m)   Wt 197 lb 12.8 oz (89.7 kg)   SpO2 94%   BMI 28.38 kg/m  Gen: NAD, resting comfortably CV: RRR no murmurs rubs or gallops Lungs: CTAB no crackles, wheeze, rhonchi Ext: trace edema Skin: warm, dry     Assessment and Plan   #CAD-followed in [place] with history of stent- remains on aspirin  81 mg #Hyperlipidemia  Ideal LDL under 70 s: Medication: Rosuvastatin  20 mg, aspirin  81 mg Lab Results  Component Value Date   CHOL 105 01/06/2024   HDL 40 01/06/2024   LDLCALC 48 01/06/2024   LDLDIRECT 100.0 12/19/2017   TRIG 91 01/06/2024    CHOLHDL 2.6 01/06/2024  -no chest pain or shortness of breath   A/P:coronary artery disease asymptomatic continue current medications  Lipids at goal - continue current medications    #Hyperglycemia-peak A1c 5.8 s: Diet controlled in the past  - some limited exercise but at least stays active. Feels could improve diet Lab Results  Component Value Date   HGBA1C 5.8 07/07/2023   HGBA1C 5.7 12/23/2022   HGBA1C 5.7 06/22/2022  A/P: hopefully stable- update a1c today. Continue without meds for now    #Left knee pain S: History of left knee replacement-with continued pain in the left knee despite this patient using 1 tramadol  in the morning and 1 in the evening for his knees.  We received a letter from [facility] in 2020 requesting that we begin his long-term tramadol  prescription. Reasonable control -some hand arthritis as well- Voltaren  gel helping A/P:knee pain doing ok with ongoing tramadol - refill every 6 months   #GERD/Barrett's esophagus- q3 years endoscopy- July 2022 last  S: doing well on omeprazole  40mg  per Dr. aneita. Only issue he gets is some phlegm clearing- discussed could also be allergy related.  A/P: reflux well controlled continue current medications. Barrett-s now overdue for q3 colonoscopy and I don't see him on recall list- will send referral to gastroenterology   # Fatty liver S:  Noted on CT 04/20/2019.  Lab Results  Component Value Date   ALT 13 01/06/2024   AST 17 01/06/2024   ALKPHOS 75 07/07/2023   BILITOT 0.8 01/06/2024  A/P: has looked good on labs- encouraged healthy eating and regular exercise    # Aortic aneurysm- stable July 2024 with 2 year repeat  #Elevated PSA-following up with urology yearly at least. They monitor PSA - BPH likely- Flomax helping some through urology  Recommended follow up: Return in about 6 months (around 01/06/2025) for followup or sooner if needed.Schedule b4 you leave. Future Appointments  Date Time Provider Department  Center  02/25/2025  1:40 PM [facility] ANNUAL WELLNESS VISIT 1 [facility]   Lab/Order associations:   ICD-10-CM   1. Essential hypertension  I10 Comprehensive metabolic panel with GFR    CBC with Differential/Platelet    2. Hyperlipidemia, unspecified hyperlipidemia type  E78.5 Comprehensive metabolic panel with GFR    CBC with Differential/Platelet    3. Vitamin B12 deficiency  E53.8 Vitamin B12    4. Hyperglycemia  R73.9 Hemoglobin A1c    5. Screening for diabetes mellitus  Z13.1 Hemoglobin A1c    6. Barrett's esophagus without dysplasia  K22.70 Ambulatory referral to Gastroenterology    7. Immunization due  Z23 Flu vaccine HIGH DOSE PF(Fluzone Trivalent)  No orders of the defined types were placed in this encounter.   Return precautions advised.  Garnette Lukes, MD

## 2024-07-09 NOTE — Patient Instructions (Addendum)
 [facility] contact about endoscopy for barrett's  Please call to schedule visit and/or procedure IF you do not hear within a week Address: [address] [place], [place], [place] [address] Phone: [phone]   Could try coenzyme q10 or coQ10  Please stop by lab before you go If you have mychart- we will send your results within 3 business days of us  receiving them.  If you do not have mychart- we will call you about results within 5 business days of us  receiving them.  *please also note that you will see labs on mychart as soon as they post. I will later go in and write notes on them- will say notes from Dr. Katrinka   Recommended follow up: Return in about 6 months (around 01/06/2025) for followup or sooner if needed.Schedule b4 you leave.

## 2024-07-10 ENCOUNTER — Ambulatory Visit: Payer: Self-pay | Admitting: Family Medicine

## 2024-07-10 LAB — COMPREHENSIVE METABOLIC PANEL WITH GFR
ALT: 14 U/L (ref 0–53)
AST: 18 U/L (ref 0–37)
Albumin: 4.1 g/dL (ref 3.5–5.2)
Alkaline Phosphatase: 71 U/L (ref 39–117)
BUN: 13 mg/dL (ref 6–23)
CO2: 26 meq/L (ref 19–32)
Calcium: 9.1 mg/dL (ref 8.4–10.5)
Chloride: 107 meq/L (ref 96–112)
Creatinine, Ser: 1.07 mg/dL (ref 0.40–1.50)
GFR: 65.37 mL/min (ref >60.00)
Glucose, Bld: 73 mg/dL (ref 70–99)
Potassium: 3.9 meq/L (ref 3.5–5.1)
Sodium: 140 meq/L (ref 135–145)
Total Bilirubin: 0.7 mg/dL (ref 0.2–1.2)
Total Protein: 6.8 g/dL (ref 6.0–8.3)

## 2024-07-10 LAB — CBC WITH DIFFERENTIAL/PLATELET
Basophils Absolute: 0 K/uL (ref 0.0–0.1)
Basophils Relative: 0.7 % (ref 0.0–3.0)
Eosinophils Absolute: 0.1 K/uL (ref 0.0–0.7)
Eosinophils Relative: 2 % (ref 0.0–5.0)
HCT: 41.5 % (ref 39.0–52.0)
Hemoglobin: 14.2 g/dL (ref 13.0–17.0)
Lymphocytes Relative: 23.5 % (ref 12.0–46.0)
Lymphs Abs: 1.1 K/uL (ref 0.7–4.0)
MCHC: 34.2 g/dL (ref 30.0–36.0)
MCV: 82.6 fl (ref 78.0–100.0)
Monocytes Absolute: 0.8 K/uL (ref 0.1–1.0)
Monocytes Relative: 18.2 % — ABNORMAL HIGH (ref 3.0–12.0)
Neutro Abs: 2.5 K/uL (ref 1.4–7.7)
Neutrophils Relative %: 55.6 % (ref 43.0–77.0)
Platelets: 175 K/uL (ref 150.0–400.0)
RBC: 5.02 Mil/uL (ref 4.22–5.81)
RDW: 15.6 % — ABNORMAL HIGH (ref 11.5–15.5)
WBC: 4.5 K/uL (ref 4.0–10.5)

## 2024-07-10 LAB — HEMOGLOBIN A1C: Hgb A1c MFr Bld: 5.7 % (ref 4.6–6.5)

## 2024-07-10 LAB — VITAMIN B12: Vitamin B-12: 220 pg/mL (ref 211–911)

## 2024-07-12 NOTE — Progress Notes (Signed)
 Spoke with patient about labs. Patient expressed understanding.

## 2024-09-06 ENCOUNTER — Ambulatory Visit (HOSPITAL_COMMUNITY): Admission: EM | Admit: 2024-09-06 | Discharge: 2024-09-06 | Disposition: A | Discharge status: Home / Self Care

## 2024-09-06 NOTE — ED Notes (Signed)
 Added by error - disregard

## 2024-09-09 ENCOUNTER — Other Ambulatory Visit: Payer: Self-pay | Admitting: Family Medicine

## 2024-10-02 ENCOUNTER — Encounter: Payer: Self-pay | Admitting: Gastroenterology

## 2024-10-02 ENCOUNTER — Ambulatory Visit: Admitting: Gastroenterology

## 2024-10-02 VITALS — BP 140/58 | HR 70 | Ht 70.0 in | Wt 207.4 lb

## 2024-10-02 DIAGNOSIS — K2270 Barrett's esophagus without dysplasia: Secondary | ICD-10-CM | POA: Diagnosis not present

## 2024-10-02 DIAGNOSIS — R1319 Other dysphagia: Secondary | ICD-10-CM

## 2024-10-02 DIAGNOSIS — K219 Gastro-esophageal reflux disease without esophagitis: Secondary | ICD-10-CM

## 2024-10-02 DIAGNOSIS — R1310 Dysphagia, unspecified: Secondary | ICD-10-CM | POA: Diagnosis not present

## 2024-10-08 ENCOUNTER — Encounter: Admitting: Pediatrics

## 2025-01-08 ENCOUNTER — Ambulatory Visit: Admitting: Family Medicine

## 2025-02-25 ENCOUNTER — Ambulatory Visit
